# Patient Record
Sex: Female | Born: 1945 | Race: White | Hispanic: No | Smoking: Former smoker
Health system: Southern US, Community
[De-identification: ages and names within clinical notes are randomized; demographics above are authoritative.]

## PROBLEM LIST (undated history)

## (undated) DIAGNOSIS — I313 Pericardial effusion (noninflammatory): Secondary | ICD-10-CM

## (undated) DIAGNOSIS — E785 Hyperlipidemia, unspecified: Secondary | ICD-10-CM

## (undated) DIAGNOSIS — I3139 Other pericardial effusion (noninflammatory): Secondary | ICD-10-CM

## (undated) DIAGNOSIS — A419 Sepsis, unspecified organism: Secondary | ICD-10-CM

## (undated) DIAGNOSIS — I5032 Chronic diastolic (congestive) heart failure: Secondary | ICD-10-CM

## (undated) DIAGNOSIS — G894 Chronic pain syndrome: Secondary | ICD-10-CM

## (undated) DIAGNOSIS — G8250 Quadriplegia, unspecified: Secondary | ICD-10-CM

## (undated) DIAGNOSIS — R251 Tremor, unspecified: Secondary | ICD-10-CM

## (undated) DIAGNOSIS — E119 Type 2 diabetes mellitus without complications: Secondary | ICD-10-CM

## (undated) DIAGNOSIS — Z87440 Personal history of urinary (tract) infections: Secondary | ICD-10-CM

## (undated) DIAGNOSIS — I4891 Unspecified atrial fibrillation: Secondary | ICD-10-CM

## (undated) DIAGNOSIS — Z8619 Personal history of other infectious and parasitic diseases: Secondary | ICD-10-CM

## (undated) DIAGNOSIS — R918 Other nonspecific abnormal finding of lung field: Secondary | ICD-10-CM

## (undated) DIAGNOSIS — I10 Essential (primary) hypertension: Secondary | ICD-10-CM

## (undated) DIAGNOSIS — L89899 Pressure ulcer of other site, unspecified stage: Secondary | ICD-10-CM

## (undated) DIAGNOSIS — K219 Gastro-esophageal reflux disease without esophagitis: Secondary | ICD-10-CM

## (undated) HISTORY — PX: ABDOMINAL HYSTERECTOMY: SHX81

---

## 2003-11-15 ENCOUNTER — Encounter: Admission: RE | Admit: 2003-11-15 | Discharge: 2004-02-13 | Payer: Self-pay | Admitting: Emergency Medicine

## 2003-12-12 ENCOUNTER — Inpatient Hospital Stay (HOSPITAL_COMMUNITY): Admission: EM | Admit: 2003-12-12 | Discharge: 2003-12-17 | Payer: Self-pay | Admitting: *Deleted

## 2004-03-06 ENCOUNTER — Encounter: Admission: RE | Admit: 2004-03-06 | Discharge: 2004-06-04 | Payer: Self-pay | Admitting: Emergency Medicine

## 2004-06-25 ENCOUNTER — Encounter: Admission: RE | Admit: 2004-06-25 | Discharge: 2004-07-25 | Payer: Self-pay | Admitting: Emergency Medicine

## 2005-06-17 ENCOUNTER — Encounter: Admission: RE | Admit: 2005-06-17 | Discharge: 2005-09-15 | Payer: Self-pay | Admitting: Family Medicine

## 2007-03-13 ENCOUNTER — Inpatient Hospital Stay (HOSPITAL_COMMUNITY): Admission: EM | Admit: 2007-03-13 | Discharge: 2007-03-18 | Payer: Self-pay | Admitting: Emergency Medicine

## 2007-03-13 ENCOUNTER — Ambulatory Visit: Payer: Self-pay | Admitting: *Deleted

## 2007-08-11 ENCOUNTER — Encounter (HOSPITAL_BASED_OUTPATIENT_CLINIC_OR_DEPARTMENT_OTHER): Admission: RE | Admit: 2007-08-11 | Discharge: 2007-11-08 | Payer: Self-pay | Admitting: Internal Medicine

## 2010-01-08 ENCOUNTER — Encounter: Admission: RE | Admit: 2010-01-08 | Discharge: 2010-04-03 | Payer: Self-pay | Admitting: Family Medicine

## 2010-06-25 ENCOUNTER — Encounter: Admit: 2010-06-25 | Payer: Self-pay | Admitting: Family Medicine

## 2010-11-19 NOTE — Assessment & Plan Note (Signed)
[facility]   NAME:  [name] [name], [name] [name]                ACCOUNT NO.:  [id number]   MEDICAL RECORD NO.:  [id number]      DATE OF BIRTH:  [birth year]   PHYSICIAN:  Theresia Majors. Tanda Rockers, M.D.      VISIT DATE:                                   OFFICE VISIT   REASON FOR CONSULTATION:  [name] [name] is a 65 year old lady referred by  Dr. Wonda Horner from [facility] for evaluation of bilateral  fifth toe ulcerations.   IMPRESSION:  Bilateral neuropathic ulcers.   RECOMMENDATIONS:  Topical Cadexomer Iodine followed by fitting for  custom orthotics for inserts and shoes.   SUBJECTIVE:  [name] [name] is a 65 year old lady who reports that she has  had progressive ulcerations over the fifth digits of both feet for the  past 2 months.  She has treated them with local antibiotics and  antiseptic soap wash.  She has never worn custom orthotics.  She has  never worn healing sandals.  Two weeks ago she changed her wound  management to use an aloe vera juice and has noted some improvement.  There has been no interim fever.  There has been no malodorous drainage.  She continues to be ambulatory.   ALLERGIES:  No known drug allergies.   MEDICATIONS:  Her current medication list includes:  Neurontin 300 mg  twice daily, baclofen 20 mg t.i.d. Dantrium 25 mg,  Keflex XR 75 mg 3  tablets at night, lorazepam 1 mg q.i.d., Premarin, OxyContin 40 mg four  b.i.d. and OxyIR 5 mg two p.o. q.6 h p.r.n. for breakthrough pain,  spirolactone 25 mg two b.i.d., Centrum Silver daily torsemide 100 mg 1/2  daily, aspirin 81 mg daily, potassium chloride 20 mEq t.i.d., Zaroxolyn  5 mg in the morning,  Mag-Ox 40 mg b.i.d. and Zetia 10 mg h.s.   PAST SURGICAL HISTORY:  Her previous surgery has included cervical  fusion in 1993, hysterectomy 29 years ago and appendectomy in 1994.   FAMILY HISTORY:  Her family history is unknown. The patient was adopted.   SOCIAL HISTORY:  She is medically disabled.  She  used to be employed as  an Management consultant.   REVIEW OF SYSTEMS:  On review of systems she reports significant anxiety  and spasticity related to a previous spinal cord injury.  She has  chronic pain related to her previous trauma.  She specifically denies  transient visual loss or upper extremity paralysis or stigmata of TIAs.  She is a reformed smoker.  She denies a chronic cough or hemoptysis.  She  specifically denies angina pectoris. Her bowel and bladder function  are unremarkable.  Her weight has been stable.  Appetite is good.  There  is no polydipsia, polyuria or polyphagia. The remainder of the review of  systems is negative.   PHYSICAL EXAMINATION:  GENERAL:  On physical exam she is an alert,  oriented female verbally spontaneous in no acute distress.  VITAL SIGNS:  Blood pressure is 127/68, respirations 16, pulse rate 80,  temperature is 98.2  HEENT: Examination  is clear.  NECK:  The neck is supple.  Trachea is midline.  Thyroid is nonpalpable.  LUNGS:  Clear.  HEART:  Heart sounds were distant.  ABDOMEN:  Abdomen is soft without masses.  HEART:  The femoral pulses are 2+ the pedal pulses are 3+.  There is  trace edema. At the distal interphalangeal joint of the fifth digits  bilaterally are abrasive appearing ulcerations which extend down into  the subcutaneous tissue.  There is no joint exposed only the wound.  There is no drainage.  There is no evidence of a ascending infection,  cellulitis or abscess formation.  Both wounds were photographed,  measured, and cataloged.  Please refer to the data entries. The patient  is absolutely insensate to the wine stain filament.  She has decreased  motor ability in the lower extremities with weakness in the major  abductors and the adductors.  There is some weakness in extension of the  lower extremity.  There is weakness in the extension and flexion of the  ankle.   DISCUSSION:  [name] [name] physical exam is consistent  with significant  neurological deficits in the lower extremity.  She is particularly  insensate in the areas corresponding to the ulcerations.  We have  explained the etiology of her ulcers as definitely related to the  insensate nature of her lower extremities.  We have recommended custom  orthotics.  In the interim, we will treat her with Cadexomer iodine  compound to avoid secondary infection.  We have encouraged her to  continue her level of activity.  We have also asked her not to wear  deterrent shoes, but to wear the healing sandals which we have fitted  her for in the clinic.  We have given an opportunity to ask questions.  She seems to understand and indicates that she will be compliant.  We  will reevaluate her in 1 week      Harold A. Tanda Rockers, M.D.  Electronically Signed     HAN/MEDQ  D:  08/18/2007  T:  08/20/2007  Job:  [id number]   cc:   Volanda Napoleon, MD

## 2010-11-19 NOTE — Assessment & Plan Note (Signed)
[facility]   NAME:  KIMANI, BEDOYA                ACCOUNT NO.:  [id number]   MEDICAL RECORD NO.:  [id number]      DATE OF BIRTH:  29-Jun-1946   PHYSICIAN:  Theresia Majors. Tanda Rockers, M.D. VISIT DATE:  08/26/2007                                   OFFICE VISIT   SUBJECTIVE:  [name] [name] is a 65 year old lady who we have followed for  neuropathic ulcers.  In the interim we have treated her with offloading  healing sandals and topical our sore.  She reports that there is been no  excessive drainage or malodor.  She continues to go barefoot in her  home.  There has been no interim injury.   EXAM:  Blood pressure is 142/71, respirations 18, pulse rate 96,  temperature 98.  Inspection of the feet shows that the ulcers have contracted nicely.  There is no exudate.  There are areas of impacted Iodosorb the at the  erythema has decreased markedly.   ASSESSMENT:  Clinical improvement with offloading and the use of topical  our sore.   PLAN:  We will continue the our sort topical ointment and offloading.  We will reevaluate the patient in 3 weeks.  We have emphasized the need  to wear protective foot wear.  We have given the patient an opportunity  to ask questions.  She seems to understand indicates that she will be  compliant.      Harold A. Tanda Rockers, M.D.  Electronically Signed     HAN/MEDQ  D:  08/26/2007  T:  08/27/2007  Job:  [id number]

## 2010-11-19 NOTE — Assessment & Plan Note (Signed)
[facility]   NAME:  [name] [name], [name] [name]                ACCOUNT NO.:  [id number]   MEDICAL RECORD NO.:  [id number]      DATE OF BIRTH:  [birth year]   PHYSICIAN:  Theresia Majors. Tanda Rockers, M.D. VISIT DATE:  10/21/2007                                   OFFICE VISIT   SUBJECTIVE:  [name] is a 65 year old lady with bilateral neuropathic  ulcers involving the feet.  In the interim, she has procured custom  orthotics and shoes.  She has not worn the shoes in her followup  evaluation.  She continues to wear the healing sandal and apply Iodosorb  every 3 days to the residual wounds.  There has been no drainage.  There  has been no fever or pain.   OBJECTIVE:  Blood pressure is 131/75, respirations 16, pulse rate 85,  and temperature is 98.1.  Inspection of the lower extremities shows that  there is no edema.  The pedal pulse remains readily palpable.  On the  left foot at the fifth lateral toe is a minuscule punctate area with no  drainage.  There is no extension into the subcutaneous tissue.  There is  some moderate amount of desquamation, but no debridement is needed.  Over the right fifth lateral toe is a similar appearing wound with a  dry, well-adherent eschar.  There is no evidence of infection or  ischemia.   ASSESSMENT:  Improved neuropathic ulcers.   PLAN:  We have instructed the patient to begin using her custom  orthotics with the shoes.  She is to wear them for increasing periods  with interval inspection of the foot to rule out injury.  If she notices  any injury, she is to discontinue the shoe and return to the healing  sandals, call the clinic as well as the orthotists for an interim  appointment.  We have given the patient the opportunity to ask  questions.  She seems to understand these instructions and indicates  that she will be compliant.  We will re-evaluate her in 2 weeks.      Harold A. Tanda Rockers, M.D.  Electronically Signed     HAN/MEDQ  D:   10/21/2007  T:  10/22/2007  Job:  [id number]

## 2010-11-19 NOTE — Assessment & Plan Note (Signed)
[facility]   NAME:  [name] [name], [name] [name]                ACCOUNT NO.:  [id number]   MEDICAL RECORD NO.:  [id number]      DATE OF BIRTH:  1946/03/05   PHYSICIAN:  Jonelle Sports. Sevier, M.D.  VISIT DATE:  10/06/2007                                   OFFICE VISIT   HISTORY:  This 65 year old white female with a spinal cord injury and  neuropathic feet has been followed for footwear-induced ulcerations of  the dorsolateral fifth toes bilaterally.  These have shown progressive  improvement, and she has most recently been managed simply with daily  cleansing and with every 3-day application of Iodosorb ointment and has  remained in healing sandals bilaterally.   She reports that since the last visit she has had no significant pain,  no drainage, no odor, no swelling, no fever or systemic symptoms.   Blood pressure 118/67, pulse 74, respirations 16, temperature 97.7.  The  wound on the right fifth toe dorsolaterally measures 0.7 x 0.2 x 0.2 cm  in dimension and appears to be satisfactorily healing from its base with  no evidence of infection or other problems.   The wound on the dorsolateral aspect of the left fifth toe is scabbed  over and measures 0.3 x 0.3 x 0.1 cm.  There is no spreading  inflammation or any signs of problems beneath this eschar.   IMPRESSION:  Progressive improvement, bilateral neuropathic foot ulcers.   DISPOSITION:  The wounds require no debridement today.  The patient is  instructed to continue her current regimen of care, namely cleansing  daily and every 3 days to make an application of Iodosorb ointment.  She  is to remain in the healing sandals bilaterally at all times.   The patient has had custom shoes prepared by [employer], and we will make a  call to them today to see if they can possibly release those to her  prior to her next visit here so that we may assess them here at that  time.  That visit will be 2 weeks hence.     ______________________________  Jonelle Sports. Cheryll Cockayne, M.D.     RES/MEDQ  D:  10/06/2007  T:  10/06/2007  Job:  [id number]

## 2010-11-19 NOTE — Assessment & Plan Note (Signed)
[facility]   NAME:  [name] [name]                ACCOUNT NO.:  [id number]   MEDICAL RECORD NO.:  [id number]      DATE OF BIRTH:  06-12-1946   PHYSICIAN:  Theresia Majors. Tanda Rockers, M.D. VISIT DATE:  09/16/2007                                   OFFICE VISIT   SUBJECTIVE:  [name] [name] returns for followup of neuropathic ulcers  involving both lower extremities.  She has been applying topical  Iodosorb every 2-3 days up and wearing white socks with offloading  healing sandals.  There has been no excessive drainage, malodor, pain or  fever.   OBJECTIVE:  VITAL SIGNS:  Blood pressure is 119/74, respirations 16,  pulse rate 91, temperature is 98.4.  EXTREMITIES:  Inspection of the lower extremity shows that there has  been contraction of both wounds #1 and #2.  There is no drainage.  The  scabs are well adherent and there has been definite contraction of the  wounds.  The pedal pulses remain palpable.  There is no evidence of  ascending infection.   ASSESSMENT:  Clinical improvement.   PLAN:  We have given the patient a prescription for extra-depth shoes  and inserts.  She also has been referred to the orthotist for fitting of  the same.  She has been encouraged to continue to apply the Iodosorb  every 3-4 days and continue wearing the healing sandal.  We will re-  evaluate her in 2 weeks.  We anticipate that she will have procured her  custom orthotics at her next visit.  We have given the patient  opportunity to ask questions.  She seems to understand the instructions  and indicates that she will be compliant. /      Theresia Majors. Tanda Rockers, M.D.  Electronically Signed     HAN/MEDQ  D:  09/16/2007  T:  09/17/2007  Job:  [id number]

## 2010-11-19 NOTE — Assessment & Plan Note (Signed)
[facility]   NAME:  [name] [name], [name] [name]                ACCOUNT NO.:  [id number]   MEDICAL RECORD NO.:  [id number]      DATE OF BIRTH:  10/08/1945   PHYSICIAN:  Theresia Majors. Tanda Rockers, M.D. VISIT DATE:  11/04/2007                                   OFFICE VISIT   SUBJECTIVE:  [name] [name] is a 65 year old lady who we have seen for  several weeks for the management of neuropathic ulcers involving her  feet.  In the interim, she has been treated with compression topical  Iodosorb and most recently she has received custom inserts to shoes.  She has been wearing though shoes without incident.  There has been no  drainage or pain.  She returns for followup.   OBJECTIVE:  VITAL SIGNS:  Blood pressure is 134/65, respirations 16,  pulse rate 82, temperature 98.5.  She is accompanied by her sister.  Inspection of the lower extremity shows trace edema.  The areas of  previous ulceration have completely re-epithelialized and/or covered by  firm adherent eschars.  There is no evidence of undue pressure.  The  pedal pulses remain palpable.  The patient has lost her protective  sensation is judged by the Semmes-Weinstein monofilament.  There is no  evidence of acute infection.   ASSESSMENT:  Resolved neuropathic ulcers.   PLAN:  The patient should continue the wear of the custom orthotics in  shoes.  We have recommended strongly that she develop a relationship  with a local podiatrist for routine foot care.  We have given her an  opportunity to ask questions.  She seems to understand these  instructions and indicates that she will be compliant.  She is  discharged from active management via the wound center and encouraged to  make her routine appointments with her primary care physician Dr. Wonda Horner, of [facility].      Harold A. Tanda Rockers, M.D.  Electronically Signed     HAN/MEDQ  D:  11/04/2007  T:  11/05/2007  Job:  [id number]

## 2010-11-22 NOTE — Discharge Summary (Signed)
NAMEJERMIAH, [name] [name]                ACCOUNT NO.:  [id number]   MEDICAL RECORD NO.:  [id number]          PATIENT TYPE:  INP   LOCATION:  5025                         FACILITY:  [facility]   PHYSICIAN:  Valetta Close, M.D.   DATE OF BIRTH:  [birth year]   DATE OF ADMISSION:  03/13/2007  DATE OF DISCHARGE:  03/18/2007                               DISCHARGE SUMMARY   Date of Admission 03/13/07  Date of Discharge 03/18/07  Attending Physician Dr. Lyda Perone   DISCHARGE DIAGNOSIS:  Urinary tract infection that caused her  disorientation and change in mental status.  Extensive back surgeries up to 7,  hypertension,  anxiety,  depression,  COPD,  hyperlipidemia  chronic lower extremity edema.  Hypomagnesemia  Chronic Pain  Oxycontin Dependence with opioid withdrawal during hospitalization  Hypokalemia  Hypophophatemia   DISCHARGE MEDICATIONS:  1. Aspirin 81 mg po qd.  2. Magnesium oxide 400 mg p.o. b.i.d.  3. Effexor XR 75 mg once at bedtime.  4. Lorazepam 1 mg x4 daily.  5. Baclofen 20 mg t.i.d.  6. OxyContin IR 100 mg po b.i.d.  7. Oxycodone 10mg  po q 6 hours prn pain   Disposition at Discharge  She was instructed to followup with her PCP Dr. Remer Macho to work up her  LFTs since they were not terribly elevated and she was asymptomatic.  The patient was told to have her PCP check for autoimmune causes with an  antismooth muscle and also get a transferrin saturation level in an  effort to further work up these elevated LFTs.  Concerning her Oxycontin dependence, she is wanting to decrease her  physical and psychological dependence on oxycontin and she was provided  information on addiction programs.  She can also follow up on this with  Dr. Remer Macho.  [name] [name] has never abused her medicine, she is just got  to the point where she is on a very high dose and she would like to  break her reliance on oxycontin.  Her electrolyte deficiencies were a problem during her hospitalization.  Off of her  diuretics her blood pressure was okay, and her edema remained  at baseline.  She is discharged off of her diuretics and can follow up  on her electrolytes with Dr. Remer Macho.   HISTORY OF PRESENT ILLNESS:  Additional acute problems upon admission  were chronically low potassium, hypertension, elevated LFTs,  hyperlipidemia, anxiety, chronic back pain.  The patient was discharged  on aspirin 81 mg p.o. once daily, magnesium oxide 400 mg p.o. b.i.d.,  Lorazepam 1 mg once every 6 hours as needed for anxiety, Effexor 25 mg  p.o. once at night.  This is a new lower dose.  Baclofen 20 mg p.o. 3  times daily, OxyContin 100 mg p.o. twice daily, Oxycodone 10 mg by mouth  every 6 hours as needed for break through pain.  The patient was told to  make an appointment with her primary care physician, Dr. Benancio Deeds of  [place], [place] within 1 to 2 weeks.  Dr. Benancio Deeds was called by  internal medicine but was never successfully reached.  On  this  appointment the patient was instructed to followup on her fasting lipid  panel, on her elevated LFTs.  She was told she required a comprehensive  metabolic panel including liver functions, potassium, magnesium and  phosphorus.  The patient was instructed that she needed her  hypophosphatemia worked up by her PCP.  The patient was also told at  this appointment that she needed her transferrin and smooth muscle  antibodies checked to rule out hemachromatosis and any autoimmune reason  for her elevated LFTs.  Upon admission the patient had a head CT without  contrast that was negative.  A chest x-ray that was also negative.  During the course of her stay she had a subsequent chest x-ray to check  for any infiltrates or infection when she spiked a fever.  The chest x-  ray was negative.  The patient also received a CT with contrast of her  abdomen and pelvis to investigate her elevated LFTs.  The patient also  received an ultrasound of her abdomen.  Her ultrasound  was normal.  Her  CT with contrast showed small possible liver cysts, judged by radiology  to be noncontributory.  No pancreatic inflammation was seen on CT.  The  patient was admitted on March 13, 2007 per her son with altered  mental status.  The patient was last seen with normal mentation on the  day prior to admission.  She was noted to be very sleepy and slept most  of the day prior to admission.  When her son encountered her on the day  of admission around 11:00 a.m. she seemed very confused and became  increasingly altered over the next few hours.  Her son called EMS after  his suggestion of going to the hospital was met with the comment that  she feared she would die if she went to the hospital.  The patient's son  did recall that she tripped and fell on September 2 or 3, 2008 but  denies any blows to the head.   PHYSICAL EXAMINATION:  VITAL SIGNS:  Temperature of 98.4, blood pressure  of 108/91, pulse of 99, respiratory rate 22, saturation 98% on 2 liters  of oxygen.  GENERAL:  The patient was very disoriented with occasional full body  twitches and complaining of things in her legs on admission.  HEENT:  Eyes were PERRLA and nonicteric.  Head showed no evidence of  trauma.  NECK:  No masses are palpated in the neck.  LUNGS:  The patient was uncooperative for a respiratory exam, though her  breath sounds in her upper lobes bilaterally were clear.  HEART:  The patient had a regular rate and rhythm.  No murmurs, rubs or  gallops appreciated.  ABDOMEN:  She had positive bowel sounds.  Nontender and nondistended  with no CVA tenderness.  EXTREMITIES:  The patient had no peripheral edema.  SKIN:  No rashes.  NEURO:  The patient was alert and oriented to self only.  Uncooperative  and unable to test cranial nerves or strength.  PSYCH:  The patient was hallucinating and occasionally combative.   ADMISSION LABS:  Sodium 136, potassium of 2.7. chloride of 101, bicarb  of 26, BUN  of 16 and a glucose of 99.  The patient's hemoglobin on  admission was 15.3 and hematocrit was 48.  For a more detailed H&P please refer to the chart   HOSPITAL COURSE BY SYSTEM:  1. Altered mental status.  The patient's UA was positive for  nitrites      and many bacteria.  She was begun on Cipro 400 mg IV b.i.d. to      treat a UTI.  UDS was positive for benzos and opiates, likely      administered in the ED and also to be accounted for by her home      drug regimen.  CT without contrast of the head was negative.  Her      chest x-ray was unremarkable.  Her blood alcohol level was      negative.  Her TSH was within normal limits.  RPR was negative.      B12 was 1081, actually above normal limits.  Her folate was greater      than 20 which was within normal limits.  Her acetaminophen level      was 18.2 and her ESR was normal.  With treatment of her UTI her AMS      cleared dramatically and rapidly, and thus with her other work up      being negative, her AMS was attributed to her UTI  2. Her UTI was judged to be responsible for her altered mental status      based on her altered mental status resolving upon treatment of the      UTI.  It returned senstive to Cipro, and because of her complicated      course, she was maintained on Cipro during her hospitalization,      with a switch to PO when she her AMS resolved, and continued for a      total of 14 days on d/c.  3. Low potassium.  We were informed that this is a chronic problem for      the patient.  During our stay with [place] she was regularly repleted      with K-Dur as well as magnesium oxide.  She maintained normal      levels.  Further work up was suggested as an outpatient with her      PCP.  We did however decide to stop her diuretics as they were also      likely involved with her low electrolytes.  4. Hypertension.  We held all of the patient's hypertension      medications.  She maintained a stable BP, during her entire       hospital course.  We did not restart any of her diuretics and left      that up to her PCP.  I would favor not being quick to restart her      diuretic medications.  5. Hyperlipidemia.  The patient's fasting lipid panel showed a      triglyceride level of 152, HDL level of 32 and an LDL of 124.      These were forwarded on to her PCP and the patient was instructed      to followup with her primary care physician about possible start of      a statin or some other cholesterol regimen.  With her mild      elevation in LFTs, we did not rush to start her on statins while in      the hospital.  6. Pain control.  The patient was begun on admission on a very minimal      dose of OxyContin 20 mg twice a day, beginning on the evening of      March 15, 2007 and continuing to the day of March 16, 2007.  The patient began to suffer nausea, vomiting and spiked a fever.      We found out that her home dose of OxyContin was 160 mg b.i.d.  Her      OxyContin dose was increased to 100 mg b.i.d.  Nausea, vomiting and      fever all resolved.  Blood cultures in regard to this fever were      taken and all came back negative.  A urine culture for this fever      was taken and also came back negative.  After the change in her      OxyContin dose, the patient said that her pain control was      adequate.  She also requested information on rehab program feeling      that she was psychologically and physically dependent on her      OxyContin dose having been on such a high dose for so long.  Social      work did see the patient and gave her information on this.  7. Elevated LFTs.  The day after admission, which would be September      7, the patient's AFT was 72 and ALT was 48.  These continued to      escalate so that on the day of her discharge, September 11, her AFT      was 69 and her ALT was 86.  It working up these elevated liver      function tests the patient and her family were repeatedly  asked      about any possible alcohol use and in every incident alcohol use      was denied.  Her alcohol blood level on admission was negative.  On      September 10, the patient's belly seemed very swollen possibly with      fluid.  A hepatitis panel was run.  She was negative for hep A, B      and C.  An ultrasound of her belly was done on September 11, that      came back normal.  A CT scan with contrast was done to evaluate her      liver, pancreas and also any possible gynecological sources for      her.  Her CT with contrast was normal with small liver cysts judged      by radiology to be noncontributory.  No pancreatic inflammation      seen.  The patient had had a hysterectomy.  This was      noncontributory so pelvic was negative.  By September 11, the      patient's abdomen had returned to normal size per her.  This should      be followed up by her PCP for potential immunological causes of      liver disease.   LABORATORY DATA:  On the day of discharge the patient's labs were sodium  142, potassium 3.2, chloride of 106, bicarb of 25, BUN of 10, creatinine  of 0.92 and a glucose of 82.  White blood cell count was 11.5,  hemoglobin was 14, hematocrit was 41.8 with platelets of 236.  Total  bilirubin of 0.6, alk phos of 88, AST of 74 and ALT of 86.  Total  protein is 6.3, albumin is 3.4, calcium of 8.5 and a lipase of 127.   VITAL SIGNS:  The patient's vitals on the day of discharge were  temperature of 99, blood pressure was 129/68,  pulse of 58, respirations  of 20, saturation 98% on room air.      Valetta Close, M.D.  Electronically Signed     JC/MEDQ  D:  03/23/2007  T:  03/23/2007  Job:  [id number]

## 2010-11-22 NOTE — H&P (Signed)
[name] [name], [name] [name]                            ACCOUNT NO.:  [id number]   MEDICAL RECORD NO.:  [id number]                   PATIENT TYPE:  INP   LOCATION:  5714                                 FACILITY:  [facility]   PHYSICIAN:  Burnice Logan, M.D.               DATE OF BIRTH:  April 14, 1946   DATE OF ADMISSION:  12/11/2003  DATE OF DISCHARGE:                                HISTORY & PHYSICAL   CHIEF COMPLAINT:  Shortness of breath over a week.   HISTORY OF PRESENT ILLNESS:  The patient is a 65 year old Caucasian lady who  has had difficulty breathing for about a week. She was just treated with a Z-  Pack course by her primary physician. She completed her medications today.  She continues to have significant dyspnea with wheezing. She also has  productive cough. Her sputum was initially colored but is starting to clear  up. She denies any hemoptysis. She has had a fever with chills at home. She  was treated with albuterol inhalers and 60 mg of prednisone p.o. in the  emergency room but continued to wheeze. Her O2 saturations are in the 90s. I  was asked to evaluate her for admission because of her ongoing respiratory  complaints.   PAST MEDICAL HISTORY:  1. History of bronchitis and pneumonia. She has never been on inhalers and     has never been told of any history of emphysema.  2. Hypertension.  3. Hyperlipidemia.  4. History of hypokalemia.  5. History of fall with back injury eight years ago. She has had seven back     surgeries.  6. Status post appendectomy.  7. Status post hysterectomy and oophorectomy.   ALLERGIES:  DECADRON. She developed what appears to be urticaria.   MEDICATIONS:  Spironolactone, potassium, chloride, baclofen, diltiazem,  oxycodone, Dantrium, furosemide, and Effexor. Doses are not specified.   FAMILY HISTORY:  The patient was adopted as a child and does not know  details of her family history.   SOCIAL HISTORY:  She currently lives in [place] with  her son. She is a  one pack a day smoker and has been smoking for greater than 20 years. She  denies use of alcohol.   REVIEW OF SYSTEMS:  Subjective fever with chills. Coughing with occasional  vomiting. No hemoptysis or hematemesis. She had diarrhea after starting her  Z-Pack course. Denies abdominal pain. She has no urinary complaints. She has  chronic back pain. She ambulates with a cane. All other systems negative.   PHYSICAL EXAMINATION:  GENERAL:  This is a middle-aged Caucasian lady,  average size, in no acute distress.  VITAL SIGNS:  Temperature 97, blood pressure 157/59, heart rate 89,  respiratory rate 18 per minute.  HEENT:  Normocephalic. No facial asymmetry. Normal eyelids, conjunctivae,  and mucous membranes. Pupils are equal in size. Normal external eye  movements.  NECK:  Supple. No thyroid  masses felt. No JVD.  LUNGS:  Widespread bilateral coarse rhonchi with diminished air movement.  CARDIOVASCULAR:  Heart sounds 1 and 2 are heard, regular, no murmurs.  ABDOMEN:  Soft and nontender. Normal bowel sounds. No organomegaly.  CENTRAL NERVOUS SYSTEM:  Alert and oriented x3. Cranial nerves II-XII  grossly intact. No focal neurological deficits.  EXTREMITIES:  No pedal edema.   LABORATORY DATA:  Chest x-ray shows increased lung markings with no  infiltrates. White count is 17,000, hemoglobin 14, hematocrit 41, platelet  count 508. ABG:  PH 7.4, pCO2 35, pO2 60, bicarb 24, saturations 92%.   ASSESSMENT/PLAN:  1. Chronic obstructive pulmonary disease exacerbation. The patient has had     only modest response to albuterol treatments here in the emergency room.     She maintains good O2 saturations, but her CO2 remains low. She will be     admitted for continued oxygen support and frequent bronchodilator aerosol     treatments. I also put her on systemic steroids. Note is made of her     previous Decadron allergy, but she has taken oral prednisone with no     adverse  effects. Will give her a stat dose of Solu-Medrol and if she     tolerates continue it on a 12-hour schedule for her bronchospasm.  2. Leukocytosis, likely related to stress versus respiratory infection. The     patient has completed a course of Z-Pack. She does not have any definite     infiltrates on her x-ray but cannot exclude acute bronchitis contributing     to her symptoms. We will continue coverage with broad-spectrum quinolone.  3. Hypertension. This is stable. Will need to continue her usual medications     once we establish her doses.  4. Chronic back pain. Will use analgesics as needed.  5. DVT prophylaxis with low molecular weight heparin.  6. The patient was consulted on smoking cessation.                                                Burnice Logan, M.D.    ES/MEDQ  D:  12/11/2003  T:  12/12/2003  Job:  [id number]

## 2010-11-26 NOTE — Discharge Summary (Signed)
[name] [name], [name] [name]                            ACCOUNT NO.:  [id number]   MEDICAL RECORD NO.:  [id number]                   PATIENT TYPE:  INP   LOCATION:  5714                                 FACILITY:  [facility]   PHYSICIAN:  Elliot Cousin, M.D.                 DATE OF BIRTH:  10-17-1945   DATE OF ADMISSION:  12/11/2003  DATE OF DISCHARGE:  12/17/2003                                 DISCHARGE SUMMARY   DISCHARGE DIAGNOSES:  1. Acute bronchitis exacerbation (acute COPD exacerbation).  2. Leukocytosis, thought to be secondary to steroids.  3. Hyperglycemia, thought to be secondary to steroids.  4. Mild oral thrush.   SECONDARY DISCHARGE DIAGNOSES:  1. History of bronchitis and pneumonia in the past.  2. Hypertension.  3. Hyperlipidemia.  4. History of hypokalemia.  5. History of back injury eight years ago, status post seven back surgeries.     The patient has resultant mild generalized weakness in her legs and     severe spasms requiring multiple medications.  6. Chronic bilateral lower extremity edema.  7. Depression with anxiety.  8. Status post appendectomy in the past.  9. Status post hysterectomy and oophorectomy in the past.  10.      Allergy to Decadron.  11.      Tobacco abuse.   DISCHARGE MEDICATIONS:  1. Prednisone Dosepak 10 mg six-day course to take as directed.  2. Avelox 400 mg q.d. for three more days.  3. Combivent MDI two puffs q.6h. as needed for wheeze.  4. Advair discus 1 inhalation b.i.d. until complete.  5. OxyContin 160 mg q.12h.  6. Oxycodone 5 mg q.4h. as needed for pain.  7. Spirolactone 25 mg q.i.d.  8. Lorazepam 1 mg q.i.d. p.r.n.  9. Lasix 80 mg q.d.  10.      Baclofen 30 mg q.i.d.  11.      Diltiazem XR 240 mg q.d.  12.      Dantrium 25 mg q.i.d.  13.      Effexor XR 75 mg b.i.d.  14.      Premarin 1.25 mg q.d.  15.      Potassium chloride 20 mEq 8 tablets q.d., or as previously ordered     by Dr. Benancio Deeds.  16.      Centrum Silver 1 q.d.  17.      Neurontin 300 mg t.i.d.  18.      Crestor 10 mg q.h.s.  19.      Magnesium oxide 400 mg q.d.  20.      Percocet as needed.   DISCHARGE DISPOSITION:  The patient was discharged to home in improved and  stable condition on December 17, 2003.  She was advised to follow up with her  primary care physician, Dr. Benancio Deeds, in five to seven days.   HISTORY OF PRESENT ILLNESS:  The patient is a 65 year old lady with  a past  medical history significant for bronchitis and pneumonia, who presented to  the hospital on December 11, 2003, with difficulty breathing which started  approximately one week ago.  The patient was recently treated by her primary  care physician with Z-pac.  After completing the Z-pac she continued to have  significant shortness of breath and wheezing.  She also had a productive  cough.  She had subjective fever and chills at home.  The patient was,  therefore, admitted for further treatment.   HOSPITAL COURSE:  ACUTE CHRONIC OBSTRUCTIVE PULMONARY DISEASE EXACERBATION.  The initial treatment started in the emergency department when the patient  was treated with continuous Albuterol and Atrovent nebulizers and prednisone  60 mg times one.  She was also placed on oxygen titrated to keep her oxygen  saturation greater than 90%.  The patient's chest x-ray revealed bilateral  interstitial prominence, chronic versus acute.  The radiologist believes  that the chest x-ray findings may represent chronic bronchitic changes or  diffuse interstitial pneumonitis.  The patient's white blood cell count on  admission was 17.2 with an absolute neutrophil count of 15.4.  It is not  clear whether the leukocytosis was secondary to the prednisone received in  the emergency department or acute infectious bronchitis.  The patient was  afebrile on admission.   The secondary management included Solu-Medrol 125 mg q.12h. which was  tapered during the hospital course.  She was also started on Avelox  400 mg  IV daily empirically.  She was treated with Tessalon Perles 100 mg b.i.d.,  Guaifenesin 15 cc q.6h. as needed, Advair discus b.i.d. and Albuterol and  Atrovent nebulizers.  The patient responded well to all of the treatments  given.  The Solu-Medrol was eventually tapered to 60 mg q.12h. and then  tapered off.  The patient was started on prednisone 60 mg q.d.  The  Albuterol and Atrovent nebulizers were discontinued and the patient was  started on treatment with Combivair q.6h. as needed.  The patient's  capillary blood sugars became mildly elevated, which was secondary to the  steroids.  The hyperglycemia was covered with a sliding scale insulin  regimen.  The patient's follow up white blood cell count was 24.7.  It is  believed that the leukocytosis is secondary to the steroid treatment.  The  patient is totally afebrile now.  Her lungs are completely clear at the time  of this dictation and, therefore, she will be discharged to home.  The  patient will be discharged on an additional three days of Avelox at 400 mg  daily for a total antibiotic course of ten days.  She will be sent home on a  prednisone dose taper over the next six days.  The patient was admonished to  not smoke.  The patient did develop a small amount of white exudate in her  oropharynx.  She was treated with Nystatin swish and swallow and given  Diflucan 150 mg p.o. times one.  The patient was advised to follow up with  her primary care physician, Dr. Benancio Deeds, in [place], [place], in five to  seven days.                                                Elliot Cousin, M.D.    DF/MEDQ  D:  12/17/2003  T:  12/17/2003  Job:  [id number]   cc:   [name], M.D.  [facility]

## 2011-04-18 LAB — URINE MICROSCOPIC-ADD ON

## 2011-04-18 LAB — COMPREHENSIVE METABOLIC PANEL
ALT: 48 — ABNORMAL HIGH
ALT: 49 — ABNORMAL HIGH
ALT: 86 — ABNORMAL HIGH
AST: 50 — ABNORMAL HIGH
Albumin: 3.9
Albumin: 4.1
Albumin: 4.1
Alkaline Phosphatase: 78
Alkaline Phosphatase: 81
Alkaline Phosphatase: 84
Alkaline Phosphatase: 88
BUN: 12
CO2: 23
CO2: 25
CO2: 27
CO2: 28
Calcium: 8 — ABNORMAL LOW
Calcium: 9.4
Chloride: 105
Chloride: 106
Chloride: 99
Creatinine, Ser: 0.73
Creatinine, Ser: 0.74
GFR calc Af Amer: >60
GFR calc Af Amer: >60
GFR calc non Af Amer: >60
GFR calc non Af Amer: >60
Glucose, Bld: 111 — ABNORMAL HIGH
Glucose, Bld: 82
Glucose, Bld: 91
Potassium: 3.1 — ABNORMAL LOW
Potassium: 3.2 — ABNORMAL LOW
Potassium: 3.4 — ABNORMAL LOW
Sodium: 140
Sodium: 142
Sodium: 143
Total Bilirubin: 0.6
Total Bilirubin: 0.6
Total Protein: 5.5 — ABNORMAL LOW
Total Protein: 6.3
Total Protein: 7.1
Total Protein: 7.3

## 2011-04-18 LAB — URINALYSIS, ROUTINE W REFLEX MICROSCOPIC
Bilirubin Urine: NEGATIVE
Leukocytes, UA: NEGATIVE
Nitrite: POSITIVE — AB
Specific Gravity, Urine: 1.014
pH: 6.5

## 2011-04-18 LAB — BASIC METABOLIC PANEL
BUN: 10
Calcium: 9
Chloride: 99
GFR calc Af Amer: >60
GFR calc non Af Amer: >60
Potassium: 2.7 — CL
Potassium: 3.4 — ABNORMAL LOW
Sodium: 133 — ABNORMAL LOW
Sodium: 136

## 2011-04-18 LAB — I-STAT 8, (EC8 V) (CONVERTED LAB)
Acid-Base Excess: 2
Acid-Base Excess: 4 — ABNORMAL HIGH
BUN: 16
Bicarbonate: 24.2 — ABNORMAL HIGH
Bicarbonate: 25.1 — ABNORMAL HIGH
HCT: 48 — ABNORMAL HIGH
HCT: 50 — ABNORMAL HIGH
Operator id: 151321
Potassium: 2.7 — CL
Potassium: 3.3 — ABNORMAL LOW
TCO2: 25
TCO2: 26

## 2011-04-18 LAB — CBC
HCT: 41.8
HCT: 45.7
HCT: 45.8
HCT: 46.3 — ABNORMAL HIGH
Hemoglobin: 13.3
Hemoglobin: 14
Hemoglobin: 15.5 — ABNORMAL HIGH
Hemoglobin: 15.6 — ABNORMAL HIGH
Hemoglobin: 15.8 — ABNORMAL HIGH
MCHC: 33.7
MCHC: 34.1
MCV: 85.8
MCV: 87.1
MCV: 87.4
Platelets: 238
Platelets: 277
RBC: 4.47
RBC: 4.8
RBC: 5.24 — ABNORMAL HIGH
RDW: 13.4
RDW: 13.6
RDW: 13.7
RDW: 13.7
WBC: 11.5 — ABNORMAL HIGH
WBC: 6.4

## 2011-04-18 LAB — DIFFERENTIAL
Basophils Relative: 0
Basophils Relative: 0
Eosinophils Absolute: 0
Lymphocytes Relative: 14
Lymphocytes Relative: 18
Lymphocytes Relative: 40
Lymphs Abs: 1.1
Lymphs Abs: 3.8 — ABNORMAL HIGH
Monocytes Absolute: 0.5
Monocytes Relative: 9
Neutro Abs: 4.6
Neutro Abs: 4.8
Neutrophils Relative %: 50

## 2011-04-18 LAB — RAPID URINE DRUG SCREEN, HOSP PERFORMED
Barbiturates: NOT DETECTED
Opiates: POSITIVE — AB

## 2011-04-18 LAB — HEPATITIS A ANTIBODY, IGM: Hep A IgM: NEGATIVE

## 2011-04-18 LAB — HEPATITIS PANEL, ACUTE
HCV Ab: NEGATIVE
Hep A IgM: NEGATIVE

## 2011-04-18 LAB — URINE CULTURE

## 2011-04-18 LAB — CULTURE, BLOOD (ROUTINE X 2)
Culture: NO GROWTH
Culture: NO GROWTH

## 2011-04-18 LAB — FOLATE: Folate: >20

## 2011-04-18 LAB — ETHANOL: Alcohol, Ethyl (B): <5

## 2011-04-18 LAB — SEDIMENTATION RATE: Sed Rate: 1

## 2011-04-18 LAB — LIPID PANEL
Triglycerides: 152 — ABNORMAL HIGH
VLDL: 30

## 2011-04-18 LAB — TRICYCLICS SCREEN, URINE: TCA Scrn: NOT DETECTED

## 2011-04-18 LAB — RPR: RPR Ser Ql: NONREACTIVE

## 2011-04-18 LAB — VITAMIN B12: Vitamin B-12: 1081 — ABNORMAL HIGH (ref 211–911)

## 2011-04-18 LAB — ACETAMINOPHEN LEVEL: Acetaminophen (Tylenol), Serum: 18.2

## 2011-04-18 LAB — LIPASE, BLOOD: Lipase: 127 — ABNORMAL HIGH

## 2011-10-23 ENCOUNTER — Encounter (HOSPITAL_BASED_OUTPATIENT_CLINIC_OR_DEPARTMENT_OTHER): Payer: Worker's Compensation | Attending: Internal Medicine

## 2011-10-23 DIAGNOSIS — F411 Generalized anxiety disorder: Secondary | ICD-10-CM | POA: Insufficient documentation

## 2011-10-23 DIAGNOSIS — L97809 Non-pressure chronic ulcer of other part of unspecified lower leg with unspecified severity: Secondary | ICD-10-CM | POA: Insufficient documentation

## 2011-10-23 DIAGNOSIS — E669 Obesity, unspecified: Secondary | ICD-10-CM | POA: Insufficient documentation

## 2011-10-23 DIAGNOSIS — Z79899 Other long term (current) drug therapy: Secondary | ICD-10-CM | POA: Insufficient documentation

## 2011-10-23 DIAGNOSIS — L98499 Non-pressure chronic ulcer of skin of other sites with unspecified severity: Secondary | ICD-10-CM | POA: Insufficient documentation

## 2011-10-31 NOTE — Progress Notes (Signed)
[facility]  NAME:  [name] [name], [name] [name] NO.:  [id number]  MEDICAL RECORD NO.:  [id number]      DATE OF BIRTH:  1946/07/01  PHYSICIAN:  Ardath Sax, M.D.           VISIT DATE:                                  OFFICE VISIT   This is a 66 year old lady who was sent here by her doctors because of ulcers that she has developed mostly on her legs, but really on her arms or scalp and around her neck.  She got some in her ears.  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  It has been treated with azithromycin and clindamycin.  She has also been putting on some sort of cortisone cream on it.  She has had a fracture of her cervical spine and a workman's Comp injury and apparently, she has had a great deal of anxiety, and she has been on Ativan 1 mg 4 times a day, Neurontin 300 mg a day, Aldactone 25 mg b.i.d., Demadex 100 mg a day, and she is also on OxyContin 40 mg about 5 or 6 times a day.  She on exam is 30, is teary, very upset.  She is obese and has these ulcers that appeared to be irritated.  There is erythema around them, some of them on her legs appear to go into the subcutaneous tissue, and are about a centimeter in diameter.  She is afebrile.  Her vital signs are all normal.  She will be treated here in this clinic until we can find some solution here.  We put Silvadene ointment on these leg wounds and then wrapped her in Coban and told her to leave it on until she comes back.  I think a lot of this is from her picking and itching and spreading it around.  We did do a culture, as I suspect it may even be MRSA, so we will check a culture next week.  We will follow along and see if somehow we can get these stop spreading.  I think with her being told not to touch these and scratch at them that maybe will make some headway.     Ardath Sax, M.D.     PP/MEDQ  D:  10/31/2011  T:  10/31/2011  Job:  [id number]

## 2011-11-07 ENCOUNTER — Encounter (HOSPITAL_BASED_OUTPATIENT_CLINIC_OR_DEPARTMENT_OTHER): Payer: PRIVATE HEALTH INSURANCE | Attending: Internal Medicine

## 2011-11-07 DIAGNOSIS — L97809 Non-pressure chronic ulcer of other part of unspecified lower leg with unspecified severity: Secondary | ICD-10-CM | POA: Insufficient documentation

## 2011-11-07 DIAGNOSIS — L280 Lichen simplex chronicus: Secondary | ICD-10-CM | POA: Insufficient documentation

## 2011-11-07 DIAGNOSIS — Z79899 Other long term (current) drug therapy: Secondary | ICD-10-CM | POA: Insufficient documentation

## 2011-11-07 DIAGNOSIS — L98499 Non-pressure chronic ulcer of skin of other sites with unspecified severity: Secondary | ICD-10-CM | POA: Insufficient documentation

## 2011-11-20 ENCOUNTER — Encounter (HOSPITAL_BASED_OUTPATIENT_CLINIC_OR_DEPARTMENT_OTHER): Payer: Worker's Compensation

## 2011-12-31 ENCOUNTER — Encounter (HOSPITAL_BASED_OUTPATIENT_CLINIC_OR_DEPARTMENT_OTHER): Payer: Worker's Compensation | Attending: General Surgery

## 2011-12-31 DIAGNOSIS — L98499 Non-pressure chronic ulcer of skin of other sites with unspecified severity: Secondary | ICD-10-CM | POA: Insufficient documentation

## 2011-12-31 DIAGNOSIS — Z79899 Other long term (current) drug therapy: Secondary | ICD-10-CM | POA: Insufficient documentation

## 2011-12-31 DIAGNOSIS — L97809 Non-pressure chronic ulcer of other part of unspecified lower leg with unspecified severity: Secondary | ICD-10-CM | POA: Insufficient documentation

## 2011-12-31 DIAGNOSIS — L280 Lichen simplex chronicus: Secondary | ICD-10-CM | POA: Insufficient documentation

## 2012-01-23 ENCOUNTER — Encounter (HOSPITAL_BASED_OUTPATIENT_CLINIC_OR_DEPARTMENT_OTHER): Payer: Worker's Compensation | Attending: General Surgery

## 2012-01-23 DIAGNOSIS — L280 Lichen simplex chronicus: Secondary | ICD-10-CM | POA: Insufficient documentation

## 2012-01-28 ENCOUNTER — Encounter (HOSPITAL_BASED_OUTPATIENT_CLINIC_OR_DEPARTMENT_OTHER): Payer: PRIVATE HEALTH INSURANCE

## 2012-04-23 ENCOUNTER — Encounter (HOSPITAL_BASED_OUTPATIENT_CLINIC_OR_DEPARTMENT_OTHER): Payer: PRIVATE HEALTH INSURANCE | Attending: General Surgery

## 2012-05-21 ENCOUNTER — Encounter (HOSPITAL_BASED_OUTPATIENT_CLINIC_OR_DEPARTMENT_OTHER): Payer: PRIVATE HEALTH INSURANCE

## 2012-11-22 ENCOUNTER — Ambulatory Visit: Payer: Self-pay | Admitting: Podiatry

## 2012-11-24 ENCOUNTER — Ambulatory Visit: Payer: Self-pay | Admitting: Podiatry

## 2012-12-03 ENCOUNTER — Ambulatory Visit: Payer: Self-pay | Admitting: Podiatry

## 2012-12-20 ENCOUNTER — Ambulatory Visit: Payer: Self-pay | Admitting: Podiatry

## 2013-03-29 ENCOUNTER — Other Ambulatory Visit (HOSPITAL_COMMUNITY): Payer: Self-pay | Admitting: Family Medicine

## 2013-03-29 DIAGNOSIS — R109 Unspecified abdominal pain: Secondary | ICD-10-CM

## 2013-03-29 DIAGNOSIS — R112 Nausea with vomiting, unspecified: Secondary | ICD-10-CM

## 2013-03-30 ENCOUNTER — Ambulatory Visit (HOSPITAL_COMMUNITY)
Admission: RE | Admit: 2013-03-30 | Discharge: 2013-03-30 | Disposition: A | Discharge status: Home / Self Care | Payer: Medicare Other | Source: Ambulatory Visit | Attending: Family Medicine | Admitting: Family Medicine

## 2013-03-30 DIAGNOSIS — Q619 Cystic kidney disease, unspecified: Secondary | ICD-10-CM | POA: Insufficient documentation

## 2013-03-30 DIAGNOSIS — I10 Essential (primary) hypertension: Secondary | ICD-10-CM | POA: Insufficient documentation

## 2013-03-30 DIAGNOSIS — R109 Unspecified abdominal pain: Secondary | ICD-10-CM | POA: Insufficient documentation

## 2013-03-30 DIAGNOSIS — R112 Nausea with vomiting, unspecified: Secondary | ICD-10-CM | POA: Insufficient documentation

## 2013-06-10 ENCOUNTER — Ambulatory Visit (INDEPENDENT_AMBULATORY_CARE_PROVIDER_SITE_OTHER): Payer: Worker's Compensation | Admitting: Podiatry

## 2013-06-10 ENCOUNTER — Encounter: Payer: Self-pay | Admitting: Podiatry

## 2013-06-10 VITALS — BP 120/71 | HR 77 | Ht 61.0 in | Wt 173.0 lb

## 2013-06-10 DIAGNOSIS — G589 Mononeuropathy, unspecified: Secondary | ICD-10-CM

## 2013-06-10 DIAGNOSIS — B351 Tinea unguium: Secondary | ICD-10-CM

## 2013-06-10 DIAGNOSIS — M21619 Bunion of unspecified foot: Secondary | ICD-10-CM

## 2013-06-10 DIAGNOSIS — Q828 Other specified congenital malformations of skin: Secondary | ICD-10-CM | POA: Insufficient documentation

## 2013-06-10 DIAGNOSIS — G629 Polyneuropathy, unspecified: Secondary | ICD-10-CM | POA: Insufficient documentation

## 2013-06-10 DIAGNOSIS — M2010 Hallux valgus (acquired), unspecified foot: Secondary | ICD-10-CM

## 2013-06-10 NOTE — Progress Notes (Signed)
Subjective:  Came in with her case manager to have her feet checked and to have diabetic shoes. 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  Takes pain pills for lower back and legs pain.  Objective: Purple cold feet bilateral. Pedal pulses are palpable.  Hallux valgus with bunion bilateral. Hammered both great toe. No sharp or pricking sensory perception.   Assessment: Peripheral neuropathy. Deformed metatarsal.  Digital corn on 5th digit right foot. Deformed nails bilateral. R/O Reynaud's   Plan: Both feet are measured for diabetic shoes. Digital corn debrided.  All nails debrided. Advised to keep both feet warm with warm furry socks.

## 2013-06-10 NOTE — Patient Instructions (Signed)
Seen for routine foot care and for diabetic shoes. All nails and calluses trimmed. Measured for diabetic shoes.  No new problems noted. Return in 2 months.

## 2013-08-07 HISTORY — PX: BACK SURGERY: SHX140

## 2013-08-12 ENCOUNTER — Ambulatory Visit: Payer: Self-pay | Admitting: Podiatry

## 2013-10-25 ENCOUNTER — Encounter: Payer: Self-pay | Admitting: Podiatry

## 2013-10-25 ENCOUNTER — Ambulatory Visit (INDEPENDENT_AMBULATORY_CARE_PROVIDER_SITE_OTHER): Payer: Worker's Compensation | Admitting: Podiatry

## 2013-10-25 VITALS — BP 141/73 | HR 90

## 2013-10-25 DIAGNOSIS — L8990 Pressure ulcer of unspecified site, unspecified stage: Secondary | ICD-10-CM

## 2013-10-25 DIAGNOSIS — G589 Mononeuropathy, unspecified: Secondary | ICD-10-CM

## 2013-10-25 DIAGNOSIS — L89609 Pressure ulcer of unspecified heel, unspecified stage: Secondary | ICD-10-CM

## 2013-10-25 DIAGNOSIS — G629 Polyneuropathy, unspecified: Secondary | ICD-10-CM

## 2013-10-25 MED ORDER — CARRINGTON MOISTURE BARRIER EX CREA: TOPICAL_CREAM | CUTANEOUS | Status: DC | PRN

## 2013-10-25 NOTE — Progress Notes (Signed)
Subjective: 68 year old female presents accompanied by a care taker via wheel chair wearing neck brace.  Stated that she was paralyzed and was not able to walk. 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  Concerned about a black spot on both heels.  Examined while patient is sitting in wheel chair.  Objective: Dependency Rubor both feet. Firm and black colored necrotic skin left heel about 5-6 cm in diameter with more than 1/4" thickness.  Right heel lesion is more superficial about 2-3 cm in diameter.  Pedal pulses are palpable on both Dorsalis pedis. No abnormal edema noted.   Assessment: Dry Gangrenous dermal and subdermal layer posterior plantar lateral of heel  from extended pressure to the heel while laying in bed.  Necrotic lesion Right 2-3 cm,  Left 5-6 cm in diameter.  Plan: Reviewed the findings with patient, care taker, and her case worker. This may benefit from getting a consultation from wound care center.  Advised to keep the area moisturized and protect from any pressure. Patient will use wedged sponge while in bed. Will monitor again in one month.

## 2013-10-25 NOTE — Patient Instructions (Signed)
Seen for heel lesion. May benefit from a consultation by Wound Care Center.

## 2013-11-07 ENCOUNTER — Encounter (HOSPITAL_BASED_OUTPATIENT_CLINIC_OR_DEPARTMENT_OTHER): Payer: Worker's Compensation | Attending: Plastic Surgery

## 2013-11-07 DIAGNOSIS — L89609 Pressure ulcer of unspecified heel, unspecified stage: Secondary | ICD-10-CM | POA: Insufficient documentation

## 2013-11-07 DIAGNOSIS — L8992 Pressure ulcer of unspecified site, stage 2: Secondary | ICD-10-CM | POA: Insufficient documentation

## 2013-11-08 NOTE — Progress Notes (Signed)
[facility]  NAME:  Ninfa MeekerSTYERS, [name]                ACCOUNT NO.:  [id number]  MEDICAL RECORD NO.:  [id number]      DATE OF BIRTH:  10/01/1945  PHYSICIAN:  Wayland Denislaire Sanger, DO       VISIT DATE:  11/07/2013                                  OFFICE VISIT   HISTORY OF PRESENT ILLNESS:  The patient is a 68 year old female who is here for evaluation of bilateral heel ulcers.  She has paralysis secondary to a fall from work and has been essentially bed-bound.  The pressure from the bed has caused some ulcers on her heels.  PAST MEDICAL HISTORY:  Positive for hypertension, hyperlipidemia, and gastroesophageal reflux disease.  SURGERIES:  Hysterectomy, appendectomy, and cervical fusion.  ALLERGIES:  DEXAMETHASONE.  MEDICATIONS:  Aspirin, trazodone, baclofen, chlorine, fenofibrate, doxycycline, Zetia, Neurontin, Ativan, Mag Ox, Zaroxolyn, Prilosec, OxyContin, K-Dur, Phenergan, Senokot, Aldactone, Flomax, Ambien, Demadex, and Vortioxetine.  SOCIAL HISTORY:  The patient has help with the nurses aide.  Denies any tobacco.  REVIEW OF SYSTEMS:  Otherwise negative.  PHYSICAL EXAMINATION:  GENERAL:  On exam, she is alert.  She seems to be aware of her condition.  She is pleasant. HEENT:  Pupils are equal.  Extraocular muscles are intact. NECK:  She does not have any cervical lymphadenopathy. ABDOMEN:  Nontender. EXTREMITIES:  Her upper and lower extremity pulses are positive.  Her right heel appears that she had an ulcer, but has healed.  There is no break in the skin.  Her left heel has a black eschar that was debrided that I debrided today and those notes are noted in the chart. It does not appear to be infected.  There is also a little irritation with some superficial break down.  RECOMMENDATIONS:  Recommendation is for Silvadene.  We will also check a pre-albumin offload, multivitamin, vitamin C, zinc, and follow up in 1 week.     Wayland Denislaire Sanger,  DO     CS/MEDQ  D:  11/07/2013  T:  11/08/2013  Job:  [id number]

## 2013-11-25 ENCOUNTER — Ambulatory Visit: Payer: Self-pay | Admitting: Podiatry

## 2013-12-06 ENCOUNTER — Encounter (HOSPITAL_BASED_OUTPATIENT_CLINIC_OR_DEPARTMENT_OTHER): Payer: Medicare Other | Attending: General Surgery

## 2013-12-06 DIAGNOSIS — L8992 Pressure ulcer of unspecified site, stage 2: Secondary | ICD-10-CM | POA: Insufficient documentation

## 2013-12-06 DIAGNOSIS — L89609 Pressure ulcer of unspecified heel, unspecified stage: Secondary | ICD-10-CM | POA: Insufficient documentation

## 2013-12-09 ENCOUNTER — Encounter: Payer: Self-pay | Admitting: Podiatry

## 2013-12-09 ENCOUNTER — Ambulatory Visit (INDEPENDENT_AMBULATORY_CARE_PROVIDER_SITE_OTHER): Payer: Worker's Compensation | Admitting: Podiatry

## 2013-12-09 VITALS — BP 136/65 | HR 79

## 2013-12-09 DIAGNOSIS — L8990 Pressure ulcer of unspecified site, unspecified stage: Secondary | ICD-10-CM

## 2013-12-09 DIAGNOSIS — M79606 Pain in leg, unspecified: Secondary | ICD-10-CM

## 2013-12-09 DIAGNOSIS — L89609 Pressure ulcer of unspecified heel, unspecified stage: Secondary | ICD-10-CM

## 2013-12-09 DIAGNOSIS — G629 Polyneuropathy, unspecified: Secondary | ICD-10-CM

## 2013-12-09 DIAGNOSIS — B351 Tinea unguium: Secondary | ICD-10-CM

## 2013-12-09 DIAGNOSIS — G589 Mononeuropathy, unspecified: Secondary | ICD-10-CM

## 2013-12-09 DIAGNOSIS — M79609 Pain in unspecified limb: Secondary | ICD-10-CM

## 2013-12-09 NOTE — Patient Instructions (Signed)
Seen for follow up on left foot lesion and toe nails. Under Wound care center management for heel ulcer left foot. All nails debrided.  Return in 3 months or as needed.

## 2013-12-09 NOTE — Progress Notes (Signed)
Patient is in wheel chair accompanied by her care taker. Requests both feet checked and nails trimmed.  Patient is in wheel chair bound.  Objective: Left heel is bandaged up from Wound care center.  Pain on left heel with pressure. All nails are hypertrophic. 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 Pedal pulses are palpable and feet are cold and in purple color.  Assessment: Left heel ulcer under Wound care center management. Hypertrophic nails x 10.   Plan: All nails debrided.

## 2013-12-20 ENCOUNTER — Inpatient Hospital Stay (HOSPITAL_COMMUNITY)
Admission: EM | Admit: 2013-12-20 | Discharge: 2013-12-22 | DRG: 689 | Disposition: A | Discharge status: Home Health | Payer: Medicare Other | Attending: Internal Medicine | Admitting: Internal Medicine

## 2013-12-20 ENCOUNTER — Emergency Department (HOSPITAL_COMMUNITY): Payer: Medicare Other

## 2013-12-20 ENCOUNTER — Encounter (HOSPITAL_COMMUNITY): Payer: Self-pay | Admitting: Emergency Medicine

## 2013-12-20 DIAGNOSIS — Z7982 Long term (current) use of aspirin: Secondary | ICD-10-CM

## 2013-12-20 DIAGNOSIS — Z79899 Other long term (current) drug therapy: Secondary | ICD-10-CM

## 2013-12-20 DIAGNOSIS — E785 Hyperlipidemia, unspecified: Secondary | ICD-10-CM | POA: Diagnosis present

## 2013-12-20 DIAGNOSIS — E876 Hypokalemia: Secondary | ICD-10-CM | POA: Diagnosis present

## 2013-12-20 DIAGNOSIS — L97409 Non-pressure chronic ulcer of unspecified heel and midfoot with unspecified severity: Secondary | ICD-10-CM | POA: Diagnosis present

## 2013-12-20 DIAGNOSIS — K219 Gastro-esophageal reflux disease without esophagitis: Secondary | ICD-10-CM | POA: Diagnosis present

## 2013-12-20 DIAGNOSIS — Z87891 Personal history of nicotine dependence: Secondary | ICD-10-CM

## 2013-12-20 DIAGNOSIS — L89609 Pressure ulcer of unspecified heel, unspecified stage: Secondary | ICD-10-CM | POA: Diagnosis present

## 2013-12-20 DIAGNOSIS — I10 Essential (primary) hypertension: Secondary | ICD-10-CM | POA: Diagnosis present

## 2013-12-20 DIAGNOSIS — R4182 Altered mental status, unspecified: Secondary | ICD-10-CM

## 2013-12-20 DIAGNOSIS — G8250 Quadriplegia, unspecified: Secondary | ICD-10-CM | POA: Diagnosis present

## 2013-12-20 DIAGNOSIS — N390 Urinary tract infection, site not specified: Principal | ICD-10-CM | POA: Diagnosis present

## 2013-12-20 DIAGNOSIS — L8991 Pressure ulcer of unspecified site, stage 1: Secondary | ICD-10-CM | POA: Diagnosis present

## 2013-12-20 DIAGNOSIS — G9341 Metabolic encephalopathy: Secondary | ICD-10-CM | POA: Diagnosis present

## 2013-12-20 DIAGNOSIS — G8929 Other chronic pain: Secondary | ICD-10-CM | POA: Diagnosis present

## 2013-12-20 DIAGNOSIS — Z888 Allergy status to other drugs, medicaments and biological substances status: Secondary | ICD-10-CM

## 2013-12-20 HISTORY — DX: Essential (primary) hypertension: I10

## 2013-12-20 HISTORY — DX: Gastro-esophageal reflux disease without esophagitis: K21.9

## 2013-12-20 HISTORY — DX: Chronic pain syndrome: G89.4

## 2013-12-20 HISTORY — DX: Quadriplegia, unspecified: G82.50

## 2013-12-20 HISTORY — DX: Hyperlipidemia, unspecified: E78.5

## 2013-12-20 HISTORY — DX: Pressure ulcer of other site, unspecified stage: L89.899

## 2013-12-20 LAB — COMPREHENSIVE METABOLIC PANEL
ALT: 21 U/L (ref 0–35)
AST: 19 U/L (ref 0–37)
Albumin: 4.7 g/dL (ref 3.5–5.2)
Alkaline Phosphatase: 131 U/L — ABNORMAL HIGH (ref 39–117)
BUN: 32 mg/dL — AB (ref 6–23)
CALCIUM: 10.8 mg/dL — AB (ref 8.4–10.5)
CO2: 31 mEq/L (ref 19–32)
Chloride: 92 mEq/L — ABNORMAL LOW (ref 96–112)
Creatinine, Ser: 0.81 mg/dL (ref 0.50–1.10)
GFR calc non Af Amer: 72 mL/min — ABNORMAL LOW (ref >90)
GFR, EST AFRICAN AMERICAN: 84 mL/min — AB (ref >90)
GLUCOSE: 157 mg/dL — AB (ref 70–99)
Potassium: 3 mEq/L — ABNORMAL LOW (ref 3.7–5.3)
Sodium: 142 mEq/L (ref 137–147)
Total Bilirubin: 0.4 mg/dL (ref 0.3–1.2)
Total Protein: 8.5 g/dL — ABNORMAL HIGH (ref 6.0–8.3)

## 2013-12-20 LAB — CBC WITH DIFFERENTIAL/PLATELET
Basophils Absolute: 0.1 10*3/uL (ref 0.0–0.1)
Basophils Relative: 1 % (ref 0–1)
EOS ABS: 0.2 10*3/uL (ref 0.0–0.7)
EOS PCT: 1 % (ref 0–5)
HCT: 47.4 % — ABNORMAL HIGH (ref 36.0–46.0)
Hemoglobin: 16.3 g/dL — ABNORMAL HIGH (ref 12.0–15.0)
LYMPHS ABS: 1.8 10*3/uL (ref 0.7–4.0)
Lymphocytes Relative: 15 % (ref 12–46)
MCH: 28.2 pg (ref 26.0–34.0)
MCHC: 34.4 g/dL (ref 30.0–36.0)
MCV: 82.1 fL (ref 78.0–100.0)
Monocytes Absolute: 0.6 10*3/uL (ref 0.1–1.0)
Monocytes Relative: 5 % (ref 3–12)
Neutro Abs: 9.9 10*3/uL — ABNORMAL HIGH (ref 1.7–7.7)
Neutrophils Relative %: 78 % — ABNORMAL HIGH (ref 43–77)
PLATELETS: 314 10*3/uL (ref 150–400)
RBC: 5.77 MIL/uL — ABNORMAL HIGH (ref 3.87–5.11)
RDW: 12.7 % (ref 11.5–15.5)
WBC: 12.6 10*3/uL — ABNORMAL HIGH (ref 4.0–10.5)

## 2013-12-20 LAB — I-STAT CG4 LACTIC ACID, ED: Lactic Acid, Venous: 1.4 mmol/L (ref 0.5–2.2)

## 2013-12-20 LAB — URINALYSIS, ROUTINE W REFLEX MICROSCOPIC
Bilirubin Urine: NEGATIVE
Glucose, UA: NEGATIVE mg/dL
Ketones, ur: NEGATIVE mg/dL
Nitrite: POSITIVE — AB
PH: 7 (ref 5.0–8.0)
Protein, ur: NEGATIVE mg/dL
Specific Gravity, Urine: 1.012 (ref 1.005–1.030)
Urobilinogen, UA: 0.2 mg/dL (ref 0.0–1.0)

## 2013-12-20 LAB — URINE MICROSCOPIC-ADD ON

## 2013-12-20 LAB — PHOSPHORUS: PHOSPHORUS: 4.7 mg/dL — AB (ref 2.3–4.6)

## 2013-12-20 LAB — AMMONIA: Ammonia: 27 umol/L (ref 11–60)

## 2013-12-20 LAB — MAGNESIUM: Magnesium: 2.5 mg/dL (ref 1.5–2.5)

## 2013-12-20 MED ORDER — TORSEMIDE 20 MG PO TABS
20.0000 mg | ORAL_TABLET | Freq: Every day | ORAL | Status: DC
  Administered 2013-12-21 – 2013-12-22 (×2): 20 mg via ORAL
  Filled 2013-12-20 (×2): qty 1

## 2013-12-20 MED ORDER — KCL IN DEXTROSE-NACL 20-5-0.45 MEQ/L-%-% IV SOLN
INTRAVENOUS | Status: DC
  Administered 2013-12-20 – 2013-12-22 (×5): via INTRAVENOUS
  Filled 2013-12-20 (×5): qty 1000

## 2013-12-20 MED ORDER — DEXTROSE 5 % IV SOLN
1.0000 g | INTRAVENOUS | Status: DC
  Administered 2013-12-21 – 2013-12-22 (×2): 1 g via INTRAVENOUS
  Filled 2013-12-20 (×2): qty 10

## 2013-12-20 MED ORDER — ADULT MULTIVITAMIN W/MINERALS CH
1.0000 | ORAL_TABLET | Freq: Every day | ORAL | Status: DC
  Administered 2013-12-21 – 2013-12-22 (×2): 1 via ORAL
  Filled 2013-12-20 (×3): qty 1

## 2013-12-20 MED ORDER — POTASSIUM CHLORIDE CRYS ER 20 MEQ PO TBCR
20.0000 meq | EXTENDED_RELEASE_TABLET | Freq: Two times a day (BID) | ORAL | Status: DC
  Administered 2013-12-21 – 2013-12-22 (×3): 20 meq via ORAL
  Filled 2013-12-20 (×5): qty 1

## 2013-12-20 MED ORDER — EZETIMIBE 10 MG PO TABS
10.0000 mg | ORAL_TABLET | Freq: Every day | ORAL | Status: DC
  Administered 2013-12-21 – 2013-12-22 (×2): 10 mg via ORAL
  Filled 2013-12-20 (×3): qty 1

## 2013-12-20 MED ORDER — ACETAMINOPHEN 650 MG RE SUPP: 650.0000 mg | Freq: Four times a day (QID) | RECTAL | Status: DC | PRN

## 2013-12-20 MED ORDER — HEPARIN SODIUM (PORCINE) 5000 UNIT/ML IJ SOLN
5000.0000 [IU] | Freq: Three times a day (TID) | INTRAMUSCULAR | Status: DC
  Administered 2013-12-20 – 2013-12-22 (×6): 5000 [IU] via SUBCUTANEOUS
  Filled 2013-12-20 (×9): qty 1

## 2013-12-20 MED ORDER — MAGNESIUM OXIDE 400 (241.3 MG) MG PO TABS
400.0000 mg | ORAL_TABLET | Freq: Every day | ORAL | Status: DC
  Administered 2013-12-21 – 2013-12-22 (×2): 400 mg via ORAL
  Filled 2013-12-20 (×3): qty 1

## 2013-12-20 MED ORDER — SODIUM CHLORIDE 0.9 % IV BOLUS (SEPSIS)
1000.0000 mL | Freq: Once | INTRAVENOUS | Status: AC
  Administered 2013-12-20: 1000 mL via INTRAVENOUS

## 2013-12-20 MED ORDER — ONDANSETRON HCL 4 MG PO TABS: 4.0000 mg | ORAL_TABLET | Freq: Four times a day (QID) | ORAL | Status: DC | PRN

## 2013-12-20 MED ORDER — VORTIOXETINE HBR 10 MG PO TABS
10.0000 mg | ORAL_TABLET | Freq: Every day | ORAL | Status: DC
  Administered 2013-12-21 – 2013-12-22 (×2): 10 mg via ORAL
  Filled 2013-12-20 (×3): qty 1

## 2013-12-20 MED ORDER — TAMSULOSIN HCL 0.4 MG PO CAPS
0.4000 mg | ORAL_CAPSULE | Freq: Every day | ORAL | Status: DC
  Administered 2013-12-21 – 2013-12-22 (×2): 0.4 mg via ORAL
  Filled 2013-12-20 (×3): qty 1

## 2013-12-20 MED ORDER — TRIFLUOPERAZINE HCL 5 MG PO TABS
5.0000 mg | ORAL_TABLET | Freq: Every day | ORAL | Status: DC
  Administered 2013-12-21 – 2013-12-22 (×2): 5 mg via ORAL
  Filled 2013-12-20 (×3): qty 1

## 2013-12-20 MED ORDER — DEXTROSE 5 % IV SOLN
1.0000 g | Freq: Once | INTRAVENOUS | Status: AC
  Administered 2013-12-20: 1 g via INTRAVENOUS
  Filled 2013-12-20: qty 10

## 2013-12-20 MED ORDER — ASPIRIN EC 81 MG PO TBEC
81.0000 mg | DELAYED_RELEASE_TABLET | Freq: Every day | ORAL | Status: DC
  Administered 2013-12-21 – 2013-12-22 (×2): 81 mg via ORAL
  Filled 2013-12-20 (×3): qty 1

## 2013-12-20 MED ORDER — ACETAMINOPHEN 325 MG PO TABS
650.0000 mg | ORAL_TABLET | Freq: Four times a day (QID) | ORAL | Status: DC | PRN
  Administered 2013-12-21: 650 mg via ORAL
  Filled 2013-12-20: qty 2

## 2013-12-20 MED ORDER — PANTOPRAZOLE SODIUM 40 MG PO TBEC
40.0000 mg | DELAYED_RELEASE_TABLET | Freq: Every day | ORAL | Status: DC
  Administered 2013-12-21 – 2013-12-22 (×2): 40 mg via ORAL
  Filled 2013-12-20 (×2): qty 1

## 2013-12-20 MED ORDER — ENSURE PUDDING PO PUDG
1.0000 | Freq: Three times a day (TID) | ORAL | Status: DC
  Administered 2013-12-20 – 2013-12-21 (×2): 1 via ORAL
  Filled 2013-12-20 (×5): qty 1

## 2013-12-20 MED ORDER — ONDANSETRON HCL 4 MG/2ML IJ SOLN
4.0000 mg | Freq: Four times a day (QID) | INTRAMUSCULAR | Status: DC | PRN
  Administered 2013-12-21: 4 mg via INTRAVENOUS
  Filled 2013-12-20: qty 2

## 2013-12-20 NOTE — ED Notes (Signed)
AWARE OF NAME CHANGE. FAMILY PRESENT TO ASSIST WITH HISTORY. AWARE OF WOUNDS

## 2013-12-20 NOTE — ED Notes (Signed)
Bed: WA17 Expected date:  Expected time:  Means of arrival:  Comments: EMS fever/lethargy

## 2013-12-20 NOTE — ED Provider Notes (Addendum)
CSN: [id number]     Arrival date & time 12/20/13  [id number] History   First MD Initiated Contact with Patient 12/20/13 [phone]     No chief complaint on file.    (Consider location/radiation/quality/duration/timing/severity/associated sxs/prior Treatment) Patient is a 68 y.o. female presenting with altered mental status. The history is provided by the patient. The history is limited by the condition of the patient.  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  home health nurse told EMS fever and decreased responsiveness for the last 2 days Associated symptoms comment:  Pt unable to say much but did indicate she had abd pain   No past medical history on file. No past surgical history on file. No family history on file. History  Substance Use Topics  . Smoking status: Not on file  . Smokeless tobacco: Not on file  . Alcohol Use: Not on file   OB History   No data available     Review of Systems  Unable to perform ROS     Allergies  Review of patient's allergies indicates not on file.  Home Medications   Prior to Admission medications   Not on File   BP 158/109  Pulse 93  Temp(Src) 98.7 F (37.1 C) (Oral)  Resp 21  SpO2 97% Physical Exam  Nursing note and vitals reviewed. Constitutional: She appears well-developed and well-nourished. No distress.  Pt is awake and alert but cannot answer questions.    HENT:  Head: Normocephalic and atraumatic.  Mouth/Throat: Oropharynx is clear and moist. Mucous membranes are dry.  Eyes: Conjunctivae and EOM are normal. Pupils are equal, round, and reactive to light.  Neck: Normal range of motion. Neck supple.  Cardiovascular: Normal rate, regular rhythm and intact distal pulses.   No murmur heard. Pulmonary/Chest: Effort normal and breath  sounds normal. No respiratory distress. She has no wheezes. She has no rales.  Coarse breath sounds with poor effort  Abdominal: Soft. She exhibits no distension. There is no tenderness. There is no rebound and no guarding.  Musculoskeletal: Normal range of motion. She exhibits no edema.  Stage 1 pressure ulcer on the heal the size of a dime without surrounding edema or cellulitis.  Neurological: She is alert.  Tremors of the hands bilaterally and able to move the left arm spontaneously.  Contractures of the feet bilaterally  Skin: Skin is warm and dry. No rash noted. No erythema.  Warm to the touch  Psychiatric: She has a normal mood and affect. Her behavior is normal.    ED Course  Procedures (including critical care time) Labs Review Labs Reviewed  CBC WITH DIFFERENTIAL - Abnormal; Notable for the following:    WBC 12.6 (*)    RBC 5.77 (*)    Hemoglobin 16.3 (*)    HCT 47.4 (*)    Neutrophils Relative % 78 (*)    Neutro Abs 9.9 (*)    All other components within normal limits  COMPREHENSIVE METABOLIC PANEL - Abnormal; Notable for the following:    Potassium 3.0 (*)    Chloride 92 (*)    Glucose, Bld 157 (*)    BUN 32 (*)    Calcium 10.8 (*)    Total Protein 8.5 (*)    Alkaline Phosphatase 131 (*)    GFR calc non Af Amer 72 (*)  GFR calc Af Amer 84 (*)    All other components within normal limits  URINALYSIS, ROUTINE W REFLEX MICROSCOPIC - Abnormal; Notable for the following:    Hgb urine dipstick SMALL (*)    Nitrite POSITIVE (*)    Leukocytes, UA LARGE (*)    All other components within normal limits  URINE MICROSCOPIC-ADD ON - Abnormal; Notable for the following:    Bacteria, UA MANY (*)    Casts HYALINE CASTS (*)    All other components within normal limits  URINE CULTURE  AMMONIA  I-STAT CG4 LACTIC ACID, ED    Imaging Review Ct Head Wo Contrast  12/20/2013   CLINICAL DATA:  Altered mental status. Two days of fevers and lethargy.  EXAM: CT HEAD WITHOUT  CONTRAST  TECHNIQUE: Contiguous axial images were obtained from the base of the skull through the vertex without intravenous contrast.  COMPARISON:  None.  FINDINGS: There is mild generalized cerebral atrophy, likely within normal limits for age. There is no evidence of acute cortical infarct, intracranial hemorrhage, mass, midline shift, or extra-axial fluid collection. Orbits are unremarkable. Mastoid air cells are clear. Small amount of right sphenoid sinus fluid is noted.  IMPRESSION: 1. No evidence of acute intracranial abnormality. 2. Small amount of right sphenoid sinus fluid. Correlate clinically for signs of acute sinusitis.   Electronically Signed   By: Sebastian AcheAllen  Grady   On: 12/20/2013 10:22   Dg Chest Port 1 View  12/20/2013   CLINICAL DATA:  Altered mental status  EXAM: PORTABLE CHEST - 1 VIEW  COMPARISON:  None.  FINDINGS: There is no edema or consolidation. The heart size and pulmonary vascularity are normal. No adenopathy. No bone lesions. There is postoperative change in the cervical spine.  IMPRESSION: No edema or consolidation.   Electronically Signed   By: Bretta BangWilliam  Woodruff M.D.   On: 12/20/2013 09:56     EKG Interpretation   Date/Time:  Tuesday December 20 2013 09:53:14 EDT Ventricular Rate:  91 PR Interval:  169 QRS Duration: 71 QT Interval:  417 QTC Calculation: 513 R Axis:   72 Text Interpretation:  Sinus rhythm Biatrial enlargement Nonspecific repol  abnormality, inferior leads Prolonged QT interval No previous tracing  Confirmed by Anitra LauthPLUNKETT  MD, WHITNEY ([id number]) on 12/20/2013 10:29:41 AM      MDM   Final diagnoses:  UTI (lower urinary tract infection)  Altered mental status    Patient with a history of incomplete quadriplegia who lives at home with a home health aide presents today by ambulance with altered mental status. Per the health aide she has had fever for 2 days and not acting herself. Patient is unable to give any history is currently altered. No prior history of  a liver disease however concern for encephalopathy versus infection. Patient does take narcotic medications but her pupils are equal and reactive does not have signs of opiate overdose.  She has a small stage I pressure ulcer on her left heel without signs of surrounding cellulitis or deeper infection causing her symptoms today. She has coarse breath sounds.  CBC, CMP, UA, lactate, ammonia, rectal temperature chest x-ray, head CT pending  11:14 AM Labs consistent with a urinary tract infection which is most likely the cause of the patient's altered mental status and fever. She was covered with Rocephin  Gwyneth SproutWhitney Plunkett, MD 12/20/13 1115  Gwyneth SproutWhitney Plunkett, MD 12/20/13 1143

## 2013-12-20 NOTE — ED Notes (Signed)
This quadriplegic pt. Comes to us via EMS with c/o two day hx of fevers and lethargy according to her home health aide.  The home health aide mentioned pt. Has been being treated for a "wound on her foot; and it is getting better".  She is in no distress.

## 2013-12-20 NOTE — ED Notes (Signed)
Pt repeats name only, will not say anything else.

## 2013-12-20 NOTE — Progress Notes (Signed)
ANTIBIOTIC CONSULT NOTE - INITIAL  Pharmacy Consult for Ceftriaxone Indication: UTI  Allergies  Allergen Reactions  . Decadron [Dexamethasone]     unknown  . Sodium Phosphate     unknown    Patient Measurements: Height: 5\' 3"  (160 cm) IBW/kg (Calculated) : 52.4  Vital Signs: Temp: 99.3 F (37.4 C) (06/16 1302) Temp src: Oral (06/16 1302) BP: 144/75 mmHg (06/16 1302) Pulse Rate: 90 (06/16 1302) Intake/Output from previous day:   Intake/Output from this shift: Total I/O In: 600 [P.O.:500; I.V.:100] Out: -   Labs:  Recent Labs  12/20/13 1029  WBC 12.6*  HGB 16.3*  PLT 314  CREATININE 0.81   The CrCl is unknown because both a height and weight (above a minimum accepted value) are required for this calculation. 75 ml/min/1.1073m2 (normalized)  No results found for this basename: VANCOTROUGH, VANCOPEAK, VANCORANDOM, GENTTROUGH, GENTPEAK, GENTRANDOM, TOBRATROUGH, TOBRAPEAK, TOBRARND, AMIKACINPEAK, AMIKACINTROU, AMIKACIN,  in the last 72 hours   Microbiology: No results found for this or any previous visit (from the past 720 hour(s)).  Medical History: Past Medical History  Diagnosis Date  . GERD (gastroesophageal reflux disease)   . Quadriplegia   . Hypertension   . Pressure ulcer of foot   . Chronic pain disorder   . Hyperlipidemia     Medications:  Scheduled:  Infusions:  PRN:    Assessment: 68 yo quadriplegic female presents from home with altered mental status and fever per home health aide. Urinalysis is consistent with UTI and Pharmacy consulted to dose ceftriaxone.  Anti-infectives: 6/16 >> Ceftriaxone >>  Labs / Vitals: Tmax: 99.3 WBC: 12.6k Renal: SCr 0.81, CrCl 75 ml/min  Microbiology: 6/16 urine: pending  Goal of Therapy:  Eradication of infection  Plan:   Ceftriaxone 1g IV q24h  No renal or hepatic dosage adjustments necessary with ceftriaxone  Loralee PacasErin Williamson, PharmD, BCPS Pager: [phone] 12/20/2013,1:39 PM

## 2013-12-20 NOTE — Progress Notes (Addendum)
  CARE MANAGEMENT ED NOTE 12/20/2013  Patient:  [name] [name],[name] [name]   Account Number:  [id number]  Date Initiated:  12/20/2013  Documentation initiated by:  Edd ArbourGIBBS,KIMBERLY  Subjective/Objective Assessment:   [age] y r old patient listed as self pay     Subjective/Objective Assessment Detail:   pcp Coralie KeensSamuel Newsome [facility] [place] [address] [place] [phone] fax [phone] Requesting faxed clinicals on pt at d/c  [facility]  Pt's address is  [address] [address]  [place] [address]  [phone]  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 [employer] is     Action/Plan:   ED Cm noted no pcp, Cm reviewed chart on ED unit indicating pcp, EPIC updated,   Action/Plan Detail:   [id number] Spoke with Kristen at Dr Benancio DeedsNewsome office [phone] afterED CM noted no address listed for pt in EPIC along with no insurance coverage  She was able to assist Cm with pt's address & home number & listed work comp name  UR completed   Anticipated DC Date:  12/23/2013     Status Recommendation to Physician:   Result of Recommendation:    Other ED Services  Consult Working Plan    DC Planning Services  PCP issues  Outpatient Services - Pt will follow up  Other    Choice offered to / List presented to:            Status of service:  Completed, signed off  ED Comments:   ED Comments Detail:  12/20/13 1415 Cm spoke Riley LamDouglas W who with at [facility] [employer] who was informed of pt's hospitalization in the vacation absence (12/19/13 to 12/23/13) of pt's assigned agent Barkley BrunsVeronda Rubright [phone]  Coralie KeensSamuel Newsome, MD On 01/09/2014 Next follow up appointment with pcp Dr Benancio DeedsNewsome is on January 09 2014 at 2 pm  [address] [place] [address] [phone]

## 2013-12-20 NOTE — H&P (Signed)
[facility] History and Physical  [name] MeekerLinda Hizer ZOX:[id number] DOB: 07-Oct-1945 DOA: 12/20/2013  Referring physician: Dr. Deveron FurlongPlunkitt PCP: [name],SAMUEL, MD   Chief Complaint: AMS, fever and decreased responsiveness  HPI: [name] MeekerLinda [name] is a 68 y.o. 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 Reportedly EMS told patient had fever and decreased responsiveness With limited history given that patient moved here recently. Caregiver also is not present and patient currently has altered mental status is such is unable to provide history. Much of the history is obtained from EMR and discussion with the ED physician. As listed above patient was brought to the ED for further evaluation due to fever and decreased responsiveness. Urinalysis revealed positive nitrite light large leukocytes many bacteria and small hemoglobin on urine dipstick  Upon further evaluation patient was found to have elevated white blood cell count of 12.6 but currently not needing sutures criteria based on the most recent vitals. Hence we have been consulted for further admission evaluation and recommendations.   Review of Systems:  Unable to properly assess due to metabolic encephalopathy  Past Medical History  Diagnosis Date  . GERD (gastroesophageal reflux disease)   . Quadriplegia   . Hypertension   . Pressure ulcer of foot   . Chronic pain disorder   . Hyperlipidemia    Past Surgical History  Procedure Laterality Date  . Back surgery  08/2013  . Abdominal hysterectomy     Social History:  reports that she has quit smoking. Her smoking use included Cigarettes. She smoked 0.25 packs per day. She has never used smokeless tobacco. She reports that she does not drink alcohol or use illicit drugs.  Allergies  Allergen Reactions  . Decadron [Dexamethasone]     unknown  . Sodium Phosphate     unknown    Family History  Problem Relation Age of Onset  . Adopted: Yes     Prior to  Admission medications   Medication Sig Start Date End Date Taking? Authorizing Provider  aspirin EC 81 MG tablet Take 81 mg by mouth daily.   Yes Historical Provider, MD  bisacodyl (DULCOLAX) 10 MG suppository Place 10 mg rectally daily as needed for mild constipation or moderate constipation.   Yes Historical Provider, MD  ezetimibe (ZETIA) 10 MG tablet Take 10 mg by mouth daily.   Yes Historical Provider, MD  gabapentin (NEURONTIN) 300 MG capsule Take 300 mg by mouth 3 (three) times daily.   Yes Historical Provider, MD  LORazepam (ATIVAN) 1 MG tablet Take 1 mg by mouth 4 (four) times daily as needed for anxiety.   Yes Historical Provider, MD  magnesium oxide (MAG-OX) 400 MG tablet Take 400 mg by mouth daily.   Yes Historical Provider, MD  Multiple Vitamin (MULTIVITAMIN WITH MINERALS) TABS tablet Take 1 tablet by mouth daily.   Yes Historical Provider, MD  omeprazole (PRILOSEC) 20 MG capsule Take 20 mg by mouth daily.   Yes Historical Provider, MD  potassium chloride SA (K-DUR,KLOR-CON) 20 MEQ tablet Take 20 mEq by mouth 2 (two) times daily.   Yes Historical Provider, MD  promethazine (PHENERGAN) 25 MG tablet Take 25 mg by mouth every 6 (six) hours as needed for nausea or vomiting.   Yes Historical Provider, MD  senna (SENOKOT) 8.6 MG tablet Take 1 tablet by mouth daily.   Yes Historical Provider, MD  tamsulosin (FLOMAX) 0.4 MG CAPS capsule Take 0.4 mg by mouth daily.   Yes Historical Provider, MD  torsemide (DEMADEX) 20 MG  tablet Take 20 mg by mouth daily.   Yes Historical Provider, MD  traZODone (DESYREL) 50 MG tablet Take 50 mg by mouth at bedtime.   Yes Historical Provider, MD  trifluoperazine (STELAZINE) 5 MG tablet Take 5 mg by mouth daily.   Yes Historical Provider, MD  Vortioxetine HBr (BRINTELLIX) 10 MG TABS Take 10 mg by mouth daily.   Yes Historical Provider, MD  zolpidem (AMBIEN) 5 MG tablet Take 5 mg by mouth at bedtime as needed for sleep.   Yes Historical Provider, MD   Physical  Exam: Filed Vitals:   12/20/13 1302  BP: 144/75  Pulse: 90  Temp: 99.3 F (37.4 C)  Resp: 16    BP 144/75  Pulse 90  Temp(Src) 99.3 F (37.4 C) (Oral)  Resp 16  Ht 5\' 3"  (1.6 m)  SpO2 96%  General:  Appears calm, awake and alert but slow to respond to questions and answers questions with yes or no. Eyes: PERRL, non icteric ENT: No masses on visual examination, dry mucous membranes Neck: No thyromegaly Cardiovascular: RRR, no m/r/g. No LE edema. Telemetry: SR, no arrhythmias  Respiratory: CTA bilaterally, no w/r/r. Normal respiratory effort. Abdomen: soft, nt, nd Skin: no rash or induration seen on limited exam Musculoskeletal: No cyanosis Psychiatric: Limited evaluation secondary to metabolic encephalopathy Neurologic: Limited evaluation secondary to an metabolic encephalopathy. Answers questions with one-word yes or no           Labs on Admission:  Basic Metabolic Panel:  Recent Labs Lab 12/20/13 1029  NA 142  K 3.0*  CL 92*  CO2 31  GLUCOSE 157*  BUN 32*  CREATININE 0.81  CALCIUM 10.8*   Liver Function Tests:  Recent Labs Lab 12/20/13 1029  AST 19  ALT 21  ALKPHOS 131*  BILITOT 0.4  PROT 8.5*  ALBUMIN 4.7   No results found for this basename: LIPASE, AMYLASE,  in the last 168 hours  Recent Labs Lab 12/20/13 1029  AMMONIA 27   CBC:  Recent Labs Lab 12/20/13 1029  WBC 12.6*  NEUTROABS 9.9*  HGB 16.3*  HCT 47.4*  MCV 82.1  PLT 314   Cardiac Enzymes: No results found for this basename: CKTOTAL, CKMB, CKMBINDEX, TROPONINI,  in the last 168 hours  BNP (last 3 results) No results found for this basename: PROBNP,  in the last 8760 hours CBG: No results found for this basename: GLUCAP,  in the last 168 hours  Radiological Exams on Admission: Ct Head Wo Contrast  12/20/2013   CLINICAL DATA:  Altered mental status. Two days of fevers and lethargy.  EXAM: CT HEAD WITHOUT CONTRAST  TECHNIQUE: Contiguous axial images were obtained from the  base of the skull through the vertex without intravenous contrast.  COMPARISON:  None.  FINDINGS: There is mild generalized cerebral atrophy, likely within normal limits for age. There is no evidence of acute cortical infarct, intracranial hemorrhage, mass, midline shift, or extra-axial fluid collection. Orbits are unremarkable. Mastoid air cells are clear. Small amount of right sphenoid sinus fluid is noted.  IMPRESSION: 1. No evidence of acute intracranial abnormality. 2. Small amount of right sphenoid sinus fluid. Correlate clinically for signs of acute sinusitis.   Electronically Signed   By: Sebastian Ache   On: 12/20/2013 10:22   Dg Chest Port 1 View  12/20/2013   CLINICAL DATA:  Altered mental status  EXAM: PORTABLE CHEST - 1 VIEW  COMPARISON:  None.  FINDINGS: There is no edema or consolidation. The heart  size and pulmonary vascularity are normal. No adenopathy. No bone lesions. There is postoperative change in the cervical spine.  IMPRESSION: No edema or consolidation.   Electronically Signed   By: Bretta BangWilliam  Woodruff M.D.   On: 12/20/2013 09:56    EKG: Independently reviewed. *Sinus rhythm with nonspecific ST wave changes  Assessment/Plan  Principal problem: Metabolic encephalopathy - Most likely secondary to infectious etiology given UTI. - At this juncture we'll hold off on medications that could depressed mentation like gabapentin, Ambien, Ativan. Once mental status back to baseline may continue these medications. Watch for withdrawal from Ativan -  Please see plan listed below - ammonia levels within normal limits - CT of head reported no evidence of acute intracranial abnormality   Active Problems:   UTI (lower urinary tract infection) - Place order for urine culture - Cover with Rocephin - Tailor antibiotic regimen once sensitivities resulted  DVT prophylaxis - Heparin   Code Status: presumed full Family Communication: None at bedside Disposition Plan: med surg  Time  spent: > 55 minutes  Penny PiaVEGA, Darren Nodal [facility] Pager [phone]  **Disclaimer: This note may have been dictated with voice recognition software. Similar sounding words can inadvertently be transcribed and this note may contain transcription errors which may not have been corrected upon publication of note.**

## 2013-12-20 NOTE — Evaluation (Addendum)
Clinical/Bedside Swallow Evaluation Patient Details  Name: [name] [name] MRN: [id number] Date of Birth: April 11, 1946  Today's Date: 12/20/2013 Time: [id number] SLP Time Calculation (min): 55 min  Past Medical History:  Past Medical History  Diagnosis Date  . GERD (gastroesophageal reflux disease)   . Quadriplegia   . Hypertension   . Pressure ulcer of foot   . Chronic pain disorder   . Hyperlipidemia    Past Surgical History:  Past Surgical History  Procedure Laterality Date  . Back surgery  08/2013  . Abdominal hysterectomy     HPI:  68 year old [facility] with [facility], fever and lethargy x several days.  - ? UTI.  Md ordered swallow evaluation.  Pt PMH gleaned from chart and pt's son + for C5-C7 quad since 1995 s/p six cervical spine surgies per son, GERD.  Pt takes Phenergan at home for nausea- son reports she often requests her reflux and nausea medicine at night.   Per pt and son, dysphagia and taste changes has been an ongoing since her last cervical spine surgery in March 2015.  Pt denies requiring heimlich manuever and denies pill dysphagia.  CXR negative, CT head negative.     Assessment / Plan / Recommendation Clinical Impression  Pt currently presents with significantly delayed responses to SLP questions/requests and son admits this is not her baseline- which likely attributes to her dysphagia.   Negative CN exam except for generalized weakness.  Given pt is a C5-C7 spinal cord injury, she will require assistance to eat.    SLP observed pt with applesauce, icecream, graham cracker and grape juice via cup/straw.  Pt without indication of airway compromise/aspiration but swallow of solid/icecream was intermittently delayed likely due to mental status.  Multiple audible swallows intermittently across consistencies noted which pt states was from oral residuals.  Pt with poor mastication attempt with cracker, allowing it to remain in anterior oral cavity- requiring multiple icecream  boluses to clear.    Pt reported sensation of bolus "coming back up" at end of evaluation.  Delayed verbal responses noted to questions and pt stated it cleared independently.  Difficult to get adequate clinical picture of pt's symptoms due to her responses.  There were not indications of airway infiltration at that time and pt has h/o GERD.    Recommend start full liquid diet with strict asp/reflux precautions and SLP follow up for readiness for dietary advancement and/or indication for instrumental swallowing evaluation.  Anticipate pt's mild oral dysphagia symptoms to resolve quickly with improvement in mental status.  Educated son, pt to findings, recommendations thoroughly.      Aspiration Risk  Mild    Diet Recommendation Thin liquid (full liquids)   Liquid Administration via: Cup;Straw Medication Administration:  (crush with icecream) Supervision: Full supervision/cueing for compensatory strategies;Trained caregiver to feed patient Compensations: Slow rate;Small sips/bites Postural Changes and/or Swallow Maneuvers: Seated upright 90 degrees;Upright 30-60 min after meal    Other  Recommendations Oral Care Recommendations: Oral care BID   Follow Up Recommendations   (tbd)    Frequency and Duration min 2x/week  1 week   Pertinent Vitals/Pain Afebrile,decreased      Swallow Study Prior Functional Status   see HHX - pt has been eating plenty of hot dogs lately per son    General Date of Onset: 12/20/13 HPI: 68 [facility], fever and lethargy x several days.  - ? UTI.  Md ordered swallow evaluation.  Pt  PMH gleaned from chart and pt's son + for C5-C7 quad since 1995 s/p six cervical spine surgies per son, GERD.  Pt takes Phenergan at home for nausea- son reports she often requests her reflux and nausea medicine at night.   Per pt and son, dysphagia and taste changes has been an ongoing since her last cervical spine surgery in March 2015.  Pt denies requiring  heimlich manuever and denies pill dysphagia.  CXR negative, CT head negative.   Type of Study: Bedside swallow evaluation Diet Prior to this Study: NPO Temperature Spikes Noted: No Respiratory Status: Room air History of Recent Intubation: No Behavior/Cognition: Alert;Requires cueing;Other (comment);Confused;Distractible (very delayed responses, pt responds approx 75% of opportunities with frequent delays - son states this is not baseline) Oral Cavity - Dentition: Dentures, bottom;Dentures, top (dentures are mildly loose) Self-Feeding Abilities: Total assist Patient Positioning: Upright in bed Baseline Vocal Quality: Clear;Low vocal intensity Volitional Cough: Weak Volitional Swallow: Unable to elicit    Oral/Motor/Sensory Function Overall Oral Motor/Sensory Function: Appears within functional limits for tasks assessed (no focal CN deficits, generalized weakness)   Ice Chips Ice chips: Within functional limits Presentation: Spoon   Thin Liquid Thin Liquid: Impaired Presentation: Cup;Straw;Spoon Oral Phase Impairments: Impaired anterior to posterior transit;Reduced lingual movement/coordination Oral Phase Functional Implications: Prolonged oral transit Pharyngeal  Phase Impairments: Suspected delayed Swallow;Multiple swallows    Nectar Thick Nectar Thick Liquid: Not tested   Honey Thick Honey Thick Liquid: Not tested   Puree Puree: Impaired Oral Phase Impairments: Reduced lingual movement/coordination;Impaired anterior to posterior transit Oral Phase Functional Implications: Prolonged oral transit Pharyngeal Phase Impairments: Suspected delayed Swallow;Multiple swallows   Solid   GO    Solid: Impaired Oral Phase Impairments: Impaired mastication;Reduced lingual movement/coordination;Impaired anterior to posterior transit Other Comments: pt with very poor mastication abilities- allowed cracker to be retained in anterior oral cavity requiring verbal cues to masticate - required  multiple boluses of icecream to clear        [name] Burnetamara Jolayne Branson, MS [facility] SLP [phone]

## 2013-12-21 DIAGNOSIS — G9341 Metabolic encephalopathy: Secondary | ICD-10-CM

## 2013-12-21 DIAGNOSIS — N390 Urinary tract infection, site not specified: Principal | ICD-10-CM

## 2013-12-21 DIAGNOSIS — E876 Hypokalemia: Secondary | ICD-10-CM

## 2013-12-21 LAB — BASIC METABOLIC PANEL
BUN: 25 mg/dL — AB (ref 6–23)
CHLORIDE: 90 meq/L — AB (ref 96–112)
CO2: 28 mEq/L (ref 19–32)
Calcium: 10.2 mg/dL (ref 8.4–10.5)
Creatinine, Ser: 0.62 mg/dL (ref 0.50–1.10)
GFR calc non Af Amer: 90 mL/min — ABNORMAL LOW (ref >90)
GLUCOSE: 118 mg/dL — AB (ref 70–99)
POTASSIUM: 2.8 meq/L — AB (ref 3.7–5.3)
Sodium: 137 mEq/L (ref 137–147)

## 2013-12-21 LAB — CBC
HEMATOCRIT: 44 % (ref 36.0–46.0)
Hemoglobin: 15.1 g/dL — ABNORMAL HIGH (ref 12.0–15.0)
MCH: 28.3 pg (ref 26.0–34.0)
MCHC: 34.3 g/dL (ref 30.0–36.0)
MCV: 82.6 fL (ref 78.0–100.0)
Platelets: 308 10*3/uL (ref 150–400)
RBC: 5.33 MIL/uL — ABNORMAL HIGH (ref 3.87–5.11)
RDW: 12.7 % (ref 11.5–15.5)
WBC: 12.7 10*3/uL — ABNORMAL HIGH (ref 4.0–10.5)

## 2013-12-21 LAB — URINE CULTURE: Colony Count: >=100000

## 2013-12-21 MED ORDER — ENSURE PUDDING PO PUDG
1.0000 | Freq: Two times a day (BID) | ORAL | Status: DC | PRN
  Filled 2013-12-21: qty 1

## 2013-12-21 MED ORDER — POTASSIUM CHLORIDE CRYS ER 20 MEQ PO TBCR
40.0000 meq | EXTENDED_RELEASE_TABLET | ORAL | Status: AC
  Administered 2013-12-21 (×2): 40 meq via ORAL
  Filled 2013-12-21 (×2): qty 2

## 2013-12-21 MED ORDER — BOOST PLUS PO LIQD
237.0000 mL | Freq: Two times a day (BID) | ORAL | Status: DC
  Filled 2013-12-21 (×3): qty 237

## 2013-12-21 MED ORDER — LORAZEPAM 0.5 MG PO TABS
0.5000 mg | ORAL_TABLET | Freq: Once | ORAL | Status: AC
  Administered 2013-12-21: 0.5 mg via ORAL
  Filled 2013-12-21: qty 1

## 2013-12-21 NOTE — Progress Notes (Signed)
Speech Language Pathology Treatment: Dysphagia  Patient Details Name: [name] [name] MRN: [id number] DOB: 07/11/1945 Today's Date: 12/21/2013 Time: 1300-1340 SLP Time Calculation (min): 40 min  Assessment / Plan / Recommendation Clinical Impression  F/u after 6/16 swallow evaluation.  Pt with improved alertness and response time today, although case manager who has followed pt for years states that pt is is not quite at baseline with mental status.  Pt asking for a regular meal tray.  Provided with upgraded textures today - continues with prolonged mastication of solids, but it is sufficient and safe enough to allow a return to regular textures.  Swallow response appears to be swifter/stronger per palpation with no s/s of airway compromise, even when taxing swallow mechanism with large, successive boluses.  Recommend advancing diet to regular consistency, thin liquids; requires full assistance with feeding given quadriplegia.  Continue reflux precautions.  No further SLP f/u warranted.  Will sign off.  Pt in agreement with recs.   HPI HPI: 68 year old female adm to [facility] with AMS, fever and lethargy x several days.  - ? UTI.  Md ordered swallow evaluation.  Pt PMH gleaned from chart and pt's son + for C5-C7 quad since 1995 s/p six cervical spine surgies per son, GERD.  Pt takes Phenergan at home for nausea- son reports she often requests her reflux and nausea medicine at night.   Per pt and son, dysphagia and taste changes has been an ongoing since her last cervical spine surgery in March 2015.  Pt denies requiring heimlich manuever and denies pill dysphagia.  CXR negative, CT head negative.        SLP Plan  All goals met    Recommendations Diet recommendations: Regular;Thin liquid Liquids provided via: Straw Medication Administration: Whole meds with liquid Supervision: Full supervision/cueing for compensatory strategies;Trained caregiver to feed patient Postural Changes and/or Swallow Maneuvers:  Seated upright 90 degrees;Upright 30-60 min after meal              Oral Care Recommendations: Oral care BID Follow up Recommendations: None Plan: All goals met   [name] [name], [place] CCC/SLP Pager [phone]      Juan Quam Laurice 12/21/2013, 1:46 PM

## 2013-12-21 NOTE — Progress Notes (Signed)
INITIAL NUTRITION ASSESSMENT  DOCUMENTATION CODES Per approved criteria  -Not Applicable   INTERVENTION:  Continue Full Liquid diet per SLP  Continue MVI daily  Continue Ensure pudding bid  Boost bid  NUTRITION DIAGNOSIS: Inadequate oral intake related to nausea and AMS as evidenced by vomiting and inability to swallow solids..   Goal: Tolerate diet advancement with intake of meals and supplements to meet >90% estimated needs.  Monitor:  Intake, labs, weight  Reason for Assessment: Low Braden  68 y.o. female  Admitting Dx: <principal problem not specified>  ASSESSMENT: Patient with a history of C5-7 quadriplegia lives at home with caregiver.  Admitted with AMS, fever and decreased responsiveness related to UTI.    -Patient s/p swallow eval by SLP who recommended a full liquid diet.  Patient was unable to clear crackers without several tsp of ice cream.  -lying flat in bed and had just vomited when I saw patient.  Stated that she was nauseated and vomited meds. -Fed patient full liquid diet with intake of 100% pudding and 75% jello but did not want to eat more for fear of vomiting.  Denied nausea. -good intake prior to admit with UBW of 166 lbs.    Height: Ht Readings from Last 1 Encounters:  12/20/13 5\' 3"  (1.6 m)    Weight: Wt Readings from Last 1 Encounters:  No data found for Wt    Ideal Body Weight: 115 lbs  % Ideal Body Weight: 144  Wt Readings from Last 10 Encounters:  No data found for Wt    Usual Body Weight: 166 lbs  % Usual Body Weight: unknown  BMI:  There is no weight on file to calculate BMI. BMI of 29 based on patient's stated weight.  Estimated Nutritional Needs: Kcal: 1550-1650 Protein: 70-80 gm Fluid: >1.6L daily  Skin: intact  Diet Order: Full Liquid  EDUCATION NEEDS: -No education needs identified at this time   Intake/Output Summary (Last 24 hours) at 12/21/13 1318 Last data filed at 12/21/13 0700  Gross per 24 hour   Intake 1868.75 ml  Output      0 ml  Net 1868.75 ml    Labs:   Recent Labs Lab 12/20/13 1029 12/20/13 1442 12/21/13 0458  NA 142  --  137  K 3.0*  --  2.8*  CL 92*  --  90*  CO2 31  --  28  BUN 32*  --  25*  CREATININE 0.81  --  0.62  CALCIUM 10.8*  --  10.2  MG  --  2.5  --   PHOS  --  4.7*  --   GLUCOSE 157*  --  118*    CBG (last 3)  No results found for this basename: GLUCAP,  in the last 72 hours  Scheduled Meds: . aspirin EC  81 mg Oral Daily  . cefTRIAXone (ROCEPHIN)  IV  1 g Intravenous Q24H  . ezetimibe  10 mg Oral Daily  . feeding supplement (ENSURE)  1 Container Oral TID BM  . heparin  5,000 Units Subcutaneous 3 times per day  . magnesium oxide  400 mg Oral Daily  . multivitamin with minerals  1 tablet Oral Daily  . pantoprazole  40 mg Oral Daily  . potassium chloride SA  20 mEq Oral BID  . tamsulosin  0.4 mg Oral Daily  . torsemide  20 mg Oral Daily  . trifluoperazine  5 mg Oral Daily  . Vortioxetine HBr  10 mg Oral Daily  Continuous Infusions: . dextrose 5 % and 0.45 % NaCl with KCl 20 mEq/L 75 mL/hr at 12/21/13 0533    Past Medical History  Diagnosis Date  . GERD (gastroesophageal reflux disease)   . Quadriplegia   . Hypertension   . Pressure ulcer of foot   . Chronic pain disorder   . Hyperlipidemia     Past Surgical History  Procedure Laterality Date  . Back surgery  08/2013  . Abdominal hysterectomy      Oran ReinLaura Jobe, RD, LDN Clinical Inpatient Dietitian Pager:  [phone] Weekend and after hours pager:  [phone]

## 2013-12-21 NOTE — Progress Notes (Addendum)
[facility] PROGRESS NOTE  [name] MeekerLinda [name] ZOX:[id number] DOB: 01/17/1946 DOA: 12/20/2013 PCP: [name],SAMUEL, MD  Assessment/Plan: Acute Encephalopathy -Per son, about 85 % better. -Suspect related to her UTI.  UTI -Cx with micrococcus. -Will keep on rocephin today and transition to PO antibiotics in am for DC home.  Hypokalemia -Replete PO. -Mg levels ok. -Recheck BMET in am.  Code Status: Full Code Family Communication: Discussed with son Ruel FavorsJim Poe at bedside.  Disposition Plan: Home in am   Consultants:  None   Antibiotics:  Rocephin   Subjective: No complaints.  Objective: Filed Vitals:   12/20/13 1356 12/20/13 2142 12/21/13 0604 12/21/13 1300  BP: 150/70 153/74 140/68 140/66  Pulse: 88 84 70 68  Temp: 98.5 F (36.9 C) 98.1 F (36.7 C) 98.1 F (36.7 C) 97.6 F (36.4 C)  TempSrc: Oral Oral Oral Oral  Resp: 18 18 18 16   Height: 5\' 3"  (1.6 m)     SpO2: 98% 93% 95% 97%    Intake/Output Summary (Last 24 hours) at 12/21/13 1557 Last data filed at 12/21/13 0700  Gross per 24 hour  Intake 1368.75 ml  Output      0 ml  Net 1368.75 ml   There were no vitals filed for this visit.  Exam:   General:  AA Ox3  Cardiovascular: RRR  Respiratory: CTA B  Abdomen: S/NT/ND/+BS  Extremities: no C/C/E   Neurologic:  Non-focal  Data Reviewed: Basic Metabolic Panel:  Recent Labs Lab 12/20/13 1029 12/20/13 1442 12/21/13 0458  NA 142  --  137  K 3.0*  --  2.8*  CL 92*  --  90*  CO2 31  --  28  GLUCOSE 157*  --  118*  BUN 32*  --  25*  CREATININE 0.81  --  0.62  CALCIUM 10.8*  --  10.2  MG  --  2.5  --   PHOS  --  4.7*  --    Liver Function Tests:  Recent Labs Lab 12/20/13 1029  AST 19  ALT 21  ALKPHOS 131*  BILITOT 0.4  PROT 8.5*  ALBUMIN 4.7   No results found for this basename: LIPASE, AMYLASE,  in the last 168 hours  Recent Labs Lab 12/20/13 1029  AMMONIA 27   CBC:  Recent Labs Lab 12/20/13 1029 12/21/13 0458    WBC 12.6* 12.7*  NEUTROABS 9.9*  --   HGB 16.3* 15.1*  HCT 47.4* 44.0  MCV 82.1 82.6  PLT 314 308   Cardiac Enzymes: No results found for this basename: CKTOTAL, CKMB, CKMBINDEX, TROPONINI,  in the last 168 hours BNP (last 3 results) No results found for this basename: PROBNP,  in the last 8760 hours CBG: No results found for this basename: GLUCAP,  in the last 168 hours  Recent Results (from the past 240 hour(s))  URINE CULTURE     Status: None   Collection Time    12/20/13  9:57 AM      Result Value Ref Range Status   Specimen Description URINE, CLEAN CATCH   Final   Special Requests NONE   Final   Culture  Setup Time     Final   Value: 12/20/2013 13:09     Performed at [facility]   Colony Count     Final   Value: >=100,000 COLONIES/ML     Performed at [facility]   Culture     Final   Value: MICROCOCCUS SPECIES  Note: Standardized susceptibility testing for this organism is not available.     Performed at [facility]   Report Status 12/21/2013 FINAL   Final     Studies: Ct Head Wo Contrast  12/20/2013   CLINICAL DATA:  Altered mental status. Two days of fevers and lethargy.  EXAM: CT HEAD WITHOUT CONTRAST  TECHNIQUE: Contiguous axial images were obtained from the base of the skull through the vertex without intravenous contrast.  COMPARISON:  None.  FINDINGS: There is mild generalized cerebral atrophy, likely within normal limits for age. There is no evidence of acute cortical infarct, intracranial hemorrhage, mass, midline shift, or extra-axial fluid collection. Orbits are unremarkable. Mastoid air cells are clear. Small amount of right sphenoid sinus fluid is noted.  IMPRESSION: 1. No evidence of acute intracranial abnormality. 2. Small amount of right sphenoid sinus fluid. Correlate clinically for signs of acute sinusitis.   Electronically Signed   By: Sebastian AcheAllen  Grady   On: 12/20/2013 10:22   Dg Chest Port 1 View  12/20/2013   CLINICAL DATA:   Altered mental status  EXAM: PORTABLE CHEST - 1 VIEW  COMPARISON:  None.  FINDINGS: There is no edema or consolidation. The heart size and pulmonary vascularity are normal. No adenopathy. No bone lesions. There is postoperative change in the cervical spine.  IMPRESSION: No edema or consolidation.   Electronically Signed   By: Bretta BangWilliam  Woodruff M.D.   On: 12/20/2013 09:56    Scheduled Meds: . aspirin EC  81 mg Oral Daily  . cefTRIAXone (ROCEPHIN)  IV  1 g Intravenous Q24H  . ezetimibe  10 mg Oral Daily  . heparin  5,000 Units Subcutaneous 3 times per day  . lactose free nutrition  237 mL Oral BID BM  . magnesium oxide  400 mg Oral Daily  . multivitamin with minerals  1 tablet Oral Daily  . pantoprazole  40 mg Oral Daily  . potassium chloride SA  20 mEq Oral BID  . tamsulosin  0.4 mg Oral Daily  . torsemide  20 mg Oral Daily  . trifluoperazine  5 mg Oral Daily  . Vortioxetine HBr  10 mg Oral Daily   Continuous Infusions: . dextrose 5 % and 0.45 % NaCl with KCl 20 mEq/L 75 mL/hr at 12/21/13 0533    Active Problems:   UTI (lower urinary tract infection)   Metabolic encephalopathy    Time spent: 35 minutes. Greater than 50% of this time was spent in direct contact with the patient coordinating care.    [name],[name]  [facility] Pager [phone]  If 7PM-7AM, please contact night-coverage at [email or web address], password [email or web address] 12/21/2013, 3:57 PM  LOS: 1 day

## 2013-12-21 NOTE — Care Management Note (Unsigned)
    Page 1 of 1   12/22/2013     3:32:18 PM CARE MANAGEMENT NOTE 12/22/2013  Patient:  [name] [name], [name] [name]   Account Number:  [id number]  Date Initiated:  12/21/2013  Documentation initiated by:  [facility]  Subjective/Objective Assessment:   68 year old female admitted with Metabolic encephalopathy  - Most likely secondary to infectious etiology given UTI.     Action/Plan:   From home with 24/7 caregivers from [place] paid for by Workers Comp.   Anticipated DC Date:  12/22/2013   Anticipated DC Plan:  [place]  CM consult      Choice offered to / List presented to:             Status of service:  In process, will continue to follow Medicare Important Message given?   (If response is "NO", the following Medicare IM given date fields will be blank) Date Medicare IM given:   Date Additional Medicare IM given:    Discharge Disposition:    Per UR Regulation:  Reviewed for med. necessity/level of care/duration of stay  If discussed at [place] of Stay Meetings, dates discussed:    Comments:  12/22/13 Allene Dillon RN BSN [phone] H&P, d/c summary and [place] orders faxed to [name] [name] worker's comp case Freight forwarder. She will arrange transportation for 4pm pick up. Bedside RN is aware.  12/21/13 Allene Dillon RN BSN [phone] Met with pt at bedside together with her worker's comp RN Case-manager [name] [name]. Pt was alert and oriented. Stated she felt better. Per [name] [name], pt has 24/7 CNA caregiver's from [place] paid for through [employer]. She has all the needed DME at home. She also has a supportive son [name] [name]. They will need orders to resume the services at d/c. Pt gave permission for all her care to be discussed with [name] [name] her case-manager.

## 2013-12-22 LAB — CBC
HEMATOCRIT: 44.5 % (ref 36.0–46.0)
Hemoglobin: 14.9 g/dL (ref 12.0–15.0)
MCH: 27.6 pg (ref 26.0–34.0)
MCHC: 33.5 g/dL (ref 30.0–36.0)
MCV: 82.4 fL (ref 78.0–100.0)
Platelets: 294 10*3/uL (ref 150–400)
RBC: 5.4 MIL/uL — ABNORMAL HIGH (ref 3.87–5.11)
RDW: 12.7 % (ref 11.5–15.5)
WBC: 12.9 10*3/uL — AB (ref 4.0–10.5)

## 2013-12-22 LAB — BASIC METABOLIC PANEL
BUN: 16 mg/dL (ref 6–23)
CHLORIDE: 92 meq/L — AB (ref 96–112)
CO2: 27 meq/L (ref 19–32)
Calcium: 10.2 mg/dL (ref 8.4–10.5)
Creatinine, Ser: 0.6 mg/dL (ref 0.50–1.10)
GFR calc Af Amer: >90 mL/min (ref >90)
GFR calc non Af Amer: >90 mL/min (ref >90)
Glucose, Bld: 123 mg/dL — ABNORMAL HIGH (ref 70–99)
Potassium: 3.5 mEq/L — ABNORMAL LOW (ref 3.7–5.3)
Sodium: 138 mEq/L (ref 137–147)

## 2013-12-22 MED ORDER — CEPHALEXIN 500 MG PO CAPS: 500.0000 mg | ORAL_CAPSULE | Freq: Two times a day (BID) | ORAL | Status: DC

## 2013-12-22 NOTE — Progress Notes (Signed)
Patient given discharge instructions, and verbalized an understanding of all discharge instructions.  Patient agrees with discharge plan, and is being discharged in stable medical condition.  Patient's son transported patient via wheelchair.  Philomena Dohenyavid Block RN

## 2013-12-22 NOTE — Discharge Summary (Signed)
Physician Discharge Summary  [name] [name] BJY:[id number] DOB: 1945-08-08 DOA: 12/20/2013  PCP: [name], MD  Admit date: 12/20/2013 Discharge date: 12/22/2013  Time spent: 45 minutes  Recommendations for Outpatient Follow-up:  -Will be discharged home today. -7 more days of keflex for UTI.   Discharge Diagnoses:  Active Problems:   UTI (lower urinary tract infection)   Metabolic encephalopathy   Hypokalemia   Discharge Condition: Stable and improved  There were no vitals filed for this visit.  History of present illness:  [name] [name] is a 68 y.o. 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 Reportedly EMS told patient had fever and decreased responsiveness  With limited history given that patient moved here recently. Caregiver also is not present and patient currently has altered mental status is such is unable to provide history. Much of the history is obtained from EMR and discussion with the ED physician. As listed above patient was brought to the ED for further evaluation due to fever and decreased responsiveness. Urinalysis revealed positive nitrite light large leukocytes many bacteria and small hemoglobin on urine dipstick  Upon further evaluation patient was found to have elevated white blood cell count of 12.6 but currently not meeting sepsis criteria based on the most recent vitals. Hence we have been consulted for further admission evaluation and recommendations.   Hospital Course:   Acute Encephalopathy  -Per son, completely resolved and back to her baseline. -Suspect related to her UTI.   UTI  -Cx with micrococcus.  -Received 2 days of rocephin. Will transition to keflex for 7 more days on DC.  Hypokalemia  -Repleted.  -Mg levels ok.    Procedures:  None   Consultations:  None  Discharge Instructions      Discharge Instructions   Discontinue IV    Complete by:  As directed      Increase activity slowly     Complete by:  As directed             Medication List         aspirin EC 81 MG tablet  Take 81 mg by mouth every morning.     bisacodyl 10 MG suppository  Commonly known as:  DULCOLAX  Place 10 mg rectally daily as needed for mild constipation or moderate constipation.     BRINTELLIX 10 MG Tabs  Generic drug:  Vortioxetine HBr  Take 10 mg by mouth every morning.     cephALEXin 500 MG capsule  Commonly known as:  KEFLEX  Take 1 capsule (500 mg total) by mouth 2 (two) times daily.     ezetimibe 10 MG tablet  Commonly known as:  ZETIA  Take 10 mg by mouth daily.     gabapentin 300 MG capsule  Commonly known as:  NEURONTIN  Take 300 mg by mouth 3 (three) times daily.     LORazepam 1 MG tablet  Commonly known as:  ATIVAN  Take 1 mg by mouth 4 (four) times daily as needed for anxiety.     magnesium oxide 400 MG tablet  Commonly known as:  MAG-OX  Take 400 mg by mouth every morning.     multivitamin with minerals Tabs tablet  Take 1 tablet by mouth at bedtime.     omeprazole 20 MG capsule  Commonly known as:  PRILOSEC  Take 20 mg by mouth daily.     potassium chloride SA 20 MEQ tablet  Commonly known as:  K-DUR,KLOR-CON  Take 20  mEq by mouth 2 (two) times daily.     promethazine 25 MG tablet  Commonly known as:  PHENERGAN  Take 25 mg by mouth every 6 (six) hours as needed for nausea or vomiting.     senna 8.6 MG tablet  Commonly known as:  SENOKOT  Take 1 tablet by mouth daily as needed for constipation.     tamsulosin 0.4 MG Caps capsule  Commonly known as:  FLOMAX  Take 0.4 mg by mouth every morning.     torsemide 20 MG tablet  Commonly known as:  DEMADEX  Take 20 mg by mouth every morning.     traZODone 50 MG tablet  Commonly known as:  DESYREL  Take 50 mg by mouth at bedtime.     trifluoperazine 5 MG tablet  Commonly known as:  STELAZINE  Take 5 mg by mouth every morning.     zolpidem 5 MG tablet  Commonly known as:  AMBIEN  Take 5 mg by mouth  at bedtime as needed for sleep.       Allergies  Allergen Reactions  . Decadron [Dexamethasone]     unknown  . Sodium Phosphate     unknown   Follow-up Information   [facility]WS[facility]EL, MD On 01/09/2014. (Next follow up appointment with pcp Dr Benancio DeedsNewsome is on January 09 2014 at 2 pm )    Specialty:  Family Medicine   Contact information:   [address] [place] [address] [phone]        The results of significant diagnostics from this hospitalization (including imaging, microbiology, ancillary and laboratory) are listed below for reference.    Significant Diagnostic Studies: Ct Head Wo Contrast  12/20/2013   CLINICAL DATA:  Altered mental status. Two days of fevers and lethargy.  EXAM: CT HEAD WITHOUT CONTRAST  TECHNIQUE: Contiguous axial images were obtained from the base of the skull through the vertex without intravenous contrast.  COMPARISON:  None.  FINDINGS: There is mild generalized cerebral atrophy, likely within normal limits for age. There is no evidence of acute cortical infarct, intracranial hemorrhage, mass, midline shift, or extra-axial fluid collection. Orbits are unremarkable. Mastoid air cells are clear. Small amount of right sphenoid sinus fluid is noted.  IMPRESSION: 1. No evidence of acute intracranial abnormality. 2. Small amount of right sphenoid sinus fluid. Correlate clinically for signs of acute sinusitis.   Electronically Signed   By: Sebastian AcheAllen  Grady   On: 12/20/2013 10:22   Dg Chest Port 1 View  12/20/2013   CLINICAL DATA:  Altered mental status  EXAM: PORTABLE CHEST - 1 VIEW  COMPARISON:  None.  FINDINGS: There is no edema or consolidation. The heart size and pulmonary vascularity are normal. No adenopathy. No bone lesions. There is postoperative change in the cervical spine.  IMPRESSION: No edema or consolidation.   Electronically Signed   By: Bretta BangWilliam  Woodruff M.D.   On: 12/20/2013 09:56    Microbiology: Recent Results (from the past 240 hour(s))    URINE CULTURE     Status: None   Collection Time    12/20/13  9:57 AM      Result Value Ref Range Status   Specimen Description URINE, CLEAN CATCH   Final   Special Requests NONE   Final   Culture  Setup Time     Final   Value: 12/20/2013 13:09     Performed at [facility]   Colony Count     Final  Value: >=100,000 COLONIES/ML     Performed at [facility]   Culture     Final   Value: MICROCOCCUS SPECIES     Note: Standardized susceptibility testing for this organism is not available.     Performed at [facility]   Report Status 12/21/2013 FINAL   Final     Labs: Basic Metabolic Panel:  Recent Labs Lab 12/20/13 1029 12/20/13 1442 12/21/13 0458 12/22/13 0521  NA 142  --  137 138  K 3.0*  --  2.8* 3.5*  CL 92*  --  90* 92*  CO2 31  --  28 27  GLUCOSE 157*  --  118* 123*  BUN 32*  --  25* 16  CREATININE 0.81  --  0.62 0.60  CALCIUM 10.8*  --  10.2 10.2  MG  --  2.5  --   --   PHOS  --  4.7*  --   --    Liver Function Tests:  Recent Labs Lab 12/20/13 1029  AST 19  ALT 21  ALKPHOS 131*  BILITOT 0.4  PROT 8.5*  ALBUMIN 4.7   No results found for this basename: LIPASE, AMYLASE,  in the last 168 hours  Recent Labs Lab 12/20/13 1029  AMMONIA 27   CBC:  Recent Labs Lab 12/20/13 1029 12/21/13 0458 12/22/13 0521  WBC 12.6* 12.7* 12.9*  NEUTROABS 9.9*  --   --   HGB 16.3* 15.1* 14.9  HCT 47.4* 44.0 44.5  MCV 82.1 82.6 82.4  PLT 314 308 294   Cardiac Enzymes: No results found for this basename: CKTOTAL, CKMB, CKMBINDEX, TROPONINI,  in the last 168 hours BNP: BNP (last 3 results) No results found for this basename: PROBNP,  in the last 8760 hours CBG: No results found for this basename: GLUCAP,  in the last 168 hours     Signed:  [name],[name]  [facility] Pager: [phone] 12/22/2013, 2:51 PM

## 2013-12-23 ENCOUNTER — Encounter: Payer: Self-pay | Admitting: Podiatry

## 2014-01-10 ENCOUNTER — Encounter (HOSPITAL_BASED_OUTPATIENT_CLINIC_OR_DEPARTMENT_OTHER): Payer: Medicare Other | Attending: General Surgery

## 2014-01-10 DIAGNOSIS — L89609 Pressure ulcer of unspecified heel, unspecified stage: Secondary | ICD-10-CM | POA: Diagnosis not present

## 2014-01-10 DIAGNOSIS — L8992 Pressure ulcer of unspecified site, stage 2: Secondary | ICD-10-CM | POA: Insufficient documentation

## 2014-01-10 DIAGNOSIS — L89309 Pressure ulcer of unspecified buttock, unspecified stage: Secondary | ICD-10-CM | POA: Insufficient documentation

## 2014-01-16 ENCOUNTER — Emergency Department (HOSPITAL_COMMUNITY)
Admission: EM | Admit: 2014-01-16 | Discharge: 2014-01-16 | Disposition: A | Discharge status: Home / Self Care | Payer: Medicare Other | Attending: Emergency Medicine | Admitting: Emergency Medicine

## 2014-01-16 ENCOUNTER — Encounter (HOSPITAL_COMMUNITY): Payer: Self-pay | Admitting: Emergency Medicine

## 2014-01-16 DIAGNOSIS — E876 Hypokalemia: Secondary | ICD-10-CM

## 2014-01-16 DIAGNOSIS — Z8669 Personal history of other diseases of the nervous system and sense organs: Secondary | ICD-10-CM | POA: Insufficient documentation

## 2014-01-16 DIAGNOSIS — Z79899 Other long term (current) drug therapy: Secondary | ICD-10-CM | POA: Insufficient documentation

## 2014-01-16 DIAGNOSIS — Z872 Personal history of diseases of the skin and subcutaneous tissue: Secondary | ICD-10-CM | POA: Insufficient documentation

## 2014-01-16 DIAGNOSIS — Z7982 Long term (current) use of aspirin: Secondary | ICD-10-CM | POA: Insufficient documentation

## 2014-01-16 DIAGNOSIS — I10 Essential (primary) hypertension: Secondary | ICD-10-CM | POA: Insufficient documentation

## 2014-01-16 DIAGNOSIS — Z87891 Personal history of nicotine dependence: Secondary | ICD-10-CM | POA: Insufficient documentation

## 2014-01-16 DIAGNOSIS — G8929 Other chronic pain: Secondary | ICD-10-CM | POA: Insufficient documentation

## 2014-01-16 LAB — URINALYSIS, ROUTINE W REFLEX MICROSCOPIC
Bilirubin Urine: NEGATIVE
Glucose, UA: NEGATIVE mg/dL
Hgb urine dipstick: NEGATIVE
Ketones, ur: NEGATIVE mg/dL
Leukocytes, UA: NEGATIVE
Nitrite: NEGATIVE
Protein, ur: NEGATIVE mg/dL
Specific Gravity, Urine: 1.007 (ref 1.005–1.030)
Urobilinogen, UA: 0.2 mg/dL (ref 0.0–1.0)
pH: 7.5 (ref 5.0–8.0)

## 2014-01-16 LAB — CBC WITH DIFFERENTIAL/PLATELET
Basophils Absolute: 0.1 10*3/uL (ref 0.0–0.1)
Basophils Relative: 1 % (ref 0–1)
Eosinophils Absolute: 0.6 10*3/uL (ref 0.0–0.7)
Eosinophils Relative: 6 % — ABNORMAL HIGH (ref 0–5)
HCT: 39.4 % (ref 36.0–46.0)
Hemoglobin: 13.2 g/dL (ref 12.0–15.0)
Lymphocytes Relative: 25 % (ref 12–46)
Lymphs Abs: 2.5 10*3/uL (ref 0.7–4.0)
MCH: 27.1 pg (ref 26.0–34.0)
MCHC: 33.5 g/dL (ref 30.0–36.0)
MCV: 80.9 fL (ref 78.0–100.0)
Monocytes Absolute: 0.7 10*3/uL (ref 0.1–1.0)
Monocytes Relative: 7 % (ref 3–12)
Neutro Abs: 6.2 10*3/uL (ref 1.7–7.7)
Neutrophils Relative %: 61 % (ref 43–77)
Platelets: 254 10*3/uL (ref 150–400)
RBC: 4.87 MIL/uL (ref 3.87–5.11)
RDW: 12.9 % (ref 11.5–15.5)
WBC: 10.1 10*3/uL (ref 4.0–10.5)

## 2014-01-16 LAB — COMPREHENSIVE METABOLIC PANEL
ALT: 17 U/L (ref 0–35)
AST: 18 U/L (ref 0–37)
Albumin: 3.2 g/dL — ABNORMAL LOW (ref 3.5–5.2)
Alkaline Phosphatase: 113 U/L (ref 39–117)
Anion gap: 16 — ABNORMAL HIGH (ref 5–15)
BUN: 15 mg/dL (ref 6–23)
CO2: 31 mEq/L (ref 19–32)
Calcium: 9.4 mg/dL (ref 8.4–10.5)
Chloride: 90 mEq/L — ABNORMAL LOW (ref 96–112)
Creatinine, Ser: 0.51 mg/dL (ref 0.50–1.10)
GFR calc Af Amer: >90 mL/min (ref >90)
GFR calc non Af Amer: >90 mL/min (ref >90)
Glucose, Bld: 111 mg/dL — ABNORMAL HIGH (ref 70–99)
Potassium: 2.5 mEq/L — CL (ref 3.7–5.3)
Sodium: 137 mEq/L (ref 137–147)
Total Bilirubin: 0.4 mg/dL (ref 0.3–1.2)
Total Protein: 6.6 g/dL (ref 6.0–8.3)

## 2014-01-16 LAB — MAGNESIUM: Magnesium: 2.3 mg/dL (ref 1.5–2.5)

## 2014-01-16 MED ORDER — POTASSIUM CHLORIDE CRYS ER 20 MEQ PO TBCR
60.0000 meq | EXTENDED_RELEASE_TABLET | Freq: Once | ORAL | Status: AC
  Administered 2014-01-16: 60 meq via ORAL
  Filled 2014-01-16: qty 3

## 2014-01-16 MED ORDER — POTASSIUM CHLORIDE 10 MEQ/100ML IV SOLN
10.0000 meq | Freq: Once | INTRAVENOUS | Status: AC
  Administered 2014-01-16: 10 meq via INTRAVENOUS
  Filled 2014-01-16: qty 100

## 2014-01-16 NOTE — ED Notes (Signed)
Bed: WA03 Expected date:  Expected time:  Means of arrival:  Comments: EMS- abdominal distention, diarrhea

## 2014-01-16 NOTE — ED Notes (Signed)
Potassium infusion in progress; will discharge when complete

## 2014-01-16 NOTE — ED Notes (Signed)
Pt discharged home with caregiver and son via [facility]; states feels better; no complaints of distress

## 2014-01-16 NOTE — ED Notes (Signed)
[facility] notified of need for transport home 

## 2014-01-16 NOTE — ED Notes (Signed)
Per EMS pt from home with Hx of quadriplegia c/o abdominal distention and discomfort x 3 days, stool softeners administered, non-stop diarrhea since.  Pt takes pain medications long term and suffers chronic constipation.

## 2014-01-16 NOTE — Discharge Instructions (Signed)

## 2014-01-16 NOTE — ED Notes (Signed)
Pt resting; denies abd pain at present; no diarrhea at present; caregivers at bedside;

## 2014-01-16 NOTE — ED Notes (Signed)
Pt previous iv removed right hand due to increased pain; discoloration; restarted to right medial side of hand; potassium via pump infusing without difficulty; infusing without pain

## 2014-01-17 DIAGNOSIS — L89309 Pressure ulcer of unspecified buttock, unspecified stage: Secondary | ICD-10-CM | POA: Diagnosis not present

## 2014-01-17 DIAGNOSIS — L89609 Pressure ulcer of unspecified heel, unspecified stage: Secondary | ICD-10-CM | POA: Diagnosis not present

## 2014-01-17 DIAGNOSIS — L8992 Pressure ulcer of unspecified site, stage 2: Secondary | ICD-10-CM | POA: Diagnosis not present

## 2014-01-26 NOTE — ED Provider Notes (Signed)
CSN: [id number]     Arrival date & time 01/16/14  [id number] History   First MD Initiated Contact with Patient 01/16/14 [phone]     Chief Complaint  Patient presents with  . Diarrhea     (Consider location/radiation/quality/duration/timing/severity/associated sxs/prior Treatment) HPI  68 year old female with diarrhea. Patient is chronically on pain medications and had issues with constipation. Recently given stool softeners/laxatives and since then has been having multiple loose stools. No blood, melena and denies fever. Denies any significant abdominal pain. No fevers or chills. No nausea or vomiting. Abdomen does seem bloated. No sick contacts.  Past Medical History  Diagnosis Date  . GERD (gastroesophageal reflux disease)   . Quadriplegia   . Hypertension   . Pressure ulcer of foot   . Chronic pain disorder   . Hyperlipidemia    Past Surgical History  Procedure Laterality Date  . Back surgery  08/2013  . Abdominal hysterectomy     Family History  Problem Relation Age of Onset  . Adopted: Yes   History  Substance Use Topics  . Smoking status: Former Smoker -- 0.25 packs/day    Types: Cigarettes  . Smokeless tobacco: Never Used  . Alcohol Use: No   OB History   Grav Para Term Preterm Abortions TAB SAB Ect Mult Living                 Review of Systems  All systems reviewed and negative, other than as noted in HPI.   Allergies  Decadrol; Decadron; and Sodium phosphate  Home Medications   Prior to Admission medications   Medication Sig Start Date End Date Taking? Authorizing Provider  aspirin EC 81 MG tablet Take 81 mg by mouth every morning.    Yes Historical Provider, MD  baclofen (LIORESAL) 10 MG tablet Take 10 mg by mouth 3 (three) times daily.   Yes Historical Provider, MD  Choline Fenofibrate (TRILIPIX) 135 MG capsule Take 135 mg by mouth every morning.    Yes Historical Provider, MD  ezetimibe (ZETIA) 10 MG tablet Take 10 mg by mouth every morning.    Yes  Historical Provider, MD  gabapentin (NEURONTIN) 300 MG capsule Take 300 mg by mouth 3 (three) times daily.   Yes Historical Provider, MD  LORazepam (ATIVAN) 1 MG tablet Take 1 mg by mouth 4 (four) times daily.    Yes Historical Provider, MD  magnesium oxide (MAG-OX) 400 MG tablet Take 400 mg by mouth 2 (two) times daily.   Yes Historical Provider, MD  metolazone (ZAROXOLYN) 2.5 MG tablet Take 2.5 mg by mouth every morning.    Yes Historical Provider, MD  Multiple Vitamin (MULTIVITAMIN WITH MINERALS) TABS tablet Take 1 tablet by mouth every morning.    Yes Historical Provider, MD  omeprazole (PRILOSEC) 20 MG capsule Take 20 mg by mouth every morning.    Yes Historical Provider, MD  OxyCODONE (OXYCONTIN) 20 mg T12A 12 hr tablet Take 40 mg by mouth 2 (two) times daily.    Yes Historical Provider, MD  polyethylene glycol (MIRALAX / GLYCOLAX) packet Take 17 g by mouth daily as needed for mild constipation.   Yes Historical Provider, MD  potassium chloride SA (K-DUR,KLOR-CON) 20 MEQ tablet Take 20 mEq by mouth 3 (three) times daily.   Yes Historical Provider, MD  promethazine (PHENERGAN) 25 MG tablet Take 25 mg by mouth every 6 (six) hours as needed for nausea or vomiting.   Yes Historical Provider, MD  sodium phosphate (FLEET) enema Place 1 enema  rectally daily as needed (constipation). follow package directions   Yes Historical Provider, MD  spironolactone (ALDACTONE) 25 MG tablet Take 25 mg by mouth 2 (two) times daily.   Yes Historical Provider, MD  tamsulosin (FLOMAX) 0.4 MG CAPS capsule Take 0.4 mg by mouth every morning.    Yes Historical Provider, MD  torsemide (DEMADEX) 20 MG tablet Take 20 mg by mouth every morning.    Yes Historical Provider, MD  traZODone (DESYREL) 50 MG tablet Take 50 mg by mouth at bedtime.   Yes Historical Provider, MD  trifluoperazine (STELAZINE) 5 MG tablet Take 5 mg by mouth every morning.    Yes Historical Provider, MD  Vortioxetine HBr (BRINTELLIX) 10 MG TABS Take by  mouth 3 (three) times daily.   Yes Historical Provider, MD  zolpidem (AMBIEN) 5 MG tablet Take 5 mg by mouth at bedtime as needed for sleep.   Yes Historical Provider, MD   BP 110/47  Pulse 78  Temp(Src) 98.7 F (37.1 C) (Oral)  Resp 20  SpO2 96% Physical Exam  Nursing note and vitals reviewed. Constitutional: She appears well-developed. No distress.  HENT:  Head: Normocephalic and atraumatic.  Eyes: Conjunctivae are normal. Right eye exhibits no discharge. Left eye exhibits no discharge.  Neck: Neck supple.  Cardiovascular: Normal rate, regular rhythm and normal heart sounds.  Exam reveals no gallop and no friction rub.   No murmur heard. Pulmonary/Chest: Effort normal and breath sounds normal. No respiratory distress.  Abdominal: Soft. She exhibits distension. There is no tenderness.  Abdomen is perhaps mildly distended. Soft. No tenderness.  Musculoskeletal: She exhibits no edema and no tenderness.  Neurological: She is alert.  Skin: Skin is warm and dry.  Psychiatric: She has a normal mood and affect. Her behavior is normal. Thought content normal.    ED Course  Procedures (including critical care time) Labs Review Labs Reviewed  CBC WITH DIFFERENTIAL - Abnormal; Notable for the following:    Eosinophils Relative 6 (*)    All other components within normal limits  COMPREHENSIVE METABOLIC PANEL - Abnormal; Notable for the following:    Potassium 2.5 (*)    Chloride 90 (*)    Glucose, Bld 111 (*)    Albumin 3.2 (*)    Anion gap 16 (*)    All other components within normal limits  URINALYSIS, ROUTINE W REFLEX MICROSCOPIC  MAGNESIUM    Imaging Review No results found.   EKG Interpretation None      MDM   Final diagnoses:  Hypokalemia    68 year old female with diarrhea. Afebrile. Nonbloody stool. Abdominal exam is benign. Workup significant for hypokalemia. Magnesium is normal. Patient is on potassium supplementation. Is on demadex, but also Aldactone.  Additional K supplementation given in the emergency room. No diarrheal stool throughout her ED stay. . I feel safe for discharge at this time. Return precautions were discussed.    Raeford Razor, MD 01/26/14 1026

## 2014-02-28 ENCOUNTER — Ambulatory Visit: Payer: Worker's Compensation | Attending: Family Medicine | Admitting: Physical Therapy

## 2014-02-28 DIAGNOSIS — M629 Disorder of muscle, unspecified: Secondary | ICD-10-CM | POA: Diagnosis not present

## 2014-02-28 DIAGNOSIS — M2420 Disorder of ligament, unspecified site: Secondary | ICD-10-CM | POA: Insufficient documentation

## 2014-02-28 DIAGNOSIS — IMO0001 Reserved for inherently not codable concepts without codable children: Secondary | ICD-10-CM | POA: Insufficient documentation

## 2014-02-28 DIAGNOSIS — R262 Difficulty in walking, not elsewhere classified: Secondary | ICD-10-CM | POA: Diagnosis not present

## 2014-03-10 ENCOUNTER — Ambulatory Visit (INDEPENDENT_AMBULATORY_CARE_PROVIDER_SITE_OTHER): Payer: Worker's Compensation | Admitting: Podiatry

## 2014-03-10 ENCOUNTER — Encounter: Payer: Self-pay | Admitting: Podiatry

## 2014-03-10 DIAGNOSIS — B351 Tinea unguium: Secondary | ICD-10-CM

## 2014-03-10 DIAGNOSIS — M79606 Pain in leg, unspecified: Secondary | ICD-10-CM

## 2014-03-10 DIAGNOSIS — M79609 Pain in unspecified limb: Secondary | ICD-10-CM

## 2014-03-10 NOTE — Patient Instructions (Signed)
Seen for hypertrophic nails. All nails debrided. Return in 3 months or as needed.  

## 2014-03-10 NOTE — Progress Notes (Signed)
Subjective: 68 year old female presents in wheel chair accompanied by her care taker, requesting both feet checked and nails trimmed.   Objective: Left heel old wound has completely healed. No open lesions noted. All nails are hypertrophic.  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  Pedal pulses are palpable, and feet are cold and in purple color.   Assessment: Resolved old wound. Hypertrophic nails x 10.   Plan: All nails debrided.

## 2014-03-13 ENCOUNTER — Inpatient Hospital Stay (HOSPITAL_COMMUNITY)
Admission: EM | Admit: 2014-03-13 | Discharge: 2014-03-17 | DRG: 871 | Disposition: A | Discharge status: Home Health | Payer: Medicare Other | Attending: Internal Medicine | Admitting: Internal Medicine

## 2014-03-13 ENCOUNTER — Encounter (HOSPITAL_COMMUNITY): Payer: Self-pay | Admitting: Emergency Medicine

## 2014-03-13 ENCOUNTER — Emergency Department (HOSPITAL_COMMUNITY): Payer: Medicare Other

## 2014-03-13 DIAGNOSIS — L8993 Pressure ulcer of unspecified site, stage 3: Secondary | ICD-10-CM | POA: Diagnosis present

## 2014-03-13 DIAGNOSIS — K219 Gastro-esophageal reflux disease without esophagitis: Secondary | ICD-10-CM | POA: Diagnosis present

## 2014-03-13 DIAGNOSIS — G8220 Paraplegia, unspecified: Secondary | ICD-10-CM | POA: Diagnosis present

## 2014-03-13 DIAGNOSIS — A419 Sepsis, unspecified organism: Secondary | ICD-10-CM | POA: Diagnosis present

## 2014-03-13 DIAGNOSIS — R339 Retention of urine, unspecified: Secondary | ICD-10-CM | POA: Diagnosis present

## 2014-03-13 DIAGNOSIS — Z79899 Other long term (current) drug therapy: Secondary | ICD-10-CM

## 2014-03-13 DIAGNOSIS — R6521 Severe sepsis with septic shock: Secondary | ICD-10-CM

## 2014-03-13 DIAGNOSIS — R9431 Abnormal electrocardiogram [ECG] [EKG]: Secondary | ICD-10-CM

## 2014-03-13 DIAGNOSIS — L89602 Pressure ulcer of unspecified heel, stage 2: Secondary | ICD-10-CM

## 2014-03-13 DIAGNOSIS — I10 Essential (primary) hypertension: Secondary | ICD-10-CM | POA: Diagnosis present

## 2014-03-13 DIAGNOSIS — Z7401 Bed confinement status: Secondary | ICD-10-CM

## 2014-03-13 DIAGNOSIS — E785 Hyperlipidemia, unspecified: Secondary | ICD-10-CM | POA: Diagnosis present

## 2014-03-13 DIAGNOSIS — E876 Hypokalemia: Secondary | ICD-10-CM | POA: Diagnosis present

## 2014-03-13 DIAGNOSIS — N390 Urinary tract infection, site not specified: Secondary | ICD-10-CM | POA: Diagnosis present

## 2014-03-13 DIAGNOSIS — B961 Klebsiella pneumoniae [K. pneumoniae] as the cause of diseases classified elsewhere: Secondary | ICD-10-CM | POA: Diagnosis present

## 2014-03-13 DIAGNOSIS — Z6831 Body mass index (BMI) 31.0-31.9, adult: Secondary | ICD-10-CM

## 2014-03-13 DIAGNOSIS — E46 Unspecified protein-calorie malnutrition: Secondary | ICD-10-CM | POA: Diagnosis present

## 2014-03-13 DIAGNOSIS — R0902 Hypoxemia: Secondary | ICD-10-CM | POA: Diagnosis present

## 2014-03-13 DIAGNOSIS — G8929 Other chronic pain: Secondary | ICD-10-CM | POA: Diagnosis present

## 2014-03-13 DIAGNOSIS — L89609 Pressure ulcer of unspecified heel, unspecified stage: Secondary | ICD-10-CM | POA: Diagnosis present

## 2014-03-13 DIAGNOSIS — G9341 Metabolic encephalopathy: Secondary | ICD-10-CM | POA: Diagnosis present

## 2014-03-13 DIAGNOSIS — IMO0002 Reserved for concepts with insufficient information to code with codable children: Secondary | ICD-10-CM

## 2014-03-13 DIAGNOSIS — Z7982 Long term (current) use of aspirin: Secondary | ICD-10-CM | POA: Diagnosis not present

## 2014-03-13 DIAGNOSIS — A4159 Other Gram-negative sepsis: Secondary | ICD-10-CM | POA: Diagnosis present

## 2014-03-13 DIAGNOSIS — R6520 Severe sepsis without septic shock: Secondary | ICD-10-CM | POA: Diagnosis present

## 2014-03-13 DIAGNOSIS — Z87891 Personal history of nicotine dependence: Secondary | ICD-10-CM | POA: Diagnosis not present

## 2014-03-13 DIAGNOSIS — R7881 Bacteremia: Secondary | ICD-10-CM | POA: Diagnosis present

## 2014-03-13 DIAGNOSIS — A414 Sepsis due to anaerobes: Secondary | ICD-10-CM | POA: Diagnosis present

## 2014-03-13 DIAGNOSIS — R4182 Altered mental status, unspecified: Secondary | ICD-10-CM | POA: Diagnosis not present

## 2014-03-13 LAB — COMPREHENSIVE METABOLIC PANEL
ALBUMIN: 3.4 g/dL — AB (ref 3.5–5.2)
ALT: 44 U/L — ABNORMAL HIGH (ref 0–35)
ANION GAP: 21 — AB (ref 5–15)
AST: 45 U/L — ABNORMAL HIGH (ref 0–37)
Alkaline Phosphatase: 123 U/L — ABNORMAL HIGH (ref 39–117)
BILIRUBIN TOTAL: 0.5 mg/dL (ref 0.3–1.2)
BUN: 18 mg/dL (ref 6–23)
CHLORIDE: 94 meq/L — AB (ref 96–112)
CO2: 27 mEq/L (ref 19–32)
Calcium: 9.5 mg/dL (ref 8.4–10.5)
Creatinine, Ser: 0.84 mg/dL (ref 0.50–1.10)
GFR calc Af Amer: 80 mL/min — ABNORMAL LOW (ref >90)
GFR calc non Af Amer: 69 mL/min — ABNORMAL LOW (ref >90)
GLUCOSE: 163 mg/dL — AB (ref 70–99)
POTASSIUM: 3.5 meq/L — AB (ref 3.7–5.3)
Sodium: 142 mEq/L (ref 137–147)
Total Protein: 7.4 g/dL (ref 6.0–8.3)

## 2014-03-13 LAB — CBC
HEMATOCRIT: 43 % (ref 36.0–46.0)
HEMOGLOBIN: 14.3 g/dL (ref 12.0–15.0)
MCH: 27.6 pg (ref 26.0–34.0)
MCHC: 33.3 g/dL (ref 30.0–36.0)
MCV: 82.9 fL (ref 78.0–100.0)
Platelets: 214 10*3/uL (ref 150–400)
RBC: 5.19 MIL/uL — ABNORMAL HIGH (ref 3.87–5.11)
RDW: 13.2 % (ref 11.5–15.5)
WBC: 19.5 10*3/uL — AB (ref 4.0–10.5)

## 2014-03-13 LAB — URINE MICROSCOPIC-ADD ON

## 2014-03-13 LAB — I-STAT CHEM 8, ED
BUN: 18 mg/dL (ref 6–23)
CALCIUM ION: 0.94 mmol/L — AB (ref 1.13–1.30)
Chloride: 98 mEq/L (ref 96–112)
Creatinine, Ser: 0.9 mg/dL (ref 0.50–1.10)
GLUCOSE: 168 mg/dL — AB (ref 70–99)
HCT: 44 % (ref 36.0–46.0)
Hemoglobin: 15 g/dL (ref 12.0–15.0)
Potassium: 3.3 mEq/L — ABNORMAL LOW (ref 3.7–5.3)
Sodium: 138 mEq/L (ref 137–147)
TCO2: 29 mmol/L (ref 0–100)

## 2014-03-13 LAB — I-STAT CG4 LACTIC ACID, ED: Lactic Acid, Venous: 4.44 mmol/L — ABNORMAL HIGH (ref 0.5–2.2)

## 2014-03-13 LAB — URINALYSIS, ROUTINE W REFLEX MICROSCOPIC
Bilirubin Urine: NEGATIVE
GLUCOSE, UA: NEGATIVE mg/dL
Hgb urine dipstick: NEGATIVE
Ketones, ur: NEGATIVE mg/dL
Nitrite: NEGATIVE
Protein, ur: NEGATIVE mg/dL
Specific Gravity, Urine: 1.01 (ref 1.005–1.030)
Urobilinogen, UA: 0.2 mg/dL (ref 0.0–1.0)
pH: 6 (ref 5.0–8.0)

## 2014-03-13 LAB — CBG MONITORING, ED: Glucose-Capillary: 155 mg/dL — ABNORMAL HIGH (ref 70–99)

## 2014-03-13 MED ORDER — PIPERACILLIN-TAZOBACTAM 3.375 G IVPB 30 MIN
3.3750 g | Freq: Once | INTRAVENOUS | Status: AC
  Administered 2014-03-13: 3.375 g via INTRAVENOUS
  Filled 2014-03-13: qty 50

## 2014-03-13 MED ORDER — NALOXONE HCL 0.4 MG/ML IJ SOLN
0.4000 mg | Freq: Once | INTRAMUSCULAR | Status: AC
  Administered 2014-03-13: 0.4 mg via INTRAVENOUS
  Filled 2014-03-13: qty 1

## 2014-03-13 MED ORDER — ACETAMINOPHEN 325 MG PO TABS
650.0000 mg | ORAL_TABLET | Freq: Four times a day (QID) | ORAL | Status: DC | PRN
  Administered 2014-03-14 – 2014-03-16 (×6): 650 mg via ORAL
  Filled 2014-03-13 (×6): qty 2

## 2014-03-13 MED ORDER — ACETAMINOPHEN 650 MG RE SUPP
650.0000 mg | Freq: Once | RECTAL | Status: AC
  Administered 2014-03-13: 650 mg via RECTAL
  Filled 2014-03-13: qty 1

## 2014-03-13 MED ORDER — SODIUM CHLORIDE 0.9 % IV BOLUS (SEPSIS)
1000.0000 mL | Freq: Once | INTRAVENOUS | Status: AC
  Administered 2014-03-13: 1000 mL via INTRAVENOUS

## 2014-03-13 MED ORDER — VANCOMYCIN HCL IN DEXTROSE 1-5 GM/200ML-% IV SOLN
1000.0000 mg | Freq: Once | INTRAVENOUS | Status: AC
  Administered 2014-03-13: 1000 mg via INTRAVENOUS
  Filled 2014-03-13: qty 200

## 2014-03-13 NOTE — ED Provider Notes (Signed)
CSN: [id number]     Arrival date & time 03/13/14  2114 History   First MD Initiated Contact with Patient 03/13/14 2129     Chief Complaint  Patient presents with  . Altered Mental Status  . Fever   Level V caveat due to altered status.  (Consider location/radiation/quality/duration/timing/severity/associated sxs/prior Treatment) Patient is a 68 y.o. female presenting with altered mental status and fever. The history is provided by the patient.  Altered Mental Status Associated symptoms: fever   Fever  patient is in with altered mental status and decreased responsiveness. Has a fever. Have given her medications tonight by family members. Family member states she's been complaining of feeling bad. No headaches. She had been complaining of some pain in her legs. She has a history of urinary tract infections and has had episodes like this was infections in the past. 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  Past Medical History  Diagnosis Date  . GERD (gastroesophageal reflux disease)   . Quadriplegia   . Hypertension   . Pressure ulcer of foot   . Chronic pain disorder   . Hyperlipidemia    Past Surgical History  Procedure Laterality Date  . Back surgery  08/2013  . Abdominal hysterectomy     Family History  Problem Relation Age of Onset  . Adopted: Yes   History  Substance Use Topics  . Smoking status: Former Smoker -- 0.25 packs/day    Types: Cigarettes  . Smokeless tobacco: Never Used  . Alcohol Use: No   OB History   Grav Para Term Preterm Abortions TAB SAB Ect Mult Living                 Review of Systems  Unable to perform ROS Constitutional: Positive for fever.      Allergies  Decadrol; Decadron; and Sodium phosphate  Home Medications   Prior to Admission medications   Medication Sig Start Date End Date Taking? Authorizing Provider  aspirin EC 81 MG tablet Take 81 mg by mouth every morning.    Yes Historical Provider, MD  baclofen (LIORESAL) 10 MG  tablet Take 10 mg by mouth 3 (three) times daily.   Yes Historical Provider, MD  Choline Fenofibrate (TRILIPIX) 135 MG capsule Take 135 mg by mouth every morning.    Yes Historical Provider, MD  ezetimibe (ZETIA) 10 MG tablet Take 10 mg by mouth every morning.    Yes Historical Provider, MD  gabapentin (NEURONTIN) 300 MG capsule Take 300 mg by mouth 3 (three) times daily.   Yes Historical Provider, MD  LORazepam (ATIVAN) 1 MG tablet Take 1 mg by mouth 4 (four) times daily.    Yes Historical Provider, MD  magnesium oxide (MAG-OX) 400 MG tablet Take 400 mg by mouth 2 (two) times daily.   Yes Historical Provider, MD  metolazone (ZAROXOLYN) 2.5 MG tablet Take 2.5 mg by mouth every morning.    Yes Historical Provider, MD  Multiple Vitamin (MULTIVITAMIN WITH MINERALS) TABS tablet Take 1 tablet by mouth every morning.    Yes Historical Provider, MD  omeprazole (PRILOSEC) 20 MG capsule Take 20 mg by mouth every morning.    Yes Historical Provider, MD  OxyCODONE (OXYCONTIN) 20 mg T12A 12 hr tablet Take 40 mg by mouth 2 (two) times daily.    Yes Historical Provider, MD  polyethylene glycol (MIRALAX / GLYCOLAX) packet Take 17 g by mouth daily as needed for mild constipation.   Yes Historical Provider, MD  potassium chloride SA (K-DUR,KLOR-CON)  20 MEQ tablet Take 20 mEq by mouth 3 (three) times daily.   Yes Historical Provider, MD  promethazine (PHENERGAN) 25 MG tablet Take 25 mg by mouth every 6 (six) hours as needed for nausea or vomiting.   Yes Historical Provider, MD  sodium phosphate (FLEET) enema Place 1 enema rectally daily as needed (constipation). follow package directions   Yes Historical Provider, MD  spironolactone (ALDACTONE) 25 MG tablet Take 25 mg by mouth 2 (two) times daily.   Yes Historical Provider, MD  tamsulosin (FLOMAX) 0.4 MG CAPS capsule Take 0.4 mg by mouth every morning.    Yes Historical Provider, MD  torsemide (DEMADEX) 20 MG tablet Take 20 mg by mouth every morning.    Yes Historical  Provider, MD  traZODone (DESYREL) 50 MG tablet Take 50 mg by mouth at bedtime.   Yes Historical Provider, MD  trifluoperazine (STELAZINE) 5 MG tablet Take 5 mg by mouth every morning.    Yes Historical Provider, MD  Vortioxetine HBr (BRINTELLIX) 10 MG TABS Take 10 mg by mouth 3 (three) times daily.    Yes Historical Provider, MD  zolpidem (AMBIEN) 5 MG tablet Take 5 mg by mouth at bedtime as needed for sleep.   Yes Historical Provider, MD   BP 109/70  Pulse 73  Temp(Src) 100.9 F (38.3 C) (Oral)  Resp 16  Ht  (1.549 m)  Wt 165 lb 2 oz (74.9 kg)  BMI 31.22 kg/m2  SpO2 100% Physical Exam  Constitutional: She appears well-developed.  HENT:  Dry mucous membranes  Eyes:  Pupils are constricted but reactive.  Neck: Neck supple.  Cardiovascular: Normal rate and regular rhythm.   Pulmonary/Chest:  Transmitted upper air way sounds.  Abdominal: Soft. There is no tenderness.  Musculoskeletal: She exhibits no edema.  Neurological:  Decreased responsiveness. More arousable after Narcan. We'll ask for her son. Chronic quadriplegic.  Skin: Skin is warm.  Shallow decubitus ulcers in sacral area. No heel decubitus ulcers.     ED Course  Procedures (including critical care time) Labs Review Labs Reviewed  MRSA PCR SCREENING - Abnormal; Notable for the following:    MRSA by PCR POSITIVE (*)    All other components within normal limits  URINALYSIS, ROUTINE W REFLEX MICROSCOPIC - Abnormal; Notable for the following:    APPearance CLOUDY (*)    Leukocytes, UA LARGE (*)    All other components within normal limits  CBC - Abnormal; Notable for the following:    WBC 19.5 (*)    RBC 5.19 (*)    All other components within normal limits  COMPREHENSIVE METABOLIC PANEL - Abnormal; Notable for the following:    Potassium 3.5 (*)    Chloride 94 (*)    Glucose, Bld 163 (*)    Albumin 3.4 (*)    AST 45 (*)    ALT 44 (*)    Alkaline Phosphatase 123 (*)    GFR calc non Af Amer 69 (*)     GFR calc Af Amer 80 (*)    Anion gap 21 (*)    All other components within normal limits  URINE MICROSCOPIC-ADD ON - Abnormal; Notable for the following:    Bacteria, UA FEW (*)    All other components within normal limits  BLOOD GAS, ARTERIAL - Abnormal; Notable for the following:    pH, Arterial 7.489 (*)    Bicarbonate 29.4 (*)    Acid-Base Excess 6.1 (*)    All other components within normal limits  COMPREHENSIVE METABOLIC PANEL - Abnormal; Notable for the following:    Potassium 3.3 (*)    Glucose, Bld 185 (*)    Albumin 3.0 (*)    ALT 37 (*)    GFR calc non Af Amer 68 (*)    GFR calc Af Amer 79 (*)    All other components within normal limits  CBC - Abnormal; Notable for the following:    WBC 18.8 (*)    All other components within normal limits  I-STAT CG4 LACTIC ACID, ED - Abnormal; Notable for the following:    Lactic Acid, Venous 4.44 (*)    All other components within normal limits  CBG MONITORING, ED - Abnormal; Notable for the following:    Glucose-Capillary 155 (*)    All other components within normal limits  I-STAT CHEM 8, ED - Abnormal; Notable for the following:    Potassium 3.3 (*)    Glucose, Bld 168 (*)    Calcium, Ion 0.94 (*)    All other components within normal limits  CULTURE, BLOOD (ROUTINE X 2)  CULTURE, BLOOD (ROUTINE X 2)  TROPONIN I  MAGNESIUM  PHOSPHORUS  TSH  PROCALCITONIN  TROPONIN I  TROPONIN I  CBC  BASIC METABOLIC PANEL    Imaging Review Ct Head Wo Contrast  03/14/2014   CLINICAL DATA:  Altered mental status, fever  EXAM: CT HEAD WITHOUT CONTRAST  TECHNIQUE: Contiguous axial images were obtained from the base of the skull through the vertex without intravenous contrast.  COMPARISON:  03/13/2007  FINDINGS: No evidence of parenchymal hemorrhage or extra-axial fluid collection. No mass lesion, mass effect, or midline shift.  No CT evidence of acute infarction.  Mild subcortical white matter and periventricular small vessel ischemic  changes. Intracranial atherosclerosis.  Cerebral volume is within normal limits.  No ventriculomegaly.  The visualized paranasal sinuses are essentially clear. The mastoid air cells are unopacified.  No evidence of calvarial fracture.  IMPRESSION: No evidence of acute intracranial abnormality.  Mild small vessel ischemic changes.   Electronically Signed   By: Charline Bills M.D.   On: 03/14/2014 01:23   Dg Chest Port 1 View  (if Code Sepsis Called)  03/13/2014   CLINICAL DATA:  Fever, altered mental status  EXAM: PORTABLE CHEST - 1 VIEW  COMPARISON:  03/16/2007  FINDINGS: Lungs are clear.  No pleural effusion or pneumothorax.  Mild cardiomegaly.  IMPRESSION: No evidence of acute cardiopulmonary disease.   Electronically Signed   By: Charline Bills M.D.   On: 03/13/2014 21:44     EKG Interpretation   Date/Time:  Monday March 13 2014 21:20:23 EDT Ventricular Rate:  107 PR Interval:  163 QRS Duration: 71 QT Interval:  369 QTC Calculation: 492 R Axis:   22 Text Interpretation:  Sinus tachycardia Minimal ST depression, inferior  leads Borderline prolonged QT interval Confirmed by Rubin Payor  MD, Harrold Donath  [phone]) on 03/13/2014 11:42:34 PM      MDM   Final diagnoses:  Sepsis, due to unspecified organism  Urinary tract infection, acute    Patient with fever. Decreased mental status. She has had previous episodes with similar symptoms of urinary tract infection. Some improvement with Narcan. Urinalysis shows possible urinary tract infection. Vitals are reassuring. Will admit to internal medicine. Doubt severe intracranial infection.    Juliet Rude. Rubin Payor, MD 03/14/14 2325

## 2014-03-13 NOTE — ED Notes (Signed)
Brought in by EMS from home with c/o lethargy and fever.  Per EMS, family reported that pt was observed to be unresponsive lying in bed.  Family reported that pt has not been feeling well  All day; pt was given his usual medications at bedtime including her oxycodone.  Pt's pupils were constricted on EMS' arrival--- pt was given Narcan 1 mg intranasally; pt became responsive and verbally responsive but pt is still "weak and lethargic".  Pt's temp T103.9 rectally on arrival to ED.

## 2014-03-13 NOTE — ED Notes (Signed)
Bed: WA24 Expected date:  Expected time:  Means of arrival:  Comments: EMS 

## 2014-03-14 ENCOUNTER — Ambulatory Visit: Payer: Medicare Other | Admitting: Physical Therapy

## 2014-03-14 ENCOUNTER — Encounter (HOSPITAL_COMMUNITY): Payer: Self-pay | Admitting: Radiology

## 2014-03-14 DIAGNOSIS — A419 Sepsis, unspecified organism: Secondary | ICD-10-CM | POA: Diagnosis present

## 2014-03-14 DIAGNOSIS — IMO0002 Reserved for concepts with insufficient information to code with codable children: Secondary | ICD-10-CM

## 2014-03-14 DIAGNOSIS — L89609 Pressure ulcer of unspecified heel, unspecified stage: Secondary | ICD-10-CM

## 2014-03-14 DIAGNOSIS — G8220 Paraplegia, unspecified: Secondary | ICD-10-CM

## 2014-03-14 DIAGNOSIS — L8992 Pressure ulcer of unspecified site, stage 2: Secondary | ICD-10-CM

## 2014-03-14 DIAGNOSIS — N390 Urinary tract infection, site not specified: Secondary | ICD-10-CM

## 2014-03-14 DIAGNOSIS — G9341 Metabolic encephalopathy: Secondary | ICD-10-CM

## 2014-03-14 DIAGNOSIS — R9431 Abnormal electrocardiogram [ECG] [EKG]: Secondary | ICD-10-CM | POA: Insufficient documentation

## 2014-03-14 LAB — BLOOD GAS, ARTERIAL
Acid-Base Excess: 6.1 mmol/L — ABNORMAL HIGH (ref 0.0–2.0)
Bicarbonate: 29.4 mEq/L — ABNORMAL HIGH (ref 20.0–24.0)
Drawn by: 232811
O2 CONTENT: 2 L/min
O2 Saturation: 97.7 %
PATIENT TEMPERATURE: 99.6
PH ART: 7.489 — AB (ref 7.350–7.450)
TCO2: 25.8 mmol/L (ref 0–100)
pCO2 arterial: 39.3 mmHg (ref 35.0–45.0)
pO2, Arterial: 92.2 mmHg (ref 80.0–100.0)

## 2014-03-14 LAB — TROPONIN I
Troponin I: <0.3 ng/mL (ref <0.30)
Troponin I: <0.3 ng/mL (ref <0.30)

## 2014-03-14 LAB — COMPREHENSIVE METABOLIC PANEL
ALBUMIN: 3 g/dL — AB (ref 3.5–5.2)
ALT: 37 U/L — AB (ref 0–35)
AST: 36 U/L (ref 0–37)
Alkaline Phosphatase: 110 U/L (ref 39–117)
Anion gap: 15 (ref 5–15)
BUN: 20 mg/dL (ref 6–23)
CO2: 28 mEq/L (ref 19–32)
Calcium: 8.6 mg/dL (ref 8.4–10.5)
Chloride: 100 mEq/L (ref 96–112)
Creatinine, Ser: 0.85 mg/dL (ref 0.50–1.10)
GFR calc Af Amer: 79 mL/min — ABNORMAL LOW (ref >90)
GFR calc non Af Amer: 68 mL/min — ABNORMAL LOW (ref >90)
Glucose, Bld: 185 mg/dL — ABNORMAL HIGH (ref 70–99)
Potassium: 3.3 mEq/L — ABNORMAL LOW (ref 3.7–5.3)
SODIUM: 143 meq/L (ref 137–147)
TOTAL PROTEIN: 6.6 g/dL (ref 6.0–8.3)
Total Bilirubin: 0.6 mg/dL (ref 0.3–1.2)

## 2014-03-14 LAB — CBC
HCT: 38.4 % (ref 36.0–46.0)
HEMOGLOBIN: 12.6 g/dL (ref 12.0–15.0)
MCH: 27.5 pg (ref 26.0–34.0)
MCHC: 32.8 g/dL (ref 30.0–36.0)
MCV: 83.8 fL (ref 78.0–100.0)
Platelets: 197 10*3/uL (ref 150–400)
RBC: 4.58 MIL/uL (ref 3.87–5.11)
RDW: 13.4 % (ref 11.5–15.5)
WBC: 18.8 10*3/uL — ABNORMAL HIGH (ref 4.0–10.5)

## 2014-03-14 LAB — PHOSPHORUS: PHOSPHORUS: 3.4 mg/dL (ref 2.3–4.6)

## 2014-03-14 LAB — TSH: TSH: 3.45 u[IU]/mL (ref 0.350–4.500)

## 2014-03-14 LAB — MRSA PCR SCREENING: MRSA BY PCR: POSITIVE — AB

## 2014-03-14 LAB — MAGNESIUM: MAGNESIUM: 1.7 mg/dL (ref 1.5–2.5)

## 2014-03-14 LAB — PROCALCITONIN: Procalcitonin: 3.82 ng/mL

## 2014-03-14 MED ORDER — SODIUM CHLORIDE 0.9 % IV SOLN
INTRAVENOUS | Status: AC
  Administered 2014-03-14: 75 mL/h via INTRAVENOUS

## 2014-03-14 MED ORDER — PIPERACILLIN-TAZOBACTAM 3.375 G IVPB
3.3750 g | Freq: Three times a day (TID) | INTRAVENOUS | Status: DC
  Administered 2014-03-14 – 2014-03-16 (×7): 3.375 g via INTRAVENOUS
  Filled 2014-03-14 (×8): qty 50

## 2014-03-14 MED ORDER — SODIUM CHLORIDE 0.9 % IJ SOLN: 3.0000 mL | Freq: Two times a day (BID) | INTRAMUSCULAR | Status: DC

## 2014-03-14 MED ORDER — ENOXAPARIN SODIUM 40 MG/0.4ML ~~LOC~~ SOLN
40.0000 mg | SUBCUTANEOUS | Status: DC
  Administered 2014-03-14 – 2014-03-15 (×2): 40 mg via SUBCUTANEOUS
  Filled 2014-03-14 (×2): qty 0.4

## 2014-03-14 MED ORDER — GABAPENTIN 300 MG PO CAPS
300.0000 mg | ORAL_CAPSULE | Freq: Three times a day (TID) | ORAL | Status: DC
  Administered 2014-03-14 – 2014-03-17 (×9): 300 mg via ORAL
  Filled 2014-03-14 (×12): qty 1

## 2014-03-14 MED ORDER — LORAZEPAM 1 MG PO TABS
1.0000 mg | ORAL_TABLET | Freq: Four times a day (QID) | ORAL | Status: DC | PRN
  Administered 2014-03-14: 1 mg via ORAL
  Filled 2014-03-14: qty 1

## 2014-03-14 MED ORDER — SODIUM CHLORIDE 0.9 % IV BOLUS (SEPSIS): 1000.0000 mL | Freq: Once | INTRAVENOUS | Status: DC

## 2014-03-14 MED ORDER — EZETIMIBE 10 MG PO TABS
10.0000 mg | ORAL_TABLET | Freq: Every morning | ORAL | Status: DC
  Administered 2014-03-15 – 2014-03-17 (×3): 10 mg via ORAL
  Filled 2014-03-14 (×4): qty 1

## 2014-03-14 MED ORDER — VANCOMYCIN HCL IN DEXTROSE 750-5 MG/150ML-% IV SOLN
750.0000 mg | Freq: Two times a day (BID) | INTRAVENOUS | Status: DC
  Administered 2014-03-14 – 2014-03-15 (×3): 750 mg via INTRAVENOUS
  Filled 2014-03-14 (×4): qty 150

## 2014-03-14 MED ORDER — SODIUM CHLORIDE 0.9 % IV SOLN
Freq: Once | INTRAVENOUS | Status: AC
  Administered 2014-03-14: 999 mL/h via INTRAVENOUS

## 2014-03-14 MED ORDER — ONDANSETRON HCL 4 MG/2ML IJ SOLN
4.0000 mg | Freq: Four times a day (QID) | INTRAMUSCULAR | Status: DC | PRN
  Administered 2014-03-14 – 2014-03-17 (×2): 4 mg via INTRAVENOUS
  Filled 2014-03-14 (×2): qty 2

## 2014-03-14 MED ORDER — LORAZEPAM 1 MG PO TABS: 1.0000 mg | ORAL_TABLET | Freq: Two times a day (BID) | ORAL | Status: DC | PRN

## 2014-03-14 MED ORDER — PANTOPRAZOLE SODIUM 40 MG IV SOLR
40.0000 mg | Freq: Every day | INTRAVENOUS | Status: DC
  Administered 2014-03-14: 40 mg via INTRAVENOUS
  Filled 2014-03-14 (×2): qty 40

## 2014-03-14 MED ORDER — ONDANSETRON HCL 4 MG PO TABS: 4.0000 mg | ORAL_TABLET | Freq: Four times a day (QID) | ORAL | Status: DC | PRN

## 2014-03-14 MED ORDER — VORTIOXETINE HBR 10 MG PO TABS
10.0000 mg | ORAL_TABLET | Freq: Three times a day (TID) | ORAL | Status: DC
  Administered 2014-03-14 – 2014-03-17 (×9): 10 mg via ORAL
  Filled 2014-03-14 (×16): qty 1

## 2014-03-14 MED ORDER — SODIUM CHLORIDE 0.9 % IV SOLN
INTRAVENOUS | Status: DC
  Administered 2014-03-14 – 2014-03-17 (×4): via INTRAVENOUS

## 2014-03-14 MED ORDER — POTASSIUM CHLORIDE 10 MEQ/100ML IV SOLN
10.0000 meq | INTRAVENOUS | Status: AC
  Administered 2014-03-14 (×4): 10 meq via INTRAVENOUS
  Filled 2014-03-14: qty 100

## 2014-03-14 MED ORDER — ASPIRIN EC 81 MG PO TBEC
81.0000 mg | DELAYED_RELEASE_TABLET | Freq: Every morning | ORAL | Status: DC
Start: 2014-03-14 — End: 2014-03-17
  Administered 2014-03-15 – 2014-03-17 (×3): 81 mg via ORAL
  Filled 2014-03-14 (×3): qty 1

## 2014-03-14 MED ORDER — TRAZODONE HCL 50 MG PO TABS
50.0000 mg | ORAL_TABLET | Freq: Every day | ORAL | Status: DC
  Administered 2014-03-14 – 2014-03-16 (×3): 50 mg via ORAL
  Filled 2014-03-14 (×4): qty 1

## 2014-03-14 MED ORDER — NALOXONE HCL 0.4 MG/ML IJ SOLN
0.4000 mg | Freq: Once | INTRAMUSCULAR | Status: AC
  Administered 2014-03-14: 0.4 mg via INTRAVENOUS
  Filled 2014-03-14: qty 1

## 2014-03-14 NOTE — ED Notes (Signed)
Pt's contact--- [name] [name]:  Tel# [phone]

## 2014-03-14 NOTE — Progress Notes (Addendum)
CRITICAL VALUE ALERT  Critical value received: Anaerobic Gram negative rods   Date of notification:  03/14/2014  Time of notification:  1155  Critical value read back:Yes.    Nurse who received alert:  Nurse Derrick Tiegs  MD notified (1st page):  Elisabeth Pigeon, A  Time of first page:  1200  MD notified (2nd page):  Time of second page:  Responding MD:  Ashok Pall  Time MD responded:  1215

## 2014-03-14 NOTE — Progress Notes (Signed)
  Echocardiogram 2D Echocardiogram has been performed.  Leta Jungling M 03/14/2014, 9:38 AM

## 2014-03-14 NOTE — Progress Notes (Signed)
ANTIBIOTIC CONSULT NOTE - INITIAL  Pharmacy Consult for Vancomycin Indication: Sepsis  Allergies  Allergen Reactions  . Decadrol [Dexamethasone] Swelling  . Decadron [Dexamethasone] Swelling  . Sodium Phosphate Swelling    Patient Measurements: Height:  (154.9 cm) Weight: 162 lb 7.7 oz (73.7 kg) IBW/kg (Calculated) : 47.8   Vital Signs: Temp: 99.7 F (37.6 C) (09/08 0200) Temp src: Oral (09/08 0200) BP: 133/60 mmHg (09/08 0315) Pulse Rate: 72 (09/08 0315) Intake/Output from previous day: 09/07 0701 - 09/08 0700 In: 1000 [I.V.:1000] Out: -  Intake/Output from this shift: Total I/O In: 1000 [I.V.:1000] Out: -   Labs:  Recent Labs  03/13/14 2140 03/13/14 2149  WBC 19.5*  --   HGB 14.3 15.0  PLT 214  --   CREATININE 0.84 0.90   Estimated Creatinine Clearance: 54.2 ml/min (by C-G formula based on Cr of 0.9). No results found for this basename: VANCOTROUGH, VANCOPEAK, VANCORANDOM, GENTTROUGH, GENTPEAK, GENTRANDOM, TOBRATROUGH, TOBRAPEAK, TOBRARND, AMIKACINPEAK, AMIKACINTROU, AMIKACIN,  in the last 72 hours   Microbiology: No results found for this or any previous visit (from the past 720 hour(s)).  Medical History: Past Medical History  Diagnosis Date  . GERD (gastroesophageal reflux disease)   . Quadriplegia   . Hypertension   . Pressure ulcer of foot   . Chronic pain disorder   . Hyperlipidemia     Medications:  Scheduled:  . aspirin EC  81 mg Oral q morning - 10a  . enoxaparin (LOVENOX) injection  40 mg Subcutaneous Q24H  . ezetimibe  10 mg Oral q morning - 10a  . gabapentin  300 mg Oral TID  . pantoprazole (PROTONIX) IV  40 mg Intravenous QHS  . piperacillin-tazobactam (ZOSYN)  IV  3.375 g Intravenous 3 times per day  . potassium chloride  10 mEq Intravenous Q1 Hr x 4  . sodium chloride  1,000 mL Intravenous Once  . sodium chloride  3 mL Intravenous Q12H  . traZODone  50 mg Oral QHS  . vancomycin  750 mg Intravenous Q12H  . Vortioxetine HBr   10 mg Oral TID   Infusions:  . sodium chloride 75 mL/hr (03/14/14 0230)  . sodium chloride     Assessment: 50 yoF with hx of GERD, quadriplegia, HTN, pressure ulcer of foot, chronic pain disorder and hyperlipidemia admitted with altered mental status.  Vancomycin per Rx and Zosyn per MD for Sepsis probable source UTI.   Goal of Therapy:  Vancomycin trough level 15-20 mcg/ml  Plan:   Vancomycin 1GM x1 in ER then  IV q12h  F/U SCr/levels/cultures as needed  Lorenza Evangelist 03/14/2014,3:21 AM

## 2014-03-14 NOTE — Progress Notes (Signed)
Utilization review completed.  

## 2014-03-14 NOTE — Progress Notes (Signed)
Patient ID: [name] [name], female   DOB: 27-Jun-1946, 68 y.o.   MRN: [id number] [facility] PROGRESS NOTE  [name] [name] QBH:[id number] DOB: 03-Jul-1946 DOA: 03/13/2014 PCP: Paris Lore, MD  Brief narrative: Addendum to admission not done today 03/14/2014 68 y.o. female with past medical history of paraplegia, hypertension who presented to [facility] ED 03/13/2014 with worsening mental status changes over past few days prior to this admission. Pt is still somnolent and not able to provide details of medical history.  On admission pt was found to have BP of 85/32, HR 72-108, RR 11-25, T max 103.9 F. UA showed large leukocytes on admission. CXR showed no acute cardiopulmonary findings. She was started on broad spectrum abx for treatment of sepsis.      Assessment/Plan:    Principal Problem:   Severe sepsis secondary to UTI and gram negative bacteremia /Leukocytosis   Sepsis criteria met with initial vital signs: BP 85/32, HR 72-108, RR 11-25, T max 103.9 F. WBC count elevated at 19.5.There is an evidence of UTI based on urinalysis. Pt also has blood cultures which are growing gram negative rods. In addition, evidence of end organ failure includes elevated lactic acid, hypoxia of 88%.   Pt started on vanco and zosyn.  Follow up final blood culture results. She will need repeat blood culture on 03/15/2014 to ensure clearing of bacteremia.  Follow up urine culture results.  Pt is hemodynamically stable and stable at this time to be transferred out of SDU to telemetry unit.  Continue to monitor vitals per telemetry protocol.  Continue IV fluids. Active Problems:   Decubitus ulcer, heel  Wound care consulted; appreciate their input    Acute metabolic encephalopathy  Secondary to acute infection, UTI and bacteremia   No significant changes in mental status since admission    Hypokalemia  Being repleted    Paraplegia following spinal cord injury  Stable    DVT Prophylaxis   Lovenox subQ  while pt is in hospital   Code Status: Full.  Family Communication:  plan of care discussed with the patient Disposition Plan: Home when stable.    IV Access:   Peripheral IV Procedures and diagnostic studies:    Ct Head Wo Contrast 03/14/2014   No evidence of acute intracranial abnormality.  Mild small vessel ischemic changes.     Dg Chest Port 1 View  (if Code Sepsis Called) 03/13/2014   No evidence of acute cardiopulmonary disease.     Medical Consultants:   None  Other Consultants:   None  Anti-Infectives:   Zosyn 03/13/2014 --. Vanco 03/13/2014 -->   [name] Lenz, MD  [facility] Pager [phone]  If 7PM-7AM, please contact night-coverage [email or web address] Password [email or web address] 03/14/2014, 8:10 AM   LOS: 1 day    HPI/Subjective: No acute overnight events.  Objective: Filed Vitals:   03/14/14 0500 03/14/14 0530 03/14/14 0600 03/14/14 0630  BP: 86/27 163/63 125/44 141/61  Pulse: 76 83 76 77  Temp:      TempSrc:      Resp: _0 Height:      Weight:      SpO2: 100% 100% 100% 100%    Intake/Output Summary (Last 24 hours) at 03/14/14 0810 Last data filed at 03/14/14 0715  Gross per 24 hour  Intake   2950 ml  Output      0 ml  Net   2950 ml    Exam:   General:  Pt is sleeping, briefly wakes up with sternal  rub but not to verbal stimulation  Cardiovascular: Regular rate and rhythm, S1/S2 appreciated   Respiratory: diminished breath sounds bilaterally, poor inspiratory effort   Abdomen: Soft, non tender, non distended, bowel sounds present  Extremities: Pulses DP and PT palpable bilaterally; has heel protection on bilateral feet  Neuro: Grossly nonfocal  Data Reviewed: Basic Metabolic Panel:  Recent Labs Lab 03/13/14 2140 03/13/14 2149 03/14/14 0630  NA 142 138 143  K 3.5* 3.3* 3.3*  CL 94* 98 100  CO2 27  --  28  GLUCOSE 163* 168* 185*  BUN _0 CREATININE 0.84 0.90 0.85  CALCIUM 9.5  --  8.6  MG  --   --  1.7  PHOS  --   --  3.4    Liver Function Tests:  Recent Labs Lab 03/13/14 2140 03/14/14 0630  AST 45* 36  ALT 44* 37*  ALKPHOS 123* 110  BILITOT 0.5 0.6  PROT 7.4 6.6  ALBUMIN 3.4* 3.0*   No results found for this basename: LIPASE, AMYLASE,  in the last 168 hours No results found for this basename: AMMONIA,  in the last 168 hours CBC:  Recent Labs Lab 03/13/14 2140 03/13/14 2149 03/14/14 0630  WBC 19.5*  --  18.8*  HGB 14.3 15.0 12.6  HCT 43.0 44.0 38.4  MCV 82.9  --  83.8  PLT 214  --  197   Cardiac Enzymes:  Recent Labs Lab 03/14/14 0027 03/14/14 0630  TROPONINI <0.30 <0.30   BNP: No components found with this basename: POCBNP,  CBG:  Recent Labs Lab 03/13/14 2232  GLUCAP 155*    MRSA PCR SCREENING     Status: Abnormal   Collection Time    03/14/14  3:17 AM      Result Value Ref Range Status   MRSA by PCR POSITIVE (*) NEGATIVE Final     Scheduled Meds: . aspirin EC  81 mg Oral q morning - 10a  . enoxaparin (LOVENOX)   40 mg Subcutaneous Q24H  . ezetimibe  10 mg Oral q morning - 10a  . gabapentin  300 mg Oral TID  . pantoprazole   40 mg Intravenous QHS  . piperacillin-tazobactam  3.375 g Intravenous 3 times per day  . potassium chloride  10 mEq Intravenous Q1 Hr x 4  . traZODone  50 mg Oral QHS  . vancomycin  750 mg Intravenous Q12H  . Vortioxetine HBr  10 mg Oral TID   Continuous Infusions: . sodium chloride 75 mL/hr (03/14/14 0230)  . sodium chloride
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
## 2014-03-14 NOTE — Consult Note (Signed)
WOC wound consult note Reason for Consult:Left heel with Stage III healing pressure Ulcer Wound type:Pressure Pressure Ulcer POA: Yes Measurement:0.6cm x 2cm x 0.2cm Wound ZOX:WRUEAV, dry, pink Drainage (amount, consistency, odor) scant amount serous exudate Periwound:intact with evidence of previous wound healing Dressing procedure/placement/frequency:I will direct nursing to place bilateral heel silicone foam dressings and to place patient's feet in pressure redistribution boots per our house protocol. WOC nursing team will not follow, but will remain available to this patient, the nursing and medical team.  Please re-consult if needed. Thanks, Ladona Mow, MSN, RN, GNP, [place], CWON-AP [phone])

## 2014-03-15 DIAGNOSIS — R339 Retention of urine, unspecified: Secondary | ICD-10-CM | POA: Diagnosis not present

## 2014-03-15 DIAGNOSIS — E876 Hypokalemia: Secondary | ICD-10-CM

## 2014-03-15 DIAGNOSIS — A419 Sepsis, unspecified organism: Secondary | ICD-10-CM | POA: Diagnosis present

## 2014-03-15 DIAGNOSIS — R6521 Severe sepsis with septic shock: Secondary | ICD-10-CM

## 2014-03-15 LAB — BASIC METABOLIC PANEL
ANION GAP: 13 (ref 5–15)
BUN: 14 mg/dL (ref 6–23)
CO2: 27 meq/L (ref 19–32)
CREATININE: 0.7 mg/dL (ref 0.50–1.10)
Calcium: 8.5 mg/dL (ref 8.4–10.5)
Chloride: 98 mEq/L (ref 96–112)
GFR calc Af Amer: >90 mL/min (ref >90)
GFR calc non Af Amer: 86 mL/min — ABNORMAL LOW (ref >90)
Glucose, Bld: 111 mg/dL — ABNORMAL HIGH (ref 70–99)
POTASSIUM: 2.8 meq/L — AB (ref 3.7–5.3)
Sodium: 138 mEq/L (ref 137–147)

## 2014-03-15 LAB — CBC
HEMATOCRIT: 33.4 % — AB (ref 36.0–46.0)
Hemoglobin: 10.9 g/dL — ABNORMAL LOW (ref 12.0–15.0)
MCH: 27.3 pg (ref 26.0–34.0)
MCHC: 32.6 g/dL (ref 30.0–36.0)
MCV: 83.7 fL (ref 78.0–100.0)
Platelets: 173 10*3/uL (ref 150–400)
RBC: 3.99 MIL/uL (ref 3.87–5.11)
RDW: 13.5 % (ref 11.5–15.5)
WBC: 10.2 10*3/uL (ref 4.0–10.5)

## 2014-03-15 MED ORDER — PANTOPRAZOLE SODIUM 40 MG PO TBEC
40.0000 mg | DELAYED_RELEASE_TABLET | Freq: Every day | ORAL | Status: DC
  Administered 2014-03-15 – 2014-03-16 (×2): 40 mg via ORAL
  Filled 2014-03-15 (×3): qty 1

## 2014-03-15 MED ORDER — ENSURE PUDDING PO PUDG
1.0000 | Freq: Two times a day (BID) | ORAL | Status: DC
  Filled 2014-03-15 (×4): qty 1

## 2014-03-15 MED ORDER — POTASSIUM CHLORIDE CRYS ER 20 MEQ PO TBCR
40.0000 meq | EXTENDED_RELEASE_TABLET | ORAL | Status: AC
  Administered 2014-03-15 (×2): 40 meq via ORAL
  Filled 2014-03-15 (×2): qty 2

## 2014-03-15 MED ORDER — ADULT MULTIVITAMIN LIQUID CH
5.0000 mL | Freq: Every day | ORAL | Status: DC
  Administered 2014-03-16 – 2014-03-17 (×2): 5 mL via ORAL
  Filled 2014-03-15 (×3): qty 5

## 2014-03-15 MED ORDER — MAGNESIUM SULFATE 40 MG/ML IJ SOLN
2.0000 g | Freq: Once | INTRAMUSCULAR | Status: AC
  Administered 2014-03-15: 2 g via INTRAVENOUS
  Filled 2014-03-15: qty 50

## 2014-03-15 NOTE — Progress Notes (Signed)
CRITICAL VALUE ALERT  Critical value received:  Potassium 2.8  Date of notification:  03/15/2014  Time of notification:  0526  Critical value read back:Yes.    Nurse who received alert:  Ophelia Charter RN  MD notified (1st page):  Donnamarie Poag NP  Time of first page:  0528  MD notified (2nd page):  Time of second page:  Responding MD: Donnamarie Poag NP  Time MD responded:  [phone]

## 2014-03-15 NOTE — Consult Note (Signed)
WOC wound consult note Reason for Consult: Repeat consult for patient.  Patient seen yesterday. Please refer to note dated 03/14/14.   WOC nursing team will not follow, but will remain available to this patient, the nursing and medical team.  Please re-consult if needed. Thanks, Ladona Mow, MSN, RN, GNP, [place], CWON-AP [phone])

## 2014-03-15 NOTE — Progress Notes (Addendum)
[facility] PROGRESS NOTE  Adilen Pavelko ZOX:[id number] DOB: [birth year] DOA: 03/13/2014 PCP: [name], MD   Brief narrative 68 year old female with paraplegia following spinal cord injury, hypertension who presents from home with a long ED on 03/13/2014 with worsening mental status for past few days. Patient was somnolent on admission. In the ED patient was in septic shock with hypotension, tachycardia, tachypnea and fever of 103.25F. Besides patient was hypoxic to 88%, encephalopathic and had elevated lactic acid . Patient admitted to step down unit for septic shock secondary to UTI.   Assessment/plan Septic Shock secondary to UTI Patient has bacteremia with Blood culture growing gram-negative rods. Sensitivity pending. Urine culture not sent no admission. Continue empiric Zosyn. We'll discontinue vancomycin. Has low-grade temperature past 24 hrs. -Sepsis resolving with stable vitals and improved leukocytosis. Getting supportive care with gentle IV hydration and tylenol.  Severe hypokalemia Replenish with IV and oral KCl. Replenish magnesium. Monitor in am  Acute metabolic encephalopathy Secondary to underlying sepsis .  mental status has improved to baseline  Paraplegia following spinal cord injury Stable.  Decubitus ulcer of the heel Appreciate wound care eval. recommends bilateral heel silicone foam dressing and placing patient's feet in  pressure redistribution boots  Acute urinary retention on 9/9 At baseline patient is incontinent. Foley placed overnight. Check renal ultrasound  DVT prophylaxis: Subcutaneous Lovenox  Diet: Heart healthy  Code Status: Full code Family Communication: son at  bedside Disposition Plan: Home possibly in the next 24-48 hrs   Consultants:  none  Procedures:  none  Antibiotics:  IV vancomycin 9/8-9/9  IV Zosyn 9/8-  HPI/Subjective: Patient had urinary retention overnight and required foley. Has lowe grade temp. No there  overnight sisue  Objective: Filed Vitals:   03/15/14 1059  BP: 112/55  Pulse: 85  Temp: 99.4 F (37.4 C)  Resp: 17    Intake/Output Summary (Last 24 hours) at 03/15/14 1505 Last data filed at 03/15/14 1252  Gross per 24 hour  Intake 2119.58 ml  Output      0 ml  Net 2119.58 ml   Filed Weights   03/14/14 0140 03/14/14 0400  Weight: 73.7 kg (162 lb 7.7 oz) 74.9 kg (165 lb 2 oz)    Exam:   General:  Elderly female lying in bed in no acute distress  HEENT: No pallor, moist oral mucosa  Cardiovascular: Normal S1 and S2, no murmurs  Respiratory: Clear bilaterally  Abdomen: , Nondistended, nontender,BS+  foley in place  Musculoskeletal: warm,  trace edema, b/l heel ulcer   CNS: AAOX2, paraplegic  Data Reviewed: Basic Metabolic Panel:  Recent Labs Lab 03/13/14 2140 03/13/14 2149 03/14/14 0630 03/15/14 0417  NA 142 138 143 138  K 3.5* 3.3* 3.3* 2.8*  CL 94* 98 100 98  CO2 27  --  28 27  GLUCOSE 163* 168* 185* 111*  BUN CREATININE 0.84 0.90 0.85 0.70  CALCIUM 9.5  --  8.6 8.5  MG  --   --  1.7  --   PHOS  --   --  3.4  --    Liver Function Tests:  Recent Labs Lab 03/13/14 2140 03/14/14 0630  AST 45* 36  ALT 44* 37*  ALKPHOS 123* 110  BILITOT 0.5 0.6  PROT 7.4 6.6  ALBUMIN 3.4* 3.0*   No results found for this basename: LIPASE, AMYLASE,  in the last 168 hours No results found for this basename: AMMONIA,  in the last 168 hours CBC:  Recent Labs Lab 03/13/14 2140 03/13/14 2149 03/14/14 0630 03/15/14 0417  WBC 19.5*  --  18.8* 10.2  HGB 14.3 15.0 12.6 10.9*  HCT 43.0 44.0 38.4 33.4*  MCV 82.9  --  83.8 83.7  PLT 214  --  197 173   Cardiac Enzymes:  Recent Labs Lab 03/14/14 0027 03/14/14 0630 03/14/14 1230  TROPONINI <0.30 <0.30 <0.30   BNP (last 3 results) No results found for this basename: PROBNP,  in the last 8760 hours CBG:  Recent Labs Lab 03/13/14 2232  GLUCAP 155*    Recent Results (from the past 240  hour(s))  CULTURE, BLOOD (ROUTINE X 2)     Status: None   Collection Time    03/13/14  9:40 PM      Result Value Ref Range Status   Specimen Description BLOOD RIGHT HAND   Final   Special Requests BOTTLES DRAWN AEROBIC AND ANAEROBIC 4CC   Final   Culture  Setup Time     Final   Value: 03/14/2014 01:23     Performed at [facility]   Culture     Final   Value: GRAM NEGATIVE RODS     Note: Gram Stain Report Called to,Read Back By and Verified With: DENNIS LITTLE 03/14/14 1152 BY SMITHERSJ     Performed at [facility]   Report Status PENDING   Incomplete  CULTURE, BLOOD (ROUTINE X 2)     Status: None   Collection Time    03/13/14 10:10 PM      Result Value Ref Range Status   Specimen Description BLOOD RIGHT WRIST   Final   Special Requests BOTTLES DRAWN AEROBIC AND ANAEROBIC 1CC   Final   Culture  Setup Time     Final   Value: 03/14/2014 01:22     Performed at [facility]   Culture     Final   Value: GRAM NEGATIVE RODS     Note: Gram Stain Report Called to,Read Back By and Verified With: GRACE SISON 03/14/14 1320 BY SMITHERSJ     Performed at [facility]   Report Status PENDING   Incomplete  MRSA PCR SCREENING     Status: Abnormal   Collection Time    03/14/14  3:17 AM      Result Value Ref Range Status   MRSA by PCR POSITIVE (*) NEGATIVE Final   Comment:            The GeneXpert MRSA Assay (FDA     approved for NASAL specimens     only), is one component of a     comprehensive MRSA colonization     surveillance program. It is not     intended to diagnose MRSA     infection nor to guide or     monitor treatment for     MRSA infections.     RESULT CALLED TO, READ BACK BY AND VERIFIED WITH:     B. DAVIS RN AT 0500 ON 09.08.15 BY SHUEA     Studies: Ct Head Wo Contrast  03/14/2014   CLINICAL DATA:  Altered mental status, fever  EXAM: CT HEAD WITHOUT CONTRAST  TECHNIQUE: Contiguous axial images were obtained from the base of the skull through  the vertex without intravenous contrast.  COMPARISON:  03/13/2007  FINDINGS: No evidence of parenchymal hemorrhage or extra-axial fluid collection. No mass lesion, mass effect, or midline shift.  No CT evidence of acute infarction.  Mild subcortical white  matter and periventricular small vessel ischemic changes. Intracranial atherosclerosis.  Cerebral volume is within normal limits.  No ventriculomegaly.  The visualized paranasal sinuses are essentially clear. The mastoid air cells are unopacified.  No evidence of calvarial fracture.  IMPRESSION: No evidence of acute intracranial abnormality.  Mild small vessel ischemic changes.   Electronically Signed   By: Charline Bills M.D.   On: 03/14/2014 01:23   Dg Chest Port 1 View  (if Code Sepsis Called)  03/13/2014   CLINICAL DATA:  Fever, altered mental status  EXAM: PORTABLE CHEST - 1 VIEW  COMPARISON:  03/16/2007  FINDINGS: Lungs are clear.  No pleural effusion or pneumothorax.  Mild cardiomegaly.  IMPRESSION: No evidence of acute cardiopulmonary disease.   Electronically Signed   By: Charline Bills M.D.   On: 03/13/2014 21:44    Scheduled Meds: . aspirin EC  81 mg Oral q morning - 10a  . enoxaparin (LOVENOX) injection  40 mg Subcutaneous Q24H  . ezetimibe  10 mg Oral q morning - 10a  . gabapentin  300 mg Oral TID  . pantoprazole  40 mg Oral QHS  . piperacillin-tazobactam (ZOSYN)  IV  3.375 g Intravenous 3 times per day  . sodium chloride  1,000 mL Intravenous Once  . sodium chloride  3 mL Intravenous Q12H  . traZODone  50 mg Oral QHS  . vancomycin  750 mg Intravenous Q12H  . Vortioxetine HBr  10 mg Oral TID   Continuous Infusions: . sodium chloride 50 mL/hr at 03/15/14 1054      Time spent: 25 minutes    Damaria Stofko  [facility] Pager [phone] If 7PM-7AM, please contact night-coverage at [email or web address], password [facility] 03/15/2014, 3:05 PM  LOS: 2 days

## 2014-03-15 NOTE — Progress Notes (Signed)
INITIAL NUTRITION ASSESSMENT  DOCUMENTATION CODES Per approved criteria  -Not Applicable   INTERVENTION: -Recommend Ensure Pudding BID, each supplement provides 170 kcal and 4 grams of protein -Recommend MVI -Encouraged PO intake -Will continue to monitor   NUTRITION DIAGNOSIS: Increased nutrient needs (protein/kcal) related to wound healing as evidenced by heel and sacral ulcers.   Goal: Pt to meet >/= 90% of their estimated nutrition needs    Monitor:  Total protein/energy intake, labs, weights, skin integrity  Reason for Assessment: Low Braden  68 y.o. female  Admitting Dx: <principal problem not specified>  ASSESSMENT: 68 year old female with paraplegia following spinal cord injury, hypertension who presents from home with a long ED on 03/13/2014 with worsening mental status for past few days   -Pt confused during time of assessment, unable to provide food/nutrition hx. Current PO intake 50% per RN documentation. PO intake likely sub-optimal past few days pta d/t d/t worsening mental status -Per RD assessment in 12/2013, pt was consuming Ensure pudding BID. Usual body weight reported to be 166 lbs, had good appetite pta.  -WOC evaluated pt on 9/08; recommend Ensure Pudding for additional nutrient replenishment/wound healing -Pt continues with fever r/t septic shock 2/2 UTI -Severe hypokalemia - being replaced IV and oral   Height: Ht Readings from Last 1 Encounters:  03/14/14  (1.549 m)    Weight: Wt Readings from Last 1 Encounters:  03/14/14 165 lb 2 oz (74.9 kg)    Ideal Body Weight: 105 lbs  % Ideal Body Weight: 157%  Wt Readings from Last 10 Encounters:  03/14/14 165 lb 2 oz (74.9 kg)  06/10/13 173 lb (78.472 kg)    Usual Body Weight: 166  % Usual Body Weight: 100%  BMI:  Body mass index is 31.22 kg/(m^2).  Estimated Nutritional Needs: Kcal: 1500-1700 Protein: 75-90 gram Fluid: >/=1500 ml/daily  Skin: stg 3 heel ulcer, pressure ulcer on  sacrum  Diet Order: Cardiac  EDUCATION NEEDS: -No education needs identified at this time   Intake/Output Summary (Last 24 hours) at 03/15/14 1632 Last data filed at 03/15/14 1252  Gross per 24 hour  Intake 2119.58 ml  Output      0 ml  Net 2119.58 ml    Last BM: PTA   Labs:   Recent Labs Lab 03/13/14 2140 03/13/14 2149 03/14/14 0630 03/15/14 0417  NA 142 138 143 138  K 3.5* 3.3* 3.3* 2.8*  CL 94* 98 100 98  CO2 27  --  28 27  BUN CREATININE 0.84 0.90 0.85 0.70  CALCIUM 9.5  --  8.6 8.5  MG  --   --  1.7  --   PHOS  --   --  3.4  --   GLUCOSE 163* 168* 185* 111*    CBG (last 3)   Recent Labs  03/13/14 2232  GLUCAP 155*    Scheduled Meds: . aspirin EC  81 mg Oral q morning - 10a  . ezetimibe  10 mg Oral q morning - 10a  . gabapentin  300 mg Oral TID  . pantoprazole  40 mg Oral QHS  . piperacillin-tazobactam (ZOSYN)  IV  3.375 g Intravenous 3 times per day  . sodium chloride  1,000 mL Intravenous Once  . sodium chloride  3 mL Intravenous Q12H  . traZODone  50 mg Oral QHS  . Vortioxetine HBr  10 mg Oral TID    Continuous Infusions: . sodium chloride 50 mL/hr at 03/15/14 1054  Past Medical History  Diagnosis Date  . GERD (gastroesophageal reflux disease)   . Quadriplegia   . Hypertension   . Pressure ulcer of foot   . Chronic pain disorder   . Hyperlipidemia     Past Surgical History  Procedure Laterality Date  . Back surgery  08/2013  . Abdominal hysterectomy      Lloyd Huger MS RD LDN Clinical Dietitian [phone]

## 2014-03-15 NOTE — Progress Notes (Signed)
This patient is receiving IV Protonix. Based on criteria approved by the Pharmacy and Therapeutics Committee, this medication is being converted to the equivalent oral dose form. These criteria include: . The patient is eating (either orally or per tube) and/or has been taking other orally administered medications for at least 24 hours. . This patient has no evidence of active gastrointestinal bleeding or impaired GI absorption (gastrectomy, short bowel, patient on TNA or NPO).  If you have questions about this conversion, please contact the pharmacy department. Thank you.  Clance Boll, PharmD, BCPS Pager: [phone] 03/15/2014 7:57 AM

## 2014-03-16 ENCOUNTER — Ambulatory Visit: Payer: Self-pay | Admitting: Physical Therapy

## 2014-03-16 ENCOUNTER — Inpatient Hospital Stay (HOSPITAL_COMMUNITY): Payer: Medicare Other

## 2014-03-16 DIAGNOSIS — A4159 Other Gram-negative sepsis: Secondary | ICD-10-CM | POA: Diagnosis present

## 2014-03-16 DIAGNOSIS — R7881 Bacteremia: Secondary | ICD-10-CM

## 2014-03-16 DIAGNOSIS — B961 Klebsiella pneumoniae [K. pneumoniae] as the cause of diseases classified elsewhere: Secondary | ICD-10-CM

## 2014-03-16 DIAGNOSIS — A414 Sepsis due to anaerobes: Secondary | ICD-10-CM | POA: Diagnosis present

## 2014-03-16 LAB — CULTURE, BLOOD (ROUTINE X 2)

## 2014-03-16 LAB — BASIC METABOLIC PANEL
Anion gap: 10 (ref 5–15)
BUN: 7 mg/dL (ref 6–23)
CHLORIDE: 97 meq/L (ref 96–112)
CO2: 26 mEq/L (ref 19–32)
CREATININE: 0.53 mg/dL (ref 0.50–1.10)
Calcium: 9 mg/dL (ref 8.4–10.5)
GFR calc Af Amer: >90 mL/min (ref >90)
Glucose, Bld: 117 mg/dL — ABNORMAL HIGH (ref 70–99)
Potassium: 2.8 mEq/L — CL (ref 3.7–5.3)
Sodium: 133 mEq/L — ABNORMAL LOW (ref 137–147)

## 2014-03-16 LAB — MAGNESIUM: Magnesium: 1.8 mg/dL (ref 1.5–2.5)

## 2014-03-16 LAB — PROCALCITONIN: Procalcitonin: 1.58 ng/mL

## 2014-03-16 MED ORDER — MUPIROCIN 2 % EX OINT
1.0000 "application " | TOPICAL_OINTMENT | Freq: Two times a day (BID) | CUTANEOUS | Status: DC
  Administered 2014-03-16 – 2014-03-17 (×3): 1 via NASAL
  Filled 2014-03-16: qty 22

## 2014-03-16 MED ORDER — DEXTROSE 5 % IV SOLN
2.0000 g | INTRAVENOUS | Status: DC
  Administered 2014-03-16: 2 g via INTRAVENOUS
  Filled 2014-03-16 (×2): qty 2

## 2014-03-16 MED ORDER — POTASSIUM CHLORIDE CRYS ER 20 MEQ PO TBCR
40.0000 meq | EXTENDED_RELEASE_TABLET | ORAL | Status: AC
  Administered 2014-03-16: 40 meq via ORAL
  Filled 2014-03-16 (×2): qty 2

## 2014-03-16 MED ORDER — CHLORHEXIDINE GLUCONATE CLOTH 2 % EX PADS
6.0000 | MEDICATED_PAD | Freq: Every day | CUTANEOUS | Status: DC
  Administered 2014-03-17: 6 via TOPICAL

## 2014-03-16 MED ORDER — MAGNESIUM SULFATE 40 MG/ML IJ SOLN
2.0000 g | Freq: Once | INTRAMUSCULAR | Status: AC
  Administered 2014-03-16: 2 g via INTRAVENOUS
  Filled 2014-03-16: qty 50

## 2014-03-16 MED ORDER — POTASSIUM CHLORIDE 10 MEQ/100ML IV SOLN
10.0000 meq | INTRAVENOUS | Status: AC
  Administered 2014-03-16: 10 meq via INTRAVENOUS
  Filled 2014-03-16 (×4): qty 100

## 2014-03-16 NOTE — Progress Notes (Signed)
[facility] PROGRESS NOTE  Harleyquinn Gasser ZOX:[id number] DOB: 1945/10/17 DOA: 03/13/2014 PCP: [name], MD   Brief narrative 68 year old female with paraplegia following spinal cord injury, hypertension who presents from home with a long ED on 03/13/2014 with worsening mental status for past few days. Patient was somnolent on admission. In the ED patient was in septic shock with hypotension, tachycardia, tachypnea and fever of 103.342F. Besides patient was hypoxic to 88%, encephalopathic and had elevated lactic acid . Patient admitted to step down unit for septic shock secondary to UTI.   Assessment/plan Septic Shock secondary to UTI Patient has bacteremia with Blood culture growing see a lot which is pan sensitive (possibly urinary source). Switch to by take to IV Rocephin. Tmax of 101.42F -stable vitals and improved leukocytosis. supportive care with gentle IV hydration and tylenol.  Severe persistent hypokalemia Replenish with IV and oral KCl. Replenish magnesium. Monitor on telemetry  Acute metabolic encephalopathy Secondary to underlying sepsis .  mental status has improved to baseline  Paraplegia following spinal cord injury Stable.  Decubitus ulcer of the heel Appreciate wound care eval. recommends bilateral heel silicone foam dressing and placing patient's feet in  pressure redistribution boots  Acute urinary retention on 9/9 At baseline patient is incontinent. Foley placed overnight. renal ultrasound unremarkable. We'll attempt voiding trial in am  Protein calorie malnutrition Continue supplements  DVT prophylaxis: Subcutaneous Lovenox  Diet: Heart healthy  Code Status: Full code Family Communication: none at  bedside Disposition Plan: Home possibly on 9/11 if afebrile   Consultants:  none  Procedures:  none  Antibiotics:  IV vancomycin 9/8-9/9  IV Zosyn 9/8-9/10  IV rocephin 9/10--  HPI/Subjective: Febrile to 101.6 overnight. Denies any other  symptoms  Objective: Filed Vitals:   03/16/14 1449  BP: 152/69  Pulse: 75  Temp: 98.9 F (37.2 C)  Resp: 18    Intake/Output Summary (Last 24 hours) at 03/16/14 1649 Last data filed at 03/16/14 1450  Gross per 24 hour  Intake 3319.17 ml  Output   3750 ml  Net -430.83 ml   Filed Weights   03/14/14 0140 03/14/14 0400  Weight: 73.7 kg (162 lb 7.7 oz) 74.9 kg (165 lb 2 oz)    Exam:   General:   no acute distress  HEENT:  moist oral mucosa  Cardiovascular: Normal S1 and S2, no murmurs  Respiratory: Clear bilaterally  Abdomen: , Nondistended, nontender,BS+  foley in place  Musculoskeletal: warm,  trace edema, b/l heel ulcer   CNS: AAOX2, paraplegic  Data Reviewed: Basic Metabolic Panel:  Recent Labs Lab 03/13/14 2140 03/13/14 2149 03/14/14 0630 03/15/14 0417 03/16/14 0438  NA 142 138 143 138 133*  K 3.5* 3.3* 3.3* 2.8* 2.8*  CL 94* 98 100 98 97  CO2 27  --  GLUCOSE 163* 168* 185* 111* 117*  BUN CREATININE 0.84 0.90 0.85 0.70 0.53  CALCIUM 9.5  --  8.6 8.5 9.0  MG  --   --  1.7  --  1.8  PHOS  --   --  3.4  --   --    Liver Function Tests:  Recent Labs Lab 03/13/14 2140 03/14/14 0630  AST 45* 36  ALT 44* 37*  ALKPHOS 123* 110  BILITOT 0.5 0.6  PROT 7.4 6.6  ALBUMIN 3.4* 3.0*   No results found for this basename: LIPASE, AMYLASE,  in the last 168 hours No results found for this basename: AMMONIA,  in the last 168 hours CBC:  Recent Labs Lab 03/13/14 2140 03/13/14 2149 03/14/14 0630 03/15/14 0417  WBC 19.5*  --  18.8* 10.2  HGB 14.3 15.0 12.6 10.9*  HCT 43.0 44.0 38.4 33.4*  MCV 82.9  --  83.8 83.7  PLT 214  --  197 173   Cardiac Enzymes:  Recent Labs Lab 03/14/14 0027 03/14/14 0630 03/14/14 1230  TROPONINI <0.30 <0.30 <0.30   BNP (last 3 results) No results found for this basename: PROBNP,  in the last 8760 hours CBG:  Recent Labs Lab 03/13/14 2232  GLUCAP 155*    Recent Results (from the  past 240 hour(s))  CULTURE, BLOOD (ROUTINE X 2)     Status: None   Collection Time    03/13/14  9:40 PM      Result Value Ref Range Status   Specimen Description BLOOD RIGHT HAND   Final   Special Requests BOTTLES DRAWN AEROBIC AND ANAEROBIC 4CC   Final   Culture  Setup Time     Final   Value: 03/14/2014 01:23     Performed at [facility]   Culture     Final   Value: KLEBSIELLA PNEUMONIAE     Note: SUSCEPTIBILITIES PERFORMED ON PREVIOUS CULTURE WITHIN THE LAST 5 DAYS.     Note: Gram Stain Report Called to,Read Back By and Verified With: DENNIS LITTLE 03/14/14 1152 BY SMITHERSJ     Performed at [facility]   Report Status 03/16/2014 FINAL   Final  CULTURE, BLOOD (ROUTINE X 2)     Status: None   Collection Time    03/13/14 10:10 PM      Result Value Ref Range Status   Specimen Description BLOOD RIGHT WRIST   Final   Special Requests BOTTLES DRAWN AEROBIC AND ANAEROBIC 1CC   Final   Culture  Setup Time     Final   Value: 03/14/2014 01:22     Performed at [facility]   Culture     Final   Value: KLEBSIELLA PNEUMONIAE     Note: Gram Stain Report Called to,Read Back By and Verified With: GRACE SISON 03/14/14 1320 BY SMITHERSJ     Performed at [facility]   Report Status 03/16/2014 FINAL   Final   Organism ID, Bacteria KLEBSIELLA PNEUMONIAE   Final  MRSA PCR SCREENING     Status: Abnormal   Collection Time    03/14/14  3:17 AM      Result Value Ref Range Status   MRSA by PCR POSITIVE (*) NEGATIVE Final   Comment:            The GeneXpert MRSA Assay (FDA     approved for NASAL specimens     only), is one component of a     comprehensive MRSA colonization     surveillance program. It is not     intended to diagnose MRSA     infection nor to guide or     monitor treatment for     MRSA infections.     RESULT CALLED TO, READ BACK BY AND VERIFIED WITH:     B. DAVIS RN AT 0500 ON 09.08.15 BY SHUEA     Studies: US Abdomen Complete  03/16/2014    CLINICAL DATA:  Urinary retention.  EXAM: ULTRASOUND ABDOMEN COMPLETE  COMPARISON:  Ultrasound abdomen 03/30/2013  FINDINGS: Gallbladder:  Gallbladder wall thickening is mild measuring 4.3 mm. Negative sonographic Murphy sign. No gallstones.  Common bile duct:  Diameter: 4.3 mm  Liver:  Fatty infiltration of liver without focal mass lesion.  IVC:  No abnormality visualized.  Pancreas:  Visualized portion unremarkable.  Spleen:  Size and appearance within normal limits.  Right Kidney:  Length: 11.1 cm. Echogenicity within normal limits. No mass or hydronephrosis visualized.  Left Kidney:  Length: 10.6 cm. Echogenicity within normal limits. No mass or hydronephrosis visualized.  Abdominal aorta:  No aneurysm visualized.  Other findings:  None  IMPRESSION: Mild gallbladder wall thickening without gallstones or focal pain over the gallbladder.  Hepatic steatosis.   Electronically Signed   By: Marlan Palau M.D.   On: 03/16/2014 16:26    Scheduled Meds: . aspirin EC  81 mg Oral q morning - 10a  . cefTRIAXone (ROCEPHIN)  IV  2 g Intravenous Q24H  . [START ON 03/17/2014] Chlorhexidine Gluconate Cloth  6 each Topical Q0600  . ezetimibe  10 mg Oral q morning - 10a  . feeding supplement (ENSURE)  1 Container Oral BID BM  . gabapentin  300 mg Oral TID  . multivitamin  5 mL Oral Daily  . mupirocin ointment  1 application Nasal BID  . pantoprazole  40 mg Oral QHS  . sodium chloride  1,000 mL Intravenous Once  . sodium chloride  3 mL Intravenous Q12H  . traZODone  50 mg Oral QHS  . Vortioxetine HBr  10 mg Oral TID   Continuous Infusions: . sodium chloride 50 mL/hr at 03/16/14 1147      Time spent: 25 minutes    Tanika Bracco  [facility] Pager [phone] If 7PM-7AM, please contact night-coverage at [email or web address], password [facility] 03/16/2014, 4:49 PM  LOS: 3 days

## 2014-03-16 NOTE — Progress Notes (Signed)
CRITICAL VALUE ALERT  Critical value received:  Potassium 2.8  Date of notification:  9/10  Time of notification:  0525  Critical value read back:Yes.    Nurse who received alert:  J.Krishika Bugge  MD notified (1st page):  K.Kirby  Time of first page:  0526  MD notified (2nd page):  Time of second page:  Responding MD:  K.Kirby  Time MD responded:  [phone]

## 2014-03-17 DIAGNOSIS — A419 Sepsis, unspecified organism: Secondary | ICD-10-CM

## 2014-03-17 DIAGNOSIS — R6520 Severe sepsis without septic shock: Secondary | ICD-10-CM

## 2014-03-17 DIAGNOSIS — R6521 Severe sepsis with septic shock: Secondary | ICD-10-CM

## 2014-03-17 LAB — BASIC METABOLIC PANEL
Anion gap: 14 (ref 5–15)
BUN: 6 mg/dL (ref 6–23)
CO2: 23 mEq/L (ref 19–32)
Calcium: 8.9 mg/dL (ref 8.4–10.5)
Chloride: 99 mEq/L (ref 96–112)
Creatinine, Ser: 0.46 mg/dL — ABNORMAL LOW (ref 0.50–1.10)
GFR calc Af Amer: >90 mL/min (ref >90)
GFR calc non Af Amer: >90 mL/min (ref >90)
GLUCOSE: 120 mg/dL — AB (ref 70–99)
Potassium: 3.5 mEq/L — ABNORMAL LOW (ref 3.7–5.3)
SODIUM: 136 meq/L — AB (ref 137–147)

## 2014-03-17 MED ORDER — ENSURE PUDDING PO PUDG: 1.0000 | Freq: Two times a day (BID) | ORAL | Status: DC

## 2014-03-17 MED ORDER — POTASSIUM CHLORIDE CRYS ER 20 MEQ PO TBCR
40.0000 meq | EXTENDED_RELEASE_TABLET | Freq: Once | ORAL | Status: AC
  Administered 2014-03-17: 40 meq via ORAL
  Filled 2014-03-17: qty 2

## 2014-03-17 MED ORDER — LORAZEPAM 1 MG PO TABS: 1.0000 mg | ORAL_TABLET | Freq: Two times a day (BID) | ORAL | Status: DC | PRN

## 2014-03-17 MED ORDER — CIPROFLOXACIN HCL 500 MG PO TABS: 500.0000 mg | ORAL_TABLET | Freq: Two times a day (BID) | ORAL | Status: AC

## 2014-03-17 NOTE — Progress Notes (Signed)
CARE MANAGEMENT NOTE 03/17/2014  Patient:  [name] [name], [name] [name]   Account Number:  [id number]  Date Initiated:  03/17/2014  Documentation initiated by:  Ezekiel Ina  Subjective/Objective Assessment:   pt admitted with UTI     Action/Plan:   from home   Anticipated DC Date:  03/17/2014   Anticipated DC Plan:  HOME W HOME HEALTH SERVICES      DC Planning Services  CM consult      Choice offered to / List presented to:  C-1 Patient        HH arranged  HH-1 RN  HH-2 PT  HH-4 NURSE'S AIDE      HH agency  [employer]   Status of service:  Completed, signed off Medicare Important Message given?  YES (If response is "NO", the following Medicare IM given date fields will be blank) Date Medicare IM given:  03/17/2014 Medicare IM given by:  Ezekiel Ina Date Additional Medicare IM given:   Additional Medicare IM given by:    Discharge Disposition:  HOME W HOME HEALTH SERVICES  Per UR Regulation:  Reviewed for med. necessity/level of care/duration of stay  If discussed at Long Length of Stay Meetings, dates discussed:    Comments:  03/17/14 MMcGibboney, RN, BSN Spoke with Wayne Sever, RN, CCM with [employer] Worker [employer]  conerning pt's discharge and HH orders. Pt is active with [facility], and will continue with them. [phone]).   03/16/14 MMcGibboney, RN, BSN Spoke with pt's who then asked me to speak with her son Orson Gear [phone]),  in her room, concerning pt's discharge need. He request that I call pt's [employer] account planner [phone]). A call was made to [facility] with no answer and her voice mail was full. Therefore no VM was lefted. Will try again in am.

## 2014-03-17 NOTE — Discharge Summary (Addendum)
Physician Discharge Summary  [name] [name] WJX:[id number] DOB: 1946/04/01 DOA: 03/13/2014  PCP: [name], MD  Admit date: 03/13/2014 Discharge date: 03/17/2014  Time spent: 35 minutes  Recommendations for Outpatient Follow-up:  1. Discharge home the dose of home health RN, PT, OT and nursing aide 2. Followup with PCP in one week and check potassium level as outpatient  Discharge Diagnoses:  Principal Problem:   Septic shock   Active Problems:   Klebsiella sepsis   Bacteremia due to Klebsiella pneumoniae   UTI (lower urinary tract infection)   Metabolic encephalopathy   Decubitus ulcer, heel   Hypokalemia   Paraplegia following spinal cord injury   Urinary retention      Discharge Condition: Fair  Diet recommendation: Heart healthy  Filed Weights   03/14/14 0140 03/14/14 0400  Weight: 73.7 kg (162 lb 7.7 oz) 74.9 kg (165 lb 2 oz)    History of present illness:  Please refer to admission H&P for details, but in brief,68 year old female with paraplegia following spinal cord injury, hypertension who presents from home with a long ED on 03/13/2014 with worsening mental status for past few days. Patient was somnolent on admission. In the ED patient was in septic shock with hypotension, tachycardia, tachypnea and fever of 103.50F. Besides patient was hypoxic to 88%, encephalopathic and had elevated lactic acid . Patient admitted to step down unit for septic shock secondary to UTI.   Hospital Course:  Septic Shock secondary to UTI  Patient has bacteremia with Blood culture growing klebsiella  which is pan sensitive (possibly urinary source). -Patient was started on empiric Zosyn which was switched  to IV Rocephin. Has been afebrile for past 24 hours. -Vitals stable. Her encephalopathy has resolved. Patient will be discharged on oral ciprofloxacin 500 mg twice daily to complete a two-week course of antibiotic.  Severe persistent hypokalemia  Replenish with IV and oral KCl.  Replenish magnesium. Stable on telemetry. Resume home potassium and magnesium upon discharge. Check lab doing PCP followup.  Acute metabolic encephalopathy  Secondary to sepsis . Resolved  Paraplegia following spinal cord injury  Stable.    Decubitus ulcer of the heel  Appreciate wound care eval. recommends bilateral heel silicone foam dressing and placing patient's feet in pressure redistribution boots   Acute urinary retention on 9/9  At baseline patient is incontinent. She is on Flomax at home. Foley placed while in the hospital. renal ultrasound unremarkable. Catheter removed this am but patient has not voided yet and has 200 cc on bladder scan . discussed about discharging her on foley but both patient and her so refuse for it. Both were instructed that if she develops urinary retention she will need a foley placed in and they agree for it.   Protein calorie malnutrition  Added nutrition supplements   Hypertension Resume home medications  Patient is clinically stable to be discharged home with outpatient followup.    Diet: Heart healthy   Code Status: Full code   Family Communication: none at bedside   Disposition Plan: Home ( with home health resumed, has 24/7 home care)  Consultants:  None  Procedures:  None  Antibiotics:  IV vancomycin 9/8-9/9  IV Zosyn 9/8-9/10  IV rocephin 9/10--   Discharge Exam: Filed Vitals:   03/16/14 2351  BP: 145/70  Pulse: 80  Temp: 98.7 F (37.1 C)  Resp: 18   General: Elderly female in no acute distress  HEENT: moist oral mucosa  Cardiovascular: Normal S1 and S2, no murmurs  Respiratory:  Clear bilaterally  Abdomen: , Nondistended, nontender,BS+ foley in place  Musculoskeletal: warm, trace edema, b/l heel ulcer  CNS: AAOX2, paraplegic  Discharge Instructions You were cared for by a hospitalist during your hospital stay. If you have any questions about your discharge medications or the care you received while you were  in the hospital after you are discharged, you can call the unit and asked to speak with the hospitalist on call if the hospitalist that took care of you is not available. Once you are discharged, your primary care physician will handle any further medical issues. Please note that NO REFILLS for any discharge medications will be authorized once you are discharged, as it is imperative that you return to your primary care physician (or establish a relationship with a primary care physician if you do not have one) for your aftercare needs so that they can reassess your need for medications and monitor your lab values.   Current Discharge Medication List    START taking these medications   Details  ciprofloxacin (CIPRO) 500 MG tablet Take 1 tablet (500 mg total) by mouth 2 (two) times daily. Qty: 20 tablet, Refills: 0 until 03/26/2014     feeding supplement, ENSURE, (ENSURE) PUDG Take 1 Container by mouth 2 (two) times daily between meals. Qty: 60 Container, Refills: 0      CONTINUE these medications which have CHANGED   Details  LORazepam (ATIVAN) 1 MG tablet Take 1 tablet (1 mg total) by mouth every 12 (twelve) hours as needed for anxiety. Qty: 20 tablet, Refills: 0      CONTINUE these medications which have NOT CHANGED   Details  aspirin EC 81 MG tablet Take 81 mg by mouth every morning.     baclofen (LIORESAL) 10 MG tablet Take 10 mg by mouth 3 (three) times daily.    Choline Fenofibrate (TRILIPIX) 135 MG capsule Take 135 mg by mouth every morning.     ezetimibe (ZETIA) 10 MG tablet Take 10 mg by mouth every morning.     gabapentin (NEURONTIN) 300 MG capsule Take 300 mg by mouth 3 (three) times daily.    magnesium oxide (MAG-OX) 400 MG tablet Take 400 mg by mouth 2 (two) times daily.    metolazone (ZAROXOLYN) 2.5 MG tablet Take 2.5 mg by mouth every morning.     Multiple Vitamin (MULTIVITAMIN WITH MINERALS) TABS tablet Take 1 tablet by mouth every morning.     omeprazole  (PRILOSEC) 20 MG capsule Take 20 mg by mouth every morning.     OxyCODONE (OXYCONTIN) 20 mg T12A 12 hr tablet Take 40 mg by mouth 2 (two) times daily.     polyethylene glycol (MIRALAX / GLYCOLAX) packet Take 17 g by mouth daily as needed for mild constipation.    potassium chloride SA (K-DUR,KLOR-CON) 20 MEQ tablet Take 20 mEq by mouth 3 (three) times daily.    promethazine (PHENERGAN) 25 MG tablet Take 25 mg by mouth every 6 (six) hours as needed for nausea or vomiting.    sodium phosphate (FLEET) enema Place 1 enema rectally daily as needed (constipation). follow package directions    spironolactone (ALDACTONE) 25 MG tablet Take 25 mg by mouth 2 (two) times daily.    tamsulosin (FLOMAX) 0.4 MG CAPS capsule Take 0.4 mg by mouth every morning.     torsemide (DEMADEX) 20 MG tablet Take 20 mg by mouth every morning.     traZODone (DESYREL) 50 MG tablet Take 50 mg by mouth at bedtime.  trifluoperazine (STELAZINE) 5 MG tablet Take 5 mg by mouth every morning.     Vortioxetine HBr (BRINTELLIX) 10 MG TABS Take 10 mg by mouth 3 (three) times daily.     zolpidem (AMBIEN) 5 MG tablet Take 5 mg by mouth at bedtime as needed for sleep.       Allergies  Allergen Reactions  . Decadrol [Dexamethasone] Swelling  . Decadron [Dexamethasone] Swelling  . Sodium Phosphate Swelling   Follow-up Information   Follow up with [name], MD In 1 week. (needs potassium level checked)    Specialty:  Family Medicine   Contact information:   [address] [place] [address] [phone]        The results of significant diagnostics from this hospitalization (including imaging, microbiology, ancillary and laboratory) are listed below for reference.    Significant Diagnostic Studies: Ct Head Wo Contrast  03/14/2014   CLINICAL DATA:  Altered mental status, fever  EXAM: CT HEAD WITHOUT CONTRAST  TECHNIQUE: Contiguous axial images were obtained from the base of the skull through the vertex without  intravenous contrast.  COMPARISON:  03/13/2007  FINDINGS: No evidence of parenchymal hemorrhage or extra-axial fluid collection. No mass lesion, mass effect, or midline shift.  No CT evidence of acute infarction.  Mild subcortical white matter and periventricular small vessel ischemic changes. Intracranial atherosclerosis.  Cerebral volume is within normal limits.  No ventriculomegaly.  The visualized paranasal sinuses are essentially clear. The mastoid air cells are unopacified.  No evidence of calvarial fracture.  IMPRESSION: No evidence of acute intracranial abnormality.  Mild small vessel ischemic changes.   Electronically Signed   By: Charline Bills M.D.   On: 03/14/2014 01:23   US Abdomen Complete  03/16/2014   CLINICAL DATA:  Urinary retention.  EXAM: ULTRASOUND ABDOMEN COMPLETE  COMPARISON:  Ultrasound abdomen 03/30/2013  FINDINGS: Gallbladder:  Gallbladder wall thickening is mild measuring 4.3 mm. Negative sonographic Murphy sign. No gallstones.  Common bile duct:  Diameter: 4.3 mm  Liver:  Fatty infiltration of liver without focal mass lesion.  IVC:  No abnormality visualized.  Pancreas:  Visualized portion unremarkable.  Spleen:  Size and appearance within normal limits.  Right Kidney:  Length: 11.1 cm. Echogenicity within normal limits. No mass or hydronephrosis visualized.  Left Kidney:  Length: 10.6 cm. Echogenicity within normal limits. No mass or hydronephrosis visualized.  Abdominal aorta:  No aneurysm visualized.  Other findings:  None  IMPRESSION: Mild gallbladder wall thickening without gallstones or focal pain over the gallbladder.  Hepatic steatosis.   Electronically Signed   By: Marlan Palau M.D.   On: 03/16/2014 16:26   Dg Chest Port 1 View  (if Code Sepsis Called)  03/13/2014   CLINICAL DATA:  Fever, altered mental status  EXAM: PORTABLE CHEST - 1 VIEW  COMPARISON:  03/16/2007  FINDINGS: Lungs are clear.  No pleural effusion or pneumothorax.  Mild cardiomegaly.  IMPRESSION: No  evidence of acute cardiopulmonary disease.   Electronically Signed   By: Charline Bills M.D.   On: 03/13/2014 21:44    Microbiology: Recent Results (from the past 240 hour(s))  CULTURE, BLOOD (ROUTINE X 2)     Status: None   Collection Time    03/13/14  9:40 PM      Result Value Ref Range Status   Specimen Description BLOOD RIGHT HAND   Final   Special Requests BOTTLES DRAWN AEROBIC AND ANAEROBIC 4CC   Final   Culture  Setup Time  Final   Value: 03/14/2014 01:23     Performed at [facility]   Culture     Final   Value: KLEBSIELLA PNEUMONIAE     Note: SUSCEPTIBILITIES PERFORMED ON PREVIOUS CULTURE WITHIN THE LAST 5 DAYS.     Note: Gram Stain Report Called to,Read Back By and Verified With: DENNIS LITTLE 03/14/14 1152 BY SMITHERSJ     Performed at [facility]   Report Status 03/16/2014 FINAL   Final  CULTURE, BLOOD (ROUTINE X 2)     Status: None   Collection Time    03/13/14 10:10 PM      Result Value Ref Range Status   Specimen Description BLOOD RIGHT WRIST   Final   Special Requests BOTTLES DRAWN AEROBIC AND ANAEROBIC 1CC   Final   Culture  Setup Time     Final   Value: 03/14/2014 01:22     Performed at [facility]   Culture     Final   Value: KLEBSIELLA PNEUMONIAE     Note: Gram Stain Report Called to,Read Back By and Verified With: GRACE SISON 03/14/14 1320 BY SMITHERSJ     Performed at [facility]   Report Status 03/16/2014 FINAL   Final   Organism ID, Bacteria KLEBSIELLA PNEUMONIAE   Final  MRSA PCR SCREENING     Status: Abnormal   Collection Time    03/14/14  3:17 AM      Result Value Ref Range Status   MRSA by PCR POSITIVE (*) NEGATIVE Final   Comment:            The GeneXpert MRSA Assay (FDA     approved for NASAL specimens     only), is one component of a     comprehensive MRSA colonization     surveillance program. It is not     intended to diagnose MRSA     infection nor to guide or     monitor treatment for     MRSA  infections.     RESULT CALLED TO, READ BACK BY AND VERIFIED WITH:     B. DAVIS RN AT 0500 ON 09.08.15 BY SHUEA     Labs: Basic Metabolic Panel:  Recent Labs Lab 03/13/14 2140 03/13/14 2149 03/14/14 0630 03/15/14 0417 03/16/14 0438 03/17/14 0450  NA 142 138 143 138 133* 136*  K 3.5* 3.3* 3.3* 2.8* 2.8* 3.5*  CL 94* 98 100 98 97 99  CO2 27  --  GLUCOSE 163* 168* 185* 111* 117* 120*  BUN CREATININE 0.84 0.90 0.85 0.70 0.53 0.46*  CALCIUM 9.5  --  8.6 8.5 9.0 8.9  MG  --   --  1.7  --  1.8  --   PHOS  --   --  3.4  --   --   --    Liver Function Tests:  Recent Labs Lab 03/13/14 2140 03/14/14 0630  AST 45* 36  ALT 44* 37*  ALKPHOS 123* 110  BILITOT 0.5 0.6  PROT 7.4 6.6  ALBUMIN 3.4* 3.0*   No results found for this basename: LIPASE, AMYLASE,  in the last 168 hours No results found for this basename: AMMONIA,  in the last 168 hours CBC:  Recent Labs Lab 03/13/14 2140 03/13/14 2149 03/14/14 0630 03/15/14 0417  WBC 19.5*  --  18.8* 10.2  HGB 14.3 15.0 12.6 10.9*  HCT 43.0 44.0 38.4 33.4*  MCV 82.9  --  83.8 83.7  PLT 214  --  197 173   Cardiac Enzymes:  Recent Labs Lab 03/14/14 0027 03/14/14 0630 03/14/14 1230  TROPONINI <0.30 <0.30 <0.30   BNP: BNP (last 3 results) No results found for this basename: PROBNP,  in the last 8760 hours CBG:  Recent Labs Lab 03/13/14 2232  GLUCAP 155*       Signed:  Dionne Rossa  [facility] 03/17/2014, 10:28 AM

## 2014-03-20 ENCOUNTER — Ambulatory Visit: Payer: Self-pay | Admitting: Physical Therapy

## 2014-03-23 ENCOUNTER — Ambulatory Visit: Payer: Medicare Other | Attending: Family Medicine | Admitting: Physical Therapy

## 2014-03-23 DIAGNOSIS — Z981 Arthrodesis status: Secondary | ICD-10-CM | POA: Diagnosis not present

## 2014-03-23 DIAGNOSIS — R262 Difficulty in walking, not elsewhere classified: Secondary | ICD-10-CM | POA: Insufficient documentation

## 2014-03-23 DIAGNOSIS — IMO0001 Reserved for inherently not codable concepts without codable children: Secondary | ICD-10-CM | POA: Diagnosis not present

## 2014-03-23 DIAGNOSIS — M2420 Disorder of ligament, unspecified site: Secondary | ICD-10-CM | POA: Insufficient documentation

## 2014-03-23 DIAGNOSIS — M629 Disorder of muscle, unspecified: Secondary | ICD-10-CM | POA: Diagnosis not present

## 2014-03-26 ENCOUNTER — Encounter (HOSPITAL_COMMUNITY): Payer: Self-pay | Admitting: Emergency Medicine

## 2014-03-26 ENCOUNTER — Inpatient Hospital Stay (HOSPITAL_COMMUNITY)
Admission: EM | Admit: 2014-03-26 | Discharge: 2014-03-31 | DRG: 871 | Disposition: A | Discharge status: Home / Self Care | Payer: Medicare Other | Attending: Internal Medicine | Admitting: Internal Medicine

## 2014-03-26 ENCOUNTER — Emergency Department (HOSPITAL_COMMUNITY): Payer: Medicare Other

## 2014-03-26 DIAGNOSIS — L8993 Pressure ulcer of unspecified site, stage 3: Secondary | ICD-10-CM | POA: Diagnosis present

## 2014-03-26 DIAGNOSIS — N390 Urinary tract infection, site not specified: Secondary | ICD-10-CM | POA: Diagnosis present

## 2014-03-26 DIAGNOSIS — E785 Hyperlipidemia, unspecified: Secondary | ICD-10-CM | POA: Diagnosis present

## 2014-03-26 DIAGNOSIS — I4729 Other ventricular tachycardia: Secondary | ICD-10-CM

## 2014-03-26 DIAGNOSIS — D500 Iron deficiency anemia secondary to blood loss (chronic): Secondary | ICD-10-CM | POA: Diagnosis present

## 2014-03-26 DIAGNOSIS — I472 Ventricular tachycardia: Secondary | ICD-10-CM

## 2014-03-26 DIAGNOSIS — A419 Sepsis, unspecified organism: Principal | ICD-10-CM | POA: Diagnosis present

## 2014-03-26 DIAGNOSIS — G8250 Quadriplegia, unspecified: Secondary | ICD-10-CM | POA: Diagnosis present

## 2014-03-26 DIAGNOSIS — I959 Hypotension, unspecified: Secondary | ICD-10-CM | POA: Diagnosis present

## 2014-03-26 DIAGNOSIS — E876 Hypokalemia: Secondary | ICD-10-CM | POA: Diagnosis present

## 2014-03-26 DIAGNOSIS — I4892 Unspecified atrial flutter: Secondary | ICD-10-CM

## 2014-03-26 DIAGNOSIS — I10 Essential (primary) hypertension: Secondary | ICD-10-CM | POA: Diagnosis present

## 2014-03-26 DIAGNOSIS — E440 Moderate protein-calorie malnutrition: Secondary | ICD-10-CM | POA: Diagnosis present

## 2014-03-26 DIAGNOSIS — E860 Dehydration: Secondary | ICD-10-CM | POA: Diagnosis present

## 2014-03-26 DIAGNOSIS — R339 Retention of urine, unspecified: Secondary | ICD-10-CM

## 2014-03-26 DIAGNOSIS — D62 Acute posthemorrhagic anemia: Secondary | ICD-10-CM | POA: Diagnosis present

## 2014-03-26 DIAGNOSIS — K219 Gastro-esophageal reflux disease without esophagitis: Secondary | ICD-10-CM | POA: Diagnosis present

## 2014-03-26 DIAGNOSIS — G9340 Encephalopathy, unspecified: Secondary | ICD-10-CM | POA: Diagnosis present

## 2014-03-26 DIAGNOSIS — L8992 Pressure ulcer of unspecified site, stage 2: Secondary | ICD-10-CM | POA: Diagnosis present

## 2014-03-26 DIAGNOSIS — G9341 Metabolic encephalopathy: Secondary | ICD-10-CM

## 2014-03-26 DIAGNOSIS — L89309 Pressure ulcer of unspecified buttock, unspecified stage: Secondary | ICD-10-CM | POA: Diagnosis present

## 2014-03-26 DIAGNOSIS — N179 Acute kidney failure, unspecified: Secondary | ICD-10-CM | POA: Diagnosis present

## 2014-03-26 DIAGNOSIS — E119 Type 2 diabetes mellitus without complications: Secondary | ICD-10-CM | POA: Diagnosis present

## 2014-03-26 DIAGNOSIS — L89609 Pressure ulcer of unspecified heel, unspecified stage: Secondary | ICD-10-CM | POA: Diagnosis present

## 2014-03-26 DIAGNOSIS — G8220 Paraplegia, unspecified: Secondary | ICD-10-CM

## 2014-03-26 DIAGNOSIS — R9431 Abnormal electrocardiogram [ECG] [EKG]: Secondary | ICD-10-CM | POA: Diagnosis present

## 2014-03-26 LAB — CBC WITH DIFFERENTIAL/PLATELET
Basophils Absolute: 0.1 10*3/uL (ref 0.0–0.1)
Basophils Relative: 1 % (ref 0–1)
EOS ABS: 0.9 10*3/uL — AB (ref 0.0–0.7)
Eosinophils Relative: 7 % — ABNORMAL HIGH (ref 0–5)
HCT: 38.6 % (ref 36.0–46.0)
Hemoglobin: 12.9 g/dL (ref 12.0–15.0)
LYMPHS ABS: 2.9 10*3/uL (ref 0.7–4.0)
Lymphocytes Relative: 24 % (ref 12–46)
MCH: 27.7 pg (ref 26.0–34.0)
MCHC: 33.4 g/dL (ref 30.0–36.0)
MCV: 83 fL (ref 78.0–100.0)
MONO ABS: 1.1 10*3/uL — AB (ref 0.1–1.0)
Monocytes Relative: 9 % (ref 3–12)
NEUTROS PCT: 59 % (ref 43–77)
Neutro Abs: 7.2 10*3/uL (ref 1.7–7.7)
PLATELETS: 496 10*3/uL — AB (ref 150–400)
RBC: 4.65 MIL/uL (ref 3.87–5.11)
RDW: 13.2 % (ref 11.5–15.5)
WBC MORPHOLOGY: INCREASED
WBC: 12.2 10*3/uL — ABNORMAL HIGH (ref 4.0–10.5)

## 2014-03-26 LAB — I-STAT CHEM 8, ED
BUN: 33 mg/dL — ABNORMAL HIGH (ref 6–23)
Calcium, Ion: 1.05 mmol/L — ABNORMAL LOW (ref 1.13–1.30)
Chloride: 97 mEq/L (ref 96–112)
Creatinine, Ser: 1.2 mg/dL — ABNORMAL HIGH (ref 0.50–1.10)
Glucose, Bld: 153 mg/dL — ABNORMAL HIGH (ref 70–99)
HCT: 42 % (ref 36.0–46.0)
HEMOGLOBIN: 14.3 g/dL (ref 12.0–15.0)
POTASSIUM: 3.3 meq/L — AB (ref 3.7–5.3)
Sodium: 135 mEq/L — ABNORMAL LOW (ref 137–147)
TCO2: 28 mmol/L (ref 0–100)

## 2014-03-26 LAB — URINALYSIS, ROUTINE W REFLEX MICROSCOPIC
BILIRUBIN URINE: NEGATIVE
Glucose, UA: NEGATIVE mg/dL
Hgb urine dipstick: NEGATIVE
Ketones, ur: NEGATIVE mg/dL
Leukocytes, UA: NEGATIVE
NITRITE: NEGATIVE
PROTEIN: NEGATIVE mg/dL
Specific Gravity, Urine: 1.009 (ref 1.005–1.030)
UROBILINOGEN UA: 0.2 mg/dL (ref 0.0–1.0)
pH: 7 (ref 5.0–8.0)

## 2014-03-26 LAB — GLUCOSE, CAPILLARY: GLUCOSE-CAPILLARY: 243 mg/dL — AB (ref 70–99)

## 2014-03-26 LAB — MAGNESIUM: Magnesium: 1.9 mg/dL (ref 1.5–2.5)

## 2014-03-26 LAB — PHOSPHORUS: Phosphorus: 3.2 mg/dL (ref 2.3–4.6)

## 2014-03-26 LAB — I-STAT CG4 LACTIC ACID, ED: LACTIC ACID, VENOUS: 3.78 mmol/L — AB (ref 0.5–2.2)

## 2014-03-26 LAB — MRSA PCR SCREENING: MRSA BY PCR: INVALID — AB

## 2014-03-26 MED ORDER — TRAZODONE HCL 50 MG PO TABS
50.0000 mg | ORAL_TABLET | Freq: Every day | ORAL | Status: DC
  Administered 2014-03-26 – 2014-03-30 (×5): 50 mg via ORAL
  Filled 2014-03-26 (×6): qty 1

## 2014-03-26 MED ORDER — ZOLPIDEM TARTRATE 5 MG PO TABS: 5.0000 mg | ORAL_TABLET | Freq: Every evening | ORAL | Status: DC | PRN

## 2014-03-26 MED ORDER — PIPERACILLIN-TAZOBACTAM 3.375 G IVPB
3.3750 g | Freq: Three times a day (TID) | INTRAVENOUS | Status: DC
  Administered 2014-03-26 – 2014-03-29 (×9): 3.375 g via INTRAVENOUS
  Filled 2014-03-26 (×8): qty 50

## 2014-03-26 MED ORDER — LORAZEPAM 1 MG PO TABS: 1.0000 mg | ORAL_TABLET | Freq: Two times a day (BID) | ORAL | Status: DC | PRN

## 2014-03-26 MED ORDER — GABAPENTIN 300 MG PO CAPS
300.0000 mg | ORAL_CAPSULE | Freq: Three times a day (TID) | ORAL | Status: DC
  Administered 2014-03-26 – 2014-03-31 (×15): 300 mg via ORAL
  Filled 2014-03-26 (×17): qty 1

## 2014-03-26 MED ORDER — ENOXAPARIN SODIUM 40 MG/0.4ML ~~LOC~~ SOLN
40.0000 mg | SUBCUTANEOUS | Status: DC
  Administered 2014-03-26 – 2014-03-30 (×5): 40 mg via SUBCUTANEOUS
  Filled 2014-03-26 (×6): qty 0.4

## 2014-03-26 MED ORDER — MAGNESIUM OXIDE 400 (241.3 MG) MG PO TABS
400.0000 mg | ORAL_TABLET | Freq: Two times a day (BID) | ORAL | Status: DC
  Administered 2014-03-26 – 2014-03-29 (×7): 400 mg via ORAL
  Filled 2014-03-26 (×10): qty 1

## 2014-03-26 MED ORDER — EZETIMIBE 10 MG PO TABS
10.0000 mg | ORAL_TABLET | Freq: Every morning | ORAL | Status: DC
  Administered 2014-03-27 – 2014-03-31 (×5): 10 mg via ORAL
  Filled 2014-03-26 (×5): qty 1

## 2014-03-26 MED ORDER — SPIRONOLACTONE 25 MG PO TABS
25.0000 mg | ORAL_TABLET | Freq: Two times a day (BID) | ORAL | Status: DC
  Administered 2014-03-26: 25 mg via ORAL
  Filled 2014-03-26: qty 1

## 2014-03-26 MED ORDER — VANCOMYCIN HCL IN DEXTROSE 750-5 MG/150ML-% IV SOLN
750.0000 mg | Freq: Two times a day (BID) | INTRAVENOUS | Status: DC
  Filled 2014-03-26: qty 150

## 2014-03-26 MED ORDER — BACLOFEN 10 MG PO TABS
10.0000 mg | ORAL_TABLET | Freq: Three times a day (TID) | ORAL | Status: DC
  Administered 2014-03-26 – 2014-03-31 (×14): 10 mg via ORAL
  Filled 2014-03-26 (×17): qty 1

## 2014-03-26 MED ORDER — SODIUM CHLORIDE 0.9 % IV SOLN
INTRAVENOUS | Status: AC
  Administered 2014-03-26: 50 mL/h via INTRAVENOUS

## 2014-03-26 MED ORDER — OXYCODONE HCL ER 40 MG PO T12A
40.0000 mg | EXTENDED_RELEASE_TABLET | Freq: Two times a day (BID) | ORAL | Status: DC
  Administered 2014-03-26 – 2014-03-31 (×10): 40 mg via ORAL
  Filled 2014-03-26: qty 1
  Filled 2014-03-26 (×2): qty 2
  Filled 2014-03-26 (×2): qty 1
  Filled 2014-03-26 (×3): qty 2
  Filled 2014-03-26: qty 1
  Filled 2014-03-26: qty 2

## 2014-03-26 MED ORDER — SODIUM CHLORIDE 0.9 % IV BOLUS (SEPSIS)
1000.0000 mL | Freq: Once | INTRAVENOUS | Status: AC
  Administered 2014-03-26: 1000 mL via INTRAVENOUS

## 2014-03-26 MED ORDER — TAMSULOSIN HCL 0.4 MG PO CAPS
0.4000 mg | ORAL_CAPSULE | Freq: Every morning | ORAL | Status: DC
  Administered 2014-03-27 – 2014-03-31 (×5): 0.4 mg via ORAL
  Filled 2014-03-26 (×5): qty 1

## 2014-03-26 MED ORDER — PANTOPRAZOLE SODIUM 40 MG PO TBEC
40.0000 mg | DELAYED_RELEASE_TABLET | Freq: Every day | ORAL | Status: DC
  Administered 2014-03-27 – 2014-03-31 (×5): 40 mg via ORAL
  Filled 2014-03-26 (×5): qty 1

## 2014-03-26 MED ORDER — VANCOMYCIN HCL 1000 MG IV SOLR
750.0000 mg | Freq: Two times a day (BID) | INTRAVENOUS | Status: DC
  Administered 2014-03-27 – 2014-03-28 (×3): 750 mg via INTRAVENOUS
  Filled 2014-03-26 (×4): qty 750

## 2014-03-26 MED ORDER — VORTIOXETINE HBR 10 MG PO TABS
10.0000 mg | ORAL_TABLET | Freq: Every day | ORAL | Status: DC
  Administered 2014-03-27 – 2014-03-31 (×5): 10 mg via ORAL
  Filled 2014-03-26 (×5): qty 1

## 2014-03-26 MED ORDER — ENSURE PUDDING PO PUDG
1.0000 | Freq: Two times a day (BID) | ORAL | Status: DC
  Administered 2014-03-27 – 2014-03-31 (×7): 1 via ORAL
  Filled 2014-03-26 (×10): qty 1

## 2014-03-26 MED ORDER — VANCOMYCIN HCL IN DEXTROSE 1-5 GM/200ML-% IV SOLN
1000.0000 mg | Freq: Once | INTRAVENOUS | Status: AC
  Administered 2014-03-26: 1000 mg via INTRAVENOUS
  Filled 2014-03-26: qty 200

## 2014-03-26 MED ORDER — ASPIRIN EC 81 MG PO TBEC
81.0000 mg | DELAYED_RELEASE_TABLET | Freq: Every morning | ORAL | Status: DC
  Administered 2014-03-27 – 2014-03-31 (×5): 81 mg via ORAL
  Filled 2014-03-26 (×5): qty 1

## 2014-03-26 MED ORDER — TRIFLUOPERAZINE HCL 5 MG PO TABS
5.0000 mg | ORAL_TABLET | Freq: Every morning | ORAL | Status: DC
  Administered 2014-03-27 – 2014-03-31 (×5): 5 mg via ORAL
  Filled 2014-03-26 (×5): qty 1

## 2014-03-26 MED ORDER — PROMETHAZINE HCL 25 MG PO TABS: 25.0000 mg | ORAL_TABLET | Freq: Four times a day (QID) | ORAL | Status: DC | PRN

## 2014-03-26 MED ORDER — INSULIN ASPART 100 UNIT/ML ~~LOC~~ SOLN
0.0000 [IU] | Freq: Three times a day (TID) | SUBCUTANEOUS | Status: DC
  Administered 2014-03-27: 1 [IU] via SUBCUTANEOUS
  Administered 2014-03-27: 2 [IU] via SUBCUTANEOUS
  Administered 2014-03-28: 1 [IU] via SUBCUTANEOUS
  Administered 2014-03-28 – 2014-03-29 (×2): 2 [IU] via SUBCUTANEOUS
  Administered 2014-03-29 – 2014-03-30 (×2): 1 [IU] via SUBCUTANEOUS

## 2014-03-26 MED ORDER — POTASSIUM CHLORIDE CRYS ER 20 MEQ PO TBCR
20.0000 meq | EXTENDED_RELEASE_TABLET | Freq: Three times a day (TID) | ORAL | Status: DC
  Administered 2014-03-26 – 2014-03-28 (×8): 20 meq via ORAL
  Filled 2014-03-26 (×7): qty 1

## 2014-03-26 NOTE — Progress Notes (Addendum)
Pt had approx 2 minute run of Vtach on the monitor. Pt remained A/O and all other VSS. EKG performed. Paged Dr. Izola Price. No orders received. Will continue to monitor.

## 2014-03-26 NOTE — H&P (Signed)
[facility] History and Physical  [name] [name] ZOX:[id number] DOB: 1946/03/19 DOA: 03/26/2014  Referring physician: ED physician PCP: [name], MD   Chief Complaint: altered mental status   HPI:  68 year old female with paraplegia following spinal cord injury, hypertension, recently hospitalized from 9/7 - 9/11 for sepsis secondary to Klebsiella, no presented to [facility] ED with altered mental status and progressive failure to thrive. Pt is somewhat lethargic on exam and unable to provide much history but saying she wants to go home as she is not sure why is she in the hospital. She currently denies any specific symptoms such as shortness of breath or chest pain, no reported abd or urinary concerns. Please note that no family is available at bedside for fuller history and most of the details obtained from available records.   In ED, pt noted to be somewhat lethargic, BP soft 86/30, HR 50 - 60's. Blood work notable for WBC 12.2, K 3.3, Cr 1.2. No acute findings on abd/chest XRAY. TRH asked to admit for further evaluation.   Assessment and Plan: Active Problems: Sepsis - pt satisfies criteria for sepsis given hypotension, leukocytosis, elevate lactic acid - source is unclear, last admission it was determined that Klebsiella from urinary source caused bacteremia - will admit to SDU - place on Vanc and Zosyn, continue IVF - urine and blood culture requested  Acute encephalopathy - secondary to principal problem, dehydration, FTT - IVF and ABX as noted above - PT/OT evaluation once pt more medically stable  V-tac  - non sustained - pt asymptomatic - monitor on tele  Acute renal failure - provide IVF and repeat BMP in AM - hold Torsemide  Hypokalemia - mild, will supplement and repeat BMP in AM DM with complications of neuropathy - hold metformin and place on SSI for now Hypotension - hold torsemide, spironolactone, Metolazone  Decubitus ulcer heals - wound care  consult  Radiological Exams on Admission: Dg Abd Acute W/chest  03/26/2014  IMPRESSION: No acute abnormality.  Mildly prominent stool.     Code Status: Full Family Communication: Pt at bedside Disposition Plan: Admit for further evaluation    Review of Systems:  Unable to obtain due to lethargy     Past Medical History  Diagnosis Date  . GERD (gastroesophageal reflux disease)   . Quadriplegia   . Hypertension   . Pressure ulcer of foot   . Chronic pain disorder   . Hyperlipidemia     Past Surgical History  Procedure Laterality Date  . Back surgery  08/2013  . Abdominal hysterectomy      Social History:  reports that she has quit smoking. Her smoking use included Cigarettes. She smoked 0.25 packs per day. She has never used smokeless tobacco. She reports that she does not drink alcohol or use illicit drugs.  Allergies  Allergen Reactions  . Decadrol [Dexamethasone] Swelling  . Decadron [Dexamethasone] Swelling  . Sodium Phosphate Swelling    Family History  Problem Relation Age of Onset  . Adopted: Yes    Prior to Admission medications   Medication Sig Start Date End Date Taking? Authorizing Provider  aspirin EC 81 MG tablet Take 81 mg by mouth every morning.    Yes Historical Provider, MD  baclofen (LIORESAL) 10 MG tablet Take 10 mg by mouth 3 (three) times daily.   Yes Historical Provider, MD  Choline Fenofibrate (TRILIPIX) 135 MG capsule Take 135 mg by mouth every morning.    Yes Historical Provider, MD  ciprofloxacin (CIPRO)  500 MG tablet Take 1 tablet (500 mg total) by mouth 2 (two) times daily. 03/17/14 03/26/14 Yes Nishant Dhungel, MD  ezetimibe (ZETIA) 10 MG tablet Take 10 mg by mouth every morning.    Yes Historical Provider, MD  feeding supplement, ENSURE, (ENSURE) PUDG Take 1 Container by mouth 2 (two) times daily between meals. 03/17/14  Yes Nishant Dhungel, MD  gabapentin (NEURONTIN) 300 MG capsule Take 300 mg by mouth 3 (three) times daily.   Yes Historical  Provider, MD  LORazepam (ATIVAN) 1 MG tablet Take 1 tablet (1 mg total) by mouth every 12 (twelve) hours as needed for anxiety. 03/17/14  Yes Nishant Dhungel, MD  magnesium oxide (MAG-OX) 400 MG tablet Take 400 mg by mouth 2 (two) times daily.   Yes Historical Provider, MD  metolazone (ZAROXOLYN) 2.5 MG tablet Take 2.5 mg by mouth every morning.    Yes Historical Provider, MD  Multiple Vitamin (MULTIVITAMIN WITH MINERALS) TABS tablet Take 1 tablet by mouth every morning.    Yes Historical Provider, MD  omeprazole (PRILOSEC) 20 MG capsule Take 20 mg by mouth every morning.    Yes Historical Provider, MD  OxyCODONE (OXYCONTIN) 20 mg T12A 12 hr tablet Take 40 mg by mouth 2 (two) times daily.    Yes Historical Provider, MD  polyethylene glycol (MIRALAX / GLYCOLAX) packet Take 17 g by mouth daily as needed for mild constipation.   Yes Historical Provider, MD  potassium chloride SA (K-DUR,KLOR-CON) 20 MEQ tablet Take 20 mEq by mouth 3 (three) times daily.   Yes Historical Provider, MD  promethazine (PHENERGAN) 25 MG tablet Take 25 mg by mouth every 6 (six) hours as needed for nausea or vomiting.   Yes Historical Provider, MD  sodium phosphate (FLEET) enema Place 1 enema rectally daily as needed (constipation). follow package directions   Yes Historical Provider, MD  spironolactone (ALDACTONE) 25 MG tablet Take 25 mg by mouth 2 (two) times daily.   Yes Historical Provider, MD  tamsulosin (FLOMAX) 0.4 MG CAPS capsule Take 0.4 mg by mouth every morning.    Yes Historical Provider, MD  torsemide (DEMADEX) 20 MG tablet Take 20 mg by mouth every morning.    Yes Historical Provider, MD  traZODone (DESYREL) 50 MG tablet Take 50 mg by mouth at bedtime.   Yes Historical Provider, MD  trifluoperazine (STELAZINE) 5 MG tablet Take 5 mg by mouth every morning.    Yes Historical Provider, MD  Vortioxetine HBr (BRINTELLIX) 10 MG TABS Take 10 mg by mouth 3 (three) times daily.    Yes Historical Provider, MD  zolpidem  (AMBIEN) 5 MG tablet Take 5 mg by mouth at bedtime as needed for sleep.   Yes Historical Provider, MD  metFORMIN (GLUCOPHAGE) 500 MG tablet Take 500 mg by mouth daily with breakfast.     Historical Provider, MD    Physical Exam: Filed Vitals:   03/26/14 1345 03/26/14 1407 03/26/14 1415 03/26/14 1430  BP: 99/42 100/47 110/42 90/38  Pulse: 59 59 57 59  Temp:      TempSrc:      Resp:      SpO2: 96% 98% 98% 96%    Physical Exam  Constitutional: Appears somnolent but easy to arouse  HENT: Normocephalic. External right and left ear normal. Dry MM Eyes: Conjunctivae and EOM are normal. PERRLA, no scleral icterus.  Neck: Normal ROM. Neck supple. No JVD. No tracheal deviation. No thyromegaly.  CVS: RRR, no gallops, no carotid bruit.  Pulmonary: Effort and  breath sounds normal, no stridor, diminished breath sounds at bases  Abdominal: Soft. BS +,  no distension, tenderness, rebound or guarding.  Musculoskeletal: Normal range of motion. No edema and no tenderness.  Lymphadenopathy: No lymphadenopathy noted, cervical, inguinal. Neuro: quadriplegic  Skin: Decubitus ulcer of the heel  Psychiatric: unable to assess due to lethargy   Labs on Admission:  Basic Metabolic Panel:  Recent Labs Lab 03/26/14 1238  NA 135*  K 3.3*  CL 97  GLUCOSE 153*  BUN 33*  CREATININE 1.20*   Liver Function Tests: No results found for this basename: AST, ALT, ALKPHOS, BILITOT, PROT, ALBUMIN,  in the last 168 hours No results found for this basename: LIPASE, AMYLASE,  in the last 168 hours No results found for this basename: AMMONIA,  in the last 168 hours CBC:  Recent Labs Lab 03/26/14 1225 03/26/14 1238  WBC 12.2*  --   NEUTROABS 7.2  --   HGB 12.9 14.3  HCT 38.6 42.0  MCV 83.0  --   PLT 496*  --     EKG: Normal sinus rhythm, no ST/T wave changes  Debbora Presto, MD  [facility] Pager [phone]  If 7PM-7AM, please contact night-coverage [email or web address] Password  [email or web address] 03/26/2014, 2:50 PM

## 2014-03-26 NOTE — Progress Notes (Signed)
ANTIBIOTIC CONSULT NOTE - INITIAL  Pharmacy Consult for vancomycin/zosyn Indication: sepsis  Allergies  Allergen Reactions  . Decadrol [Dexamethasone] Swelling  . Decadron [Dexamethasone] Swelling  . Sodium Phosphate Swelling    Patient Measurements:   Adjusted Body Weight:   Vital Signs: Temp: 98.6 F (37 C) (09/20 1200) Temp src: Rectal (09/20 1200) BP: 87/38 mmHg (09/20 1506) Pulse Rate: 57 (09/20 1506) Intake/Output from previous day:   Intake/Output from this shift:    Labs:  Recent Labs  03/26/14 1225 03/26/14 1238  WBC 12.2*  --   HGB 12.9 14.3  PLT 496*  --   CREATININE  --  1.20*   The CrCl is unknown because both a height and weight (above a minimum accepted value) are required for this calculation. No results found for this basename: VANCOTROUGH, Leodis Binet, VANCORANDOM, GENTTROUGH, GENTPEAK, GENTRANDOM, TOBRATROUGH, TOBRAPEAK, TOBRARND, AMIKACINPEAK, AMIKACINTROU, AMIKACIN,  in the last 72 hours   Microbiology: Recent Results (from the past 720 hour(s))  CULTURE, BLOOD (ROUTINE X 2)     Status: None   Collection Time    03/13/14  9:40 PM      Result Value Ref Range Status   Specimen Description BLOOD RIGHT HAND   Final   Special Requests BOTTLES DRAWN AEROBIC AND ANAEROBIC 4CC   Final   Culture  Setup Time     Final   Value: 03/14/2014 01:23     Performed at [facility]   Culture     Final   Value: KLEBSIELLA PNEUMONIAE     Note: SUSCEPTIBILITIES PERFORMED ON PREVIOUS CULTURE WITHIN THE LAST 5 DAYS.     Note: Gram Stain Report Called to,Read Back By and Verified With: DENNIS LITTLE 03/14/14 1152 BY SMITHERSJ     Performed at [facility]   Report Status 03/16/2014 FINAL   Final  CULTURE, BLOOD (ROUTINE X 2)     Status: None   Collection Time    03/13/14 10:10 PM      Result Value Ref Range Status   Specimen Description BLOOD RIGHT WRIST   Final   Special Requests BOTTLES DRAWN AEROBIC AND ANAEROBIC 1CC   Final   Culture  Setup  Time     Final   Value: 03/14/2014 01:22     Performed at [facility]   Culture     Final   Value: KLEBSIELLA PNEUMONIAE     Note: Gram Stain Report Called to,Read Back By and Verified With: GRACE SISON 03/14/14 1320 BY SMITHERSJ     Performed at [facility]   Report Status 03/16/2014 FINAL   Final   Organism ID, Bacteria KLEBSIELLA PNEUMONIAE   Final  MRSA PCR SCREENING     Status: Abnormal   Collection Time    03/14/14  3:17 AM      Result Value Ref Range Status   MRSA by PCR POSITIVE (*) NEGATIVE Final   Comment:            The GeneXpert MRSA Assay (FDA     approved for NASAL specimens     only), is one component of a     comprehensive MRSA colonization     surveillance program. It is not     intended to diagnose MRSA     infection nor to guide or     monitor treatment for     MRSA infections.     RESULT CALLED TO, READ BACK BY AND VERIFIED WITH:     B.  DAVIS RN AT 0500 ON 09.08.15 BY SHUEA    Medical History: Past Medical History  Diagnosis Date  . GERD (gastroesophageal reflux disease)   . Quadriplegia   . Hypertension   . Pressure ulcer of foot   . Chronic pain disorder   . Hyperlipidemia    Assessment: 68 YOF sent to ED for possible UTI. Recently discharged 9/11 following klebsiella bacteremia (to complete 2 week course with cipro PO) orders to start vancomycin and zosyn for possible sepsis.   9/20 >>vancomycin  >> 9/20 >> zosyn >>    Tmax: WBCs: slightly elevated Renal: SCr = 1.2 for normalized CrCl = 14ml/min  9/7 blood: K. Pneumoniae (R only to amp) 9/20 blood: 9/20 urine:   Drug level / dose changes info:  9/20: D0 vanco 1gm x then  q12h/ZEI for sepsis, recent Gram neg bacteremia  Goal of Therapy:  Vancomycin trough level 15-20 mcg/ml  Plan:   Vancomycin  x 1 then  IV q12h  Follow-up possible source of infection and adjust goal trough as indicated  Patient is quadriplegia so SCr likely overestimating renal  function, low threshold to change frequency if Scr does not improve with IVF  Trough at steady state  Zosyn 3.375gm IV q8h over 4h infusion  Follow cultures and de-escalate as appropriate  Juliette Alcide, PharmD, BCPS.   Pager: [phone]  03/26/2014,3:37 PM

## 2014-03-26 NOTE — ED Notes (Signed)
Pt is from home and was sent to ED due to possible UTI. Family/caretaker reports fowl smelling urine. Pt is lethargic per EMS, however is A/O and at baseline. Pt is quadriplegic.

## 2014-03-26 NOTE — ED Notes (Signed)
Elevated lactic given to Dr Rubin Payor.klj

## 2014-03-27 ENCOUNTER — Ambulatory Visit: Payer: Self-pay | Admitting: Physical Therapy

## 2014-03-27 ENCOUNTER — Encounter (HOSPITAL_COMMUNITY): Payer: Self-pay

## 2014-03-27 LAB — GLUCOSE, CAPILLARY
GLUCOSE-CAPILLARY: 117 mg/dL — AB (ref 70–99)
GLUCOSE-CAPILLARY: 149 mg/dL — AB (ref 70–99)
Glucose-Capillary: 167 mg/dL — ABNORMAL HIGH (ref 70–99)
Glucose-Capillary: 208 mg/dL — ABNORMAL HIGH (ref 70–99)

## 2014-03-27 LAB — BASIC METABOLIC PANEL
Anion gap: 14 (ref 5–15)
BUN: 26 mg/dL — ABNORMAL HIGH (ref 6–23)
CALCIUM: 8.9 mg/dL (ref 8.4–10.5)
CHLORIDE: 94 meq/L — AB (ref 96–112)
CO2: 29 meq/L (ref 19–32)
CREATININE: 0.78 mg/dL (ref 0.50–1.10)
GFR calc Af Amer: >90 mL/min (ref >90)
GFR calc non Af Amer: 83 mL/min — ABNORMAL LOW (ref >90)
GLUCOSE: 105 mg/dL — AB (ref 70–99)
Potassium: 3.8 mEq/L (ref 3.7–5.3)
Sodium: 137 mEq/L (ref 137–147)

## 2014-03-27 LAB — URINE CULTURE
CULTURE: NO GROWTH
Colony Count: NO GROWTH

## 2014-03-27 LAB — CBC
HEMATOCRIT: 36.5 % (ref 36.0–46.0)
HEMOGLOBIN: 11.9 g/dL — AB (ref 12.0–15.0)
MCH: 27.5 pg (ref 26.0–34.0)
MCHC: 32.6 g/dL (ref 30.0–36.0)
MCV: 84.5 fL (ref 78.0–100.0)
Platelets: 453 10*3/uL — ABNORMAL HIGH (ref 150–400)
RBC: 4.32 MIL/uL (ref 3.87–5.11)
RDW: 13.1 % (ref 11.5–15.5)
WBC: 11.4 10*3/uL — ABNORMAL HIGH (ref 4.0–10.5)

## 2014-03-27 LAB — TSH: TSH: 2.97 u[IU]/mL (ref 0.350–4.500)

## 2014-03-27 MED ORDER — CETYLPYRIDINIUM CHLORIDE 0.05 % MT LIQD
7.0000 mL | Freq: Two times a day (BID) | OROMUCOSAL | Status: DC
  Administered 2014-03-27 – 2014-03-31 (×10): 7 mL via OROMUCOSAL

## 2014-03-27 MED ORDER — SODIUM CHLORIDE 0.9 % IV SOLN: INTRAVENOUS | Status: DC

## 2014-03-27 MED ORDER — BOOST PLUS PO LIQD
237.0000 mL | Freq: Two times a day (BID) | ORAL | Status: DC
  Administered 2014-03-27 – 2014-03-31 (×5): 237 mL via ORAL
  Filled 2014-03-27 (×9): qty 237

## 2014-03-27 MED ORDER — SODIUM CHLORIDE 0.9 % IV SOLN: INTRAVENOUS | Status: AC

## 2014-03-27 MED ORDER — ERYTHROMYCIN 5 MG/GM OP OINT
TOPICAL_OINTMENT | Freq: Three times a day (TID) | OPHTHALMIC | Status: DC
  Administered 2014-03-27 – 2014-03-28 (×3): 1 via OPHTHALMIC
  Administered 2014-03-28: 14:00:00 via OPHTHALMIC
  Administered 2014-03-28 – 2014-03-29 (×2): 1 via OPHTHALMIC
  Administered 2014-03-29 – 2014-03-31 (×6): via OPHTHALMIC
  Filled 2014-03-27: qty 3.5

## 2014-03-27 MED ORDER — SODIUM CHLORIDE 0.9 % IV BOLUS (SEPSIS)
500.0000 mL | Freq: Once | INTRAVENOUS | Status: AC
  Administered 2014-03-27: 500 mL via INTRAVENOUS

## 2014-03-27 NOTE — Progress Notes (Signed)
Patient ID: [name] [name], female   DOB: 1946-06-20, 68 y.o.   MRN: [id number]  [facility] PROGRESS NOTE  [name] [name] WUJ:[id number] DOB: [birth year] DOA: 03/26/2014 PCP: [name] Keens, MD  Brief narrative: 68 year old female with paraplegia following spinal cord injury, hypertension, recently hospitalized from 9/7 - 9/11 for sepsis secondary to Klebsiella, no presented to [facility] ED with altered mental status and progressive failure to thrive. Pt is somewhat lethargic on exam and unable to provide much history but saying she wants to go home as she is not sure why is she in the hospital. She currently denies any specific symptoms such as shortness of breath or chest pain, no reported abd or urinary concerns. Please note that no family is available at bedside for fuller history and most of the details obtained from available records.   In ED, pt noted to be somewhat lethargic, BP soft 86/30, HR 50 - 60's. Blood work notable for WBC 12.2, K 3.3, Cr 1.2. No acute findings on abd/chest XRAY. TRH asked to admit for further evaluation.   Assessment and Plan:    Assessment and Plan:  Active Problems:  Sepsis  - pt satisfies criteria for sepsis given hypotension, leukocytosis, elevate lactic acid  - source is unclear, last admission it was determined that Klebsiella from urinary source caused bacteremia  - pt is better this AM, more alert and follows commands appropriately  - continue Vancomycin and Zosyn day #2 - urine and blood culture requested, negative to date   - WBC trending down: 12.2 --> 11.4  Acute encephalopathy  - secondary to principal problem, dehydration, FTT - clinically stable this am and appears to be at baseline mental state of functioning  - IVF and ABX as noted above  - PT/OT evaluation once pt more medically stable  V-tac at the time of the admission 9/20 - non sustained  - pt asymptomatic  - continue to monitor on tele  Acute renal failure  - determined to be pre  renal from dehydration - continue to hold Torsemide - continue IVF as Cr is continuing to trend down - gentle hydration to avoid volume overload  Acute on chronic blood loss anemia - drop in Hg likely dilutional - no signs of active bleeding - repeat CBC in AM Paraplegia, from spinal cord injury  - bed bound Hypokalemia  - supplemented and WNL this AM DM with complications of neuropathy  - continue to hold Metformin  - Continue SSI Hypotension  - hold torsemide, spironolactone, Metolazone  - IVF as noted above and restart home meds as clinically indicated  Protein calorie malnutrition, moderate  - with chronic underlying medical conditions outlined above including paraplegia  - appreciate nutrition input  Decubitus ulcers, multiple sites - Healing Stage III left heel: 0.2cm x 0.3cm x 0 area with healing skin and small black scabbed area  - Stage II right buttock: 0.5cm x 0.5cm x 0.2cm  - Ulceration right inner thigh: 1.0cm x 5.0cm x 0.2cm  - Dressing rec's: Soft silicone foam dressings to protect and insulate the wounds, float heels at all times, air matress  - appreciate wound care team assistance   DVT prophylaxis  Lovenox SQ while pt is in hospital  Code Status: Full Family Communication: Pt at bedside Disposition Plan: Remains in SDU, possibly to tele bed in AM   IV Access:   Peripheral IV Procedures and diagnostic studies:   Dg Abd Acute W/chest 03/26/2014   No acute abnormality.  Mildly prominent stool.  Medical Consultants:   Wound care team  Other Consultants:   Physical therapy, OT Anti-Infectives:   Vancomycin 9/20 --> Zosyn 9/20 -->  Debbora Presto, MD  [facility] Pager [phone]  If 7PM-7AM, please contact night-coverage [email or web address] Password [facility] 03/27/2014, 4:32 PM   LOS: 1 day   HPI/Subjective: No events overnight.   Objective: Filed Vitals:   03/27/14 0900 03/27/14 1000 03/27/14 1200 03/27/14 1400  BP: 108/28 92/29 112/46 121/101  Pulse:  58 71 74 76  Temp: 98.4 F (36.9 C) 98.2 F (36.8 C) 98.8 F (37.1 C) 99 F (37.2 C)  TempSrc:      Resp: Height:      Weight:      SpO2: 99% 100% 99% 96%    Intake/Output Summary (Last 24 hours) at 03/27/14 1632 Last data filed at 03/27/14 1500  Gross per 24 hour  Intake 2529.17 ml  Output   2650 ml  Net -120.83 ml    Exam:   General:  Pt is alert, follows commands appropriately, not in acute distress  Cardiovascular: Regular rate and rhythm, S1/S2, no murmurs, no rubs, no gallops  Respiratory: Clear to auscultation bilaterally, no wheezing, no crackles, no rhonchi  Abdomen: Soft, non tender, non distended, bowel sounds present, no guarding  Extremities: No edema, pulses DP and PT palpable bilaterally  Skin: Stage III left heel: 0.2cm x 0.3cm x 0 area, stage II right buttock: 0.5cm x 0.5cm x 0.2cm, Ulceration right inner thigh: 1.0cm x 5.0cm x 0.2cm   Data Reviewed: Basic Metabolic Panel:  Recent Labs Lab 03/26/14 1225 03/26/14 1238 03/27/14 0340  NA  --  135* 137  K  --  3.3* 3.8  CL  --  97 94*  CO2  --   --  29  GLUCOSE  --  153* 105*  BUN  --  33* 26*  CREATININE  --  1.20* 0.78  CALCIUM  --   --  8.9  MG 1.9  --   --   PHOS 3.2  --   --    CBC:  Recent Labs Lab 03/26/14 1225 03/26/14 1238 03/27/14 0340  WBC 12.2*  --  11.4*  NEUTROABS 7.2  --   --   HGB 12.9 14.3 11.9*  HCT 38.6 42.0 36.5  MCV 83.0  --  84.5  PLT 496*  --  453*   CBG:  Recent Labs Lab 03/26/14 2123 03/27/14 0744 03/27/14 1214  GLUCAP 243* 117* 149*    Recent Results (from the past 240 hour(s))  URINE CULTURE     Status: None   Collection Time    03/26/14 12:12 PM      Result Value Ref Range Status   Specimen Description URINE, CATHETERIZED   Final   Special Requests NONE   Final   Culture  Setup Time     Final   Value: 03/26/2014 19:11     Performed at [facility] Count     Final   Value: NO GROWTH     Performed at [facility]   Culture     Final   Value: NO GROWTH     Performed at [facility]   Report Status 03/27/2014 FINAL   Final  CULTURE, BLOOD (ROUTINE X 2)     Status: None   Collection Time    03/26/14 12:25 PM      Result Value Ref Range Status   Specimen Description  BLOOD BLOOD LEFT FOREARM   Final   Special Requests BOTTLES DRAWN AEROBIC AND ANAEROBIC   Final   Culture  Setup Time     Final   Value: 03/26/2014 18:13     Performed at [facility]   Culture     Final   Value:        BLOOD CULTURE RECEIVED NO GROWTH TO DATE CULTURE WILL BE HELD FOR 5 DAYS BEFORE ISSUING A FINAL NEGATIVE REPORT     Performed at [facility]   Report Status PENDING   Incomplete  CULTURE, BLOOD (ROUTINE X 2)     Status: None   Collection Time    03/26/14 12:25 PM      Result Value Ref Range Status   Specimen Description BLOOD LEFT ANTECUBITAL   Final   Special Requests BOTTLES DRAWN AEROBIC AND ANAEROBIC   Final   Culture  Setup Time     Final   Value: 03/26/2014 18:13     Performed at [facility]   Culture     Final   Value:        BLOOD CULTURE RECEIVED NO GROWTH TO DATE CULTURE WILL BE HELD FOR 5 DAYS BEFORE ISSUING A FINAL NEGATIVE REPORT     Performed at [facility]   Report Status PENDING   Incomplete  MRSA PCR SCREENING     Status: Abnormal   Collection Time    03/26/14  3:56 PM      Result Value Ref Range Status   MRSA by PCR INVALID RESULTS, SPECIMEN SENT FOR CULTURE (*) NEGATIVE Final   Comment: SPOKE WITH DENNY,S RN 1940 [phone] COV[place],N                The GeneXpert MRSA Assay (FDA     approved for NASAL specimens     only), is one component of a     comprehensive MRSA colonization     surveillance program. It is not     intended to diagnose MRSA     infection nor to guide or     monitor treatment for     MRSA infections.     Scheduled Meds: . aspirin EC  81 mg Oral q morning - 10a  . baclofen  10 mg Oral TID  . enoxaparin   injection  40 mg Subcutaneous Q24H  . erythromycin   Both Eyes 3 times per day  . ezetimibe  10 mg Oral q morning - 10a  . gabapentin  300 mg Oral TID  . insulin aspart  0-9 Units Subcutaneous TID WC  . magnesium oxide  400 mg Oral BID  . OxyCODONE  40 mg Oral BID  . pantoprazole  40 mg Oral Daily  . ZOSYN  IV  3.375 g Intravenous 3 times per day  . potassium chloride SA  20 mEq Oral TID  . tamsulosin  0.4 mg Oral q morning - 10a  . traZODone  50 mg Oral QHS  . trifluoperazine  5 mg Oral q morning - 10a  . vancomycin 750 mg  750 mg Intravenous Q12H  . Vortioxetine HBr  10 mg Oral Daily   Continuous Infusions: . sodium chloride

## 2014-03-27 NOTE — Progress Notes (Signed)
INITIAL NUTRITION ASSESSMENT  DOCUMENTATION CODES Per approved criteria  -Not Applicable   INTERVENTION: - Boost Plus BID - RD to continue to monitor   NUTRITION DIAGNOSIS: Increased nutrient needs related to pressure ulcers as evidenced by WOC RN notes.   Goal: Pt to consume >90% of meals/supplements  Monitor:  Weights, labs, intake  Reason for Assessment: Low braden  68 y.o. female  Admitting Dx: Altered mental status   ASSESSMENT: Pt with paraplegia following spinal cord injury, hypertension, recently hospitalized from 9/7 - 9/11 for sepsis secondary to Klebsiella, presented to [facility] ED with altered mental status and progressive failure to thrive. Being followed by [facility] RN for stage III left heel and stage II right buttocks pressure ulcers.   - Met with pt who reports good appetite at home - Eating 3 meals/day of well balanced meals and drinks 2 Boost per day - Denies any changes in weight - Pt reports eating 50% of her breakfast this morning   Height: Ht Readings from Last 1 Encounters:  03/26/14 '5\' 2"'  (1.575 m)    Weight: Wt Readings from Last 1 Encounters:  03/27/14 162 lb 14.7 oz (73.9 kg)    Ideal Body Weight: 105 lbs (adjusted for paraplegia)  % Ideal Body Weight: 154%  Wt Readings from Last 10 Encounters:  03/27/14 162 lb 14.7 oz (73.9 kg)  03/14/14 165 lb 2 oz (74.9 kg)  06/10/13 173 lb (78.472 kg)    Usual Body Weight: 165 lbs earlier this month  % Usual Body Weight: 98%  BMI:  Body mass index is 29.79 kg/(m^2).  Estimated Nutritional Needs: Kcal: 1500-1700  Protein: 75-90 gram  Fluid: >/=1500 ml/daily  Skin: Generalized edema, +1 RLE, LLE edema, stage III left heel and stage II right buttock pressure ulcer  Diet Order: General  EDUCATION NEEDS: -No education needs identified at this time   Intake/Output Summary (Last 24 hours) at 03/27/14 1124 Last data filed at 03/27/14 1100  Gross per 24 hour  Intake 1306.67 ml  Output   2300 ml   Net -993.33 ml    Last BM: 9/18  Labs:   Recent Labs Lab 03/26/14 1225 03/26/14 1238 03/27/14 0340  NA  --  135* 137  K  --  3.3* 3.8  CL  --  97 94*  CO2  --   --  29  BUN  --  33* 26*  CREATININE  --  1.20* 0.78  CALCIUM  --   --  8.9  MG 1.9  --   --   PHOS 3.2  --   --   GLUCOSE  --  153* 105*    CBG (last 3)   Recent Labs  03/26/14 2123  GLUCAP 243*    Scheduled Meds: . antiseptic oral rinse  7 mL Mouth Rinse BID  . aspirin EC  81 mg Oral q morning - 10a  . baclofen  10 mg Oral TID  . enoxaparin (LOVENOX) injection  40 mg Subcutaneous Q24H  . ezetimibe  10 mg Oral q morning - 10a  . feeding supplement (ENSURE)  1 Container Oral BID BM  . gabapentin  300 mg Oral TID  . insulin aspart  0-9 Units Subcutaneous TID WC  . magnesium oxide  400 mg Oral BID  . OxyCODONE  40 mg Oral BID  . pantoprazole  40 mg Oral Daily  . piperacillin-tazobactam (ZOSYN)  IV  3.375 g Intravenous 3 times per day  . potassium chloride SA  20 mEq  Oral TID  . tamsulosin  0.4 mg Oral q morning - 10a  . traZODone  50 mg Oral QHS  . trifluoperazine  5 mg Oral q morning - 10a  . vancomycin (VANCOCIN) 750 mg IVPB  750 mg Intravenous Q12H  . Vortioxetine HBr  10 mg Oral Daily    Continuous Infusions:   Past Medical History  Diagnosis Date  . GERD (gastroesophageal reflux disease)   . Quadriplegia   . Hypertension   . Pressure ulcer of foot   . Chronic pain disorder   . Hyperlipidemia     Past Surgical History  Procedure Laterality Date  . Back surgery  08/2013  . Abdominal hysterectomy      Carlis Stable MS, RD, LDN [phone] Pager [phone] Weekend/After Hours Pager

## 2014-03-27 NOTE — Consult Note (Signed)
WOC wound consult note Reason for Consult: wound care?   Pt with multiple ulcerations to be evaluated for treatment.  Pt paraplegic from home. She reports she has air mattress and wheelchair cushion at home.   Wound type: Healing Stage III left heel Stage II right buttock Ulceration right inner thigh: unclear etiology Pressure Ulcer POA: Yes x 2 Measurement: Left heel: 0.2cm x 0.3cm x 0 area with healing skin and small black scabbed area Right buttock: 0.5cm x 0.5cm x 0.2cm  Right inner thigh: 1.0cm x 5.0cm x 0.2cm  Wound bed: Left heel: scarring with mostly intact skin, pink, one small scabbed area  Right buttock: pink, partial thickness, moist Right inner thigh: pink, moist, linear ulceration initially I thought this to be related to pressure from the [facility] line or tubing but this patient did not present to the hospital with FC.  This may be moisture related or related to wearing a incontinence brief at home.  I  Drainage (amount, consistency, odor) minimal, serous from all wounds. No odor Periwound:intact, see doc flow sheets reference on the sacrum  Dressing procedure/placement/frequency: Soft silicone foam dressings will work well to protect and insulate the wounds.  Pt with severe foot drop for this reason I will not order Prevalon boots. Float heels at all times. Sport mattress in place while in the ICU. Would recommend air mattress at the time of transfer.   Discussed POC with patient and bedside nurse.  Re consult if needed, will not follow at this time. Thanks  Taneil Lazarus [employer], CWOCN [phone])

## 2014-03-28 LAB — BASIC METABOLIC PANEL
ANION GAP: 13 (ref 5–15)
BUN: 14 mg/dL (ref 6–23)
CHLORIDE: 95 meq/L — AB (ref 96–112)
CO2: 28 mEq/L (ref 19–32)
Calcium: 8.8 mg/dL (ref 8.4–10.5)
Creatinine, Ser: 0.5 mg/dL (ref 0.50–1.10)
Glucose, Bld: 121 mg/dL — ABNORMAL HIGH (ref 70–99)
POTASSIUM: 3.2 meq/L — AB (ref 3.7–5.3)
Sodium: 136 mEq/L — ABNORMAL LOW (ref 137–147)

## 2014-03-28 LAB — CBC
HEMATOCRIT: 32.5 % — AB (ref 36.0–46.0)
Hemoglobin: 11.1 g/dL — ABNORMAL LOW (ref 12.0–15.0)
MCH: 28 pg (ref 26.0–34.0)
MCHC: 34.2 g/dL (ref 30.0–36.0)
MCV: 82.1 fL (ref 78.0–100.0)
Platelets: 377 10*3/uL (ref 150–400)
RBC: 3.96 MIL/uL (ref 3.87–5.11)
RDW: 12.7 % (ref 11.5–15.5)
WBC: 10.2 10*3/uL (ref 4.0–10.5)

## 2014-03-28 LAB — GLUCOSE, CAPILLARY
GLUCOSE-CAPILLARY: 152 mg/dL — AB (ref 70–99)
GLUCOSE-CAPILLARY: 131 mg/dL — AB (ref 70–99)
Glucose-Capillary: 105 mg/dL — ABNORMAL HIGH (ref 70–99)

## 2014-03-28 LAB — VANCOMYCIN, TROUGH: Vancomycin Tr: 13.6 ug/mL (ref 10.0–20.0)

## 2014-03-28 MED ORDER — VANCOMYCIN HCL IN DEXTROSE 1-5 GM/200ML-% IV SOLN
1000.0000 mg | Freq: Two times a day (BID) | INTRAVENOUS | Status: DC
  Administered 2014-03-28: 1000 mg via INTRAVENOUS
  Filled 2014-03-28: qty 200

## 2014-03-28 MED ORDER — NICOTINE 21 MG/24HR TD PT24
21.0000 mg | MEDICATED_PATCH | Freq: Every day | TRANSDERMAL | Status: DC
  Filled 2014-03-28 (×2): qty 1

## 2014-03-28 MED ORDER — SODIUM CHLORIDE 0.9 % IV SOLN: INTRAVENOUS | Status: AC

## 2014-03-28 NOTE — Progress Notes (Signed)
ANTIBIOTIC CONSULT NOTE - Follow Up  Pharmacy Consult for vancomycin/zosyn Indication: sepsis  Allergies  Allergen Reactions  . Decadrol [Dexamethasone] Swelling  . Decadron [Dexamethasone] Swelling  . Sodium Phosphate Swelling    Patient Measurements: Height:  (157.5 cm) Weight: 167 lb 12.3 oz (76.1 kg) IBW/kg (Calculated) : 50.1  Vital Signs: Temp: 98.8 F (37.1 C) (09/22 1000) Temp src: Core (Comment) (09/22 1000) BP: 102/42 mmHg (09/22 0823) Pulse Rate: 64 (09/22 1000) Intake/Output from previous day: 09/21 0701 - 09/22 0700 In: 2607.5 [P.O.:1445; I.V.:675; IV Piggyback:487.5] Out: 1450 [Urine:1450] Intake/Output from this shift: Total I/O In: 240 [P.O.:240] Out: 200 [Urine:200]  Labs:  Recent Labs  03/26/14 1225 03/26/14 1238 03/27/14 0340 03/28/14 0335  WBC 12.2*  --  11.4* 10.2  HGB 12.9 14.3 11.9* 11.1*  PLT 496*  --  453* 377  CREATININE  --  1.20* 0.78 0.50   Estimated Creatinine Clearance: 63.4 ml/min (by C-G formula based on Cr of 0.5). No results found for this basename: VANCOTROUGH, VANCOPEAK, VANCORANDOM, GENTTROUGH, GENTPEAK, GENTRANDOM, TOBRATROUGH, TOBRAPEAK, TOBRARND, AMIKACINPEAK, AMIKACINTROU, AMIKACIN,  in the last 72 hours   Assessment: 1 YOF sent to ED for possible UTI. Recently discharged 9/11 following klebsiella bacteremia (to complete 2 week course with cipro PO), started on vancomycin and zosyn for possible sepsis. CXR showed no acute abnormality.  9/20 >>vancomycin  >> 9/20 >> zosyn >>    Tmax: afeb WBCs: improved to WNL Renal: SCr improved, CrCl ~76 ml/in (CG), ~74 ml/min (normalized) (note quadriplegia)  9/7 blood: K. Pneumoniae (R only to amp) 9/20 blood x 2: NGTD 9/20 urine: NGF 9/20 MRSA screen: sent for cx  Today is day #3 Vancomycin 750 mg IV q12h and Zosyn 3.375g IV q8h (4 hour infusion time) for sepsis, recent gram negative bacteremia.  Repeat blood cultures no growth to date.  Goal of Therapy:  Vancomycin  trough level 15-20 mcg/ml  Plan:  1.  Check vancomycin trough tonight.  Note may be overestimating CrCl due to quadriplegia. 2.  Continue Zosyn 3.375g IV q8h (4 hour infusion time). 3.  F/u cultures results and narrowing of antibiotics.  Clance Boll, PharmD, BCPS Pager: [phone] 03/28/2014 10:05 AM

## 2014-03-28 NOTE — ED Provider Notes (Signed)
CSN: [id number]     Arrival date & time 03/26/14  1115 History   First MD Initiated Contact with Patient 03/26/14 1143     Chief Complaint  Patient presents with  . Fatigue  . Possible UTI      (Consider location/radiation/quality/duration/timing/severity/associated sxs/prior Treatment) The history is provided by the patient and the spouse.   patient presents with generalized weakness. Has had possible low grade fever at home. Recently admitted for sepsis. Has been on antibiotics up until yesterday. Family thinks he may have a UTI. She's been more fatigued. Urine has smelled different. No cough. No nausea vomiting. No chest pain.  Past Medical History  Diagnosis Date  . GERD (gastroesophageal reflux disease)   . Quadriplegia   . Hypertension   . Pressure ulcer of foot   . Chronic pain disorder   . Hyperlipidemia    Past Surgical History  Procedure Laterality Date  . Back surgery  08/2013  . Abdominal hysterectomy     Family History  Problem Relation Age of Onset  . Adopted: Yes   History  Substance Use Topics  . Smoking status: Former Smoker -- 0.25 packs/day    Types: Cigarettes  . Smokeless tobacco: Never Used  . Alcohol Use: No   OB History   Grav Para Term Preterm Abortions TAB SAB Ect Mult Living                 Review of Systems  Constitutional: Positive for fever and appetite change.  Respiratory: Negative for shortness of breath.   Cardiovascular: Negative for chest pain.  Gastrointestinal: Negative for abdominal pain.  Musculoskeletal: Negative for back pain.  Skin: Negative for wound.  Neurological: Negative for weakness.      Allergies  Decadrol; Decadron; and Sodium phosphate  Home Medications   Prior to Admission medications   Medication Sig Start Date End Date Taking? Authorizing Provider  aspirin EC 81 MG tablet Take 81 mg by mouth every morning.    Yes Historical Provider, MD  baclofen (LIORESAL) 10 MG tablet Take 10 mg by mouth 3 (three)  times daily.   Yes Historical Provider, MD  Choline Fenofibrate (TRILIPIX) 135 MG capsule Take 135 mg by mouth every morning.    Yes Historical Provider, MD  ezetimibe (ZETIA) 10 MG tablet Take 10 mg by mouth every morning.    Yes Historical Provider, MD  feeding supplement, ENSURE, (ENSURE) PUDG Take 1 Container by mouth 2 (two) times daily between meals. 03/17/14  Yes Nishant Dhungel, MD  gabapentin (NEURONTIN) 300 MG capsule Take 300 mg by mouth 3 (three) times daily.   Yes Historical Provider, MD  LORazepam (ATIVAN) 1 MG tablet Take 1 tablet (1 mg total) by mouth every 12 (twelve) hours as needed for anxiety. 03/17/14  Yes Nishant Dhungel, MD  magnesium oxide (MAG-OX) 400 MG tablet Take 400 mg by mouth 2 (two) times daily.   Yes Historical Provider, MD  metolazone (ZAROXOLYN) 2.5 MG tablet Take 2.5 mg by mouth every morning.    Yes Historical Provider, MD  Multiple Vitamin (MULTIVITAMIN WITH MINERALS) TABS tablet Take 1 tablet by mouth every morning.    Yes Historical Provider, MD  omeprazole (PRILOSEC) 20 MG capsule Take 20 mg by mouth every morning.    Yes Historical Provider, MD  OxyCODONE (OXYCONTIN) 20 mg T12A 12 hr tablet Take 40 mg by mouth 2 (two) times daily.    Yes Historical Provider, MD  polyethylene glycol (MIRALAX / GLYCOLAX) packet Take 17 g  by mouth daily as needed for mild constipation.   Yes Historical Provider, MD  potassium chloride SA (K-DUR,KLOR-CON) 20 MEQ tablet Take 20 mEq by mouth 3 (three) times daily.   Yes Historical Provider, MD  promethazine (PHENERGAN) 25 MG tablet Take 25 mg by mouth every 6 (six) hours as needed for nausea or vomiting.   Yes Historical Provider, MD  sodium phosphate (FLEET) enema Place 1 enema rectally daily as needed (constipation). follow package directions   Yes Historical Provider, MD  spironolactone (ALDACTONE) 25 MG tablet Take 25 mg by mouth 2 (two) times daily.   Yes Historical Provider, MD  tamsulosin (FLOMAX) 0.4 MG CAPS capsule Take 0.4  mg by mouth every morning.    Yes Historical Provider, MD  torsemide (DEMADEX) 20 MG tablet Take 20 mg by mouth every morning.    Yes Historical Provider, MD  traZODone (DESYREL) 50 MG tablet Take 50 mg by mouth at bedtime.   Yes Historical Provider, MD  trifluoperazine (STELAZINE) 5 MG tablet Take 5 mg by mouth every morning.    Yes Historical Provider, MD  Vortioxetine HBr (BRINTELLIX) 10 MG TABS Take 10 mg by mouth daily.    Yes Historical Provider, MD  zolpidem (AMBIEN) 5 MG tablet Take 5 mg by mouth at bedtime as needed for sleep.   Yes Historical Provider, MD  metFORMIN (GLUCOPHAGE) 500 MG tablet Take 500 mg by mouth daily with breakfast.     Historical Provider, MD   BP 123/55  Pulse 58  Temp(Src) 99 F (37.2 C) (Core (Comment))  Resp 8  Ht  (1.575 m)  Wt 167 lb 12.3 oz (76.1 kg)  BMI 30.68 kg/m2  SpO2 99% Physical Exam  Constitutional: She appears well-developed.  HENT:  Head: Normocephalic.  Eyes: Pupils are equal, round, and reactive to light.  Pulmonary/Chest: Effort normal.  Abdominal: Soft. There is no tenderness.  Musculoskeletal: She exhibits no tenderness.  Neurological: She is alert.  Patient is quadriplegic  Skin: Skin is warm.    ED Course  Procedures (including critical care time) Labs Review Labs Reviewed  MRSA PCR SCREENING - Abnormal; Notable for the following:    MRSA by PCR INVALID RESULTS, SPECIMEN SENT FOR CULTURE (*)    All other components within normal limits  CBC WITH DIFFERENTIAL - Abnormal; Notable for the following:    WBC 12.2 (*)    Platelets 496 (*)    Eosinophils Relative 7 (*)    Monocytes Absolute 1.1 (*)    Eosinophils Absolute 0.9 (*)    All other components within normal limits  BASIC METABOLIC PANEL - Abnormal; Notable for the following:    Chloride 94 (*)    Glucose, Bld 105 (*)    BUN 26 (*)    GFR calc non Af Amer 83 (*)    All other components within normal limits  CBC - Abnormal; Notable for the following:    WBC  11.4 (*)    Hemoglobin 11.9 (*)    Platelets 453 (*)    All other components within normal limits  GLUCOSE, CAPILLARY - Abnormal; Notable for the following:    Glucose-Capillary 243 (*)    All other components within normal limits  GLUCOSE, CAPILLARY - Abnormal; Notable for the following:    Glucose-Capillary 117 (*)    All other components within normal limits  GLUCOSE, CAPILLARY - Abnormal; Notable for the following:    Glucose-Capillary 149 (*)    All other components within normal limits  CBC - Abnormal; Notable for the following:    Hemoglobin 11.1 (*)    HCT 32.5 (*)    All other components within normal limits  BASIC METABOLIC PANEL - Abnormal; Notable for the following:    Sodium 136 (*)    Potassium 3.2 (*)    Chloride 95 (*)    Glucose, Bld 121 (*)    All other components within normal limits  GLUCOSE, CAPILLARY - Abnormal; Notable for the following:    Glucose-Capillary 167 (*)    All other components within normal limits  GLUCOSE, CAPILLARY - Abnormal; Notable for the following:    Glucose-Capillary 208 (*)    All other components within normal limits  GLUCOSE, CAPILLARY - Abnormal; Notable for the following:    Glucose-Capillary 105 (*)    All other components within normal limits  I-STAT CG4 LACTIC ACID, ED - Abnormal; Notable for the following:    Lactic Acid, Venous 3.78 (*)    All other components within normal limits  I-STAT CHEM 8, ED - Abnormal; Notable for the following:    Sodium 135 (*)    Potassium 3.3 (*)    BUN 33 (*)    Creatinine, Ser 1.20 (*)    Glucose, Bld 153 (*)    Calcium, Ion 1.05 (*)    All other components within normal limits  CULTURE, BLOOD (ROUTINE X 2)  CULTURE, BLOOD (ROUTINE X 2)  URINE CULTURE  MRSA CULTURE  URINALYSIS, ROUTINE W REFLEX MICROSCOPIC  MAGNESIUM  PHOSPHORUS  TSH  VANCOMYCIN, TROUGH    Imaging Review No results found.   EKG Interpretation None      MDM   Final diagnoses:  Hypotension, unspecified  hypotension type    Patient with hypotension. Improved IV fluids. Lactic acid elevated. No clear infection. Recent admission for sepsis with Klebsiella presumed from the urine  And bacteremia. Blood pressure has decreased after fluids. Will be admitted to internal medicine   Juliet Rude. Rubin Payor, MD 03/28/14 1459

## 2014-03-28 NOTE — Progress Notes (Signed)
PT Cancellation Note  Patient Details Name: [name] [name] MRN: [id number] DOB: 24-Jun-1946   Cancelled Treatment:    Reason Eval/Treat Not Completed: PT screened, no needs identified, will sign off. Spoke with pt who wishes to work on UE ROM/strength during hospital stay. Will defer to OT for this (OT already following). Pt is a lift for OOB/transfers. Recommend nursing use lift if/when pt requests OOB.    Rebeca Alert, MPT Pager: [phone]

## 2014-03-28 NOTE — Progress Notes (Signed)
PHARMACY - VANCOMYCIN (brief note)  Patient on Day #3 Vancomycin and Zosyn for Sepsis Urine culture - NG final  Blood culture - NG to date  Vancomycin trough = 13.6 mcg/ml (Goal 15-20 mcg/ml)  Assessment:  Vanc trough subtherapeutic  PLAN:  Increase Vancomycin to 1gm IV q12h             Follow up final culture results/sensitivities  Terrilee Files, PharmD 03/28/14 @ 17:54

## 2014-03-28 NOTE — Progress Notes (Signed)
Patient ID: [name] [name], female   DOB: 07-17-1945, 68 y.o.   MRN: [id number] [facility] PROGRESS NOTE  [name] [name] WUJ:[id number] DOB: April 18, 1946 DOA: 03/26/2014 PCP: [name], MD  Brief narrative:  68 year old female with paraplegia following spinal cord injury, hypertension, recently hospitalized from 9/7 - 9/11 for sepsis secondary to Klebsiella, no presented to [facility] ED with altered mental status and progressive failure to thrive. Pt is somewhat lethargic on exam and unable to provide much history but saying she wants to go home as she is not sure why is she in the hospital. She currently denies any specific symptoms such as shortness of breath or chest pain, no reported abd or urinary concerns. Please note that no family is available at bedside for fuller history and most of the details obtained from available records.   In ED, pt noted to be somewhat lethargic, BP soft 86/30, HR 50 - 60's. Blood work notable for WBC 12.2, K 3.3, Cr 1.2. No acute findings on abd/chest XRAY. [facility] asked to admit for further evaluation.   Assessment and Plan:   Assessment and Plan:  Active Problems:  Sepsis  - pt satisfies criteria for sepsis given hypotension, leukocytosis, elevated lactic acid  - source is unclear, last admission it was determined that Klebsiella from urinary source caused bacteremia  - pt is better this AM, more alert and follows commands appropriately but BP is still soft in 80-90's/50's - continue Vancomycin and Zosyn day #3 - urine and blood culture requested, negative to date  - WBC trending down: 12.2 --> 11.4 --> 10.2 Acute encephalopathy  - secondary to principal problem, dehydration, FTT  - clinically stable this am and appears to be at baseline mental state of functioning  - IVF and ABX as noted above  - PT/OT evaluation once pt more medically stable  V-tac at the time of the admission 9/20  - non sustained  - pt asymptomatic and denies chest pain and shortness of  breath  - continue to monitor on tele  Acute renal failure  - determined to be pre renal from dehydration  - continue to hold Torsemide  - continue IVF as Cr is continuing to trend down and is now WNL  - gentle hydration to avoid volume overload  Acute on chronic blood loss anemia  - drop in Hg likely dilutional: 14 --> 11.9 --> 11.1  - no signs of active bleeding  - repeat CBC in AM  Paraplegia, from spinal cord injury  - bed bound  - frequent turning, air matress Hypokalemia  - still slightly low - continue to supplement and repeat BMP in AM DM with complications of neuropathy  - continue to hold Metformin  - Continue SSI  Hypotension  - hold torsemide, spironolactone, Metolazone which pt takes at home  - IVF as noted above and restart home meds as clinically indicated  Protein calorie malnutrition, moderate  - with chronic underlying medical conditions outlined above including paraplegia  - appreciate nutrition input  Decubitus ulcers, multiple sites  - Healing Stage III left heel: 0.2cm x 0.3cm x 0 area with healing skin and small black scabbed area  - Stage II right buttock: 0.5cm x 0.5cm x 0.2cm  - Ulceration right inner thigh: 1.0cm x 5.0cm x 0.2cm  - Dressing rec's: Soft silicone foam dressings to protect and insulate the wounds, float heels at all times, air matress  - appreciate wound care team assistance   DVT prophylaxis  Lovenox SQ while pt is  in hospital  Code Status: Full  Family Communication: Pt at bedside  Disposition Plan: Remains in SDU, possibly to tele bed in AM   IV Access:   Peripheral IV Procedures and diagnostic studies:   Dg Abd Acute W/chest 03/26/2014 No acute abnormality. Mildly prominent stool.  Medical Consultants:   Wound care team  Other Consultants:   Physical therapy, OT Anti-Infectives:   Vancomycin 9/20 -->  Zosyn 9/20 -->  Debbora Presto, MD  [facility] Pager [phone]  If 7PM-7AM, please contact  night-coverage [email or web address] Password [facility] 03/28/2014, 2:53 PM   LOS: 2 days   HPI/Subjective: No events overnight.   Objective: Filed Vitals:   03/28/14 0800 03/28/14 0823 03/28/14 1000 03/28/14 1300  BP: 86/33 102/42 100/62 123/55  Pulse: 55 63 64 58  Temp: 98.2 F (36.8 C) 98.2 F (36.8 C) 98.8 F (37.1 C) 99 F (37.2 C)  TempSrc: Core (Comment) Core (Comment) Core (Comment)   Resp: Height:      Weight:      SpO2: 98% 99% 100% 99%    Intake/Output Summary (Last 24 hours) at 03/28/14 1453 Last data filed at 03/28/14 1351  Gross per 24 hour  Intake   1940 ml  Output   1150 ml  Net    790 ml    Exam:   General:  Pt is alert, follows commands appropriately, not in acute distress  Cardiovascular: Regular rate and rhythm, no rubs, no gallops  Respiratory: Poor inspiratory effort, diminished breath sounds at bases   Abdomen: Soft, non tender, non distended, bowel sounds present, no guarding  Extremities: pulses DP and PT palpable bilaterally  Skin: Stage III left heel: 0.2cm x 0.3cm x 0 area, stage II right buttock: 0.5cm x 0.5cm x 0.2cm, Ulceration right inner thigh: 1.0cm x 5.0cm x 0.2cm   Data Reviewed: Basic Metabolic Panel:  Recent Labs Lab 03/26/14 1225 03/26/14 1238 03/27/14 0340 03/28/14 0335  NA  --  135* 137 136*  K  --  3.3* 3.8 3.2*  CL  --  97 94* 95*  CO2  --   --  29 28  GLUCOSE  --  153* 105* 121*  BUN  --  33* 26* 14  CREATININE  --  1.20* 0.78 0.50  CALCIUM  --   --  8.9 8.8  MG 1.9  --   --   --   PHOS 3.2  --   --   --    CBC:  Recent Labs Lab 03/26/14 1225 03/26/14 1238 03/27/14 0340 03/28/14 0335  WBC 12.2*  --  11.4* 10.2  NEUTROABS 7.2  --   --   --   HGB 12.9 14.3 11.9* 11.1*  HCT 38.6 42.0 36.5 32.5*  MCV 83.0  --  84.5 82.1  PLT 496*  --  453* 377   CBG:  Recent Labs Lab 03/27/14 0744 03/27/14 1214 03/27/14 1718 03/27/14 2120 03/28/14 0741  GLUCAP 117* 149* 167* 208* 105*    Recent  Results (from the past 240 hour(s))  URINE CULTURE     Status: None   Collection Time    03/26/14 12:12 PM      Result Value Ref Range Status   Specimen Description URINE, CATHETERIZED   Final   Special Requests NONE   Final   Culture  Setup Time     Final   Value: 03/26/2014 19:11     Performed at [facility] Count  Final   Value: NO GROWTH     Performed at [facility]   Culture     Final   Value: NO GROWTH     Performed at [facility]   Report Status 03/27/2014 FINAL   Final  CULTURE, BLOOD (ROUTINE X 2)     Status: None   Collection Time    03/26/14 12:25 PM      Result Value Ref Range Status   Specimen Description BLOOD BLOOD LEFT FOREARM   Final   Special Requests BOTTLES DRAWN AEROBIC AND ANAEROBIC   Final   Culture  Setup Time     Final   Value: 03/26/2014 18:13     Performed at [facility]   Culture     Final   Value:        BLOOD CULTURE RECEIVED NO GROWTH TO DATE CULTURE WILL BE HELD FOR 5 DAYS BEFORE ISSUING A FINAL NEGATIVE REPORT     Performed at [facility]   Report Status PENDING   Incomplete  CULTURE, BLOOD (ROUTINE X 2)     Status: None   Collection Time    03/26/14 12:25 PM      Result Value Ref Range Status   Specimen Description BLOOD LEFT ANTECUBITAL   Final   Special Requests BOTTLES DRAWN AEROBIC AND ANAEROBIC   Final   Culture  Setup Time     Final   Value: 03/26/2014 18:13     Performed at [facility]   Culture     Final   Value:        BLOOD CULTURE RECEIVED NO GROWTH TO DATE CULTURE WILL BE HELD FOR 5 DAYS BEFORE ISSUING A FINAL NEGATIVE REPORT     Performed at [facility]   Report Status PENDING   Incomplete  MRSA PCR SCREENING     Status: Abnormal   Collection Time    03/26/14  3:56 PM      Result Value Ref Range Status   MRSA by PCR INVALID RESULTS, SPECIMEN SENT FOR CULTURE (*) NEGATIVE Final   Comment: SPOKE WITH DENNY,S RN 1940 [phone] [place],N                 The GeneXpert MRSA Assay (FDA     approved for NASAL specimens     only), is one component of a     comprehensive MRSA colonization     surveillance program. It is not     intended to diagnose MRSA     infection nor to guide or     monitor treatment for     MRSA infections.  MRSA CULTURE     Status: None   Collection Time    03/26/14  3:56 PM      Result Value Ref Range Status   Specimen Description NASOPHARYNGEAL   Final   Special Requests NONE   Final   Culture     Final   Value: NO SUSPICIOUS COLONIES, CONTINUING TO HOLD     Performed at [facility]   Report Status PENDING   Incomplete     Scheduled Meds: . aspirin EC  81 mg Oral q morning - 10a  . baclofen  10 mg Oral TID  . enoxaparin  injection  40 mg Subcutaneous Q24H  . erythromycin   Both Eyes 3 times per day  . ezetimibe  10 mg Oral q morning - 10a  . gabapentin  300 mg  Oral TID  . insulin aspart  0-9 Units Subcutaneous TID WC  . magnesium oxide  400 mg Oral BID  . OxyCODONE  40 mg Oral BID  . pantoprazole  40 mg Oral Daily  . ZOSYN  IV  3.375 g Intravenous 3 times per day  . potassium chloride SA  20 mEq Oral TID  . tamsulosin  0.4 mg Oral q morning - 10a  . traZODone  50 mg Oral QHS  . trifluoperazine  5 mg Oral q morning - 10a  . vancomycin 750 mg   750 mg Intravenous Q12H  . Vortioxetine HBr  10 mg Oral Daily   Continuous Infusions: . sodium chloride 500 mL (03/28/14 0100)

## 2014-03-28 NOTE — Evaluation (Signed)
Occupational Therapy Evaluation Patient Details Name: [name] [name] MRN: [id number] DOB: May 18, 1946 Today's Date: 03/28/2014    History of Present Illness Pt is a 68yo female admitted with ftt, mental status changes and UTI.  Pt we quadriplegia for long term and was recently in hospital for sepsis due to klebsiella.  Pt states she has been bed bound for over a year.    Clinical Impression   Pt admitted for the above diagnosis and has the deficits listed below. Pt would benefit from cont OT to attempt to educate and increase independence with some of her very basic adls such as grooming and feeding so pt can be more I with basic adls at home and decrease burden of care on caregiver.    Follow Up Recommendations  Outpatient OT;Supervision/Assistance - 24 hour    Equipment Recommendations  Other (comment) (will evaluate for feeding equipment)    Recommendations for Other Services       Precautions / Restrictions Precautions Precautions: Fall Precaution Comments: Pt is accustomed to getting OOB with a lift. Restrictions Weight Bearing Restrictions: No Other Position/Activity Restrictions: pt is quadriplegic.  Pt with new a flutter morning of evaluation.        Mobility Bed Mobility Overal bed mobility: +2 for physical assistance             General bed mobility comments: Pt able to initiate rolling but dependent otherwise.  Transfers Overall transfer level: Needs assistance Equipment used:  (pt states they use hoyer lift at [facility].)                  Balance Overall balance assessment: Needs assistance                                          ADL Overall ADL's : Needs assistance/impaired Eating/Feeding: Total assistance;Bed level Eating/Feeding Details (indicate cue type and reason): pt states she does not feed self due to tremors and it is faster and easier to have caregiver do it although she would like to be more I with this if  possible. Grooming: Therapist, nutritional;Moderate assistance;Bed level (otherwise dependent on caregiver.) Grooming Details (indicate cue type and reason): Pt states caregiver does most grooming outside of washing her face. Upper Body Bathing: Maximal assistance;Bed level   Lower Body Bathing: Total assistance;Bed level   Upper Body Dressing : Total assistance;Bed level   Lower Body Dressing: Total assistance;Bed level   Toilet Transfer: Total assistance (bowel program done at bed level.)   Toileting- Clothing Manipulation and Hygiene: Total assistance;Bed level   Tub/ Shower Transfer:  (pt bathes in bed.)   Functional mobility during ADLs: Maximal assistance General ADL Comments: Pt overall has been dependent with all mobilty and adls for some time now.  She states that caregivers do sit her occasionally on the EOB and do some ROM.  Pt does have desire to do more for herself.     Vision                     Perception Perception Perception Tested?: No   Praxis Praxis Praxis tested?: Not tested    Pertinent Vitals/Pain Pain Assessment: 0-10 Pain Score: 8  Pain Location: B shoulders with ROM Pain Descriptors / Indicators: Aching Pain Intervention(s): Limited activity within patient's tolerance;Repositioned     Hand Dominance Right   Extremity/Trunk Assessment Upper Extremity Assessment Upper Extremity  Assessment: RUE deficits/detail;LUE deficits/detail RUE Deficits / Details: Pt PROM limited in shoulder to 110 degrees.  Otherwise pt very tight in finger joints but WFL.  Strength :  shoulders 2/5, biceps 3/5, triceps 3/5, wrist 3/5, fingers 2/5. RUE Coordination: decreased fine motor;decreased gross motor LUE Deficits / Details: Pt with PROM WFL except shoulder limited to 110 due to pain.  Strength:  shoulders 2/5, biceps 3/5, triceps 3/5, wrist 3/5, hand 2/5. LUE Coordination: decreased fine motor;decreased gross motor   Lower Extremity Assessment Lower Extremity  Assessment: Defer to PT evaluation       Communication Communication Communication: No difficulties   Cognition Arousal/Alertness: Awake/alert Behavior During Therapy: Flat affect Overall Cognitive Status: No family/caregiver present to determine baseline cognitive functioning       Memory: Decreased short-term memory             General Comments       Exercises Exercises: General Upper Extremity     Shoulder Instructions      Home Living Family/patient expects to be discharged to:: Private residence Living Arrangements: Children;Other (Comment) (pt lives with son and has round clock nursing care.) Available Help at Discharge: Available 24 hours/day;Personal care attendant;Family Type of Home: House Home Access: Stairs to enter [employer] of Steps: 2 (aids bump her up the 2 small steps while in w/c)   Home Layout: One level     Bathroom Shower/Tub: Other (comment) (pt bathes in bed)         Home Equipment: Bedside commode;Wheelchair - manual;Hospital bed   Additional Comments: Pt has nurses that do 90% of her self care for her but feel this pt could definitely do more for herself.  Pt states she had just started OP therapy just before admission.      Prior Functioning/Environment Level of Independence: Needs assistance  Gait / Transfers Assistance Needed: total assist with w/c mobility and hoyer used for all tranfers. ADL's / Homemaking Assistance Needed: Pt states she assists wtih UE bathing only. Otherwise she is dependent for caregiver on all mobility and adls.        OT Diagnosis: Generalized weakness;Cognitive deficits;Acute pain;Paresis   OT Problem List: Decreased strength;Decreased range of motion;Decreased activity tolerance;Impaired balance (sitting and/or standing);Decreased coordination;Decreased cognition;Decreased knowledge of use of DME or AE;Impaired sensation;Impaired UE functional use;Pain;Increased edema   OT  Treatment/Interventions: Self-care/ADL training    OT Goals(Current goals can be found in the care plan section) Acute Rehab OT Goals Patient Stated Goal: to go back home and start therapy again. OT Goal Formulation: With patient Time For Goal Achievement: 04/11/14 Potential to Achieve Goals: Good ADL Goals Pt Will Perform Eating: with min assist;sitting;with adaptive utensils;with assist to don/doff brace/orthosis Pt Will Perform Grooming: with min assist;with adaptive equipment;sitting Pt/caregiver will Perform Home Exercise Program: Increased ROM;Both right and left upper extremity;With minimal assist;With written HEP provided  OT Frequency: Min 2X/week   Barriers to D/C:    pt states she has round the clock care.       Co-evaluation              End of Session Nurse Communication: Mobility status  Activity Tolerance: Patient limited by pain;Other (comment) (nursing asked to limit physical activity due to aflutter) Patient left: in bed;with call bell/phone within reach   Time: 0981-1914 OT Time Calculation (min): 29 min Charges:  OT General Charges $OT Visit: 1 Procedure OT Evaluation $Initial OT Evaluation Tier I: 1 Procedure OT Treatments $Self Care/Home Management : 8-22  mins $Therapeutic Exercise: 8-22 mins G-Codes:    Hope Budds, OTR/L 03/28/2014, 9:28 AM [phone]

## 2014-03-28 NOTE — Plan of Care (Signed)
Problem: Phase I Progression Outcomes Goal: Hemodynamically stable Outcome: Progressing Pt has bursts of atrial flutter. Dr Elisabeth Pigeon has been notifed.

## 2014-03-29 DIAGNOSIS — R9431 Abnormal electrocardiogram [ECG] [EKG]: Secondary | ICD-10-CM

## 2014-03-29 DIAGNOSIS — R339 Retention of urine, unspecified: Secondary | ICD-10-CM

## 2014-03-29 LAB — GLUCOSE, CAPILLARY
GLUCOSE-CAPILLARY: 114 mg/dL — AB (ref 70–99)
GLUCOSE-CAPILLARY: 160 mg/dL — AB (ref 70–99)
Glucose-Capillary: 125 mg/dL — ABNORMAL HIGH (ref 70–99)
Glucose-Capillary: 152 mg/dL — ABNORMAL HIGH (ref 70–99)

## 2014-03-29 LAB — CBC
HCT: 34 % — ABNORMAL LOW (ref 36.0–46.0)
Hemoglobin: 11.4 g/dL — ABNORMAL LOW (ref 12.0–15.0)
MCH: 27.7 pg (ref 26.0–34.0)
MCHC: 33.5 g/dL (ref 30.0–36.0)
MCV: 82.7 fL (ref 78.0–100.0)
Platelets: 354 10*3/uL (ref 150–400)
RBC: 4.11 MIL/uL (ref 3.87–5.11)
RDW: 12.9 % (ref 11.5–15.5)
WBC: 10.4 10*3/uL (ref 4.0–10.5)

## 2014-03-29 LAB — BASIC METABOLIC PANEL
ANION GAP: 11 (ref 5–15)
BUN: 8 mg/dL (ref 6–23)
CHLORIDE: 96 meq/L (ref 96–112)
CO2: 30 mEq/L (ref 19–32)
Calcium: 9.2 mg/dL (ref 8.4–10.5)
Creatinine, Ser: 0.48 mg/dL — ABNORMAL LOW (ref 0.50–1.10)
Glucose, Bld: 155 mg/dL — ABNORMAL HIGH (ref 70–99)
POTASSIUM: 3.5 meq/L — AB (ref 3.7–5.3)
SODIUM: 137 meq/L (ref 137–147)

## 2014-03-29 LAB — MRSA CULTURE

## 2014-03-29 LAB — MAGNESIUM: MAGNESIUM: 1.6 mg/dL (ref 1.5–2.5)

## 2014-03-29 MED ORDER — DEXTROSE 5 % IV SOLN
1.0000 g | INTRAVENOUS | Status: DC
  Administered 2014-03-29 – 2014-03-30 (×2): 1 g via INTRAVENOUS
  Filled 2014-03-29 (×3): qty 10

## 2014-03-29 MED ORDER — METOPROLOL TARTRATE 1 MG/ML IV SOLN
5.0000 mg | Freq: Once | INTRAVENOUS | Status: AC
  Administered 2014-03-29: 5 mg via INTRAVENOUS
  Filled 2014-03-29: qty 5

## 2014-03-29 MED ORDER — POTASSIUM CHLORIDE CRYS ER 20 MEQ PO TBCR
30.0000 meq | EXTENDED_RELEASE_TABLET | Freq: Three times a day (TID) | ORAL | Status: DC
  Administered 2014-03-29 – 2014-03-31 (×7): 30 meq via ORAL
  Filled 2014-03-29 (×10): qty 1

## 2014-03-29 NOTE — Progress Notes (Signed)
Patient noted to have what looks to be atrial flutter on monitor, with heart rate registering up to 200.  Looks like monitor is registering p waves, as beats.  Patient is asymptomatic, Dr. Gonzella Lex notified, with orders received for Lopressor.  Philomena Doheny RN

## 2014-03-29 NOTE — Progress Notes (Signed)
CARE MANAGEMENT NOTE 03/29/2014  Patient:  [name] [name], [name] [name]   Account Number:  [id number]  Date Initiated:  03/27/2014  Documentation initiated by:  Alixandra Alfieri  Subjective/Objective Assessment:   pt from snf recently dcd after uti and sepsis/readmitted with elevated temp and lactic acid/ua clean this admission/bld cultures pending     Action/Plan:   snf when ready   Anticipated DC Date:  03/30/2014   Anticipated DC Plan:  HOME W HOME HEALTH SERVICES  In-house referral  Clinical Social Worker      DC Planning Services  CM consult      [facility] Choice  Resumption Of Svcs/PTA Provider   Choice offered to / List presented to:  NA   DME arranged  NA      DME agency  NA     HH arranged  NA      HH agency  [employer]   Status of service:  In process, will continue to follow Medicare Important Message given?   (If response is "NO", the following Medicare IM given date fields will be blank) Date Medicare IM given:   Medicare IM given by:   Date Additional Medicare IM given:   Additional Medicare IM given by:    Discharge Disposition:    Per UR Regulation:  Reviewed for med. necessity/level of care/duration of stay  If discussed at Long Length of Stay Meetings, dates discussed:    Comments:  [id number]/Onalee Steinbach,RN,BSN,CCM: [id number] Jimma Ortman,Rn,BSN,CCM: Patient is workers comp Investment banker, corporate is [name] at [phone]/[id number] is a home bound patient and has 24 /7 care through [facility]. hx of fall at work-2004/has required multiple back surgries and is a parapelgic from spinal cord compression/

## 2014-03-29 NOTE — Progress Notes (Signed)
[facility] PROGRESS NOTE  [name] [name] WGN:[id number] DOB: June 08, 1946 DOA: 03/26/2014 PCP: [name], MD   Brief narrative 68 year old female with paraplegia following spinal cord injury, hypertension, recently hospitalized from 9/7 - 9/11 for sepsis secondary to Klebsiella, presented to [facility] ED with altered mental status and progressive failure to thrive. In ED, pt noted to be somewhat lethargic, BP soft 86/30, HR 50 - 60's. Blood work notable for WBC 12.2, K 3.3, Cr 1.2. No acute findings on abd/chest xray . Pt admitted to step down for sepsis.  Assessment/Plan: Sepsis  - patient presented with hypotension, leukocytosis, elevated lactic acid . no clear source of infection. -Recently had sepsis with klebsiella UTI and was discharge home on 2 weeks course fo abx. UTI may still be the source fo sepsis, although UA has been negative. -sepsis now resolved. - empiric Vancomycin and Zosyn day #4, blood and urien cx negative. Will switch to IV rocephin - WBC trending down  Acute encephalopathy  - secondary to sepsis and dehydration -resolved. PT eval  NSVT on admission  has prolonged Qtc in 500s. Monitor on tele. Replete k and mmg - pt asymptomatic   Acute renal failure  -  Mild and pre renal due to  dehydration . Now resolved. - continue to hold Torsemide    anemia  Mild and stable   Chronic hypokalemia  replenish   Paraplegia, from spinal cord injury  - bed bound  - frequent turning, air matress   DM with neuropathy  - continue to hold Metformin  - Continue SSI    Protein calorie malnutrition, moderate  - appreciate nutrition input   Decubitus ulcers, multiple sites  - Healing Stage III left heel: 0.2cm x 0.3cm x 0 area with healing skin and small black scabbed area  - Stage II right buttock: 0.5cm x 0.5cm x 0.2cm  - Ulceration right inner thigh: 1.0cm x 5.0cm x 0.2cm  - Dressing rec's: Soft silicone foam dressings to protect and insulate the wounds, float  heels at all times, air matress  - appreciate wound care team assistance   DVT prophylaxis  Lovenox SQ   Code Status: Full  Family Communication: none at bedside  Disposition Plan: transfer to tele. Home possibly  In 1-2 days upon improvment   HPI/Subjective: Denies any symptoms. Asking when can she go home  Objective: Filed Vitals:   03/29/14 0800  BP: 117/45  Pulse: 54  Temp: 98.6 F (37 C)  Resp: 8    Intake/Output Summary (Last 24 hours) at 03/29/14 1443 Last data filed at 03/29/14 1420  Gross per 24 hour  Intake  571.5 ml  Output   1600 ml  Net -1028.5 ml   Filed Weights   03/27/14 0400 03/28/14 0400 03/29/14 0333  Weight: 73.9 kg (162 lb 14.7 oz) 76.1 kg (167 lb 12.3 oz) 77.7 kg (171 lb 4.8 oz)    Exam:   General:  NAD  HEENT no pallor, puffy face  Cardiovascular: NS1&S2, no murmurs  Respiratory: clear b/l  Abdomen: soft, NT, ND, BS+  Musculoskeletal: warm, no edema  CNS: alert and oriented, quadrplegic   Data Reviewed: Basic Metabolic Panel:  Recent Labs Lab 03/26/14 1225 03/26/14 1238 03/27/14 0340 03/28/14 0335 03/29/14 0300  NA  --  135* 137 136* 137  K  --  3.3* 3.8 3.2* 3.5*  CL  --  97 94* 95* 96  CO2  --   --  GLUCOSE  --  153* 105*  121* 155*  BUN  --  33* 26* 14 8  CREATININE  --  1.20* 0.78 0.50 0.48*  CALCIUM  --   --  8.9 8.8 9.2  MG 1.9  --   --   --  1.6  PHOS 3.2  --   --   --   --    Liver Function Tests: No results found for this basename: AST, ALT, ALKPHOS, BILITOT, PROT, ALBUMIN,  in the last 168 hours No results found for this basename: LIPASE, AMYLASE,  in the last 168 hours No results found for this basename: AMMONIA,  in the last 168 hours CBC:  Recent Labs Lab 03/26/14 1225 03/26/14 1238 03/27/14 0340 03/28/14 0335 03/29/14 0300  WBC 12.2*  --  11.4* 10.2 10.4  NEUTROABS 7.2  --   --   --   --   HGB 12.9 14.3 11.9* 11.1* 11.4*  HCT 38.6 42.0 36.5 32.5* 34.0*  MCV 83.0  --  84.5 82.1 82.7   PLT 496*  --  453* 377 354   Cardiac Enzymes: No results found for this basename: CKTOTAL, CKMB, CKMBINDEX, TROPONINI,  in the last 168 hours BNP (last 3 results) No results found for this basename: PROBNP,  in the last 8760 hours CBG:  Recent Labs Lab 03/27/14 2120 03/28/14 0741 03/28/14 1211 03/28/14 1650 03/29/14 0745  GLUCAP 208* 105* 152* 131* 114*    Recent Results (from the past 240 hour(s))  URINE CULTURE     Status: None   Collection Time    03/26/14 12:12 PM      Result Value Ref Range Status   Specimen Description URINE, CATHETERIZED   Final   Special Requests NONE   Final   Culture  Setup Time     Final   Value: 03/26/2014 19:11     Performed at [facility] Count     Final   Value: NO GROWTH     Performed at [facility]   Culture     Final   Value: NO GROWTH     Performed at [facility]   Report Status 03/27/2014 FINAL   Final  CULTURE, BLOOD (ROUTINE X 2)     Status: None   Collection Time    03/26/14 12:25 PM      Result Value Ref Range Status   Specimen Description BLOOD BLOOD LEFT FOREARM   Final   Special Requests BOTTLES DRAWN AEROBIC AND ANAEROBIC   Final   Culture  Setup Time     Final   Value: 03/26/2014 18:13     Performed at [facility]   Culture     Final   Value:        BLOOD CULTURE RECEIVED NO GROWTH TO DATE CULTURE WILL BE HELD FOR 5 DAYS BEFORE ISSUING A FINAL NEGATIVE REPORT     Performed at [facility]   Report Status PENDING   Incomplete  CULTURE, BLOOD (ROUTINE X 2)     Status: None   Collection Time    03/26/14 12:25 PM      Result Value Ref Range Status   Specimen Description BLOOD LEFT ANTECUBITAL   Final   Special Requests BOTTLES DRAWN AEROBIC AND ANAEROBIC   Final   Culture  Setup Time     Final   Value: 03/26/2014 18:13     Performed at [facility]   Culture     Final  Value:        BLOOD CULTURE RECEIVED NO GROWTH TO DATE CULTURE WILL BE HELD FOR  5 DAYS BEFORE ISSUING A FINAL NEGATIVE REPORT     Performed at [facility]   Report Status PENDING   Incomplete  MRSA PCR SCREENING     Status: Abnormal   Collection Time    03/26/14  3:56 PM      Result Value Ref Range Status   MRSA by PCR INVALID RESULTS, SPECIMEN SENT FOR CULTURE (*) NEGATIVE Final   Comment: SPOKE WITH DENNY,S RN 1940 [phone] [place],N                The GeneXpert MRSA Assay (FDA     approved for NASAL specimens     only), is one component of a     comprehensive MRSA colonization     surveillance program. It is not     intended to diagnose MRSA     infection nor to guide or     monitor treatment for     MRSA infections.  MRSA CULTURE     Status: None   Collection Time    03/26/14  3:56 PM      Result Value Ref Range Status   Specimen Description NASOPHARYNGEAL   Final   Special Requests NONE   Final   Culture     Final   Value: NO STAPHYLOCOCCUS AUREUS ISOLATED     Note: NOMRSA     Performed at [facility]   Report Status 03/29/2014 FINAL   Final     Studies: No results found.  Scheduled Meds: . antiseptic oral rinse  7 mL Mouth Rinse BID  . aspirin EC  81 mg Oral q morning - 10a  . baclofen  10 mg Oral TID  . enoxaparin (LOVENOX) injection  40 mg Subcutaneous Q24H  . erythromycin   Both Eyes 3 times per day  . ezetimibe  10 mg Oral q morning - 10a  . feeding supplement (ENSURE)  1 Container Oral BID BM  . gabapentin  300 mg Oral TID  . insulin aspart  0-9 Units Subcutaneous TID WC  . lactose free nutrition  237 mL Oral BID BM  . magnesium oxide  400 mg Oral BID  . OxyCODONE  40 mg Oral BID  . pantoprazole  40 mg Oral Daily  . piperacillin-tazobactam (ZOSYN)  IV  3.375 g Intravenous 3 times per day  . potassium chloride SA  30 mEq Oral TID  . tamsulosin  0.4 mg Oral q morning - 10a  . traZODone  50 mg Oral QHS  . trifluoperazine  5 mg Oral q morning - 10a  . vancomycin  1,000 mg Intravenous Q12H  . Vortioxetine HBr  10 mg  Oral Daily   Continuous Infusions: . sodium chloride 500 mL (03/28/14 0100)       Time spent: 25 minutes    [name] [name]  [facility] Pager [phone] If 7PM-7AM, please contact night-coverage at [email or web address], password [facility] 03/29/2014, 2:43 PM  LOS: 3 days

## 2014-03-29 NOTE — Plan of Care (Signed)
Problem: Phase I Progression Outcomes Goal: Pain controlled with appropriate interventions Outcome: Progressing Pt is being covered by scheduled medication and has not requested additional medication.

## 2014-03-30 ENCOUNTER — Ambulatory Visit: Payer: Self-pay | Admitting: Physical Therapy

## 2014-03-30 DIAGNOSIS — I4892 Unspecified atrial flutter: Secondary | ICD-10-CM

## 2014-03-30 DIAGNOSIS — IMO0002 Reserved for concepts with insufficient information to code with codable children: Secondary | ICD-10-CM

## 2014-03-30 DIAGNOSIS — I472 Ventricular tachycardia, unspecified: Secondary | ICD-10-CM

## 2014-03-30 DIAGNOSIS — G8220 Paraplegia, unspecified: Secondary | ICD-10-CM

## 2014-03-30 DIAGNOSIS — I4729 Other ventricular tachycardia: Secondary | ICD-10-CM

## 2014-03-30 DIAGNOSIS — N390 Urinary tract infection, site not specified: Secondary | ICD-10-CM

## 2014-03-30 LAB — GLUCOSE, CAPILLARY
GLUCOSE-CAPILLARY: 107 mg/dL — AB (ref 70–99)
GLUCOSE-CAPILLARY: 145 mg/dL — AB (ref 70–99)
Glucose-Capillary: 112 mg/dL — ABNORMAL HIGH (ref 70–99)

## 2014-03-30 LAB — BASIC METABOLIC PANEL
Anion gap: 11 (ref 5–15)
BUN: 7 mg/dL (ref 6–23)
CALCIUM: 9.3 mg/dL (ref 8.4–10.5)
CO2: 28 mEq/L (ref 19–32)
Chloride: 99 mEq/L (ref 96–112)
Creatinine, Ser: 0.46 mg/dL — ABNORMAL LOW (ref 0.50–1.10)
GFR calc Af Amer: >90 mL/min (ref >90)
GFR calc non Af Amer: >90 mL/min (ref >90)
GLUCOSE: 102 mg/dL — AB (ref 70–99)
POTASSIUM: 4.3 meq/L (ref 3.7–5.3)
Sodium: 138 mEq/L (ref 137–147)

## 2014-03-30 LAB — MAGNESIUM: Magnesium: 1.7 mg/dL (ref 1.5–2.5)

## 2014-03-30 MED ORDER — MAGNESIUM OXIDE 400 (241.3 MG) MG PO TABS
600.0000 mg | ORAL_TABLET | Freq: Two times a day (BID) | ORAL | Status: DC
  Administered 2014-03-30 – 2014-03-31 (×3): 600 mg via ORAL
  Filled 2014-03-30 (×4): qty 1.5

## 2014-03-30 MED ORDER — MAGNESIUM SULFATE 40 MG/ML IJ SOLN
2.0000 g | Freq: Once | INTRAMUSCULAR | Status: AC
  Administered 2014-03-30: 2 g via INTRAVENOUS
  Filled 2014-03-30: qty 50

## 2014-03-30 NOTE — Progress Notes (Signed)
[facility] PROGRESS NOTE  [name] [name] ZOX:[id number] DOB: [birth year] DOA: 03/26/2014 PCP: [name], MD   Brief narrative 68 year old female with paraplegia following spinal cord injury, hypertension, recently hospitalized from 9/7 - 9/11 for sepsis secondary to Klebsiella, presented to [facility] ED with altered mental status and progressive failure to thrive. In ED, pt noted to be somewhat lethargic, BP soft 86/30, HR 50 - 60's. Blood work notable for WBC 12.2, K 3.3, Cr 1.2. No acute findings on abd/chest xray . Pt admitted to step down for sepsis.  Assessment/Plan: Sepsis  - patient presented with hypotension, leukocytosis, elevated lactic acid . no clear source of infection. -Recently had sepsis with klebsiella UTI and was discharge home on 2 weeks course fo abx. UTI may still be the source of sepsis, although UA has been negative. ( she was admitted in June 2015 for micrococcus UTI as well) -sepsis now resolved. - empiric Vancomycin and Zosyn ( for 4 days) , blood and urine cx negative. Will switched to IV rocephin on 9/23 ( day 6 of abx) - WBC  Normal   Acute encephalopathy  - secondary to sepsis and dehydration -resolved. PT eval  ?NSVT on admission Possibly artifact due to tremors. Tele monitor recorded on 9/23 as possible aflutter. Cardiology was consulted who reviewed 12 lead and suggested this was from tremors.  has prolonged Qtc in 500s with PVCs on monitor.  Replete k and mg - pt asymptomatic   Acute renal failure  -  Mild and pre renal due to  dehydration . Now resolved. - continue to hold Torsemide    anemia  Mild and stable   Chronic hypokalemia  replenish   Paraplegia, from spinal cord injury  - bed bound  - frequent turning, air matress   DM with neuropathy  - continue to hold Metformin  - Continue SSI    Protein calorie malnutrition, moderate  - appreciate nutrition input   Decubitus ulcers, multiple sites  - Healing Stage III left heel: 0.2cm  x 0.3cm x 0 area with healing skin and small black scabbed area  - Stage II right buttock: 0.5cm x 0.5cm x 0.2cm  - Ulceration right inner thigh: 1.0cm x 5.0cm x 0.2cm  - Dressing rec's: Soft silicone foam dressings to protect and insulate the wounds, float heels at all times, air matress  - appreciate wound care team assistance   DVT prophylaxis  Lovenox SQ   Code Status: Full  Family Communication: none at bedside  Disposition Plan: transfer to tele. Home tomorrow if stable and completion of  7 day IV abx   HPI/Subjective: Denies any symptoms. Telemetry showed sings of Afib which was mainly artifact from tremors  Objective: Filed Vitals:   03/30/14 0606  BP: 101/54  Pulse: 59  Temp: 98.2 F (36.8 C)  Resp:     Intake/Output Summary (Last 24 hours) at 03/30/14 1221 Last data filed at 03/30/14 0934  Gross per 24 hour  Intake 468.83 ml  Output    551 ml  Net -82.17 ml   Filed Weights   03/27/14 0400 03/28/14 0400 03/29/14 0333  Weight: 73.9 kg (162 lb 14.7 oz) 76.1 kg (167 lb 12.3 oz) 77.7 kg (171 lb 4.8 oz)    Exam:   General:  NAD  HEENT no pallor, moist mucosa  Cardiovascular: NS1&S2, no murmurs  Respiratory: clear b/l  Abdomen: soft, NT, ND, BS+  Musculoskeletal: warm, no edema  CNS: alert and oriented, quadrplegic   Data Reviewed: Basic Metabolic  Panel:  Recent Labs Lab 03/26/14 1225 03/26/14 1238 03/27/14 0340 03/28/14 0335 03/29/14 0300 03/30/14 0450  NA  --  135* 137 136* 137 138  K  --  3.3* 3.8 3.2* 3.5* 4.3  CL  --  97 94* 95* 96 99  CO2  --   --  GLUCOSE  --  153* 105* 121* 155* 102*  BUN  --  33* 26* CREATININE  --  1.20* 0.78 0.50 0.48* 0.46*  CALCIUM  --   --  8.9 8.8 9.2 9.3  MG 1.9  --   --   --  1.6 1.7  PHOS 3.2  --   --   --   --   --    Liver Function Tests: No results found for this basename: AST, ALT, ALKPHOS, BILITOT, PROT, ALBUMIN,  in the last 168 hours No results found for this basename:  LIPASE, AMYLASE,  in the last 168 hours No results found for this basename: AMMONIA,  in the last 168 hours CBC:  Recent Labs Lab 03/26/14 1225 03/26/14 1238 03/27/14 0340 03/28/14 0335 03/29/14 0300  WBC 12.2*  --  11.4* 10.2 10.4  NEUTROABS 7.2  --   --   --   --   HGB 12.9 14.3 11.9* 11.1* 11.4*  HCT 38.6 42.0 36.5 32.5* 34.0*  MCV 83.0  --  84.5 82.1 82.7  PLT 496*  --  453* 377 354   Cardiac Enzymes: No results found for this basename: CKTOTAL, CKMB, CKMBINDEX, TROPONINI,  in the last 168 hours BNP (last 3 results) No results found for this basename: PROBNP,  in the last 8760 hours CBG:  Recent Labs Lab 03/29/14 1159 03/29/14 1618 03/29/14 2114 03/30/14 0743 03/30/14 1138  GLUCAP 125* 152* 160* 107* 145*    Recent Results (from the past 240 hour(s))  URINE CULTURE     Status: None   Collection Time    03/26/14 12:12 PM      Result Value Ref Range Status   Specimen Description URINE, CATHETERIZED   Final   Special Requests NONE   Final   Culture  Setup Time     Final   Value: 03/26/2014 19:11     Performed at [facility] Count     Final   Value: NO GROWTH     Performed at [facility]   Culture     Final   Value: NO GROWTH     Performed at [facility]   Report Status 03/27/2014 FINAL   Final  CULTURE, BLOOD (ROUTINE X 2)     Status: None   Collection Time    03/26/14 12:25 PM      Result Value Ref Range Status   Specimen Description BLOOD BLOOD LEFT FOREARM   Final   Special Requests BOTTLES DRAWN AEROBIC AND ANAEROBIC   Final   Culture  Setup Time     Final   Value: 03/26/2014 18:13     Performed at [facility]   Culture     Final   Value:        BLOOD CULTURE RECEIVED NO GROWTH TO DATE CULTURE WILL BE HELD FOR 5 DAYS BEFORE ISSUING A FINAL NEGATIVE REPORT     Performed at [facility]   Report Status PENDING   Incomplete  CULTURE, BLOOD (ROUTINE X 2)     Status: None   Collection  Time     03/26/14 12:25 PM      Result Value Ref Range Status   Specimen Description BLOOD LEFT ANTECUBITAL   Final   Special Requests BOTTLES DRAWN AEROBIC AND ANAEROBIC   Final   Culture  Setup Time     Final   Value: 03/26/2014 18:13     Performed at [facility]   Culture     Final   Value:        BLOOD CULTURE RECEIVED NO GROWTH TO DATE CULTURE WILL BE HELD FOR 5 DAYS BEFORE ISSUING A FINAL NEGATIVE REPORT     Performed at [facility]   Report Status PENDING   Incomplete  MRSA PCR SCREENING     Status: Abnormal   Collection Time    03/26/14  3:56 PM      Result Value Ref Range Status   MRSA by PCR INVALID RESULTS, SPECIMEN SENT FOR CULTURE (*) NEGATIVE Final   Comment: SPOKE WITH DENNY,S RN 1940 [phone] COVINGTON,N                The GeneXpert MRSA Assay (FDA     approved for NASAL specimens     only), is one component of a     comprehensive MRSA colonization     surveillance program. It is not     intended to diagnose MRSA     infection nor to guide or     monitor treatment for     MRSA infections.  MRSA CULTURE     Status: None   Collection Time    03/26/14  3:56 PM      Result Value Ref Range Status   Specimen Description NASOPHARYNGEAL   Final   Special Requests NONE   Final   Culture     Final   Value: NO STAPHYLOCOCCUS AUREUS ISOLATED     Note: NOMRSA     Performed at [facility]   Report Status 03/29/2014 FINAL   Final     Studies: No results found.  Scheduled Meds: . antiseptic oral rinse  7 mL Mouth Rinse BID  . aspirin EC  81 mg Oral q morning - 10a  . baclofen  10 mg Oral TID  . cefTRIAXone (ROCEPHIN)  IV  1 g Intravenous Q24H  . enoxaparin (LOVENOX) injection  40 mg Subcutaneous Q24H  . erythromycin   Both Eyes 3 times per day  . ezetimibe  10 mg Oral q morning - 10a  . feeding supplement (ENSURE)  1 Container Oral BID BM  . gabapentin  300 mg Oral TID  . insulin aspart  0-9 Units Subcutaneous TID WC  . lactose free nutrition   237 mL Oral BID BM  . magnesium oxide  600 mg Oral BID  . OxyCODONE  40 mg Oral BID  . pantoprazole  40 mg Oral Daily  . potassium chloride SA  30 mEq Oral TID  . tamsulosin  0.4 mg Oral q morning - 10a  . traZODone  50 mg Oral QHS  . trifluoperazine  5 mg Oral q morning - 10a  . Vortioxetine HBr  10 mg Oral Daily   Continuous Infusions: . sodium chloride 500 mL (03/28/14 0100)       Time spent: 25 minutes    Buckley Bradly  [facility] Pager [phone] If 7PM-7AM, please contact night-coverage at [email or web address], password [facility] 03/30/2014, 12:21 PM  LOS: 4 days

## 2014-03-30 NOTE — Progress Notes (Signed)
PT Cancellation Note  Patient Details Name: [name] [name] MRN: [id number] DOB: 30-Jun-1946   Cancelled Treatment:    Reason Eval/Treat Not Completed: PT screened, no needs identified, will sign off  See prior PT note--pt has been bed bound for over a year, she has 24/7 care and uses a lift for transfers; she may benefit from HHPT for UE strengthening and ?sitting balance/core work to [employer] burden of care, OT following for UB strengthening and ADLS this venue;    [facility] 03/30/2014, 11:05 AM

## 2014-03-30 NOTE — Consult Note (Signed)
/      Reason for Consult: Atrial flutter, nonsustained VT, long QT interval  Requesting Physician: Dhungel  Cardiologist: None  HPI: This is a 68 y.o. female with a past medical history significant for paraplegia following spinal cord injury, hypertension, recently hospitalized from 9/7 - 9/11 for sepsis secondary to Klebsiella, presented to [facility] ED 03/26/14 with altered mental status and progressive failure to thrive and hypotension and biochemical changes consistent with sepsis.  On arrival she had a prolonged QT interval (520 ms) and reportedly had nonsustained VT. No monitor strip/ECG documentation of this is available. The prolonged QT has improved and was probably related to metabolic abnormalities, chronic neuroleptic therapy and possibly Cipro.  She reportedly has had atrial flutter - seen on monitor and not confirmed on the 12 lead ECG. At time the monitor shows very rapid ventricular rates, over 200 bpm.  Careful review of the monitor strips shows a high frequency artifact, 200 bpm, likely due to skeletal muscle tremor. This causes an atrial flutter like appearance. At times, the amplitude of the artifact is higher and is interpreted by the monitor as representing a high ventricular rate as well.  No true arrhythmia is seen. The true rhythm is mild sinus bradycardia in the 50s, as documented by her vital sign collection.  The patient does not have known structural heart disease and had a normal echo earlier this month. She seems to be recovering well from her second episode of sepsis.  PMHx:  Past Medical History  Diagnosis Date  . GERD (gastroesophageal reflux disease)   . Quadriplegia   . Hypertension   . Pressure ulcer of foot   . Chronic pain disorder   . Hyperlipidemia    Past Surgical History  Procedure Laterality Date  . Back surgery  08/2013  . Abdominal hysterectomy      FAMHx: Family History  Problem Relation Age of Onset  . Adopted: Yes    SOCHx:  reports that she has quit smoking. Her smoking use included Cigarettes. She smoked 0.25 packs per day. She has never used smokeless tobacco. She reports that she does not drink alcohol or use illicit drugs.  ALLERGIES: Allergies  Allergen Reactions  . Decadrol [Dexamethasone] Swelling  . Decadron [Dexamethasone] Swelling  . Sodium Phosphate Swelling    ROS: no cardiac complaints  HOME MEDICATIONS: Prescriptions prior to admission  Medication Sig Dispense Refill  . aspirin EC 81 MG tablet Take 81 mg by mouth every morning.       . baclofen (LIORESAL) 10 MG tablet Take 10 mg by mouth 3 (three) times daily.      . Choline Fenofibrate (TRILIPIX) 135 MG capsule Take 135 mg by mouth every morning.       . [EXPIRED] ciprofloxacin (CIPRO) 500 MG tablet Take 1 tablet (500 mg total) by mouth 2 (two) times daily.  20 tablet  0  . ezetimibe (ZETIA) 10 MG tablet Take 10 mg by mouth every morning.       . feeding supplement, ENSURE, (ENSURE) PUDG Take 1 Container by mouth 2 (two) times daily between meals.  60 Container  0  . gabapentin (NEURONTIN) 300 MG capsule Take 300 mg by mouth 3 (three) times daily.      Marland Kitchen LORazepam (ATIVAN) 1 MG tablet Take 1 tablet (1 mg total) by mouth every 12 (twelve) hours as needed for anxiety.  20 tablet  0  . magnesium oxide (MAG-OX) 400 MG tablet Take 400 mg by mouth 2 (two)  times daily.      . metolazone (ZAROXOLYN) 2.5 MG tablet Take 2.5 mg by mouth every morning.       . Multiple Vitamin (MULTIVITAMIN WITH MINERALS) TABS tablet Take 1 tablet by mouth every morning.       Marland Kitchen omeprazole (PRILOSEC) 20 MG capsule Take 20 mg by mouth every morning.       . OxyCODONE (OXYCONTIN) 20 mg T12A 12 hr tablet Take 40 mg by mouth 2 (two) times daily.       . polyethylene glycol (MIRALAX / GLYCOLAX) packet Take 17 g by mouth daily as needed for mild constipation.      . potassium chloride SA (K-DUR,KLOR-CON) 20 MEQ tablet Take 20 mEq by mouth 3 (three) times daily.      .  promethazine (PHENERGAN) 25 MG tablet Take 25 mg by mouth every 6 (six) hours as needed for nausea or vomiting.      . sodium phosphate (FLEET) enema Place 1 enema rectally daily as needed (constipation). follow package directions      . spironolactone (ALDACTONE) 25 MG tablet Take 25 mg by mouth 2 (two) times daily.      . tamsulosin (FLOMAX) 0.4 MG CAPS capsule Take 0.4 mg by mouth every morning.       . torsemide (DEMADEX) 20 MG tablet Take 20 mg by mouth every morning.       . traZODone (DESYREL) 50 MG tablet Take 50 mg by mouth at bedtime.      Marland Kitchen trifluoperazine (STELAZINE) 5 MG tablet Take 5 mg by mouth every morning.       . Vortioxetine HBr (BRINTELLIX) 10 MG TABS Take 10 mg by mouth daily.       Marland Kitchen zolpidem (AMBIEN) 5 MG tablet Take 5 mg by mouth at bedtime as needed for sleep.      . metFORMIN (GLUCOPHAGE) 500 MG tablet Take 500 mg by mouth daily with breakfast.         HOSPITAL MEDICATIONS: Prior to Admission:  Prescriptions prior to admission  Medication Sig Dispense Refill  . aspirin EC 81 MG tablet Take 81 mg by mouth every morning.       . baclofen (LIORESAL) 10 MG tablet Take 10 mg by mouth 3 (three) times daily.      . Choline Fenofibrate (TRILIPIX) 135 MG capsule Take 135 mg by mouth every morning.       . [EXPIRED] ciprofloxacin (CIPRO) 500 MG tablet Take 1 tablet (500 mg total) by mouth 2 (two) times daily.  20 tablet  0  . ezetimibe (ZETIA) 10 MG tablet Take 10 mg by mouth every morning.       . feeding supplement, ENSURE, (ENSURE) PUDG Take 1 Container by mouth 2 (two) times daily between meals.  60 Container  0  . gabapentin (NEURONTIN) 300 MG capsule Take 300 mg by mouth 3 (three) times daily.      Marland Kitchen LORazepam (ATIVAN) 1 MG tablet Take 1 tablet (1 mg total) by mouth every 12 (twelve) hours as needed for anxiety.  20 tablet  0  . magnesium oxide (MAG-OX) 400 MG tablet Take 400 mg by mouth 2 (two) times daily.      . metolazone (ZAROXOLYN) 2.5 MG tablet Take 2.5 mg by  mouth every morning.       . Multiple Vitamin (MULTIVITAMIN WITH MINERALS) TABS tablet Take 1 tablet by mouth every morning.       Marland Kitchen omeprazole (PRILOSEC) 20 MG capsule  Take 20 mg by mouth every morning.       . OxyCODONE (OXYCONTIN) 20 mg T12A 12 hr tablet Take 40 mg by mouth 2 (two) times daily.       . polyethylene glycol (MIRALAX / GLYCOLAX) packet Take 17 g by mouth daily as needed for mild constipation.      . potassium chloride SA (K-DUR,KLOR-CON) 20 MEQ tablet Take 20 mEq by mouth 3 (three) times daily.      . promethazine (PHENERGAN) 25 MG tablet Take 25 mg by mouth every 6 (six) hours as needed for nausea or vomiting.      . sodium phosphate (FLEET) enema Place 1 enema rectally daily as needed (constipation). follow package directions      . spironolactone (ALDACTONE) 25 MG tablet Take 25 mg by mouth 2 (two) times daily.      . tamsulosin (FLOMAX) 0.4 MG CAPS capsule Take 0.4 mg by mouth every morning.       . torsemide (DEMADEX) 20 MG tablet Take 20 mg by mouth every morning.       . traZODone (DESYREL) 50 MG tablet Take 50 mg by mouth at bedtime.      Marland Kitchen trifluoperazine (STELAZINE) 5 MG tablet Take 5 mg by mouth every morning.       . Vortioxetine HBr (BRINTELLIX) 10 MG TABS Take 10 mg by mouth daily.       Marland Kitchen zolpidem (AMBIEN) 5 MG tablet Take 5 mg by mouth at bedtime as needed for sleep.      . metFORMIN (GLUCOPHAGE) 500 MG tablet Take 500 mg by mouth daily with breakfast.        Scheduled: . antiseptic oral rinse  7 mL Mouth Rinse BID  . aspirin EC  81 mg Oral q morning - 10a  . baclofen  10 mg Oral TID  . cefTRIAXone (ROCEPHIN)  IV  1 g Intravenous Q24H  . enoxaparin (LOVENOX) injection  40 mg Subcutaneous Q24H  . erythromycin   Both Eyes 3 times per day  . ezetimibe  10 mg Oral q morning - 10a  . feeding supplement (ENSURE)  1 Container Oral BID BM  . gabapentin  300 mg Oral TID  . insulin aspart  0-9 Units Subcutaneous TID WC  . lactose free nutrition  237 mL Oral BID BM    . magnesium oxide  600 mg Oral BID  . OxyCODONE  40 mg Oral BID  . pantoprazole  40 mg Oral Daily  . potassium chloride SA  30 mEq Oral TID  . tamsulosin  0.4 mg Oral q morning - 10a  . traZODone  50 mg Oral QHS  . trifluoperazine  5 mg Oral q morning - 10a  . Vortioxetine HBr  10 mg Oral Daily    VITALS: Blood pressure 101/54, pulse 59, temperature 98.2 F (36.8 C), temperature source Oral, resp. rate 14, height  (1.575 m), weight 77.7 kg (171 lb 4.8 oz), SpO2 98.00%.  PHYSICAL EXAM:  General: Alert, oriented x3, no distress Head: no evidence of trauma, PERRL, EOMI, no exophtalmos or lid lag, no myxedema, no xanthelasma; normal ears, nose and oropharynx Neck: normal jugular venous pulsations and no hepatojugular reflux; brisk carotid pulses without delay and no carotid bruits Chest: clear to auscultation, no signs of consolidation by percussion or palpation, normal fremitus, symmetrical and full respiratory excursions Cardiovascular: normal position and quality of the apical impulse, regular rhythm, normal first heart sound and normal second heart sound, no  rubs or gallops, no murmur Abdomen: no tenderness or distention, no masses by palpation, no abnormal pulsatility or arterial bruits, normal bowel sounds, no hepatosplenomegaly Extremities: no clubbing, cyanosis;  no edema; 2+ radial, ulnar and brachial pulses bilaterally; 2+ right femoral, posterior tibial and dorsalis pedis pulses; 2+ left femoral, posterior tibial and dorsalis pedis pulses; no subclavian or femoral bruits Neurological: paraplegia, tremor   LABS  CBC  Recent Labs  03/28/14 0335 03/29/14 0300  WBC 10.2 10.4  HGB 11.1* 11.4*  HCT 32.5* 34.0*  MCV 82.1 82.7  PLT 377 354   Basic Metabolic Panel  Recent Labs  03/29/14 0300 03/30/14 0450  NA 137 138  K 3.5* 4.3  CL 96 99  CO2 30 28  GLUCOSE 155* 102*  BUN 8 7  CREATININE 0.48* 0.46*  CALCIUM 9.2 9.3  MG 1.6 1.7    ECG: NSR, long  QT  TELEMETRY: Artifact described above  IMPRESSION: 1. No evidence of atrial flutter 2. Suspect NSVT was also an artifact, but hard to say without the documentation 3. Long QT has improved  RECOMMENDATION: 1. Careful review of all 12 leads of the telemetry before making an arrhythmia diagnosis. The inferior leads (II, III, F) appear most severely affected by the tremor and should not be used for rhythm analysis. If rapid ventricular rates are reported by the monitor, confirm this by physical exam before providing any treatment 2. She is on neuroleptic agents that prolong the QT interval. Avoid other QT prolonging agents (e.g. Macrolides, quinolones, etc) and keep electolytes in normal range.    Thurmon Fair, MD, [facility] [phone] office [phone] pager   03/30/2014, 10:50 AM

## 2014-03-30 NOTE — Progress Notes (Signed)
Occupational Therapy Treatment Patient Details Name: [name] [name] MRN: [id number] DOB: 12-19-1945 Today's Date: 03/30/2014    History of present illness Pt is a 68yo female admitted with ftt, mental status changes and UTI.  Pt we quadriplegia for long term and was recently in hospital for sepsis due to klebsiella.  Pt states she has been bed bound for over a year.    OT comments  Pt tolerated bil. UE AAROM.  Feel she will benefit from continued OPOT at discharge.   Follow Up Recommendations  Outpatient OT;Supervision/Assistance - 24 hour    Equipment Recommendations  None recommended by OT    Recommendations for Other Services      Precautions / Restrictions Precautions Precautions: Fall Precaution Comments: Pt is accustomed to getting OOB with a lift.       Mobility Bed Mobility                  Transfers                      Balance                                   ADL                                         General ADL Comments: Pt instructed in AROM bil. UEs.  Strength bil. UEs grossly 3-/5      Vision                     Perception     Praxis      Cognition   Behavior During Therapy: Flat affect Overall Cognitive Status: No family/caregiver present to determine baseline cognitive functioning                       Extremity/Trunk Assessment               Exercises General Exercises - Upper Extremity Shoulder Flexion: AAROM;10 reps;Both;Supine Shoulder Extension: AAROM;Both;10 reps;Supine Shoulder ABduction: AAROM;Both;10 reps;Supine Shoulder ADduction: AAROM;Both;10 reps;Supine Shoulder Horizontal ABduction: AAROM;Both;10 reps;Supine Shoulder Horizontal ADduction: AAROM;Both;10 reps;Supine Elbow Flexion: AAROM;Both;10 reps;Supine Elbow Extension: AAROM;Both;10 reps;Supine Wrist Flexion: AAROM;Both;10 reps;Supine Wrist Extension: AAROM;Both;10 reps;Supine Digit Composite  Flexion: AAROM;Both;10 reps;Supine Composite Extension: AAROM;Both;10 reps   Shoulder Instructions       General Comments      Pertinent Vitals/ Pain       Pain Assessment: No/denies pain  Home Living                                          Prior Functioning/Environment              Frequency Min 2X/week     Progress Toward Goals  OT Goals(current goals can now be found in the care plan section)     ADL Goals Pt Will Perform Eating: with min assist;sitting;with adaptive utensils;with assist to don/doff brace/orthosis Pt Will Perform Grooming: with min assist;with adaptive equipment;sitting Pt/caregiver will Perform Home Exercise Program: Increased ROM;Both right and left upper extremity;With minimal assist;With written HEP provided  Plan Discharge plan remains appropriate    Co-evaluation  End of Session     Activity Tolerance Patient tolerated treatment well   Patient Left in bed;with call bell/phone within reach;with nursing/sitter in room   Nurse Communication          Time: 1308-6578 OT Time Calculation (min): 25 min  Charges: OT General Charges $OT Visit: 1 Procedure OT Treatments $Therapeutic Exercise: 23-37 mins  Adi Seales M 03/30/2014, 3:15 PM

## 2014-03-30 NOTE — Care Management Note (Addendum)
    Page 1 of 2   03/31/2014     3:38:11 PM CARE MANAGEMENT NOTE 03/31/2014  Patient:  [name] [name], [name] [name]   Account Number:  [id number]  Date Initiated:  03/27/2014  Documentation initiated by:  [name],[name]  Subjective/Objective Assessment:   pt from snf recently dcd after uti and sepsis/readmitted with elevated temp and lactic acid/ua clean this admission/bld cultures pending     Action/Plan:   snf when ready   Anticipated DC Date:  03/31/2014   Anticipated DC Plan:  HOME W HOME HEALTH SERVICES  In-house referral  Clinical Social Worker      DC Planning Services  CM consult      [facility] Choice  Resumption Of Svcs/PTA Provider   Choice offered to / List presented to:  NA   DME arranged  NA      DME agency  NA     HH arranged  NA      HH agency  [employer]   Status of service:  Completed, signed off Medicare Important Message given?  YES (If response is "NO", the following Medicare IM given date fields will be blank) Date Medicare IM given:  03/31/2014 Medicare IM given by:  [facility] Date Additional Medicare IM given:   Additional Medicare IM given by:    Discharge Disposition:  HOME W HOME HEALTH SERVICES  Per UR Regulation:  Reviewed for med. necessity/level of care/duration of stay  If discussed at Long Length of Stay Meetings, dates discussed:   03/30/2014    Comments:  03/31/14 [name] Deans RN,BSN NCM [phone] TC [name] WORKERS COMP CM-FAXED W/CONFIRMATION PERSONAL CARE SERVICES FOR RESUMPTION,H&P,D/C SUMMARY,PROGRESS NOTES.SHE WILL CALL FOR AMBULANCE TRANSP HOME.NO FURTHER D/C NEEDS.  03/30/14 [name] Bickle RN,BSN NCM [phone] TRANSFER FROM SDU.SEPSIS-CARDIAC ISSUES.QUAD.D/C PLAN RETURN HOME W/RESUMPTION OF 24/7 CARE FROM [facility].CONTACT [name](WORKERS COMP CM) [phone],WILL NEED TO FAX D/C SUMMARY, & RESUMPTION OF PERSONAL CARE SERVICES TO [name] FAX# [phone].  [id number]/[name] Davis,RN,BSN,CCM: [id number]  Davis,Rn,BSN,CCM: Patient is workers comp Investment banker, corporate is [name] at [phone]/ONGEXBM is a home bound patient and has 24 /7 care through [facility]. hx of fall at work-2004/has required multiple back surgries and is a parapelgic from spinal cord compression/

## 2014-03-31 DIAGNOSIS — E876 Hypokalemia: Secondary | ICD-10-CM

## 2014-03-31 DIAGNOSIS — G9341 Metabolic encephalopathy: Secondary | ICD-10-CM

## 2014-03-31 DIAGNOSIS — A419 Sepsis, unspecified organism: Principal | ICD-10-CM

## 2014-03-31 LAB — GLUCOSE, CAPILLARY
GLUCOSE-CAPILLARY: 115 mg/dL — AB (ref 70–99)
Glucose-Capillary: 100 mg/dL — ABNORMAL HIGH (ref 70–99)

## 2014-03-31 MED ORDER — CEPHALEXIN 250 MG PO CAPS: 250.0000 mg | ORAL_CAPSULE | Freq: Four times a day (QID) | ORAL | Status: AC

## 2014-03-31 MED ORDER — BOOST PLUS PO LIQD: 237.0000 mL | Freq: Two times a day (BID) | ORAL | Status: DC

## 2014-03-31 NOTE — Discharge Summary (Signed)
Physician Discharge Summary  [name] [name] KGM:[id number] DOB: 12-17-1945 DOA: 03/26/2014  PCP: [name], MD  Admit date: 03/26/2014 Discharge date: 03/31/2014  Time spent: 35 minutes  Recommendations for Outpatient Follow-up:  1. Discharged home with resumption of home health 2. Followup with PCP in one week. If patient has recurrent UTI symptoms may need chronic suppressive antibiotic therapy. 3. Patient is on multiple medications including antidepressants, oxycodone, lorazepam and Neurontin which would contribute and/or worsen her encephalopathy. Recommend outpatient titration of the medications.  Discharge Diagnoses:   Principal problem   Sepsis  Active Problems:   UTI (lower urinary tract infection)   Metabolic encephalopathy   Decubitus ulcer, heel   Hypokalemia   Paraplegia following spinal cord injury   Abnormal ECG   Discharge Condition: Fair  Diet recommendation: Regular  Filed Weights   03/27/14 0400 03/28/14 0400 03/29/14 0333  Weight: 73.9 kg (162 lb 14.7 oz) 76.1 kg (167 lb 12.3 oz) 77.7 kg (171 lb 4.8 oz)    History of present illness:  Please refer to admission H&P for details, but in brief,68 year old female with paraplegia following spinal cord injury, hypertension, recently hospitalized from 9/7 - 9/11 for sepsis secondary to Klebsiella, presented to [facility] ED with altered mental status and progressive failure to thrive. In ED, pt noted to be somewhat lethargic, BP soft 86/30, HR 50 - 60's. Blood work notable for WBC 12.2, K 3.3, Cr 1.2. No acute findings on abd/chest xray . Pt admitted to step down for sepsis.   Hospital Course:  Sepsis  - patient presented with hypotension, leukocytosis, encephalopathy and elevated lactic acid . no clear source of infection. -Recently had sepsis with klebsiella UTI and was discharge home on 2 weeks course fo abx. UTI may still be the source of sepsis, although UA has been negative. ( she was admitted in June 2015 for  micrococcus UTI as well)  -sepsis now resolved.  -Received empiric Vancomycin and Zosyn ( for 4 days) , blood and urine cx negative. switched to IV rocephin on 9/23 . Recent Klebsiella UTI was pansensitive and I will discharge her on oral Keflex for 5 more days to complete a ten-day course. -Patient clinically stable to be discharged home. If patient has recurrent UTI symptoms she may need to be placed on chronic suppressive antibiotic therapy or referred to 2 outpatient infectious disease.  Acute encephalopathy  - secondary to sepsis and dehydration . Patient is also on multiple medications including oxycodone, benzodiazepine, antidepressants and Neurontin which would all contribute/ exaggerate encephalopathy symptoms and medications need to be titrated as outpatient. -resolved.   ?NSVT on admission  Possibly artifact due to tremors. Tele monitor recorded on 9/23 as possible aflutter. Cardiology was consulted who reviewed 12 lead and suggested this was from tremors. has prolonged Qtc in 500s with PVCs on monitor. Repleted k and mg  - pt asymptomatic   Acute renal failure  - Mild and pre renal due to dehydration . Now resolved.  - Resume torsemide  anemia  Mild and stable   Chronic hypokalemia  Replenished.   Paraplegia, from spinal cord injury  - bed bound  - frequent turning, air matress   DM with neuropathy  - Resume metformin. Continue Neurontin.   Protein calorie malnutrition, moderate  - appreciate nutrition input . Patient on supplements at home  Decubitus ulcers, multiple sites  - Healing Stage III left heel: 0.2cm x 0.3cm x 0 area with healing skin and small black scabbed area  - Stage II  right buttock: 0.5cm x 0.5cm x 0.2cm  - Ulceration right inner thigh: 1.0cm x 5.0cm x 0.2cm  - Dressing rec's: Soft silicone foam dressings to protect and insulate the wounds, float heels at all times, air matress  - appreciate wound care team assistance    Code Status: Full   Family Communication: none at bedside  Disposition Plan: home with home health     Discharge Exam: Filed Vitals:   03/31/14 0846  BP: 130/63  Pulse: 81  Temp: 98.2 F (36.8 C)  Resp: 15    General: NAD  HEENT no pallor, moist mucosa  Cardiovascular: NS1&S2, no murmurs  Respiratory: clear b/l  Abdomen: soft, NT, ND, BS+  Musculoskeletal: warm, no edema  CNS: alert and oriented, quadrplegic    Discharge Instructions You were cared for by a hospitalist during your hospital stay. If you have any questions about your discharge medications or the care you received while you were in the hospital after you are discharged, you can call the unit and asked to speak with the hospitalist on call if the hospitalist that took care of you is not available. Once you are discharged, your primary care physician will handle any further medical issues. Please note that NO REFILLS for any discharge medications will be authorized once you are discharged, as it is imperative that you return to your primary care physician (or establish a relationship with a primary care physician if you do not have one) for your aftercare needs so that they can reassess your need for medications and monitor your lab values.   Current Discharge Medication List    START taking these medications   Details  cephALEXin (KEFLEX) 250 MG capsule Take 1 capsule (250 mg total) by mouth 4 (four) times daily. Qty: 20 capsule, Refills: 0 until 04/04/2014      CONTINUE these medications which have NOT CHANGED   Details  aspirin EC 81 MG tablet Take 81 mg by mouth every morning.     baclofen (LIORESAL) 10 MG tablet Take 10 mg by mouth 3 (three) times daily.    Choline Fenofibrate (TRILIPIX) 135 MG capsule Take 135 mg by mouth every morning.     ezetimibe (ZETIA) 10 MG tablet Take 10 mg by mouth every morning.     feeding supplement, ENSURE, (ENSURE) PUDG Take 1 Container by mouth 2 (two) times daily between meals. Qty: 60  Container, Refills: 0    gabapentin (NEURONTIN) 300 MG capsule Take 300 mg by mouth 3 (three) times daily.    LORazepam (ATIVAN) 1 MG tablet Take 1 tablet (1 mg total) by mouth every 12 (twelve) hours as needed for anxiety. Qty: 20 tablet, Refills: 0    magnesium oxide (MAG-OX) 400 MG tablet Take 400 mg by mouth 2 (two) times daily.    metolazone (ZAROXOLYN) 2.5 MG tablet Take 2.5 mg by mouth every morning.     Multiple Vitamin (MULTIVITAMIN WITH MINERALS) TABS tablet Take 1 tablet by mouth every morning.     omeprazole (PRILOSEC) 20 MG capsule Take 20 mg by mouth every morning.     OxyCODONE (OXYCONTIN) 20 mg T12A 12 hr tablet Take 40 mg by mouth 2 (two) times daily.     polyethylene glycol (MIRALAX / GLYCOLAX) packet Take 17 g by mouth daily as needed for mild constipation.    potassium chloride SA (K-DUR,KLOR-CON) 20 MEQ tablet Take 20 mEq by mouth 3 (three) times daily.    promethazine (PHENERGAN) 25 MG tablet Take 25 mg  by mouth every 6 (six) hours as needed for nausea or vomiting.    sodium phosphate (FLEET) enema Place 1 enema rectally daily as needed (constipation). follow package directions    spironolactone (ALDACTONE) 25 MG tablet Take 25 mg by mouth 2 (two) times daily.    tamsulosin (FLOMAX) 0.4 MG CAPS capsule Take 0.4 mg by mouth every morning.     torsemide (DEMADEX) 20 MG tablet Take 20 mg by mouth every morning.     traZODone (DESYREL) 50 MG tablet Take 50 mg by mouth at bedtime.    trifluoperazine (STELAZINE) 5 MG tablet Take 5 mg by mouth every morning.     Vortioxetine HBr (BRINTELLIX) 10 MG TABS Take 10 mg by mouth daily.     zolpidem (AMBIEN) 5 MG tablet Take 5 mg by mouth at bedtime as needed for sleep.    metFORMIN (GLUCOPHAGE) 500 MG tablet Take 500 mg by mouth daily with breakfast.       STOP taking these medications     ciprofloxacin (CIPRO) 500 MG tablet        Allergies  Allergen Reactions  . Decadrol [Dexamethasone] Swelling  .  Decadron [Dexamethasone] Swelling  . Sodium Phosphate Swelling   Follow-up Information   Follow up with [name], MD. Schedule an appointment as soon as possible for a visit in 1 week.   Specialty:  Family Medicine   Contact information:   [address] [place] [address] [phone]        The results of significant diagnostics from this hospitalization (including imaging, microbiology, ancillary and laboratory) are listed below for reference.    Significant Diagnostic Studies: Ct Head Wo Contrast  03/14/2014   CLINICAL DATA:  Altered mental status, fever  EXAM: CT HEAD WITHOUT CONTRAST  TECHNIQUE: Contiguous axial images were obtained from the base of the skull through the vertex without intravenous contrast.  COMPARISON:  03/13/2007  FINDINGS: No evidence of parenchymal hemorrhage or extra-axial fluid collection. No mass lesion, mass effect, or midline shift.  No CT evidence of acute infarction.  Mild subcortical white matter and periventricular small vessel ischemic changes. Intracranial atherosclerosis.  Cerebral volume is within normal limits.  No ventriculomegaly.  The visualized paranasal sinuses are essentially clear. The mastoid air cells are unopacified.  No evidence of calvarial fracture.  IMPRESSION: No evidence of acute intracranial abnormality.  Mild small vessel ischemic changes.   Electronically Signed   By: Charline Bills M.D.   On: 03/14/2014 01:23   US Abdomen Complete  03/16/2014   CLINICAL DATA:  Urinary retention.  EXAM: ULTRASOUND ABDOMEN COMPLETE  COMPARISON:  Ultrasound abdomen 03/30/2013  FINDINGS: Gallbladder:  Gallbladder wall thickening is mild measuring 4.3 mm. Negative sonographic Murphy sign. No gallstones.  Common bile duct:  Diameter: 4.3 mm  Liver:  Fatty infiltration of liver without focal mass lesion.  IVC:  No abnormality visualized.  Pancreas:  Visualized portion unremarkable.  Spleen:  Size and appearance within normal limits.  Right Kidney:   Length: 11.1 cm. Echogenicity within normal limits. No mass or hydronephrosis visualized.  Left Kidney:  Length: 10.6 cm. Echogenicity within normal limits. No mass or hydronephrosis visualized.  Abdominal aorta:  No aneurysm visualized.  Other findings:  None  IMPRESSION: Mild gallbladder wall thickening without gallstones or focal pain over the gallbladder.  Hepatic steatosis.   Electronically Signed   By: Marlan Palau M.D.   On: 03/16/2014 16:26   Dg Chest Port 1 View  (if Code Sepsis Called)  03/13/2014   CLINICAL DATA:  Fever, altered mental status  EXAM: PORTABLE CHEST - 1 VIEW  COMPARISON:  03/16/2007  FINDINGS: Lungs are clear.  No pleural effusion or pneumothorax.  Mild cardiomegaly.  IMPRESSION: No evidence of acute cardiopulmonary disease.   Electronically Signed   By: Charline Bills M.D.   On: 03/13/2014 21:44   Dg Abd Acute W/chest  03/26/2014   CLINICAL DATA:  Abdominal swelling, lethargy and fatigue.  EXAM: ACUTE ABDOMEN SERIES (ABDOMEN 2 VIEW & CHEST 1 VIEW)  COMPARISON:  Portable chest dated 03/13/2014 and previous abdomen ultrasound and CT examinations.  FINDINGS: The cardiac silhouette remains borderline enlarged. Stable linear scarring at the medial left lung base. Otherwise, clear lungs. Normal bowel gas pattern without free peritoneal air. Mildly prominent stool. Right lower quadrant abdominal surgical clips. Lumbar spine degenerative changes and mild scoliosis.  IMPRESSION: No acute abnormality.  Mildly prominent stool.   Electronically Signed   By: Gordan Payment M.D.   On: 03/26/2014 13:02    Microbiology: Recent Results (from the past 240 hour(s))  URINE CULTURE     Status: None   Collection Time    03/26/14 12:12 PM      Result Value Ref Range Status   Specimen Description URINE, CATHETERIZED   Final   Special Requests NONE   Final   Culture  Setup Time     Final   Value: 03/26/2014 19:11     Performed at [facility] Count     Final   Value: NO  GROWTH     Performed at [facility]   Culture     Final   Value: NO GROWTH     Performed at [facility]   Report Status 03/27/2014 FINAL   Final  CULTURE, BLOOD (ROUTINE X 2)     Status: None   Collection Time    03/26/14 12:25 PM      Result Value Ref Range Status   Specimen Description BLOOD BLOOD LEFT FOREARM   Final   Special Requests BOTTLES DRAWN AEROBIC AND ANAEROBIC   Final   Culture  Setup Time     Final   Value: 03/26/2014 18:13     Performed at [facility]   Culture     Final   Value:        BLOOD CULTURE RECEIVED NO GROWTH TO DATE CULTURE WILL BE HELD FOR 5 DAYS BEFORE ISSUING A FINAL NEGATIVE REPORT     Performed at [facility]   Report Status PENDING   Incomplete  CULTURE, BLOOD (ROUTINE X 2)     Status: None   Collection Time    03/26/14 12:25 PM      Result Value Ref Range Status   Specimen Description BLOOD LEFT ANTECUBITAL   Final   Special Requests BOTTLES DRAWN AEROBIC AND ANAEROBIC   Final   Culture  Setup Time     Final   Value: 03/26/2014 18:13     Performed at [facility]   Culture     Final   Value:        BLOOD CULTURE RECEIVED NO GROWTH TO DATE CULTURE WILL BE HELD FOR 5 DAYS BEFORE ISSUING A FINAL NEGATIVE REPORT     Performed at [facility]   Report Status PENDING   Incomplete  MRSA PCR SCREENING     Status: Abnormal   Collection Time    03/26/14  3:56  PM      Result Value Ref Range Status   MRSA by PCR INVALID RESULTS, SPECIMEN SENT FOR CULTURE (*) NEGATIVE Final   Comment: SPOKE WITH DENNY,S RN [phone] COVINGTON,N                The GeneXpert MRSA Assay (FDA     approved for NASAL specimens     only), is one component of a     comprehensive MRSA colonization     surveillance program. It is not     intended to diagnose MRSA     infection nor to guide or     monitor treatment for     MRSA infections.  MRSA CULTURE     Status: None   Collection Time    03/26/14  3:56 PM       Result Value Ref Range Status   Specimen Description NASOPHARYNGEAL   Final   Special Requests NONE   Final   Culture     Final   Value: NO STAPHYLOCOCCUS AUREUS ISOLATED     Note: NOMRSA     Performed at [facility]   Report Status 03/29/2014 FINAL   Final     Labs: Basic Metabolic Panel:  Recent Labs Lab 03/26/14 1225 03/26/14 1238 03/27/14 0340 03/28/14 0335 03/29/14 0300 03/30/14 0450  NA  --  135* 137 136* 137 138  K  --  3.3* 3.8 3.2* 3.5* 4.3  CL  --  97 94* 95* 96 99  CO2  --   --  29 28 30 28   GLUCOSE  --  153* 105* 121* 155* 102*  BUN  --  33* 26* 14 8 7   CREATININE  --  1.20* 0.78 0.50 0.48* 0.46*  CALCIUM  --   --  8.9 8.8 9.2 9.3  MG 1.9  --   --   --  1.6 1.7  PHOS 3.2  --   --   --   --   --    Liver Function Tests: No results found for this basename: AST, ALT, ALKPHOS, BILITOT, PROT, ALBUMIN,  in the last 168 hours No results found for this basename: LIPASE, AMYLASE,  in the last 168 hours No results found for this basename: AMMONIA,  in the last 168 hours CBC:  Recent Labs Lab 03/26/14 1225 03/26/14 1238 03/27/14 0340 03/28/14 0335 03/29/14 0300  WBC 12.2*  --  11.4* 10.2 10.4  NEUTROABS 7.2  --   --   --   --   HGB 12.9 14.3 11.9* 11.1* 11.4*  HCT 38.6 42.0 36.5 32.5* 34.0*  MCV 83.0  --  84.5 82.1 82.7  PLT 496*  --  453* 377 354   Cardiac Enzymes: No results found for this basename: CKTOTAL, CKMB, CKMBINDEX, TROPONINI,  in the last 168 hours BNP: BNP (last 3 results) No results found for this basename: PROBNP,  in the last 8760 hours CBG:  Recent Labs Lab 03/29/14 2114 03/30/14 0743 03/30/14 1138 03/30/14 1646 03/31/14 0725  GLUCAP 160* 107* 145* 112* 100*       Signed:  Camry Robello  [facility] 03/31/2014, 9:58 AM

## 2014-03-31 NOTE — Discharge Instructions (Signed)

## 2014-04-01 LAB — CULTURE, BLOOD (ROUTINE X 2)
CULTURE: NO GROWTH
Culture: NO GROWTH

## 2014-04-03 ENCOUNTER — Ambulatory Visit: Payer: Self-pay | Admitting: Physical Therapy

## 2014-04-03 LAB — GLUCOSE, CAPILLARY: Glucose-Capillary: 109 mg/dL — ABNORMAL HIGH (ref 70–99)

## 2014-04-06 ENCOUNTER — Ambulatory Visit: Payer: Self-pay | Admitting: Physical Therapy

## 2014-04-15 ENCOUNTER — Encounter (HOSPITAL_COMMUNITY): Payer: Self-pay | Admitting: Emergency Medicine

## 2014-04-15 ENCOUNTER — Inpatient Hospital Stay (HOSPITAL_COMMUNITY)
Admission: EM | Admit: 2014-04-15 | Discharge: 2014-04-18 | DRG: 871 | Disposition: A | Discharge status: Home / Self Care | Payer: Medicare Other | Attending: Family Medicine | Admitting: Family Medicine

## 2014-04-15 DIAGNOSIS — A419 Sepsis, unspecified organism: Secondary | ICD-10-CM

## 2014-04-15 DIAGNOSIS — Z87891 Personal history of nicotine dependence: Secondary | ICD-10-CM

## 2014-04-15 DIAGNOSIS — E785 Hyperlipidemia, unspecified: Secondary | ICD-10-CM | POA: Diagnosis present

## 2014-04-15 DIAGNOSIS — I10 Essential (primary) hypertension: Secondary | ICD-10-CM | POA: Diagnosis present

## 2014-04-15 DIAGNOSIS — E860 Dehydration: Secondary | ICD-10-CM | POA: Diagnosis present

## 2014-04-15 DIAGNOSIS — R4182 Altered mental status, unspecified: Secondary | ICD-10-CM | POA: Diagnosis present

## 2014-04-15 DIAGNOSIS — G8220 Paraplegia, unspecified: Secondary | ICD-10-CM

## 2014-04-15 DIAGNOSIS — E119 Type 2 diabetes mellitus without complications: Secondary | ICD-10-CM

## 2014-04-15 DIAGNOSIS — N390 Urinary tract infection, site not specified: Secondary | ICD-10-CM | POA: Diagnosis present

## 2014-04-15 DIAGNOSIS — Z7982 Long term (current) use of aspirin: Secondary | ICD-10-CM

## 2014-04-15 DIAGNOSIS — E876 Hypokalemia: Secondary | ICD-10-CM | POA: Diagnosis present

## 2014-04-15 DIAGNOSIS — I739 Peripheral vascular disease, unspecified: Secondary | ICD-10-CM | POA: Diagnosis present

## 2014-04-15 DIAGNOSIS — R404 Transient alteration of awareness: Secondary | ICD-10-CM

## 2014-04-15 DIAGNOSIS — Z6828 Body mass index (BMI) 28.0-28.9, adult: Secondary | ICD-10-CM

## 2014-04-15 DIAGNOSIS — K219 Gastro-esophageal reflux disease without esophagitis: Secondary | ICD-10-CM | POA: Diagnosis present

## 2014-04-15 DIAGNOSIS — G9340 Encephalopathy, unspecified: Secondary | ICD-10-CM | POA: Diagnosis present

## 2014-04-15 DIAGNOSIS — E43 Unspecified severe protein-calorie malnutrition: Secondary | ICD-10-CM | POA: Diagnosis present

## 2014-04-15 DIAGNOSIS — L89623 Pressure ulcer of left heel, stage 3: Secondary | ICD-10-CM | POA: Diagnosis present

## 2014-04-15 DIAGNOSIS — E118 Type 2 diabetes mellitus with unspecified complications: Secondary | ICD-10-CM

## 2014-04-15 DIAGNOSIS — G629 Polyneuropathy, unspecified: Secondary | ICD-10-CM

## 2014-04-15 DIAGNOSIS — I959 Hypotension, unspecified: Secondary | ICD-10-CM

## 2014-04-15 NOTE — ED Notes (Signed)
Bed: [id number] Expected date:  Expected time:  Means of arrival:  Comments: EMS altered Mental status - UTI

## 2014-04-15 NOTE — ED Notes (Signed)
Per EMS, pt. From home who as reported of altered mental status  By husband which was noted at around 0900pm this evening, pt. Is diagnosed with UTI by her PCP and started on antibiotic. Pt. Is paraplegic and nonverbal per EMS. Husband reported to EMS that every time pt. Has UTI , pt. Has altered mentation. Temp. 99.'F  per EMS.

## 2014-04-16 ENCOUNTER — Emergency Department (HOSPITAL_COMMUNITY): Payer: Medicare Other

## 2014-04-16 DIAGNOSIS — E119 Type 2 diabetes mellitus without complications: Secondary | ICD-10-CM

## 2014-04-16 DIAGNOSIS — E43 Unspecified severe protein-calorie malnutrition: Secondary | ICD-10-CM | POA: Diagnosis present

## 2014-04-16 DIAGNOSIS — I739 Peripheral vascular disease, unspecified: Secondary | ICD-10-CM | POA: Diagnosis present

## 2014-04-16 DIAGNOSIS — E860 Dehydration: Secondary | ICD-10-CM | POA: Diagnosis present

## 2014-04-16 DIAGNOSIS — A419 Sepsis, unspecified organism: Secondary | ICD-10-CM | POA: Diagnosis present

## 2014-04-16 DIAGNOSIS — G8220 Paraplegia, unspecified: Secondary | ICD-10-CM | POA: Diagnosis present

## 2014-04-16 DIAGNOSIS — G629 Polyneuropathy, unspecified: Secondary | ICD-10-CM | POA: Diagnosis present

## 2014-04-16 DIAGNOSIS — Z87891 Personal history of nicotine dependence: Secondary | ICD-10-CM | POA: Diagnosis not present

## 2014-04-16 DIAGNOSIS — N390 Urinary tract infection, site not specified: Secondary | ICD-10-CM | POA: Diagnosis present

## 2014-04-16 DIAGNOSIS — E876 Hypokalemia: Secondary | ICD-10-CM | POA: Diagnosis present

## 2014-04-16 DIAGNOSIS — Z6828 Body mass index (BMI) 28.0-28.9, adult: Secondary | ICD-10-CM | POA: Diagnosis not present

## 2014-04-16 DIAGNOSIS — Z7982 Long term (current) use of aspirin: Secondary | ICD-10-CM | POA: Diagnosis not present

## 2014-04-16 DIAGNOSIS — I10 Essential (primary) hypertension: Secondary | ICD-10-CM | POA: Diagnosis present

## 2014-04-16 DIAGNOSIS — R4182 Altered mental status, unspecified: Secondary | ICD-10-CM | POA: Diagnosis present

## 2014-04-16 DIAGNOSIS — L89623 Pressure ulcer of left heel, stage 3: Secondary | ICD-10-CM | POA: Diagnosis present

## 2014-04-16 DIAGNOSIS — E785 Hyperlipidemia, unspecified: Secondary | ICD-10-CM | POA: Diagnosis present

## 2014-04-16 DIAGNOSIS — E118 Type 2 diabetes mellitus with unspecified complications: Secondary | ICD-10-CM

## 2014-04-16 DIAGNOSIS — G9340 Encephalopathy, unspecified: Secondary | ICD-10-CM | POA: Diagnosis present

## 2014-04-16 DIAGNOSIS — K219 Gastro-esophageal reflux disease without esophagitis: Secondary | ICD-10-CM | POA: Diagnosis present

## 2014-04-16 LAB — URINALYSIS, ROUTINE W REFLEX MICROSCOPIC
BILIRUBIN URINE: NEGATIVE
Bilirubin Urine: NEGATIVE
GLUCOSE, UA: NEGATIVE mg/dL
Glucose, UA: NEGATIVE mg/dL
Hgb urine dipstick: NEGATIVE
Hgb urine dipstick: NEGATIVE
KETONES UR: NEGATIVE mg/dL
Ketones, ur: NEGATIVE mg/dL
LEUKOCYTES UA: NEGATIVE
LEUKOCYTES UA: NEGATIVE
Nitrite: NEGATIVE
Nitrite: NEGATIVE
PH: 6.5 (ref 5.0–8.0)
PROTEIN: NEGATIVE mg/dL
Protein, ur: NEGATIVE mg/dL
SPECIFIC GRAVITY, URINE: 1.016 (ref 1.005–1.030)
Specific Gravity, Urine: 1.012 (ref 1.005–1.030)
UROBILINOGEN UA: 0.2 mg/dL (ref 0.0–1.0)
Urobilinogen, UA: 0.2 mg/dL (ref 0.0–1.0)
pH: 7 (ref 5.0–8.0)

## 2014-04-16 LAB — COMPREHENSIVE METABOLIC PANEL
ALT: 18 U/L (ref 0–35)
ANION GAP: 16 — AB (ref 5–15)
AST: 22 U/L (ref 0–37)
Albumin: 3.4 g/dL — ABNORMAL LOW (ref 3.5–5.2)
Alkaline Phosphatase: 81 U/L (ref 39–117)
BILIRUBIN TOTAL: 0.2 mg/dL — AB (ref 0.3–1.2)
BUN: 16 mg/dL (ref 6–23)
CALCIUM: 9 mg/dL (ref 8.4–10.5)
CO2: 24 mEq/L (ref 19–32)
CREATININE: 0.65 mg/dL (ref 0.50–1.10)
Chloride: 95 mEq/L — ABNORMAL LOW (ref 96–112)
GFR calc non Af Amer: 89 mL/min — ABNORMAL LOW (ref >90)
Glucose, Bld: 113 mg/dL — ABNORMAL HIGH (ref 70–99)
Potassium: 3 mEq/L — ABNORMAL LOW (ref 3.7–5.3)
Sodium: 135 mEq/L — ABNORMAL LOW (ref 137–147)
Total Protein: 6.7 g/dL (ref 6.0–8.3)

## 2014-04-16 LAB — GLUCOSE, CAPILLARY
GLUCOSE-CAPILLARY: 101 mg/dL — AB (ref 70–99)
Glucose-Capillary: 112 mg/dL — ABNORMAL HIGH (ref 70–99)
Glucose-Capillary: 118 mg/dL — ABNORMAL HIGH (ref 70–99)
Glucose-Capillary: 112 mg/dL — ABNORMAL HIGH (ref 70–99)

## 2014-04-16 LAB — MRSA PCR SCREENING: MRSA by PCR: NEGATIVE

## 2014-04-16 LAB — CBC
HEMATOCRIT: 39 % (ref 36.0–46.0)
Hemoglobin: 13.2 g/dL (ref 12.0–15.0)
MCH: 27.8 pg (ref 26.0–34.0)
MCHC: 33.8 g/dL (ref 30.0–36.0)
MCV: 82.1 fL (ref 78.0–100.0)
PLATELETS: 248 10*3/uL (ref 150–400)
RBC: 4.75 MIL/uL (ref 3.87–5.11)
RDW: 12.9 % (ref 11.5–15.5)
WBC: 11.4 10*3/uL — ABNORMAL HIGH (ref 4.0–10.5)

## 2014-04-16 LAB — PROCALCITONIN: Procalcitonin: <0.1 ng/mL

## 2014-04-16 LAB — LACTIC ACID, PLASMA
LACTIC ACID, VENOUS: 3.9 mmol/L — AB (ref 0.5–2.2)
LACTIC ACID, VENOUS: 5.1 mmol/L — AB (ref 0.5–2.2)

## 2014-04-16 LAB — MAGNESIUM: Magnesium: 1.6 mg/dL (ref 1.5–2.5)

## 2014-04-16 MED ORDER — GABAPENTIN 300 MG PO CAPS
300.0000 mg | ORAL_CAPSULE | Freq: Three times a day (TID) | ORAL | Status: DC
Start: 2014-04-16 — End: 2014-04-18
  Administered 2014-04-16 – 2014-04-18 (×7): 300 mg via ORAL
  Filled 2014-04-16 (×7): qty 1

## 2014-04-16 MED ORDER — POLYETHYLENE GLYCOL 3350 17 G PO PACK
17.0000 g | PACK | Freq: Every day | ORAL | Status: DC | PRN
Start: 2014-04-16 — End: 2014-04-18

## 2014-04-16 MED ORDER — OXYCODONE HCL ER 20 MG PO T12A
40.0000 mg | EXTENDED_RELEASE_TABLET | Freq: Two times a day (BID) | ORAL | Status: DC
  Administered 2014-04-16: 40 mg via ORAL
  Filled 2014-04-16 (×2): qty 2

## 2014-04-16 MED ORDER — ENOXAPARIN SODIUM 40 MG/0.4ML ~~LOC~~ SOLN
40.0000 mg | SUBCUTANEOUS | Status: DC
  Administered 2014-04-16 – 2014-04-18 (×3): 40 mg via SUBCUTANEOUS
  Filled 2014-04-16 (×3): qty 0.4

## 2014-04-16 MED ORDER — ASPIRIN EC 81 MG PO TBEC
81.0000 mg | DELAYED_RELEASE_TABLET | Freq: Every morning | ORAL | Status: DC
  Administered 2014-04-16 – 2014-04-18 (×3): 81 mg via ORAL
  Filled 2014-04-16 (×3): qty 1

## 2014-04-16 MED ORDER — POTASSIUM CHLORIDE 10 MEQ/100ML IV SOLN
10.0000 meq | INTRAVENOUS | Status: AC
  Administered 2014-04-16 (×4): 10 meq via INTRAVENOUS
  Filled 2014-04-16 (×4): qty 100

## 2014-04-16 MED ORDER — SODIUM CHLORIDE 0.9 % IV BOLUS (SEPSIS)
1000.0000 mL | Freq: Once | INTRAVENOUS | Status: AC
  Administered 2014-04-16: 1000 mL via INTRAVENOUS

## 2014-04-16 MED ORDER — VANCOMYCIN HCL IN DEXTROSE 1-5 GM/200ML-% IV SOLN
1000.0000 mg | Freq: Once | INTRAVENOUS | Status: AC
  Administered 2014-04-16: 1000 mg via INTRAVENOUS
  Filled 2014-04-16: qty 200

## 2014-04-16 MED ORDER — ENSURE PUDDING PO PUDG
1.0000 | Freq: Two times a day (BID) | ORAL | Status: DC
Start: 2014-04-16 — End: 2014-04-18
  Administered 2014-04-16 – 2014-04-18 (×4): 1 via ORAL
  Filled 2014-04-16 (×6): qty 1

## 2014-04-16 MED ORDER — SODIUM CHLORIDE 0.9 % IV SOLN
INTRAVENOUS | Status: AC
  Administered 2014-04-16: 50 mL/h via INTRAVENOUS

## 2014-04-16 MED ORDER — TRAZODONE HCL 50 MG PO TABS
50.0000 mg | ORAL_TABLET | Freq: Every day | ORAL | Status: DC
  Administered 2014-04-16: 50 mg via ORAL
  Filled 2014-04-16: qty 1

## 2014-04-16 MED ORDER — EZETIMIBE 10 MG PO TABS
10.0000 mg | ORAL_TABLET | Freq: Every morning | ORAL | Status: DC
  Administered 2014-04-16 – 2014-04-18 (×3): 10 mg via ORAL
  Filled 2014-04-16 (×3): qty 1

## 2014-04-16 MED ORDER — BACLOFEN 10 MG PO TABS
10.0000 mg | ORAL_TABLET | Freq: Three times a day (TID) | ORAL | Status: DC
Start: 2014-04-16 — End: 2014-04-18
  Administered 2014-04-16 – 2014-04-18 (×7): 10 mg via ORAL
  Filled 2014-04-16 (×7): qty 1

## 2014-04-16 MED ORDER — TAMSULOSIN HCL 0.4 MG PO CAPS
0.4000 mg | ORAL_CAPSULE | Freq: Every morning | ORAL | Status: DC
  Administered 2014-04-16 – 2014-04-18 (×3): 0.4 mg via ORAL
  Filled 2014-04-16 (×3): qty 1

## 2014-04-16 MED ORDER — LORAZEPAM 1 MG PO TABS: 1.0000 mg | ORAL_TABLET | Freq: Two times a day (BID) | ORAL | Status: DC | PRN

## 2014-04-16 MED ORDER — PROMETHAZINE HCL 25 MG PO TABS: 25.0000 mg | ORAL_TABLET | Freq: Four times a day (QID) | ORAL | Status: DC | PRN

## 2014-04-16 MED ORDER — ZOLPIDEM TARTRATE 5 MG PO TABS: 5.0000 mg | ORAL_TABLET | Freq: Every evening | ORAL | Status: DC | PRN

## 2014-04-16 MED ORDER — METFORMIN HCL 500 MG PO TABS
500.0000 mg | ORAL_TABLET | Freq: Every day | ORAL | Status: DC
  Administered 2014-04-16: 500 mg via ORAL
  Filled 2014-04-16 (×2): qty 1

## 2014-04-16 MED ORDER — VORTIOXETINE HBR 10 MG PO TABS
10.0000 mg | ORAL_TABLET | Freq: Every day | ORAL | Status: DC
  Administered 2014-04-16 – 2014-04-18 (×3): 10 mg via ORAL
  Filled 2014-04-16 (×3): qty 1

## 2014-04-16 MED ORDER — MAGNESIUM OXIDE 400 (241.3 MG) MG PO TABS
400.0000 mg | ORAL_TABLET | Freq: Two times a day (BID) | ORAL | Status: DC
  Administered 2014-04-16 – 2014-04-18 (×5): 400 mg via ORAL
  Filled 2014-04-16 (×5): qty 1

## 2014-04-16 MED ORDER — VANCOMYCIN HCL IN DEXTROSE 1-5 GM/200ML-% IV SOLN
1000.0000 mg | Freq: Two times a day (BID) | INTRAVENOUS | Status: DC
  Administered 2014-04-16 – 2014-04-18 (×4): 1000 mg via INTRAVENOUS
  Filled 2014-04-16 (×4): qty 200

## 2014-04-16 MED ORDER — TRIFLUOPERAZINE HCL 5 MG PO TABS
5.0000 mg | ORAL_TABLET | Freq: Every morning | ORAL | Status: DC
  Administered 2014-04-16: 5 mg via ORAL
  Filled 2014-04-16 (×2): qty 1

## 2014-04-16 MED ORDER — INSULIN ASPART 100 UNIT/ML ~~LOC~~ SOLN
0.0000 [IU] | Freq: Three times a day (TID) | SUBCUTANEOUS | Status: DC
  Administered 2014-04-17 (×2): 1 [IU] via SUBCUTANEOUS
  Administered 2014-04-18: 2 [IU] via SUBCUTANEOUS

## 2014-04-16 MED ORDER — ADULT MULTIVITAMIN W/MINERALS CH
1.0000 | ORAL_TABLET | Freq: Every morning | ORAL | Status: DC
  Administered 2014-04-16 – 2014-04-18 (×3): 1 via ORAL
  Filled 2014-04-16 (×3): qty 1

## 2014-04-16 MED ORDER — PIPERACILLIN-TAZOBACTAM 3.375 G IVPB 30 MIN
3.3750 g | Freq: Once | INTRAVENOUS | Status: AC
  Administered 2014-04-16: 3.375 g via INTRAVENOUS
  Filled 2014-04-16: qty 50

## 2014-04-16 MED ORDER — PANTOPRAZOLE SODIUM 40 MG PO TBEC
40.0000 mg | DELAYED_RELEASE_TABLET | Freq: Every day | ORAL | Status: DC
  Administered 2014-04-16 – 2014-04-18 (×3): 40 mg via ORAL
  Filled 2014-04-16 (×3): qty 1

## 2014-04-16 MED ORDER — PIPERACILLIN-TAZOBACTAM 3.375 G IVPB
3.3750 g | Freq: Three times a day (TID) | INTRAVENOUS | Status: DC
  Administered 2014-04-16 – 2014-04-18 (×7): 3.375 g via INTRAVENOUS
  Filled 2014-04-16 (×7): qty 50

## 2014-04-16 MED ORDER — POTASSIUM CHLORIDE CRYS ER 20 MEQ PO TBCR
20.0000 meq | EXTENDED_RELEASE_TABLET | Freq: Three times a day (TID) | ORAL | Status: DC
  Administered 2014-04-16 – 2014-04-18 (×7): 20 meq via ORAL
  Filled 2014-04-16 (×7): qty 1

## 2014-04-16 MED ORDER — FENOFIBRATE 54 MG PO TABS
54.0000 mg | ORAL_TABLET | Freq: Every day | ORAL | Status: DC
  Administered 2014-04-16 – 2014-04-18 (×3): 54 mg via ORAL
  Filled 2014-04-16 (×3): qty 1

## 2014-04-16 NOTE — Progress Notes (Signed)
Heart monitor alarm ring out  SVT HR 160's -200's upon assessment patient denies chest discomfort or SOB.  Applied EKG to patient to capture irregular rhythm. EKG reads muscle tremor HR 88. Will continue to monitor patient.

## 2014-04-16 NOTE — H&P (Signed)
[facility] History and Physical  [name] MeekerLinda [name] ZOX:[id number] DOB: [birth year] DOA: 04/15/2014  Referring physician: ED physician PCP: [name], Samuel, MD   Chief Complaint: Altered mental status  HPI:  68 year old female with paraplegia following spinal cord injury, hypertension, recently hospitalized from 9/7 - 9/11 and again 9/20 - 9/25 for sepsis secondary to Klebsiella UTI, now [facility] with altered mental status and progressive failure to thrive, lethargy. Please note that patient is unable to provide any history at, the admission do to altered mental state, no family at bedside available at the time of admission. Most of the information obtained from available records and nurse practitioner who saw patient while son was in the room. Son reported fevers as high as 101 Fahrenheit, poor oral intake, minimal responsiveness for the past 24-48 hours. No reported abdominal concerns, no reported chest pain or shortness of breath.  In ED, pt noted to be somnolent but easy to arouse, opens eyes with verbal stimuli. Vital signs: Temperature 100.3 F, BP 96/43, pulse 58. Blood work notable for WBC 11.4, potassium 3.0, lactic acid greater than 3.5, otherwise unremarkable. Patient given one dose of vancomycin and Zosyn and started on IV fluids for treatment of presumptive sepsis. [facility] asked to admit for further evaluation .  Assessment and Plan:  Active Problems:  Sepsis  - pt satisfies criteria for sepsis given hypotension, leukocytosis, elevated lactic acid  - source is unclear, last admission it was determined that Klebsiella from urinary source caused bacteremia  - will admit to SDU  - place on Vanc and Zosyn, continue IVF  - urine and blood culture requested  - due to hypotension, will hold Torsemide, Spironolactone, Metolazone  Acute encephalopathy  - secondary to principal problem, dehydration, FTT - IVF and ABX as noted above  - PT/OT evaluation once pt  more medically stable  Hypokalemia  - mild, supplemented, repeat BMP in AM DM with complications of neuropathy  - placed on SSI for now  - continue Metformin per home medical regimen  Hypotension  - hold torsemide, spironolactone, Metolazone  - gentle hydration for now to avoid volume overload  Severe PCM - secondary to acute on chronic illnesses outlined above - advance diet as pt able to tolerate  Paraplegia, from spinal cord injury  - bed bound  - frequent turning, air matress  Decubitus ulcers, multiple sites  - Healing Stage III left heel: 0.2cm x 0.3cm x 0 area with healing skin and small black scabbed area  - Stage II right buttock: 0.5cm x 0.5cm x 0.2cm  - Ulceration right inner thigh: 1.0cm x 5.0cm x 0.2cm    Radiological Exams on Admission: Ct Head Wo Contrast  04/16/2014   CLINICAL DATA:  Acute onset of altered mental status. Initial encounter.  EXAM: CT HEAD WITHOUT CONTRAST  TECHNIQUE: Contiguous axial images were obtained from the base of the skull through the vertex without intravenous contrast.  COMPARISON:  CT of the head performed 03/14/2014  FINDINGS: There is no evidence of acute infarction, mass lesion, or intra- or extra-axial hemorrhage on CT.  Prominence of the ventricles and sulci reflects mild cortical volume loss. Mild cerebellar atrophy is noted. Mild periventricular white matter change likely reflects small vessel ischemic microangiopathy.  The brainstem and fourth ventricle are within normal limits. The basal ganglia are unremarkable in appearance. The cerebral hemispheres demonstrate grossly normal gray-white differentiation. No mass effect or midline shift is seen.  There is no evidence of fracture; visualized osseous structures  are unremarkable in appearance. The visualized portions of the orbits are within normal limits. The paranasal sinuses and mastoid air cells are well-aerated. No significant soft tissue abnormalities are seen.  IMPRESSION: 1. No acute  intracranial pathology seen on CT. 2. Mild cortical volume loss and scattered small vessel ischemic microangiopathy.   Electronically Signed   By: [name]  Chang M.D.   On: 04/16/2014 04:07   Dg Chest Portable 1 View  04/16/2014   CLINICAL DATA:  Acute onset altered mental status. Initial encounter.  EXAM: PORTABLE CHEST - 1 VIEW  COMPARISON:  Chest radiograph from 03/26/2014  FINDINGS: The lungs are hypoexpanded. Vascular crowding is noted. Minimal bilateral atelectasis is seen. There is no evidence of pleural effusion or pneumothorax.  The cardiomediastinal silhouette is borderline enlarged. No acute osseous abnormalities are seen.  IMPRESSION: Lungs hypoexpanded, with minimal bilateral atelectasis. Borderline cardiomegaly noted.   Electronically Signed   By: [name]  Chang M.D.   On: 04/16/2014 01:48     Code Status: Full Family Communication: No family at bedside  Disposition Plan: Admit for further evaluation     Review of Systems:  Unable to obtain due to AMS    Past Medical History  Diagnosis Date  . GERD (gastroesophageal reflux disease)   . Quadriplegia   . Hypertension   . Pressure ulcer of foot   . Chronic pain disorder   . Hyperlipidemia     Past Surgical History  Procedure Laterality Date  . Back surgery  08/2013  . Abdominal hysterectomy      Social History:  reports that she has quit smoking. Her smoking use included Cigarettes. She smoked 0.25 packs per day. She has never used smokeless tobacco. She reports that she does not drink alcohol or use illicit drugs.  Allergies  Allergen Reactions  . Decadrol [Dexamethasone] Swelling  . Decadron [Dexamethasone] Swelling  . Sodium Phosphate Swelling    Family History  Problem Relation Age of Onset  . Adopted: Yes    Prior to Admission medications   Medication Sig Start Date End Date Taking? Authorizing Provider  aspirin EC 81 MG tablet Take 81 mg by mouth every morning.    Yes Historical Provider, MD  baclofen  (LIORESAL) 10 MG tablet Take 10 mg by mouth 3 (three) times daily.   Yes Historical Provider, MD  Choline Fenofibrate (TRILIPIX) 135 MG capsule Take 135 mg by mouth every morning.    Yes Historical Provider, MD  ezetimibe (ZETIA) 10 MG tablet Take 10 mg by mouth every morning.    Yes Historical Provider, MD  feeding supplement, ENSURE, (ENSURE) PUDG Take 1 Container by mouth 2 (two) times daily between meals. 03/17/14  Yes Nishant Dhungel, MD  gabapentin (NEURONTIN) 300 MG capsule Take 300 mg by mouth 3 (three) times daily.   Yes Historical Provider, MD  LORazepam (ATIVAN) 1 MG tablet Take 1 tablet (1 mg total) by mouth every 12 (twelve) hours as needed for anxiety. 03/17/14  Yes Nishant Dhungel, MD  magnesium oxide (MAG-OX) 400 MG tablet Take 400 mg by mouth 2 (two) times daily.   Yes Historical Provider, MD  metFORMIN (GLUCOPHAGE) 500 MG tablet Take 500 mg by mouth daily with breakfast.    Yes Historical Provider, MD  Multiple Vitamin (MULTIVITAMIN WITH MINERALS) TABS tablet Take 1 tablet by mouth every morning.    Yes Historical Provider, MD  omeprazole (PRILOSEC) 20 MG capsule Take 20 mg by mouth every morning.    Yes Historical Provider, MD  OxyCODONE (  OXYCONTIN) 20 mg T12A 12 hr tablet Take 40 mg by mouth 2 (two) times daily.    Yes Historical Provider, MD  polyethylene glycol (MIRALAX / GLYCOLAX) packet Take 17 g by mouth daily as needed for mild constipation.   Yes Historical Provider, MD  potassium chloride SA (K-DUR,KLOR-CON) 20 MEQ tablet Take 20 mEq by mouth 3 (three) times daily.   Yes Historical Provider, MD  spironolactone (ALDACTONE) 25 MG tablet Take 25 mg by mouth 2 (two) times daily.   Yes Historical Provider, MD  tamsulosin (FLOMAX) 0.4 MG CAPS capsule Take 0.4 mg by mouth every morning.    Yes Historical Provider, MD  torsemide (DEMADEX) 20 MG tablet Take 20 mg by mouth every morning.    Yes Historical Provider, MD  traZODone (DESYREL) 50 MG tablet Take 50 mg by mouth at bedtime.    Yes Historical Provider, MD  trifluoperazine (STELAZINE) 5 MG tablet Take 5 mg by mouth every morning.    Yes Historical Provider, MD  Vortioxetine HBr (BRINTELLIX) 10 MG TABS Take 10 mg by mouth daily.    Yes Historical Provider, MD  zolpidem (AMBIEN) 5 MG tablet Take 5 mg by mouth at bedtime as needed for sleep.   Yes Historical Provider, MD  metolazone (ZAROXOLYN) 2.5 MG tablet Take 2.5 mg by mouth every morning.     Historical Provider, MD  promethazine (PHENERGAN) 25 MG tablet Take 25 mg by mouth every 6 (six) hours as needed for nausea or vomiting.    Historical Provider, MD    Physical Exam: Filed Vitals:   04/16/14 0230 04/16/14 0300 04/16/14 0330 04/16/14 0400  BP: 101/43 99/49 105/45 96/43  Pulse: 65 64 63 61  Temp:      TempSrc:      Resp: 10 12 11 11   SpO2: 96% 97% 100% 99%    Physical Exam  Constitutional: Appears somnolent but easy to arouse with verbal stimuli  HENT: Normocephalic. External right and left ear normal. Dry MM Eyes: Conjunctivae and EOM are normal. PERRLA, no scleral icterus.  Neck: Normal ROM. Neck supple. No JVD. No tracheal deviation. No thyromegaly.  CVS: RRR, S1/S2 + no gallops, no carotid bruit.  Pulmonary: Effort and breath sounds normal, no stridor, diminished breath sounds at bases Abdominal: Soft. BS +,  no distension, tenderness, rebound or guarding.  Musculoskeletal: No edema and no tenderness.  Lymphadenopathy: No lymphadenopathy noted, cervical, inguinal. Neuro: Paraplegic, somnolent as noted above  Skin: Skin is warm and dry. No rash noted. Not diaphoretic. Psychiatric: Difficult to assess due to AMS  Labs on Admission:  Basic Metabolic Panel:  Recent Labs Lab 04/16/14 0046  NA 135*  K 3.0*  CL 95*  CO2 24  GLUCOSE 113*  BUN 16  CREATININE 0.65  CALCIUM 9.0   Liver Function Tests:  Recent Labs Lab 04/16/14 0046  AST 22  ALT 18  ALKPHOS 81  BILITOT 0.2*  PROT 6.7  ALBUMIN 3.4*   CBC:  Recent Labs Lab  04/16/14 0046  WBC 11.4*  HGB 13.2  HCT 39.0  MCV 82.1  PLT 248   EKG: Normal sinus rhythm, no ST/T wave changes  Debbora Presto, MD  [facility] Pager [phone]  If 7PM-7AM, please contact night-coverage [email or web address] Password [email or web address] 04/16/2014, 5:04 AM

## 2014-04-16 NOTE — ED Provider Notes (Signed)
CSN: [id number]     Arrival date & time 04/15/14  2339 History   First MD Initiated Contact with Patient 04/15/14 2347     Chief Complaint  Patient presents with  . Altered Mental Status  . Urinary Tract Infection     (Consider location/radiation/quality/duration/timing/severity/associated sxs/prior Treatment) HPI Comments: Patient Dx with UTI yesterday started on antibiotics tonight unresponsive, diaphoretic, febrile Son called EMS for transport  Patient lives at home with 24 hour CNA care.  Tonight was found foaming at the mouth.   Patient is a 68 y.o. female presenting with altered mental status and urinary tract infection. The history is provided by the spouse and the EMS personnel. The history is limited by the condition of the patient.  Altered Mental Status Presenting symptoms: lethargy   Severity:  Severe Most recent episode:  Today Timing:  Constant Progression:  Worsening Chronicity:  Recurrent Context: recent illness and recent infection   Associated symptoms: fever   Urinary Tract Infection This is a recurrent problem. The current episode started yesterday. The problem has been unchanged. Associated symptoms include diaphoresis and a fever. The treatment provided no relief.    Past Medical History  Diagnosis Date  . GERD (gastroesophageal reflux disease)   . Quadriplegia   . Hypertension   . Pressure ulcer of foot   . Chronic pain disorder   . Hyperlipidemia    Past Surgical History  Procedure Laterality Date  . Back surgery  08/2013  . Abdominal hysterectomy     Family History  Problem Relation Age of Onset  . Adopted: Yes   History  Substance Use Topics  . Smoking status: Former Smoker -- 0.25 packs/day    Types: Cigarettes  . Smokeless tobacco: Never Used  . Alcohol Use: No   OB History   Grav Para Term Preterm Abortions TAB SAB Ect Mult Living                 Review of Systems  Unable to perform ROS: Acuity of condition  Constitutional:  Positive for fever and diaphoresis.  All other systems reviewed and are negative.     Allergies  Decadrol; Decadron; and Sodium phosphate  Home Medications   Prior to Admission medications   Medication Sig Start Date End Date Taking? Authorizing Provider  aspirin EC 81 MG tablet Take 81 mg by mouth every morning.    Yes Historical Provider, MD  baclofen (LIORESAL) 10 MG tablet Take 10 mg by mouth 3 (three) times daily.   Yes Historical Provider, MD  Choline Fenofibrate (TRILIPIX) 135 MG capsule Take 135 mg by mouth every morning.    Yes Historical Provider, MD  ezetimibe (ZETIA) 10 MG tablet Take 10 mg by mouth every morning.    Yes Historical Provider, MD  feeding supplement, ENSURE, (ENSURE) PUDG Take 1 Container by mouth 2 (two) times daily between meals. 03/17/14  Yes Nishant Dhungel, MD  gabapentin (NEURONTIN) 300 MG capsule Take 300 mg by mouth 3 (three) times daily.   Yes Historical Provider, MD  LORazepam (ATIVAN) 1 MG tablet Take 1 tablet (1 mg total) by mouth every 12 (twelve) hours as needed for anxiety. 03/17/14  Yes Nishant Dhungel, MD  magnesium oxide (MAG-OX) 400 MG tablet Take 400 mg by mouth 2 (two) times daily.   Yes Historical Provider, MD  metFORMIN (GLUCOPHAGE) 500 MG tablet Take 500 mg by mouth daily with breakfast.    Yes Historical Provider, MD  Multiple Vitamin (MULTIVITAMIN WITH MINERALS) TABS tablet Take  1 tablet by mouth every morning.    Yes Historical Provider, MD  omeprazole (PRILOSEC) 20 MG capsule Take 20 mg by mouth every morning.    Yes Historical Provider, MD  OxyCODONE (OXYCONTIN) 20 mg T12A 12 hr tablet Take 40 mg by mouth 2 (two) times daily.    Yes Historical Provider, MD  polyethylene glycol (MIRALAX / GLYCOLAX) packet Take 17 g by mouth daily as needed for mild constipation.   Yes Historical Provider, MD  potassium chloride SA (K-DUR,KLOR-CON) 20 MEQ tablet Take 20 mEq by mouth 3 (three) times daily.   Yes Historical Provider, MD  spironolactone  (ALDACTONE) 25 MG tablet Take 25 mg by mouth 2 (two) times daily.   Yes Historical Provider, MD  tamsulosin (FLOMAX) 0.4 MG CAPS capsule Take 0.4 mg by mouth every morning.    Yes Historical Provider, MD  torsemide (DEMADEX) 20 MG tablet Take 20 mg by mouth every morning.    Yes Historical Provider, MD  traZODone (DESYREL) 50 MG tablet Take 50 mg by mouth at bedtime.   Yes Historical Provider, MD  trifluoperazine (STELAZINE) 5 MG tablet Take 5 mg by mouth every morning.    Yes Historical Provider, MD  Vortioxetine HBr (BRINTELLIX) 10 MG TABS Take 10 mg by mouth daily.    Yes Historical Provider, MD  zolpidem (AMBIEN) 5 MG tablet Take 5 mg by mouth at bedtime as needed for sleep.   Yes Historical Provider, MD  metolazone (ZAROXOLYN) 2.5 MG tablet Take 2.5 mg by mouth every morning.     Historical Provider, MD  promethazine (PHENERGAN) 25 MG tablet Take 25 mg by mouth every 6 (six) hours as needed for nausea or vomiting.    Historical Provider, MD   BP 96/43  Pulse 61  Temp(Src) 100.3 F (37.9 C) (Rectal)  Resp 11  SpO2 99% Physical Exam  Vitals reviewed. Constitutional: She appears well-developed and well-nourished.  HENT:  Head: Normocephalic.  Eyes: Pupils are equal, round, and reactive to light.  Cardiovascular: Normal rate and regular rhythm.   Pulmonary/Chest: Effort normal and breath sounds normal. No respiratory distress.  Abdominal: Soft. She exhibits no distension. There is no tenderness.  Musculoskeletal:  quadroplegia  Skin: Skin is warm. No rash noted. She is diaphoretic. There is pallor.    ED Course  Procedures (including critical care time) Labs Review Labs Reviewed  CBC - Abnormal; Notable for the following:    WBC 11.4 (*)    All other components within normal limits  COMPREHENSIVE METABOLIC PANEL - Abnormal; Notable for the following:    Sodium 135 (*)    Potassium 3.0 (*)    Chloride 95 (*)    Glucose, Bld 113 (*)    Albumin 3.4 (*)    Total Bilirubin 0.2  (*)    GFR calc non Af Amer 89 (*)    Anion gap 16 (*)    All other components within normal limits  LACTIC ACID, PLASMA - Abnormal; Notable for the following:    Lactic Acid, Venous 3.9 (*)    All other components within normal limits  CULTURE, BLOOD (ROUTINE X 2)  CULTURE, BLOOD (ROUTINE X 2)  URINE CULTURE  URINALYSIS, ROUTINE W REFLEX MICROSCOPIC    Imaging Review Ct Head Wo Contrast  04/16/2014   CLINICAL DATA:  Acute onset of altered mental status. Initial encounter.  EXAM: CT HEAD WITHOUT CONTRAST  TECHNIQUE: Contiguous axial images were obtained from the base of the skull through the vertex without intravenous  contrast.  COMPARISON:  CT of the head performed 03/14/2014  FINDINGS: There is no evidence of acute infarction, mass lesion, or intra- or extra-axial hemorrhage on CT.  Prominence of the ventricles and sulci reflects mild cortical volume loss. Mild cerebellar atrophy is noted. Mild periventricular white matter change likely reflects small vessel ischemic microangiopathy.  The brainstem and fourth ventricle are within normal limits. The basal ganglia are unremarkable in appearance. The cerebral hemispheres demonstrate grossly normal gray-white differentiation. No mass effect or midline shift is seen.  There is no evidence of fracture; visualized osseous structures are unremarkable in appearance. The visualized portions of the orbits are within normal limits. The paranasal sinuses and mastoid air cells are well-aerated. No significant soft tissue abnormalities are seen.  IMPRESSION: 1. No acute intracranial pathology seen on CT. 2. Mild cortical volume loss and scattered small vessel ischemic microangiopathy.   Electronically Signed   By: [name]  Chang M.D.   On: 04/16/2014 04:07   Dg Chest Portable 1 View  04/16/2014   CLINICAL DATA:  Acute onset altered mental status. Initial encounter.  EXAM: PORTABLE CHEST - 1 VIEW  COMPARISON:  Chest radiograph from 03/26/2014  FINDINGS: The  lungs are hypoexpanded. Vascular crowding is noted. Minimal bilateral atelectasis is seen. There is no evidence of pleural effusion or pneumothorax.  The cardiomediastinal silhouette is borderline enlarged. No acute osseous abnormalities are seen.  IMPRESSION: Lungs hypoexpanded, with minimal bilateral atelectasis. Borderline cardiomegaly noted.   Electronically Signed   By: [name]  Chang M.D.   On: 04/16/2014 01:48     EKG Interpretation   Date/Time:  Sunday April 16 2014 00:11:01 EDT Ventricular Rate:  67 PR Interval:  176 QRS Duration: 79 QT Interval:  462 QTC Calculation: 488 R Axis:   18 Text Interpretation:  Sinus rhythm Probable left atrial enlargement  Abnormal R-wave progression, early transition Confirmed by HORTON  MD,  Toni AmendOURTNEY ([id number]) on 04/16/2014 1:42:56 AM     Discusses code status with Son-- full code  MDM   Final diagnoses:  Sepsis, due to unspecified organism  Hypotensive episode  Hypokalemia         Arman FilterGail K Vernella Niznik, NP 04/16/14 0430

## 2014-04-16 NOTE — Progress Notes (Signed)
[facility] PROGRESS NOTE  Ninfa MeekerLinda Daquila ZOX:[id number] DOB: [birth year] DOA: 04/15/2014 PCP: [name], Samuel, MD  Assessment/Plan:  Sepsis  Improving, patient was admitted with hypotension, leukocytosis and with elevated lactic acid. Started on vancomycin and Zosyn, urine culture and blood cultures obtained. At this time patient's blood pressure has improved she is more alert oriented x3. Will continue with the antibiotics and follow the culture results.  Acute encephalopathy  Resolved Likely due to the sepsis, continue the antibiotics  DM with complications of neuropathy  Started on sliding scale insulin with NovoLog. I will hold the metformin at this time.  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   Paraplegia, from spinal cord injury  Patient is bedbound due to paraplegia, will get a mattress,     Decubitus ulcers, multiple sites  - Healing Stage III left heel: 0.2cm x 0.3cm x 0 area with healing skin and small black scabbed area  - Stage II right buttock: 0.5cm x 0.5cm x 0.2cm  - Ulceration right inner thigh: 1.0cm x 5.0cm x 0.2cm    DVT prophylaxis Lovenox   Code Status: *Full code Family Communication: *Unable to contact the son on phone Disposition Plan: *TBD   Consultants:  None  Procedures:  None  Antibiotics:  Zosyn 10/11  Vancomycin 10/11   HPI/Subjective: 68 year old female with paraplegia following spinal cord injury, hypertension, recently hospitalized from 9/7 - 9/11 and again 9/20 - 9/25 for sepsis secondary to Klebsiella UTI, now [facility] with altered mental status and progressive failure to thrive, lethargy. Please note that patient is unable to provide any history at, the admission do to altered mental state, no  family at bedside available at the time of admission. Most of the information obtained from available records and nurse practitioner who saw patient while son was in the room. Son reported fevers as high as 101 Fahrenheit, poor oral intake, minimal responsiveness for the past 24-48 hours. No reported abdominal concerns, no reported chest pain or shortness of breath  Patient seen and examined this morning,  Alert, answering all questions appropriately.  Objective: Filed Vitals:   04/16/14 0900  BP: 116/43  Pulse: 67  Temp:   Resp: 14    Intake/Output Summary (Last 24 hours) at 04/16/14 1205 Last data filed at 04/16/14 0852  Gross per 24 hour  Intake   1750 ml  Output      0 ml  Net   1750 ml   Filed Weights   04/16/14 0510 04/16/14 0645  Weight: 77.7 kg (171 lb 4.8 oz) 69.6 kg (153 lb 7 oz)    Exam:  Physical Exam: Head: Normocephalic, atraumatic.  Eyes: No signs of jaundice, EOMI Lungs: Normal respiratory effort. B/L Clear to auscultation, no crackles or wheezes.  Heart: Regular RR. S1 and S2 normal  Abdomen: BS normoactive. Soft, Nondistended, non-tender.  Extremities: No pretibial edema, no erythema  Data Reviewed: Basic Metabolic Panel:  Recent Labs Lab 04/16/14 0046 04/16/14 0540  NA 135*  --   K 3.0*  --   CL 95*  --   CO2 24  --   GLUCOSE 113*  --   BUN 16  --   CREATININE 0.65  --   CALCIUM 9.0  --   MG  --  1.6   Liver Function Tests:  Recent Labs Lab 04/16/14 0046  AST 22  ALT 18  ALKPHOS 81  BILITOT 0.2*  PROT 6.7  ALBUMIN 3.4*   No results found for this basename: LIPASE, AMYLASE,  in the last 168 hours No results found for this basename: AMMONIA,  in the last 168 hours CBC:  Recent Labs Lab 04/16/14 0046  WBC 11.4*  HGB 13.2  HCT 39.0  MCV 82.1  PLT 248   Cardiac Enzymes: No results found for this basename: CKTOTAL, CKMB, CKMBINDEX, TROPONINI,  in the last 168 hours BNP (last 3 results) No results found for this basename:  PROBNP,  in the last 8760 hours CBG:  Recent Labs Lab 04/16/14 0818  GLUCAP 112*    Recent Results (from the past 240 hour(s))  MRSA PCR SCREENING     Status: None   Collection Time    04/16/14  6:47 AM      Result Value Ref Range Status   MRSA by PCR NEGATIVE  NEGATIVE Final   Comment:            The GeneXpert MRSA Assay (FDA     approved for NASAL specimens     only), is one component of a     comprehensive MRSA colonization     surveillance program. It is not     intended to diagnose MRSA     infection nor to guide or     monitor treatment for     MRSA infections.     Studies: Ct Head Wo Contrast  04/16/2014   CLINICAL DATA:  Acute onset of altered mental status. Initial encounter.  EXAM: CT HEAD WITHOUT CONTRAST  TECHNIQUE: Contiguous axial images were obtained from the base of the skull through the vertex without intravenous contrast.  COMPARISON:  CT of the head performed 03/14/2014  FINDINGS: There is no evidence of acute infarction, mass lesion, or intra- or extra-axial hemorrhage on CT.  Prominence of the ventricles and sulci reflects mild cortical volume loss. Mild cerebellar atrophy is noted. Mild periventricular white matter change likely reflects small vessel ischemic microangiopathy.  The brainstem and fourth ventricle are within normal limits. The basal ganglia are unremarkable in appearance. The cerebral hemispheres demonstrate grossly normal gray-white differentiation. No mass effect or midline shift is seen.  There is no evidence of fracture; visualized osseous structures are unremarkable in appearance. The visualized portions of the orbits are within normal limits. The paranasal sinuses and mastoid air cells are well-aerated. No significant soft tissue abnormalities are seen.  IMPRESSION: 1. No acute intracranial pathology seen on CT. 2. Mild cortical volume loss and scattered small vessel ischemic microangiopathy.   Electronically Signed   By: [name]  Chang M.D.    On: 04/16/2014 04:07   Dg Chest Portable 1 View  04/16/2014   CLINICAL DATA:  Acute onset altered mental status. Initial encounter.  EXAM: PORTABLE CHEST - 1 VIEW  COMPARISON:  Chest radiograph from 03/26/2014  FINDINGS: The lungs are hypoexpanded. Vascular crowding is noted. Minimal bilateral atelectasis is seen. There is no evidence of pleural effusion or pneumothorax.  The cardiomediastinal silhouette is borderline enlarged. No acute osseous abnormalities are seen.  IMPRESSION: Lungs hypoexpanded, with minimal bilateral atelectasis. Borderline cardiomegaly noted.   Electronically Signed   By: [name]  Chang M.D.   On: 04/16/2014 01:48    Scheduled Meds: . aspirin EC  81 mg Oral q morning - 10a  . baclofen  10 mg Oral TID  . enoxaparin (LOVENOX) injection  40  mg Subcutaneous Q24H  . ezetimibe  10 mg Oral q morning - 10a  . feeding supplement (ENSURE)  1 Container Oral BID BM  . fenofibrate  54 mg Oral Daily  . gabapentin  300 mg Oral TID  . insulin aspart  0-9 Units Subcutaneous TID WC  . magnesium oxide  400 mg Oral BID  . metFORMIN  500 mg Oral Q breakfast  . multivitamin with minerals  1 tablet Oral q morning - 10a  . OxyCODONE  40 mg Oral BID  . pantoprazole  40 mg Oral Daily  . piperacillin-tazobactam (ZOSYN)  IV  3.375 g Intravenous Q8H  . potassium chloride SA  20 mEq Oral TID  . tamsulosin  0.4 mg Oral q morning - 10a  . traZODone  50 mg Oral QHS  . trifluoperazine  5 mg Oral q morning - 10a  . vancomycin  1,000 mg Intravenous Q12H  . Vortioxetine HBr  10 mg Oral Daily   Continuous Infusions: . sodium chloride 50 mL/hr (04/16/14 0615)    Active Problems:   Altered mental state    Time spent: 25 min    [facility]  [facility] Pager [phone]*. If 7PM-7AM, please contact night-coverage at [email or web address], password [facility] 04/16/2014, 12:05 PM  LOS: 1 day

## 2014-04-16 NOTE — ED Notes (Signed)
NP at bedside.

## 2014-04-16 NOTE — Progress Notes (Signed)
ANTIBIOTIC CONSULT NOTE - INITIAL  Pharmacy Consult for Vancomycin & Zosyn Indication: Sepsis  Allergies  Allergen Reactions  . Decadrol [Dexamethasone] Swelling  . Decadron [Dexamethasone] Swelling  . Sodium Phosphate Swelling    Patient Measurements: Height: 5\' 2"  (157.5 cm) Weight: 171 lb 4.8 oz (77.7 kg) IBW/kg (Calculated) : 50.1  Vital Signs: Temp: 100.3 F (37.9 C) (10/11 0003) Temp Source: Rectal (10/11 0003) BP: 100/44 mmHg (10/11 0500) Pulse Rate: 57 (10/11 0500) Intake/Output from previous day: 10/10 0701 - 10/11 0700 In: 1100 [I.V.:1100] Out: -  Intake/Output from this shift: Total I/O In: 1100 [I.V.:1100] Out: -   Labs:  Recent Labs  04/16/14 0046  WBC 11.4*  HGB 13.2  PLT 248  CREATININE 0.65   Estimated Creatinine Clearance: 64 ml/min (by C-G formula based on Cr of 0.65). No results found for this basename: VANCOTROUGH, VANCOPEAK, VANCORANDOM, GENTTROUGH, GENTPEAK, GENTRANDOM, TOBRATROUGH, TOBRAPEAK, TOBRARND, AMIKACINPEAK, AMIKACINTROU, AMIKACIN,  in the last 72 hours   Microbiology: Recent Results (from the past 720 hour(s))  URINE CULTURE     Status: None   Collection Time    03/26/14 12:12 PM      Result Value Ref Range Status   Specimen Description URINE, CATHETERIZED   Final   Special Requests NONE   Final   Culture  Setup Time     Final   Value: 03/26/2014 19:11     Performed at [facility]   Colony Count     Final   Value: NO GROWTH     [facility]formed at [facility]   Culture     Final   Value: NO GROWTH     Performed at [facility]   Report Status 03/27/2014 FINAL   Final  CULTURE, BLOOD (ROUTINE X 2)     Status: None   Collection Time    03/26/14 12:25 PM      Result Value Ref Range Status   Specimen Description BLOOD BLOOD LEFT FOREARM   Final   Special Requests BOTTLES DRAWN AEROBIC AND ANAEROBIC 5ML   Final   Culture  Setup Time     Final   Value: 03/26/2014 18:13     [facility]formed at [facility]   Culture     Final   Value: NO GROWTH 5 DAYS     Performed at [facility]   Report Status 04/01/2014 FINAL   Final  CULTURE, BLOOD (ROUTINE X 2)     Status: None   Collection Time    03/26/14 12:25 PM      Result Value Ref Range Status   Specimen Description BLOOD LEFT ANTECUBITAL   Final   Special Requests BOTTLES DRAWN AEROBIC AND ANAEROBIC 5ML   Final   Culture  Setup Time     Final   Value: 03/26/2014 18:13     Performed at [facility]   Culture     Final   Value: NO GROWTH 5 DAYS     Performed at [facility]   Report Status 04/01/2014 FINAL   Final  MRSA PCR SCREENING     Status: Abnormal   Collection Time    03/26/14  3:56 PM      Result Value Ref Range Status   MRSA by PCR INVALID RESULTS, SPECIMEN SENT FOR CULTURE (*) NEGATIVE Final   Comment: SPOKE WITH DENNY,S RN 1940 [id number] COVINGTON,N                The  GeneXpert MRSA Assay (FDA     approved for NASAL specimens     only), is one component of a     comprehensive MRSA colonization     surveillance program. It is not     intended to diagnose MRSA     infection nor to guide or     monitor treatment for     MRSA infections.  MRSA CULTURE     Status: None   Collection Time    03/26/14  3:56 PM      Result Value Ref Range Status   Specimen Description NASOPHARYNGEAL   Final   Special Requests NONE   Final   Culture     Final   Value: NO STAPHYLOCOCCUS AUREUS ISOLATED     Note: NOMRSA     Performed at [facility]   Report Status 03/29/2014 FINAL   Final    Medical History: Past Medical History  Diagnosis Date  . GERD (gastroesophageal reflux disease)   . Quadriplegia   . Hypertension   . Pressure ulcer of foot   . Chronic pain disorder   . Hyperlipidemia     Medications:  Scheduled:  . insulin aspart  0-9 Units Subcutaneous TID WC   Infusions:  . potassium chloride 10 mEq (04/16/14 0426)   Assessment:  [age] yr female with UTI presents with altered  mental status  Blood and urine cultures ordered  CrCl ~ 64 ml/min  IV Vancomycin & Zosyn per pharmacy dosing for sepsis.  Goal of Therapy:  Vancomycin trough level 15-20 mcg/ml  Plan:  Measure antibiotic drug levels at steady state Follow up culture results Zosyn 3.375gm IV q8h (each dose infused over 4 hrs) Vancomycin 1000mg  IV q12h  [facility], Joselyn GlassmanLeann Trefz, PharmD 04/16/2014,5:17 AM

## 2014-04-16 NOTE — ED Notes (Addendum)
Attempted to call pt.'s son to inform of pt's status and room# on the floor, unavailable. Tel # [phone].

## 2014-04-17 LAB — URINE CULTURE
Colony Count: NO GROWTH
Culture: NO GROWTH

## 2014-04-17 LAB — BASIC METABOLIC PANEL
Anion gap: 16 — ABNORMAL HIGH (ref 5–15)
BUN: 6 mg/dL (ref 6–23)
CALCIUM: 9 mg/dL (ref 8.4–10.5)
CO2: 23 meq/L (ref 19–32)
Chloride: 99 mEq/L (ref 96–112)
Creatinine, Ser: 0.42 mg/dL — ABNORMAL LOW (ref 0.50–1.10)
GFR calc Af Amer: >90 mL/min (ref >90)
GFR calc non Af Amer: >90 mL/min (ref >90)
Glucose, Bld: 117 mg/dL — ABNORMAL HIGH (ref 70–99)
Potassium: 3.2 mEq/L — ABNORMAL LOW (ref 3.7–5.3)
Sodium: 138 mEq/L (ref 137–147)

## 2014-04-17 LAB — GLUCOSE, CAPILLARY
GLUCOSE-CAPILLARY: 128 mg/dL — AB (ref 70–99)
GLUCOSE-CAPILLARY: 147 mg/dL — AB (ref 70–99)
Glucose-Capillary: 104 mg/dL — ABNORMAL HIGH (ref 70–99)

## 2014-04-17 LAB — PROCALCITONIN: Procalcitonin: <0.1 ng/mL

## 2014-04-17 LAB — CBC
HEMATOCRIT: 36.5 % (ref 36.0–46.0)
HEMOGLOBIN: 12.4 g/dL (ref 12.0–15.0)
MCH: 27.8 pg (ref 26.0–34.0)
MCHC: 34 g/dL (ref 30.0–36.0)
MCV: 81.8 fL (ref 78.0–100.0)
Platelets: 262 10*3/uL (ref 150–400)
RBC: 4.46 MIL/uL (ref 3.87–5.11)
RDW: 13.1 % (ref 11.5–15.5)
WBC: 9 10*3/uL (ref 4.0–10.5)

## 2014-04-17 LAB — LACTIC ACID, PLASMA: Lactic Acid, Venous: 2.7 mmol/L — ABNORMAL HIGH (ref 0.5–2.2)

## 2014-04-17 MED ORDER — CHLORHEXIDINE GLUCONATE 0.12 % MT SOLN
15.0000 mL | Freq: Two times a day (BID) | OROMUCOSAL | Status: DC
  Administered 2014-04-17: 15 mL via OROMUCOSAL
  Filled 2014-04-17: qty 15

## 2014-04-17 MED ORDER — SODIUM CHLORIDE 0.9 % IV SOLN: INTRAVENOUS | Status: DC

## 2014-04-17 MED ORDER — LORAZEPAM 0.5 MG PO TABS: 0.5000 mg | ORAL_TABLET | Freq: Two times a day (BID) | ORAL | Status: DC | PRN

## 2014-04-17 MED ORDER — CETYLPYRIDINIUM CHLORIDE 0.05 % MT LIQD
7.0000 mL | Freq: Two times a day (BID) | OROMUCOSAL | Status: DC
  Administered 2014-04-17 – 2014-04-18 (×2): 7 mL via OROMUCOSAL

## 2014-04-17 MED ORDER — CETYLPYRIDINIUM CHLORIDE 0.05 % MT LIQD
7.0000 mL | Freq: Two times a day (BID) | OROMUCOSAL | Status: DC
Start: 2014-04-17 — End: 2014-04-17
  Administered 2014-04-17: 7 mL via OROMUCOSAL

## 2014-04-17 MED ORDER — OXYCODONE HCL ER 20 MG PO T12A
20.0000 mg | EXTENDED_RELEASE_TABLET | Freq: Two times a day (BID) | ORAL | Status: DC
  Administered 2014-04-17 – 2014-04-18 (×3): 20 mg via ORAL
  Filled 2014-04-17 (×3): qty 1

## 2014-04-17 MED ORDER — BOOST PLUS PO LIQD
237.0000 mL | Freq: Three times a day (TID) | ORAL | Status: DC
  Administered 2014-04-18: 237 mL via ORAL
  Filled 2014-04-17 (×3): qty 237

## 2014-04-17 NOTE — Progress Notes (Signed)
Pt extremely difficult to wake up this morning upon initial assessment.  It took 15 minutes to wake the patient, MD notified.  After reviewing home meds, adjustments made per MD to decrease sedatives.  Son updated about new med regimen by Dr. Sharl MaLama.  This RN was present during conversation.

## 2014-04-17 NOTE — Consult Note (Signed)
WOC wound consult note Reason for Consult:Healing Stage III PrU to left heel. MASD to bilateral inner thighs and sacrum. Patient known to our department from previous admissions and I have seen her within the last month. Wound type:Pressure and Moisture Associated Skin Damage. Pressure Ulcer POA: Yes: left heel Measurement:Sacral area of involvement measures 8cm x 10cm and presents with <5 pinpoint areas of tissue loss secondary to moisture.  The left heel Stage III Pru measures 2.5cm x 1cm x 0.2cm and is contracting and resurfacing. The inner thighs are with erythema and scattered open areas secondary to moisture.  No warmth, no induration. Wound bed: scattered areas of partial thickness tissue loss at sacrum-MASD. Resurfacing tissue (reepithelialization) at left heel, albeit tissue is friable.  Inner thighs with erythema and scattered areas of denudement. Drainage (amount, consistency, odor) none Periwound:intact, with mild maceration at sacrum Dressing procedure/placement/frequency:Prevalon boots are provided as she did not arrive with them on this admission.  Guidelines for positioning are provided for nursing staff. WOC nursing team will not follow, but will remain available to this patient, the nursing and medical team.  Please re-consult if needed. Thanks, Ladona MowLaurie Reisha Wos, MSN, RN, GNP, [place], CWON-AP [phone])

## 2014-04-17 NOTE — Progress Notes (Addendum)
INITIAL NUTRITION ASSESSMENT  Pt meets criteria for severe MALNUTRITION in the context of chronic illness as evidenced by weight loss of 8% in the past 2 1/2 weeks and intake <75% for > 1 month.  DOCUMENTATION CODES Per approved criteria  -Severe malnutrition in the context of chronic illness   INTERVENTION:  Corrected diet to Dysphagia 3 thin (texture soft)  Ensure pudding tid as needed  Boost tid (patient preference)  RD to follow  NUTRITION DIAGNOSIS: Inadequate oral intake related to difficulty chewing, decreased alertness, decreased appetite as evidenced by observation and patient report.   Goal: Intake of meals and supplements to meet >90% estimated needs.  Monitor:  Intake, labs, diet tolerance, weight trend  Reason for Assessment: low braden  68 y.o. female  Admitting Dx: <principal problem not specified>  ASSESSMENT: Patient admitted with hypotension, leukocytosis with elevated lactic acid and started on antibiotics.  Blood and urine culture is negative so far.  Hx includes paraplegia from spinal cord injury and several decubitus ulcers (healing stage III left heal, stage II right buttock, ulceration right inner thigh.  10/12: Saw at breakfast.  Sleeping and no or minimal oral intake secondary to decreased level of alertness.  Discussed with tech.  Patient only able to be woken by tech for bath.  It took about 20 minutes to waken her. Saw at lunch.  Son was feeding her.   Unable to chew well secondary to dentures do not fit well from "jaw bone deterioration" per son. Renal/soft tray.- no indication for a renal diet or order noted. Decreased appetite and intake at home.  Likes Boost. Improving intake currently. Weight loss of 24 lbs in the past 2 1/2 weeks (8%).    Height: Ht Readings from Last 1 Encounters:  04/16/14 5\' 2"  (1.575 m)    Weight: Wt Readings from Last 1 Encounters:  04/17/14 157 lb 6.5 oz (71.4 kg)    Ideal Body Weight: 110 lbs  %  Ideal Body Weight: 143  Wt Readings from Last 10 Encounters:  04/17/14 157 lb 6.5 oz (71.4 kg)  03/29/14 171 lb 4.8 oz (77.7 kg)  03/14/14 165 lb 2 oz (74.9 kg)  06/10/13 173 lb (78.472 kg)    Usual Body Weight: 171 lbs 2.5 weeks ago  % Usual Body Weight: 92  BMI:  Body mass index is 28.78 kg/(m^2).  Estimated Nutritional Needs: Kcal: 1550-1650 Protein: 80-90 gm Fluid: 1.7L  Skin: decubitus, multiple sites (see assessment)  Diet Order: soft  EDUCATION NEEDS: -No education needs identified at this time   Intake/Output Summary (Last 24 hours) at 04/17/14 1421 Last data filed at 04/17/14 1305  Gross per 24 hour  Intake    800 ml  Output   1975 ml  Net  -1175 ml    Labs:   Recent Labs Lab 04/16/14 0046 04/16/14 0540 04/17/14 0345  NA 135*  --  138  K 3.0*  --  3.2*  CL 95*  --  99  CO2 24  --  23  BUN 16  --  6  CREATININE 0.65  --  0.42*  CALCIUM 9.0  --  9.0  MG  --  1.6  --   GLUCOSE 113*  --  117*    CBG (last 3)   Recent Labs  04/16/14 2151 04/17/14 0744 04/17/14 1159  GLUCAP 101* 104* 128*    Scheduled Meds: . antiseptic oral rinse  7 mL Mouth Rinse BID  . aspirin EC  81 mg Oral  q morning - 10a  . baclofen  10 mg Oral TID  . enoxaparin (LOVENOX) injection  40 mg Subcutaneous Q24H  . ezetimibe  10 mg Oral q morning - 10a  . feeding supplement (ENSURE)  1 Container Oral BID BM  . fenofibrate  54 mg Oral Daily  . gabapentin  300 mg Oral TID  . insulin aspart  0-9 Units Subcutaneous TID WC  . magnesium oxide  400 mg Oral BID  . multivitamin with minerals  1 tablet Oral q morning - 10a  . OxyCODONE  20 mg Oral BID  . pantoprazole  40 mg Oral Daily  . piperacillin-tazobactam (ZOSYN)  IV  3.375 g Intravenous Q8H  . potassium chloride SA  20 mEq Oral TID  . tamsulosin  0.4 mg Oral q morning - 10a  . vancomycin  1,000 mg Intravenous Q12H  . Vortioxetine HBr  10 mg Oral Daily    Continuous Infusions: . sodium chloride 50 mL/hr at 04/17/14  0845    Past Medical History  Diagnosis Date  . GERD (gastroesophageal reflux disease)   . Quadriplegia   . Hypertension   . Pressure ulcer of foot   . Chronic pain disorder   . Hyperlipidemia     Past Surgical History  Procedure Laterality Date  . Back surgery  08/2013  . Abdominal hysterectomy      Oran ReinLaura Kenshin Splawn, RD, LDN Clinical Inpatient Dietitian Pager:  [phone] Weekend and after hours pager:  [phone]

## 2014-04-17 NOTE — Progress Notes (Addendum)
[facility] PROGRESS NOTE  [name] [name] ZOX:[id number] DOB: 30-Dec-1945 DOA: 04/15/2014 PCP: [name], MD  Assessment/Plan:  Sepsis  Improving, patient was admitted with hypotension, leukocytosis and with elevated lactic acid. Started on vancomycin and Zosyn, urine culture and blood cultures obtained. At this time patient's blood pressure has improved she is more alert oriented x3. Pro calcitonin level is < 0.10 Blood cultures are negative so far, urine culture is negative. Lactic acid was elevated at December admission, will repeat lactic acid level.  Acute encephalopathy  Resolved Patient is on multiple psychotropic/ sleep aid medications which have been contributed to patient's encephalopathy, patient's son was giving her lorazepam 3 times a day, along with trazodone, trifluoperazine, gabapentin, OxyContin 40 twice a day, Will change OxyContin to 20 mg every 12 hours, discontinue trifluoperazine,  trazodone, ambien. Change Ativan to 0.5 mg by mouth every 12 hours when necessary.  DM with complications of neuropathy  Started on sliding scale insulin with NovoLog. I will hold the metformin at this time.  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  Hypokalemia Replace potassium check BMP in a.m.   Paraplegia, from spinal cord injury  Patient is bedbound due to paraplegia, will get a mattress.    Decubitus ulcers, multiple sites  - Healing Stage III left heel: 0.2cm x 0.3cm x 0 area with healing skin and small black scabbed area  - Stage II right buttock: 0.5cm x 0.5cm x 0.2cm  - Ulceration right inner thigh: 1.0cm x 5.0cm x 0.2cm    DVT prophylaxis Lovenox   Code Status: *Full code Family Communication: *Spoke to  patient's son at bedside Disposition Plan: *TBD   Consultants:  None  Procedures:  None  Antibiotics:  Zosyn 10/11  Vancomycin 10/11   HPI/Subjective: 68 year old female with paraplegia following spinal cord injury, hypertension, recently hospitalized from 9/7 - 9/11 and again 9/20 - 9/25 for sepsis secondary to Klebsiella UTI, now presented again to [facility] ED with altered mental status and progressive failure to thrive, lethargy. Please note that patient is unable to provide any history at, the admission do to altered mental state, no family at bedside available at the time of admission. Most of the information obtained from available records and nurse practitioner who saw patient while son was in the room. Son reported fevers as high as 101 Fahrenheit, poor oral intake, minimal responsiveness for the past 24-48 hours. No reported abdominal concerns, no reported chest pain or shortness of breath  Patient seen and examined this morning,  Alert, answering all questions appropriately.  Objective: Filed Vitals:   04/17/14 1300  BP: 122/61  Pulse: 65  Temp:   Resp: 15    Intake/Output Summary (Last 24 hours) at 04/17/14 1344 Last data filed at 04/17/14 1305  Gross per 24 hour  Intake    800 ml  Output   1975 ml  Net  -1175 ml   Filed Weights   04/16/14 0510 04/16/14 0645 04/17/14 0239  Weight: 77.7 kg (171 lb 4.8 oz) 69.6 kg (153 lb 7 oz) 71.4 kg (157 lb 6.5 oz)    Exam:  Physical Exam: Head: Normocephalic, atraumatic.  Eyes: No signs of jaundice, EOMI Lungs: Normal respiratory effort. B/L Clear to auscultation, no crackles or wheezes.  Heart: Regular RR. S1 and S2 normal  Abdomen: BS normoactive. Soft, Nondistended, non-tender.  Extremities: No pretibial edema, no erythema  Data Reviewed: Basic Metabolic Panel:  Recent Labs Lab 04/16/14 0046 04/16/14 0540 04/17/14 0345  NA 135*  --  138  K 3.0*  --  3.2*  CL 95*  --  99  CO2 24  --  23  GLUCOSE 113*  --   117*  BUN 16  --  6  CREATININE 0.65  --  0.42*  CALCIUM 9.0  --  9.0  MG  --  1.6  --    Liver Function Tests:  Recent Labs Lab 04/16/14 0046  AST 22  ALT 18  ALKPHOS 81  BILITOT 0.2*  PROT 6.7  ALBUMIN 3.4*   No results found for this basename: LIPASE, AMYLASE,  in the last 168 hours No results found for this basename: AMMONIA,  in the last 168 hours CBC:  Recent Labs Lab 04/16/14 0046 04/17/14 0345  WBC 11.4* 9.0  HGB 13.2 12.4  HCT 39.0 36.5  MCV 82.1 81.8  PLT 248 262   Cardiac Enzymes: No results found for this basename: CKTOTAL, CKMB, CKMBINDEX, TROPONINI,  in the last 168 hours BNP (last 3 results) No results found for this basename: PROBNP,  in the last 8760 hours CBG:  Recent Labs Lab 04/16/14 1226 04/16/14 1718 04/16/14 2151 04/17/14 0744 04/17/14 1159  GLUCAP 118* 112* 101* 104* 128*    Recent Results (from the past 240 hour(s))  URINE CULTURE     Status: None   Collection Time    04/16/14 12:10 AM      Result Value Ref Range Status   Specimen Description URINE, CATHETERIZED   Final   Special Requests NONE   Final   Culture  Setup Time     Final   Value: 04/16/2014 14:05     Performed at [facility] Count     Final   Value: NO GROWTH     Performed at [facility]   Culture     Final   Value: NO GROWTH     Performed at [facility]   Report Status 04/17/2014 FINAL   Final  CULTURE, BLOOD (ROUTINE X 2)     Status: None   Collection Time    04/16/14 12:50 AM      Result Value Ref Range Status   Specimen Description BLOOD RIGHT HAND   Final   Special Requests BOTTLES DRAWN AEROBIC AND ANAEROBIC 3CC   Final   Culture  Setup Time     Final   Value: 04/16/2014 13:31     Performed at [facility]   Culture     Final   Value:        BLOOD CULTURE RECEIVED NO GROWTH TO DATE CULTURE WILL BE HELD FOR 5 DAYS BEFORE ISSUING A FINAL NEGATIVE REPORT     Performed at [facility]   Report Status  PENDING   Incomplete  CULTURE, BLOOD (ROUTINE X 2)     Status: None   Collection Time    04/16/14 12:55 AM      Result Value Ref Range Status   Specimen Description BLOOD LEFT FOREARM   Final   Special Requests BOTTLES DRAWN AEROBIC AND ANAEROBIC 3CC   Final   Culture  Setup Time     Final   Value: 04/16/2014 13:30     Performed at [facility]   Culture     Final  Value:        BLOOD CULTURE RECEIVED NO GROWTH TO DATE CULTURE WILL BE HELD FOR 5 DAYS BEFORE ISSUING A FINAL NEGATIVE REPORT     [facility]   Report Status PENDING   Incomplete  MRSA PCR SCREENING     Status: None   Collection Time    04/16/14  6:47 AM      Result Value Ref Range Status   MRSA by PCR NEGATIVE  NEGATIVE Final   Comment:            The GeneXpert MRSA Assay (FDA     approved for NASAL specimens     only), is one component of a     comprehensive MRSA colonization     surveillance program. It is not     intended to diagnose MRSA     infection nor to guide or     monitor treatment for     MRSA infections.     Studies: Ct Head Wo Contrast  04/16/2014   CLINICAL DATA:  Acute onset of altered mental status. Initial encounter.  EXAM: CT HEAD WITHOUT CONTRAST  TECHNIQUE: Contiguous axial images were obtained from the base of the skull through the vertex without intravenous contrast.  COMPARISON:  CT of the head performed 03/14/2014  FINDINGS: There is no evidence of acute infarction, mass lesion, or intra- or extra-axial hemorrhage on CT.  Prominence of the ventricles and sulci reflects mild cortical volume loss. Mild cerebellar atrophy is noted. Mild periventricular white matter change likely reflects small vessel ischemic microangiopathy.  The brainstem and fourth ventricle are within normal limits. The basal ganglia are unremarkable in appearance. The cerebral hemispheres demonstrate grossly normal gray-white differentiation. No mass effect or midline shift is seen.  There is no  evidence of fracture; visualized osseous structures are unremarkable in appearance. The visualized portions of the orbits are within normal limits. The paranasal sinuses and mastoid air cells are well-aerated. No significant soft tissue abnormalities are seen.  IMPRESSION: 1. No acute intracranial pathology seen on CT. 2. Mild cortical volume loss and scattered small vessel ischemic microangiopathy.   Electronically Signed   By: [name]  Chang M.D.   On: 04/16/2014 04:07   Dg Chest Portable 1 View  04/16/2014   CLINICAL DATA:  Acute onset altered mental status. Initial encounter.  EXAM: PORTABLE CHEST - 1 VIEW  COMPARISON:  Chest radiograph from 03/26/2014  FINDINGS: The lungs are hypoexpanded. Vascular crowding is noted. Minimal bilateral atelectasis is seen. There is no evidence of pleural effusion or pneumothorax.  The cardiomediastinal silhouette is borderline enlarged. No acute osseous abnormalities are seen.  IMPRESSION: Lungs hypoexpanded, with minimal bilateral atelectasis. Borderline cardiomegaly noted.   Electronically Signed   By: [name]  Chang M.D.   On: 04/16/2014 01:48    Scheduled Meds: . antiseptic oral rinse  7 mL Mouth Rinse BID  . aspirin EC  81 mg Oral q morning - 10a  . baclofen  10 mg Oral TID  . enoxaparin (LOVENOX) injection  40 mg Subcutaneous Q24H  . ezetimibe  10 mg Oral q morning - 10a  . feeding supplement (ENSURE)  1 Container Oral BID BM  . fenofibrate  54 mg Oral Daily  . gabapentin  300 mg Oral TID  . insulin aspart  0-9 Units Subcutaneous TID WC  . magnesium oxide  400 mg Oral BID  . multivitamin with minerals  1 tablet Oral q morning - 10a  . OxyCODONE  20 mg Oral BID  . pantoprazole  40 mg Oral Daily  . piperacillin-tazobactam (ZOSYN)  IV  3.375 g Intravenous Q8H  . potassium chloride SA  20 mEq Oral TID  . tamsulosin  0.4 mg Oral q morning - 10a  . vancomycin  1,000 mg Intravenous Q12H  . Vortioxetine HBr  10 mg Oral Daily   Continuous Infusions: .  sodium chloride 50 mL/hr at 04/17/14 0845    Active Problems:   Altered mental state   Diabetes    Time spent: 25 min    [facility],Stockton Nunley S  [facility] Pager [phone]*. If 7PM-7AM, please contact night-coverage at [email or web address], password [email or web address] 04/17/2014, 1:44 PM  LOS: 2 days

## 2014-04-18 LAB — CBC
HCT: 33.6 % — ABNORMAL LOW (ref 36.0–46.0)
Hemoglobin: 11.3 g/dL — ABNORMAL LOW (ref 12.0–15.0)
MCH: 27.8 pg (ref 26.0–34.0)
MCHC: 33.6 g/dL (ref 30.0–36.0)
MCV: 82.8 fL (ref 78.0–100.0)
Platelets: 253 10*3/uL (ref 150–400)
RBC: 4.06 MIL/uL (ref 3.87–5.11)
RDW: 13.3 % (ref 11.5–15.5)
WBC: 9.8 10*3/uL (ref 4.0–10.5)

## 2014-04-18 LAB — URINE CULTURE
COLONY COUNT: NO GROWTH
Culture: NO GROWTH

## 2014-04-18 LAB — BASIC METABOLIC PANEL
ANION GAP: 13 (ref 5–15)
BUN: 6 mg/dL (ref 6–23)
CO2: 24 mEq/L (ref 19–32)
Calcium: 8.8 mg/dL (ref 8.4–10.5)
Chloride: 103 mEq/L (ref 96–112)
Creatinine, Ser: 0.59 mg/dL (ref 0.50–1.10)
Glucose, Bld: 158 mg/dL — ABNORMAL HIGH (ref 70–99)
POTASSIUM: 3.4 meq/L — AB (ref 3.7–5.3)
SODIUM: 140 meq/L (ref 137–147)

## 2014-04-18 LAB — PROCALCITONIN

## 2014-04-18 MED ORDER — POTASSIUM CHLORIDE CRYS ER 20 MEQ PO TBCR: 20.0000 meq | EXTENDED_RELEASE_TABLET | Freq: Two times a day (BID) | ORAL | Status: DC

## 2014-04-18 MED ORDER — OXYCODONE HCL ER 20 MG PO T12A: 20.0000 mg | EXTENDED_RELEASE_TABLET | Freq: Two times a day (BID) | ORAL | Status: DC

## 2014-04-18 MED ORDER — LORAZEPAM 1 MG PO TABS: 0.5000 mg | ORAL_TABLET | Freq: Two times a day (BID) | ORAL | Status: DC | PRN

## 2014-04-18 MED ORDER — SPIRONOLACTONE 25 MG PO TABS: 25.0000 mg | ORAL_TABLET | Freq: Every day | ORAL | Status: DC

## 2014-04-18 NOTE — Discharge Summary (Signed)
Physician Discharge Summary  [name] MeekerLinda Buhrman WUJ:[id number] DOB: 01-15-1946 DOA: 04/15/2014  PCP: [name], Samuel, MD  Admit date: 04/15/2014 Discharge date: 04/18/2014  Time spent: 50* minutes  Recommendations for Outpatient Follow-up:  1. *Follow up PCP in 2 weeks  Discharge Diagnoses:  Active Problems:   Altered mental state   Diabetes   Discharge Condition: Stable  Diet recommendation: Low salt diet  Filed Weights   04/16/14 0645 04/17/14 0239 04/18/14 0400  Weight: 69.6 kg (153 lb 7 oz) 71.4 kg (157 lb 6.5 oz) 72.576 kg (160 lb)    History of present illness:  68 year old female with paraplegia following spinal cord injury, hypertension, recently hospitalized from 9/7 - 9/11 and again 9/20 - 9/25 for sepsis secondary to Klebsiella UTI, [facility] with altered mental status and progressive failure to thrive, lethargy. Please note that patient is unable to provide any history at, the admission do to altered mental state, no family at bedside available at the time of admission. Most of the information obtained from available records and nurse practitioner who saw patient while son was in the room. Son reported fevers as high as 101 Fahrenheit, poor oral intake, minimal responsiveness for the past 24-48 hours. No reported abdominal concerns, no reported chest pain or shortness of breath.  In ED, pt noted to be somnolent but easy to arouse, opens eyes with verbal stimuli. Vital signs: Temperature 100.3 F, BP 96/43, pulse 58. Blood work notable for WBC 11.4, potassium 3.0, lactic acid greater than 3.5, otherwise unremarkable. Patient given one dose of vancomycin and Zosyn and started on IV fluids for treatment of presumptive sepsis. [facility] asked to admit for further evaluation     Hospital Course:   ? Sepsis  Doubt that she was septic as the mental status improved after stooping the hypnotics and sedatives.And Blood pressure improved after stopping  antihypertensives.  . Started on vancomycin and Zosyn, urine culture and blood cultures obtained. At this time patient's blood pressure has improved she is more alert oriented x3. Pro calcitonin level is < 0.10 , she is afebrile, normal wbc, vitals have been stable. Blood cultures are negative so far, urine culture is negative. Lactic acid was elevated at  admission, will repeat lactic acid level was 2.7 Will discontinue the antibiotics.   Acute encephalopathy  Resolved Patient is on multiple psychotropic/ sleep aid medications which have been contributed to patient's encephalopathy, patient's son was giving her lorazepam 3 times a day, along with trazodone, trifluoperazine, gabapentin, OxyContin 40 twice a day,  Will change OxyContin to 20 mg every 12 hours, discontinue trifluoperazine, trazodone, ambien. Change Ativan to 0.5 mg by mouth every 12 hours when necessary.  Mental status has much improved after stopping the hypnotics   DM with complications of neuropathy  Continue with metformin at home.  Hypotension  Resolved Patient's home regimen including torsemide, spironolactone, Metolazone were  held  Will discontinue the metolazone, cut down the dose of spironolactone to once daily.   Hypokalemia  Continue the home dose of Kdur  Paraplegia, from spinal cord injury  Stable  Decubitus ulcers, multiple sites  - Healing Stage III left heel: 0.2cm x 0.3cm x 0 area with healing skin and small black scabbed area  - Stage II right buttock: 0.5cm x 0.5cm x 0.2cm  - Ulceration right inner thigh: 1.0cm x 5.0cm x 0.2cm    Procedures:  None  Consultations: None  Discharge Exam: Filed Vitals:   04/18/14 0800  BP:  Pulse:   Temp: 98.3 F (36.8 C)  Resp:     General: Appear in no acute distress Cardiovascular: S1s2 RRR Respiratory: Clear bilaterally  Discharge Instructions You were cared for by a hospitalist during your hospital stay. If you have any questions about your  discharge medications or the care you received while you were in the hospital after you are discharged, you can call the unit and asked to speak with the hospitalist on call if the hospitalist that took care of you is not available. Once you are discharged, your primary care physician will handle any further medical issues. Please note that NO REFILLS for any discharge medications will be authorized once you are discharged, as it is imperative that you return to your primary care physician (or establish a relationship with a primary care physician if you do not have one) for your aftercare needs so that they can reassess your need for medications and monitor your lab values.  Discharge Instructions   Diet - low sodium heart healthy    Complete by:  As directed      Increase activity slowly    Complete by:  As directed           Current Discharge Medication List    CONTINUE these medications which have CHANGED   Details  LORazepam (ATIVAN) 1 MG tablet Take 0.5 tablets (0.5 mg total) by mouth every 12 (twelve) hours as needed for anxiety. Qty: 20 tablet, Refills: 0    OxyCODONE (OXYCONTIN) 20 mg T12A 12 hr tablet Take 1 tablet (20 mg total) by mouth 2 (two) times daily. Qty: 20 tablet, Refills: 0    potassium chloride SA (K-DUR,KLOR-CON) 20 MEQ tablet Take 1 tablet (20 mEq total) by mouth 2 (two) times daily.    spironolactone (ALDACTONE) 25 MG tablet Take 1 tablet (25 mg total) by mouth daily. Qty: 30 tablet, Refills: 0      CONTINUE these medications which have NOT CHANGED   Details  aspirin EC 81 MG tablet Take 81 mg by mouth every morning.     baclofen (LIORESAL) 10 MG tablet Take 10 mg by mouth 3 (three) times daily.    Choline Fenofibrate (TRILIPIX) 135 MG capsule Take 135 mg by mouth every morning.     ezetimibe (ZETIA) 10 MG tablet Take 10 mg by mouth every morning.     feeding supplement, ENSURE, (ENSURE) PUDG Take 1 Container by mouth 2 (two) times daily between  meals. Qty: 60 Container, Refills: 0    gabapentin (NEURONTIN) 300 MG capsule Take 300 mg by mouth 3 (three) times daily.    magnesium oxide (MAG-OX) 400 MG tablet Take 400 mg by mouth 2 (two) times daily.    metFORMIN (GLUCOPHAGE) 500 MG tablet Take 500 mg by mouth daily with breakfast.     Multiple Vitamin (MULTIVITAMIN WITH MINERALS) TABS tablet Take 1 tablet by mouth every morning.     omeprazole (PRILOSEC) 20 MG capsule Take 20 mg by mouth every morning.     polyethylene glycol (MIRALAX / GLYCOLAX) packet Take 17 g by mouth daily as needed for mild constipation.    tamsulosin (FLOMAX) 0.4 MG CAPS capsule Take 0.4 mg by mouth every morning.     torsemide (DEMADEX) 20 MG tablet Take 20 mg by mouth every morning.     Vortioxetine HBr (BRINTELLIX) 10 MG TABS Take 10 mg by mouth daily.     zolpidem (AMBIEN) 5 MG tablet Take 5 mg by mouth at bedtime as  needed for sleep.    promethazine (PHENERGAN) 25 MG tablet Take 25 mg by mouth every 6 (six) hours as needed for nausea or vomiting.      STOP taking these medications     traZODone (DESYREL) 50 MG tablet      trifluoperazine (STELAZINE) 5 MG tablet      metolazone (ZAROXOLYN) 2.5 MG tablet        Allergies  Allergen Reactions  . Decadrol [Dexamethasone] Swelling  . Decadron [Dexamethasone] Swelling  . Sodium Phosphate Swelling      The results of significant diagnostics from this hospitalization (including imaging, microbiology, ancillary and laboratory) are listed below for reference.    Significant Diagnostic Studies: Ct Head Wo Contrast  04/16/2014   CLINICAL DATA:  Acute onset of altered mental status. Initial encounter.  EXAM: CT HEAD WITHOUT CONTRAST  TECHNIQUE: Contiguous axial images were obtained from the base of the skull through the vertex without intravenous contrast.  COMPARISON:  CT of the head performed 03/14/2014  FINDINGS: There is no evidence of acute infarction, mass lesion, or intra- or extra-axial  hemorrhage on CT.  Prominence of the ventricles and sulci reflects mild cortical volume loss. Mild cerebellar atrophy is noted. Mild periventricular white matter change likely reflects small vessel ischemic microangiopathy.  The brainstem and fourth ventricle are within normal limits. The basal ganglia are unremarkable in appearance. The cerebral hemispheres demonstrate grossly normal gray-white differentiation. No mass effect or midline shift is seen.  There is no evidence of fracture; visualized osseous structures are unremarkable in appearance. The visualized portions of the orbits are within normal limits. The paranasal sinuses and mastoid air cells are well-aerated. No significant soft tissue abnormalities are seen.  IMPRESSION: 1. No acute intracranial pathology seen on CT. 2. Mild cortical volume loss and scattered small vessel ischemic microangiopathy.   Electronically Signed   By: [name]  Chang M.D.   On: 04/16/2014 04:07   Dg Chest Portable 1 View  04/16/2014   CLINICAL DATA:  Acute onset altered mental status. Initial encounter.  EXAM: PORTABLE CHEST - 1 VIEW  COMPARISON:  Chest radiograph from 03/26/2014  FINDINGS: The lungs are hypoexpanded. Vascular crowding is noted. Minimal bilateral atelectasis is seen. There is no evidence of pleural effusion or pneumothorax.  The cardiomediastinal silhouette is borderline enlarged. No acute osseous abnormalities are seen.  IMPRESSION: Lungs hypoexpanded, with minimal bilateral atelectasis. Borderline cardiomegaly noted.   Electronically Signed   By: [name]  Chang M.D.   On: 04/16/2014 01:48   Dg Abd Acute W/chest  03/26/2014   CLINICAL DATA:  Abdominal swelling, lethargy and fatigue.  EXAM: ACUTE ABDOMEN SERIES (ABDOMEN 2 VIEW & CHEST 1 VIEW)  COMPARISON:  Portable chest dated 03/13/2014 and previous abdomen ultrasound and CT examinations.  FINDINGS: The cardiac silhouette remains borderline enlarged. Stable linear scarring at the medial left lung base.  Otherwise, clear lungs. Normal bowel gas pattern without free peritoneal air. Mildly prominent stool. Right lower quadrant abdominal surgical clips. Lumbar spine degenerative changes and mild scoliosis.  IMPRESSION: No acute abnormality.  Mildly prominent stool.   Electronically Signed   By: Gordan PaymentSteve  Reid M.D.   On: 03/26/2014 13:02    Microbiology: Recent Results (from the past 240 hour(s))  URINE CULTURE     Status: None   Collection Time    04/16/14 12:10 AM      Result Value Ref Range Status   Specimen Description URINE, CATHETERIZED   Final   Special Requests NONE   Final  Culture  Setup Time     Final   Value: 04/16/2014 14:05     Performed at [facility] Count     Final   Value: NO GROWTH     Performed at [facility]   Culture     Final   Value: NO GROWTH     Performed at [facility]   Report Status 04/17/2014 FINAL   Final  CULTURE, BLOOD (ROUTINE X 2)     Status: None   Collection Time    04/16/14 12:50 AM      Result Value Ref Range Status   Specimen Description BLOOD RIGHT HAND   Final   Special Requests BOTTLES DRAWN AEROBIC AND ANAEROBIC 3CC   Final   Culture  Setup Time     Final   Value: 04/16/2014 13:31     Performed at [facility]   Culture     Final   Value:        BLOOD CULTURE RECEIVED NO GROWTH TO DATE CULTURE WILL BE HELD FOR 5 DAYS BEFORE ISSUING A FINAL NEGATIVE REPORT     Performed at [facility]   Report Status PENDING   Incomplete  CULTURE, BLOOD (ROUTINE X 2)     Status: None   Collection Time    04/16/14 12:55 AM      Result Value Ref Range Status   Specimen Description BLOOD LEFT FOREARM   Final   Special Requests BOTTLES DRAWN AEROBIC AND ANAEROBIC 3CC   Final   Culture  Setup Time     Final   Value: 04/16/2014 13:30     Performed at [facility]   Culture     Final   Value:        BLOOD CULTURE RECEIVED NO GROWTH TO DATE CULTURE WILL BE HELD FOR 5 DAYS BEFORE ISSUING A FINAL  NEGATIVE REPORT     Performed at [facility]   Report Status PENDING   Incomplete  MRSA PCR SCREENING     Status: None   Collection Time    04/16/14  6:47 AM      Result Value Ref Range Status   MRSA by PCR NEGATIVE  NEGATIVE Final   Comment:            The GeneXpert MRSA Assay (FDA     approved for NASAL specimens     only), is one component of a     comprehensive MRSA colonization     surveillance program. It is not     intended to diagnose MRSA     infection nor to guide or     monitor treatment for     MRSA infections.     Labs: Basic Metabolic Panel:  Recent Labs Lab 04/16/14 0046 04/16/14 0540 04/17/14 0345 04/18/14 0330  NA 135*  --  138 140  K 3.0*  --  3.2* 3.4*  CL 95*  --  99 103  CO2 24  --  23 24  GLUCOSE 113*  --  117* 158*  BUN 16  --  6 6  CREATININE 0.65  --  0.42* 0.59  CALCIUM 9.0  --  9.0 8.8  MG  --  1.6  --   --    Liver Function Tests:  Recent Labs Lab 04/16/14 0046  AST 22  ALT 18  ALKPHOS 81  BILITOT 0.2*  PROT 6.7  ALBUMIN 3.4*   No results  found for this basename: LIPASE, AMYLASE,  in the last 168 hours No results found for this basename: AMMONIA,  in the last 168 hours CBC:  Recent Labs Lab 04/16/14 0046 04/17/14 0345 04/18/14 0330  WBC 11.4* 9.0 9.8  HGB 13.2 12.4 11.3*  HCT 39.0 36.5 33.6*  MCV 82.1 81.8 82.8  PLT 248 262 253   Cardiac Enzymes: No results found for this basename: CKTOTAL, CKMB, CKMBINDEX, TROPONINI,  in the last 168 hours BNP: BNP (last 3 results) No results found for this basename: PROBNP,  in the last 8760 hours CBG:  Recent Labs Lab 04/16/14 1718 04/16/14 2151 04/17/14 0744 04/17/14 1159 04/17/14 1603  GLUCAP 112* 101* 104* 128* 147*       Signed:  Mccade Sullenberger S  [facility] 04/18/2014, 10:46 AM

## 2014-04-19 LAB — GLUCOSE, CAPILLARY: GLUCOSE-CAPILLARY: 109 mg/dL — AB (ref 70–99)

## 2014-04-19 NOTE — ED Provider Notes (Signed)
Medical screening examination/treatment/procedure(s) were conducted as a shared visit with non-physician practitioner(s) and myself.  I personally evaluated the patient during the encounter.   EKG Interpretation   Date/Time:  Sunday April 16 2014 00:11:01 EDT Ventricular Rate:  67 PR Interval:  176 QRS Duration: 79 QT Interval:  462 QTC Calculation: 488 R Axis:   18 Text Interpretation:  Sinus rhythm Probable left atrial enlargement  Abnormal R-wave progression, early transition Confirmed by HORTON  MD,  COURTNEY ([id number]) on 04/16/2014 1:42:56 AM      Patient presents with altered mental status. Currently being treated for UTI. She is chronically ill appearing. Very somnolent on exam. She is noncontributory to history taking. Noted to be febrile on arrival. Sepsis workup initiated.  Exam is most notable for multiple bulla over the right with associated erythema. Patient is otherwise in no acute distress. Patient given empirical antibiotics and fluids.  Lactate of 3.9. Chest x-ray and urine are largely reassuring. At this time source is unknown but could be secondary to skin infection of the right upper extremity. Will admit for further evaluation.    Shon Batonourtney F Horton, MD 04/19/14 [phone]

## 2014-04-22 LAB — CULTURE, BLOOD (ROUTINE X 2)
CULTURE: NO GROWTH
Culture: NO GROWTH

## 2014-06-09 ENCOUNTER — Ambulatory Visit: Payer: Self-pay | Admitting: Podiatry

## 2014-08-25 ENCOUNTER — Encounter (HOSPITAL_COMMUNITY): Payer: Self-pay | Admitting: *Deleted

## 2014-08-25 ENCOUNTER — Inpatient Hospital Stay (HOSPITAL_COMMUNITY)
Admission: EM | Admit: 2014-08-25 | Discharge: 2014-08-29 | DRG: 871 | Disposition: A | Discharge status: Home / Self Care | Payer: Medicare Other | Attending: Internal Medicine | Admitting: Internal Medicine

## 2014-08-25 ENCOUNTER — Emergency Department (HOSPITAL_COMMUNITY): Payer: Medicare Other

## 2014-08-25 DIAGNOSIS — N390 Urinary tract infection, site not specified: Secondary | ICD-10-CM | POA: Diagnosis present

## 2014-08-25 DIAGNOSIS — Z79899 Other long term (current) drug therapy: Secondary | ICD-10-CM

## 2014-08-25 DIAGNOSIS — R509 Fever, unspecified: Secondary | ICD-10-CM | POA: Diagnosis present

## 2014-08-25 DIAGNOSIS — G894 Chronic pain syndrome: Secondary | ICD-10-CM | POA: Diagnosis present

## 2014-08-25 DIAGNOSIS — Z87891 Personal history of nicotine dependence: Secondary | ICD-10-CM

## 2014-08-25 DIAGNOSIS — G9341 Metabolic encephalopathy: Secondary | ICD-10-CM | POA: Diagnosis present

## 2014-08-25 DIAGNOSIS — K219 Gastro-esophageal reflux disease without esophagitis: Secondary | ICD-10-CM | POA: Diagnosis present

## 2014-08-25 DIAGNOSIS — E872 Acidosis: Secondary | ICD-10-CM | POA: Diagnosis present

## 2014-08-25 DIAGNOSIS — Z87440 Personal history of urinary (tract) infections: Secondary | ICD-10-CM | POA: Diagnosis not present

## 2014-08-25 DIAGNOSIS — G8929 Other chronic pain: Secondary | ICD-10-CM | POA: Diagnosis present

## 2014-08-25 DIAGNOSIS — A419 Sepsis, unspecified organism: Secondary | ICD-10-CM | POA: Diagnosis present

## 2014-08-25 DIAGNOSIS — I10 Essential (primary) hypertension: Secondary | ICD-10-CM | POA: Diagnosis present

## 2014-08-25 DIAGNOSIS — G8220 Paraplegia, unspecified: Secondary | ICD-10-CM | POA: Diagnosis present

## 2014-08-25 DIAGNOSIS — R4182 Altered mental status, unspecified: Secondary | ICD-10-CM

## 2014-08-25 DIAGNOSIS — E119 Type 2 diabetes mellitus without complications: Secondary | ICD-10-CM

## 2014-08-25 DIAGNOSIS — E118 Type 2 diabetes mellitus with unspecified complications: Secondary | ICD-10-CM

## 2014-08-25 LAB — CBC WITH DIFFERENTIAL/PLATELET
BASOS ABS: 0 10*3/uL (ref 0.0–0.1)
Basophils Relative: 0 % (ref 0–1)
EOS PCT: 3 % (ref 0–5)
Eosinophils Absolute: 0.3 10*3/uL (ref 0.0–0.7)
HCT: 44 % (ref 36.0–46.0)
HEMOGLOBIN: 14.4 g/dL (ref 12.0–15.0)
Lymphocytes Relative: 7 % — ABNORMAL LOW (ref 12–46)
Lymphs Abs: 0.8 10*3/uL (ref 0.7–4.0)
MCH: 28.2 pg (ref 26.0–34.0)
MCHC: 32.7 g/dL (ref 30.0–36.0)
MCV: 86.3 fL (ref 78.0–100.0)
Monocytes Absolute: 0.2 10*3/uL (ref 0.1–1.0)
Monocytes Relative: 2 % — ABNORMAL LOW (ref 3–12)
Neutro Abs: 9.1 10*3/uL — ABNORMAL HIGH (ref 1.7–7.7)
Neutrophils Relative %: 88 % — ABNORMAL HIGH (ref 43–77)
Platelets: 330 10*3/uL (ref 150–400)
RBC: 5.1 MIL/uL (ref 3.87–5.11)
RDW: 14 % (ref 11.5–15.5)
WBC: 10.4 10*3/uL (ref 4.0–10.5)

## 2014-08-25 LAB — COMPREHENSIVE METABOLIC PANEL
ALT: 25 U/L (ref 0–35)
ANION GAP: 14 (ref 5–15)
AST: 31 U/L (ref 0–37)
Albumin: 3.7 g/dL (ref 3.5–5.2)
Alkaline Phosphatase: 84 U/L (ref 39–117)
BILIRUBIN TOTAL: 0.4 mg/dL (ref 0.3–1.2)
BUN: 16 mg/dL (ref 6–23)
CHLORIDE: 99 mmol/L (ref 96–112)
CO2: 26 mmol/L (ref 19–32)
Calcium: 9 mg/dL (ref 8.4–10.5)
Creatinine, Ser: 0.7 mg/dL (ref 0.50–1.10)
GFR calc Af Amer: >90 mL/min (ref >90)
GFR calc non Af Amer: 86 mL/min — ABNORMAL LOW (ref >90)
GLUCOSE: 187 mg/dL — AB (ref 70–99)
POTASSIUM: 4 mmol/L (ref 3.5–5.1)
SODIUM: 139 mmol/L (ref 135–145)
TOTAL PROTEIN: 7.2 g/dL (ref 6.0–8.3)

## 2014-08-25 LAB — URINALYSIS, ROUTINE W REFLEX MICROSCOPIC
Bilirubin Urine: NEGATIVE
GLUCOSE, UA: NEGATIVE mg/dL
Hgb urine dipstick: NEGATIVE
Ketones, ur: NEGATIVE mg/dL
LEUKOCYTES UA: NEGATIVE
Nitrite: NEGATIVE
PH: 6.5 (ref 5.0–8.0)
PROTEIN: NEGATIVE mg/dL
Specific Gravity, Urine: 1.015 (ref 1.005–1.030)
Urobilinogen, UA: 0.2 mg/dL (ref 0.0–1.0)

## 2014-08-25 LAB — TROPONIN I

## 2014-08-25 LAB — I-STAT CG4 LACTIC ACID, ED
Lactic Acid, Venous: 4.29 mmol/L (ref 0.5–2.0)
Lactic Acid, Venous: 3.71 mmol/L (ref 0.5–2.0)

## 2014-08-25 MED ORDER — SODIUM CHLORIDE 0.9 % IV BOLUS (SEPSIS)
1000.0000 mL | Freq: Once | INTRAVENOUS | Status: AC
  Administered 2014-08-25: 1000 mL via INTRAVENOUS

## 2014-08-25 MED ORDER — SODIUM CHLORIDE 0.9 % IV BOLUS (SEPSIS)
2000.0000 mL | Freq: Once | INTRAVENOUS | Status: AC
Start: 2014-08-25 — End: 2014-08-25
  Administered 2014-08-25: 2000 mL via INTRAVENOUS

## 2014-08-25 MED ORDER — PIPERACILLIN-TAZOBACTAM 3.375 G IVPB 30 MIN
3.3750 g | Freq: Once | INTRAVENOUS | Status: AC
  Administered 2014-08-25: 3.375 g via INTRAVENOUS
  Filled 2014-08-25: qty 50

## 2014-08-25 MED ORDER — KETOROLAC TROMETHAMINE 30 MG/ML IJ SOLN
15.0000 mg | Freq: Once | INTRAMUSCULAR | Status: AC
  Administered 2014-08-25: 15 mg via INTRAVENOUS
  Filled 2014-08-25: qty 1

## 2014-08-25 MED ORDER — PIPERACILLIN-TAZOBACTAM 3.375 G IVPB
3.3750 g | Freq: Three times a day (TID) | INTRAVENOUS | Status: DC
  Administered 2014-08-26 – 2014-08-29 (×11): 3.375 g via INTRAVENOUS
  Filled 2014-08-25 (×13): qty 50

## 2014-08-25 MED ORDER — VANCOMYCIN HCL IN DEXTROSE 750-5 MG/150ML-% IV SOLN
750.0000 mg | Freq: Two times a day (BID) | INTRAVENOUS | Status: DC
  Filled 2014-08-25: qty 150

## 2014-08-25 MED ORDER — ACETAMINOPHEN 650 MG RE SUPP
650.0000 mg | Freq: Once | RECTAL | Status: AC
  Administered 2014-08-25: 650 mg via RECTAL
  Filled 2014-08-25: qty 1

## 2014-08-25 MED ORDER — VANCOMYCIN HCL IN DEXTROSE 1-5 GM/200ML-% IV SOLN
1000.0000 mg | Freq: Once | INTRAVENOUS | Status: AC
  Administered 2014-08-25: 1000 mg via INTRAVENOUS
  Filled 2014-08-25: qty 200

## 2014-08-25 NOTE — ED Notes (Signed)
Per Rodney Boozeasha in main lab, urine sent for U/A not enough, however, urine will be used for culture. Will need to recollect urine for U/A. RN notified

## 2014-08-25 NOTE — Progress Notes (Signed)
ANTIBIOTIC CONSULT NOTE - INITIAL  Pharmacy Consult for Vancomycin and Zosyn Indication: rule out sepsis  Allergies  Allergen Reactions  . Decadrol [Dexamethasone] Swelling  . Decadron [Dexamethasone] Swelling  . Sodium Phosphate Swelling    Patient Measurements: Height: 5\' 2"  (157.5 cm) Weight: 162 lb (73.483 kg) IBW/kg (Calculated) : 50.1  Vital Signs: Temp: 104.7 F (40.4 C) (02/19 1924) Temp Source: Oral (02/19 1924) BP: 173/82 mmHg (02/19 1924) Pulse Rate: 125 (02/19 1924) Intake/Output from previous day:   Intake/Output from this shift:    Labs:  Recent Labs  08/25/14 1934  WBC 10.4  HGB 14.4  PLT 330   CrCl cannot be calculated (Patient has no serum creatinine result on file.). No results for input(s): VANCOTROUGH, VANCOPEAK, VANCORANDOM, GENTTROUGH, GENTPEAK, GENTRANDOM, TOBRATROUGH, TOBRAPEAK, TOBRARND, AMIKACINPEAK, AMIKACINTROU, AMIKACIN in the last 72 hours.   Microbiology: No results found for this or any previous visit (from the past 720 hour(s)).  Medical History: Past Medical History  Diagnosis Date  . GERD (gastroesophageal reflux disease)   . Quadriplegia   . Hypertension   . Pressure ulcer of foot   . Chronic pain disorder   . Hyperlipidemia     Medications:  Scheduled:   Infusions:  . piperacillin-tazobactam 3.375 g (08/25/14 1949)  . vancomycin 1,000 mg (08/25/14 2012)   PRN:   Assessment: 69 yo quadriplegic female brought in by spouse for increased lethargy. She has been on antibiotic for UTI, planned to complete two-week course tomorrow 2/20. Patient has had multiple admissions for sepsis. Pharmacy is consulted to dose vancomycin and zosyn.  2/19 >> Vancomycin >> 2/19 >> Zosyn >>  Tmax: 104.7 WBC: 10.4k Renal: SCr 0.7, CrCl 74 ml/min Normalized, 59 ml/min CG Lactate: 4.29  2/19 blood: 2/19 urine:  Levels / dose change info: 03/28/14 Vanc Trough: 13.6 mcg/ml (SCr 0.5) on 750 mg q12h, increase to 1g q12h  Goal of  Therapy:  Vancomycin trough level 15-20 mcg/ml  Zosyn dose per renal function  Plan:   Vancomycin 1g x 1, then 750mg  IV q12h Check trough at steady state Zosyn 3.375gm IV q8h (4hr extended infusions) Follow up renal function & cultures, clinical course  Loralee PacasErin Luxe Cuadros, PharmD, BCPS Pager: [phone] 08/25/2014,8:14 PM

## 2014-08-25 NOTE — ED Provider Notes (Signed)
CSN: [id number]     Arrival date & time 08/25/14  1901 History   First MD Initiated Contact with Patient 08/25/14 1925     Chief Complaint  Patient presents with  . Altered Mental Status  . Urinary Tract Infection  . Fatigue     (Consider location/radiation/quality/duration/timing/severity/associated sxs/prior Treatment) Patient is a 69 y.o. female presenting with altered mental status. The history is provided by the patient. No language interpreter was used.  Altered Mental Status Presenting symptoms: confusion and partial responsiveness   Severity:  Severe Most recent episode:  Today Episode history:  Single Timing:  Constant Progression:  Worsening Chronicity:  New Context: recent illness (2 weeks of abx use for UTI)   Associated symptoms: fever   Associated symptoms: no abdominal pain, no agitation, no difficulty breathing, no headaches, no nausea, no palpitations, no rash, no vomiting and no weakness   Associated symptoms comment:  Cough    Past Medical History  Diagnosis Date  . GERD (gastroesophageal reflux disease)   . Quadriplegia   . Hypertension   . Pressure ulcer of foot   . Chronic pain disorder   . Hyperlipidemia    Past Surgical History  Procedure Laterality Date  . Back surgery  08/2013  . Abdominal hysterectomy     Family History  Problem Relation Age of Onset  . Adopted: Yes   History  Substance Use Topics  . Smoking status: Former Smoker -- 0.25 packs/day    Types: Cigarettes  . Smokeless tobacco: Never Used  . Alcohol Use: No   OB History    No data available     Review of Systems  Constitutional: Positive for fever. Negative for chills, diaphoresis, activity change, appetite change and fatigue.  HENT: Negative for congestion, facial swelling, rhinorrhea and sore throat.   Eyes: Negative for photophobia and discharge.  Respiratory: Negative for cough, chest tightness and shortness of breath.   Cardiovascular: Negative for chest pain,  palpitations and leg swelling.  Gastrointestinal: Negative for nausea, vomiting, abdominal pain and diarrhea.  Endocrine: Negative for polydipsia and polyuria.  Genitourinary: Negative for dysuria, frequency, difficulty urinating and pelvic pain.  Musculoskeletal: Negative for back pain, arthralgias, neck pain and neck stiffness.  Skin: Negative for color change, rash and wound.  Allergic/Immunologic: Negative for immunocompromised state.  Neurological: Negative for facial asymmetry, weakness, numbness and headaches.  Hematological: Does not bruise/bleed easily.  Psychiatric/Behavioral: Positive for confusion and decreased concentration. Negative for agitation.      Allergies  Decadrol; Decadron; and Sodium phosphate  Home Medications   Prior to Admission medications   Medication Sig Start Date End Date Taking? Authorizing Provider  clindamycin (CLEOCIN) 150 MG capsule Take 150 mg by mouth 3 (three) times daily.   Yes Historical Provider, MD  metFORMIN (GLUCOPHAGE) 500 MG tablet Take 500 mg by mouth daily with breakfast.    Yes Historical Provider, MD  aspirin EC 81 MG tablet Take 81 mg by mouth every morning.     Historical Provider, MD  baclofen (LIORESAL) 10 MG tablet Take 10 mg by mouth 3 (three) times daily.    Historical Provider, MD  Choline Fenofibrate (TRILIPIX) 135 MG capsule Take 135 mg by mouth every morning.     Historical Provider, MD  ezetimibe (ZETIA) 10 MG tablet Take 10 mg by mouth every morning.     Historical Provider, MD  feeding supplement, ENSURE, (ENSURE) PUDG Take 1 Container by mouth 2 (two) times daily between meals. 03/17/14   Nishant Dhungel,  MD  gabapentin (NEURONTIN) 300 MG capsule Take 300 mg by mouth 3 (three) times daily.    Historical Provider, MD  LORazepam (ATIVAN) 1 MG tablet Take 0.5 tablets (0.5 mg total) by mouth every 12 (twelve) hours as needed for anxiety. 04/18/14   Meredeth IdeGagan S Lama, MD  magnesium oxide (MAG-OX) 400 MG tablet Take 400 mg by mouth  2 (two) times daily.    Historical Provider, MD  Multiple Vitamin (MULTIVITAMIN WITH MINERALS) TABS tablet Take 1 tablet by mouth every morning.     Historical Provider, MD  omeprazole (PRILOSEC) 20 MG capsule Take 20 mg by mouth every morning.     Historical Provider, MD  OxyCODONE (OXYCONTIN) 20 mg T12A 12 hr tablet Take 1 tablet (20 mg total) by mouth 2 (two) times daily. 04/18/14   Meredeth IdeGagan S Lama, MD  polyethylene glycol (MIRALAX / GLYCOLAX) packet Take 17 g by mouth daily as needed for mild constipation.    Historical Provider, MD  potassium chloride SA (K-DUR,KLOR-CON) 20 MEQ tablet Take 1 tablet (20 mEq total) by mouth 2 (two) times daily. 04/18/14   Meredeth IdeGagan S Lama, MD  promethazine (PHENERGAN) 25 MG tablet Take 25 mg by mouth every 6 (six) hours as needed for nausea or vomiting.    Historical Provider, MD  spironolactone (ALDACTONE) 25 MG tablet Take 1 tablet (25 mg total) by mouth daily. Patient not taking: Reported on 08/25/2014 04/18/14   Meredeth IdeGagan S Lama, MD  tamsulosin (FLOMAX) 0.4 MG CAPS capsule Take 0.4 mg by mouth every morning.     Historical Provider, MD  torsemide (DEMADEX) 20 MG tablet Take 20 mg by mouth every morning.     Historical Provider, MD  Vortioxetine HBr (BRINTELLIX) 10 MG TABS Take 10 mg by mouth daily.     Historical Provider, MD  zolpidem (AMBIEN) 5 MG tablet Take 5 mg by mouth at bedtime as needed for sleep.    Historical Provider, MD   BP 123/52 mmHg  Pulse 104  Temp(Src) 102.8 F (39.3 C) (Rectal)  Resp 16  Ht 5' (1.524 m)  Wt 162 lb (73.483 kg)  BMI 31.64 kg/m2  SpO2 98% Physical Exam  Constitutional: She is oriented to person, place, and time. She appears well-developed and well-nourished. No distress.  HENT:  Head: Normocephalic and atraumatic.  Mouth/Throat: No oropharyngeal exudate.  Eyes: Pupils are equal, round, and reactive to light.  Neck: Normal range of motion. Neck supple.  Cardiovascular: Normal rate, regular rhythm and normal heart sounds.   Exam reveals no gallop and no friction rub.   No murmur heard. Pulmonary/Chest: Effort normal and breath sounds normal. No respiratory distress. She has no wheezes. She has no rales.  Abdominal: Soft. Bowel sounds are normal. She exhibits no distension and no mass. There is no tenderness. There is no rebound and no guarding.  Musculoskeletal: Normal range of motion. She exhibits no edema or tenderness.  Neurological: She is alert and oriented to person, place, and time.  Tremor in BLUE which son says is unchanged  Skin: Skin is warm and dry.  Hyperpigmentation to BL feet, with prolonged cap refill. Small area of necrosis to 2nd toe  Psychiatric: She has a normal mood and affect.  Vitals reviewed.   ED Course  Procedures (including critical care time) Labs Review Labs Reviewed  CBC WITH DIFFERENTIAL/PLATELET - Abnormal; Notable for the following:    Neutrophils Relative % 88 (*)    Neutro Abs 9.1 (*)    Lymphocytes Relative 7 (*)  Monocytes Relative 2 (*)    All other components within normal limits  COMPREHENSIVE METABOLIC PANEL - Abnormal; Notable for the following:    Glucose, Bld 187 (*)    GFR calc non Af Amer 86 (*)    All other components within normal limits  I-STAT CG4 LACTIC ACID, ED - Abnormal; Notable for the following:    Lactic Acid, Venous 4.29 (*)    All other components within normal limits  I-STAT CG4 LACTIC ACID, ED - Abnormal; Notable for the following:    Lactic Acid, Venous 3.71 (*)    All other components within normal limits  CULTURE, BLOOD (ROUTINE X 2)  CULTURE, BLOOD (ROUTINE X 2)  URINE CULTURE  URINALYSIS, ROUTINE W REFLEX MICROSCOPIC  TROPONIN I  I-STAT CG4 LACTIC ACID, ED    Imaging Review Dg Chest Port 1 View  08/25/2014   CLINICAL DATA:  Altered mental status, UTI; Hx from pt family member--pt has become very lethargic and only responds to some questions by saying "yes"; hx HTN, former smoker, quadriplegia  EXAM: PORTABLE CHEST - 1 VIEW   COMPARISON:  04/16/2014  FINDINGS: Cardiac silhouette normal in size and configuration. No mediastinal or hilar masses or evidence of adenopathy.  Mild linear opacity in the medial left lung base, most likely atelectasis. Lungs are otherwise clear. No pleural effusion or pneumothorax.  Bony thorax is demineralized but grossly intact.  IMPRESSION: No acute cardiopulmonary disease.   Electronically Signed   By: Amie Portland M.D.   On: 08/25/2014 20:17     EKG Interpretation None       CRITICAL CARE Performed by: Toy Cookey, E Total critical care time: 40 Critical care time was exclusive of separately billable procedures and treating other patients. Critical care was necessary to treat or prevent imminent or life-threatening deterioration. Critical care was time spent personally by me on the following activities: development of treatment plan with patient and/or surrogate as well as nursing, discussions with consultants, evaluation of patient's response to treatment, examination of patient, obtaining history from patient or surrogate, ordering and performing treatments and interventions, ordering and review of laboratory studies, ordering and review of radiographic studies, pulse oximetry and re-evaluation of patient's condition.   MDM   Final diagnoses:  Altered mental status, unspecified altered mental status type  Sepsis, due to unspecified organism    Pt is a 69 y.o. female with Pmhx as above who presents with worsening altered mental status since this afternoon.  At time of patient arrival she meets could sepsis criteria, which was initiated given her temperature 104.7, heart rate of 125.  She has been on antibiotics recently for UTI and was planning to finish her 2 week course tomorrow.  Son states she developed a fever this afternoon and has gradually become more and more unresponsive.  On physical exam.  She will intermittently answer short questions with 1 or 2 word phrases. She  has had similar presentations with UTIs in the past.   Patient's lactate is elevated as slightly improved after 2 L normal saline.  Heart rate is also somewhat improved.  Temperature still elevated despite Tylenol and Toradol.  No source of infection on chest x-ray and urine also appears clear, however per pharmacy tech.  She had been on a 10 day course of Cipro.  Starting on 08/08/14 followed by a 7 day course of Cleocin starting on 08/15/14, and I feel that her urine may appear clear.  Based on her recent antibiotic use.  Mental status is improving and will answer in longer phrases. [facility] for admission to tele.      Toy Cookey, MD 08/25/14 [phone]

## 2014-08-25 NOTE — H&P (Signed)
[facility] Admission History and Physical       [name] MeekerLinda Bates WUJ:[id number] DOB: [birth year] DOA: 08/25/2014  Referring physician: EDP PCP: [name], Samuel, [place] [facility]:   Chief Complaint:  Fever and Lethargy  HPI: [name] [name] is a 69 y.o. female with a history of Quadriplegia, HTN, DM2, and Recurrent UTIs who was brought to the ED due to fevers and increased lethargy and worsening confusion over the past 24 hours. She has been on 2 courses of  antibioitcs for a UTI,  first with Cipro, and then has been on Clindamycin.   The information regarding her symptoms was obtained from her spouse, who reports that she behave this way when she has a UTI.   The patient is confused and can not give her history at this time.       Review of Systems: Unable to Obtain from the Patient  Past Medical History  Diagnosis Date  . GERD (gastroesophageal reflux disease)   . Quadriplegia   . Hypertension   . Pressure ulcer of foot   . Chronic pain disorder   . Hyperlipidemia      Past Surgical History  Procedure Laterality Date  . Back surgery  08/2013  . Abdominal hysterectomy        Prior to Admission medications   Medication Sig Start Date End Date Taking? Authorizing Provider  clindamycin (CLEOCIN) 150 MG capsule Take 150 mg by mouth 3 (three) times daily.   Yes Historical Provider, MD  metFORMIN (GLUCOPHAGE) 500 MG tablet Take 500 mg by mouth daily with breakfast.    Yes Historical Provider, MD  aspirin EC 81 MG tablet Take 81 mg by mouth every morning.     Historical Provider, MD  baclofen (LIORESAL) 10 MG tablet Take 10 mg by mouth 3 (three) times daily.    Historical Provider, MD  Choline Fenofibrate (TRILIPIX) 135 MG capsule Take 135 mg by mouth every morning.     Historical Provider, MD  ezetimibe (ZETIA) 10 MG tablet Take 10 mg by mouth every morning.     Historical Provider, MD  feeding supplement, ENSURE, (ENSURE) PUDG Take 1 Container by mouth 2 (two) times  daily between meals. 03/17/14   Nishant Dhungel, MD  gabapentin (NEURONTIN) 300 MG capsule Take 300 mg by mouth 3 (three) times daily.    Historical Provider, MD  LORazepam (ATIVAN) 1 MG tablet Take 0.5 tablets (0.5 mg total) by mouth every 12 (twelve) hours as needed for anxiety. 04/18/14   Meredeth IdeGagan S Lama, MD  magnesium oxide (MAG-OX) 400 MG tablet Take 400 mg by mouth 2 (two) times daily.    Historical Provider, MD  Multiple Vitamin (MULTIVITAMIN WITH MINERALS) TABS tablet Take 1 tablet by mouth every morning.     Historical Provider, MD  omeprazole (PRILOSEC) 20 MG capsule Take 20 mg by mouth every morning.     Historical Provider, MD  OxyCODONE (OXYCONTIN) 20 mg T12A 12 hr tablet Take 1 tablet (20 mg total) by mouth 2 (two) times daily. 04/18/14   Meredeth IdeGagan S Lama, MD  polyethylene glycol (MIRALAX / GLYCOLAX) packet Take 17 g by mouth daily as needed for mild constipation.    Historical Provider, MD  potassium chloride SA (K-DUR,KLOR-CON) 20 MEQ tablet Take 1 tablet (20 mEq total) by mouth 2 (two) times daily. 04/18/14   Meredeth IdeGagan S Lama, MD  promethazine (PHENERGAN) 25 MG tablet Take 25 mg by mouth every 6 (six) hours as needed for nausea or vomiting.  Historical Provider, MD  spironolactone (ALDACTONE) 25 MG tablet Take 1 tablet (25 mg total) by mouth daily. Patient not taking: Reported on 08/25/2014 04/18/14   Meredeth Ide, MD  tamsulosin (FLOMAX) 0.4 MG CAPS capsule Take 0.4 mg by mouth every morning.     Historical Provider, MD  torsemide (DEMADEX) 20 MG tablet Take 20 mg by mouth every morning.     Historical Provider, MD  Vortioxetine HBr (BRINTELLIX) 10 MG TABS Take 10 mg by mouth daily.     Historical Provider, MD  zolpidem (AMBIEN) 5 MG tablet Take 5 mg by mouth at bedtime as needed for sleep.    Historical Provider, MD     Allergies  Allergen Reactions  . Decadrol [Dexamethasone] Swelling  . Decadron [Dexamethasone] Swelling  . Sodium Phosphate Swelling    Social History:  reports that  she has quit smoking. Her smoking use included Cigarettes. She smoked 0.25 packs per day. She has never used smokeless tobacco. She reports that she does not drink alcohol or use illicit drugs.    Family History  Problem Relation Age of Onset  . Adopted: Yes       Physical Exam:  GEN:  Obtunded, Quadriplegic  69 y.o.  Caucasian female examined and in no acute distress; cooperative with exam Filed Vitals:   08/25/14 2200 08/25/14 2215 08/25/14 2259 08/25/14 2330  BP: 161/62 177/140 123/52 99/40  Pulse: 112 113 104 99  Temp:   102.8 F (39.3 C)   TempSrc:   Rectal   Resp: Height:      Weight:      SpO2:   98% 99%   Blood pressure 99/40, pulse 99, temperature 102.8 F (39.3 C), temperature source Rectal, resp. rate 21, height 5' (1.524 m), weight 73.483 kg (162 lb), SpO2 99 %. PSYCH: She is alert and oriented x 2,  Obtunded HEENT: Normocephalic and Atraumatic, Mucous membranes pink; PERRLA; EOM intact; Fundi:  Benign;  No scleral icterus, Nares: Patent, Oropharynx: Clear, Edentulous with Dentures Present,    Neck:  FROM, No Cervical Lymphadenopathy nor Thyromegaly or Carotid Bruit; No JVD; Breasts:: Not examined CHEST WALL: No tenderness CHEST: Normal respiration, clear to auscultation bilaterally HEART: Regular rate and rhythm; no murmurs rubs or gallops BACK: No kyphosis or scoliosis; No CVA tenderness ABDOMEN: Positive Bowel Sounds, Soft Non-Tender, No Rebound or Guarding; No Masses, No Organomegaly Rectal Exam: Not done EXTREMITIES: + atrophy of BLEs,  No Cyanosis, Clubbing, or Edema Genitalia: not examined PULSES: 2+ and symmetric SKIN: Normal hydration no rash, No open Ulcerations, +scars on BLEs  CNS:  Alert and Oriented x 2 Obtunded, Paraplegic/Quadriplegic Vascular: pulses palpable throughout    Labs on Admission:  Basic Metabolic Panel:  Recent Labs Lab 08/25/14 1934  NA 139  K 4.0  CL 99  CO2 26  GLUCOSE 187*  BUN 16  CREATININE 0.70    CALCIUM 9.0   Liver Function Tests:  Recent Labs Lab 08/25/14 1934  AST 31  ALT 25  ALKPHOS 84  BILITOT 0.4  PROT 7.2  ALBUMIN 3.7   No results for input(s): LIPASE, AMYLASE in the last 168 hours. No results for input(s): AMMONIA in the last 168 hours. CBC:  Recent Labs Lab 08/25/14 1934  WBC 10.4  NEUTROABS 9.1*  HGB 14.4  HCT 44.0  MCV 86.3  PLT 330   Cardiac Enzymes:  Recent Labs Lab 08/25/14 1934  TROPONINI <0.03    BNP (last 3 results) No  results for input(s): BNP in the last 8760 hours.  ProBNP (last 3 results) No results for input(s): PROBNP in the last 8760 hours.  CBG: No results for input(s): GLUCAP in the last 168 hours.  Radiological Exams on Admission: Dg Chest Port 1 View  08/25/2014   CLINICAL DATA:  Altered mental status, UTI; Hx from pt family member--pt has become very lethargic and only responds to some questions by saying "yes"; hx HTN, former smoker, quadriplegia  EXAM: PORTABLE CHEST - 1 VIEW  COMPARISON:  04/16/2014  FINDINGS: Cardiac silhouette normal in size and configuration. No mediastinal or hilar masses or evidence of adenopathy.  Mild linear opacity in the medial left lung base, most likely atelectasis. Lungs are otherwise clear. No pleural effusion or pneumothorax.  Bony thorax is demineralized but grossly intact.  IMPRESSION: No acute cardiopulmonary disease.   Electronically Signed   By: Amie Portland M.D.   On: 08/25/2014 20:17     EKG: Independently reviewed.    Assessment/Plan:   69 y.o. female with  Principal Problem:   1.    Sepsis- Fever, Tachycardia, Lactic Acidosis, and partially treated UTI   Blood cultures and Urine Cultures sent   IV Vancomycin and Zosyn   Active Problems:   2.   UTI (lower urinary tract infection)- Partially Treated  With Clindamycin and Cipro   Urine C+S sent   On IV Vancomycin and Zosyn          3.   Metabolic encephalopathy/ Altered mental state- due to #1, and #2        4.    Paraplegia following spinal cord injury- chronic     5.   Diabetes-   Hold Metformin   SSI coverage PRN   Check HbA1C in AM.       6.   Chronic pain disorder    Home Pain Rx Oxycontin 20 mg       7.   DVT Prophylaxis    Lovenox      Code Status:  FULL CODE    Family Communication:    No Family Present Disposition Plan:    Inpatient Telemetry Unit     Time spent:  31 Minutes  Ron Parker [facility] Pager [phone]   If 7AM -7PM Please Contact the Day Rounding Team MD for [facility]  If 7PM-7AM, Please Contact Night-Floor Coverage  [email or web address] Password [email or web address] 08/25/2014, 11:33 PM     ADDENDUM:   Patient was seen and examined on 08/25/2014

## 2014-08-25 NOTE — ED Notes (Signed)
Per EMS, Pt spouse became concerned d/t pt becoming more lethargic. She slept mostly all day yesterday as well as today.Pt will not answer questions directly. She has been responding "yes" to each question asked. EMS reports AMS. Pt does respond to pain, HR has been tachy around 120

## 2014-08-26 ENCOUNTER — Encounter (HOSPITAL_COMMUNITY): Payer: Self-pay

## 2014-08-26 DIAGNOSIS — R404 Transient alteration of awareness: Secondary | ICD-10-CM

## 2014-08-26 LAB — BASIC METABOLIC PANEL
Anion gap: 10 (ref 5–15)
BUN: 14 mg/dL (ref 6–23)
CALCIUM: 7.7 mg/dL — AB (ref 8.4–10.5)
CO2: 24 mmol/L (ref 19–32)
CREATININE: 0.63 mg/dL (ref 0.50–1.10)
Chloride: 105 mmol/L (ref 96–112)
GFR calc Af Amer: >90 mL/min (ref >90)
GFR, EST NON AFRICAN AMERICAN: 89 mL/min — AB (ref >90)
Glucose, Bld: 139 mg/dL — ABNORMAL HIGH (ref 70–99)
Potassium: 3.7 mmol/L (ref 3.5–5.1)
SODIUM: 139 mmol/L (ref 135–145)

## 2014-08-26 LAB — CBC
HCT: 37.1 % (ref 36.0–46.0)
Hemoglobin: 12 g/dL (ref 12.0–15.0)
MCH: 27.9 pg (ref 26.0–34.0)
MCHC: 32.3 g/dL (ref 30.0–36.0)
MCV: 86.3 fL (ref 78.0–100.0)
PLATELETS: 297 10*3/uL (ref 150–400)
RBC: 4.3 MIL/uL (ref 3.87–5.11)
RDW: 14.4 % (ref 11.5–15.5)
WBC: 9.8 10*3/uL (ref 4.0–10.5)

## 2014-08-26 LAB — MRSA PCR SCREENING: MRSA BY PCR: NEGATIVE

## 2014-08-26 LAB — GLUCOSE, CAPILLARY
GLUCOSE-CAPILLARY: 107 mg/dL — AB (ref 70–99)
Glucose-Capillary: 137 mg/dL — ABNORMAL HIGH (ref 70–99)
Glucose-Capillary: 100 mg/dL — ABNORMAL HIGH (ref 70–99)
Glucose-Capillary: 143 mg/dL — ABNORMAL HIGH (ref 70–99)

## 2014-08-26 MED ORDER — OXYCODONE HCL ER 20 MG PO T12A
20.0000 mg | EXTENDED_RELEASE_TABLET | Freq: Two times a day (BID) | ORAL | Status: DC
  Administered 2014-08-26 – 2014-08-29 (×8): 20 mg via ORAL
  Filled 2014-08-26 (×8): qty 1

## 2014-08-26 MED ORDER — ONDANSETRON HCL 4 MG/2ML IJ SOLN
4.0000 mg | Freq: Four times a day (QID) | INTRAMUSCULAR | Status: DC | PRN
  Administered 2014-08-27 – 2014-08-29 (×5): 4 mg via INTRAVENOUS
  Filled 2014-08-26 (×5): qty 2

## 2014-08-26 MED ORDER — CETYLPYRIDINIUM CHLORIDE 0.05 % MT LIQD
7.0000 mL | Freq: Two times a day (BID) | OROMUCOSAL | Status: DC
  Administered 2014-08-26 – 2014-08-29 (×7): 7 mL via OROMUCOSAL

## 2014-08-26 MED ORDER — SODIUM CHLORIDE 0.9 % IV SOLN
INTRAVENOUS | Status: DC
  Administered 2014-08-26 – 2014-08-28 (×4): via INTRAVENOUS

## 2014-08-26 MED ORDER — BACLOFEN 10 MG PO TABS
10.0000 mg | ORAL_TABLET | Freq: Three times a day (TID) | ORAL | Status: DC
  Administered 2014-08-26 – 2014-08-29 (×10): 10 mg via ORAL
  Filled 2014-08-26 (×10): qty 1

## 2014-08-26 MED ORDER — VANCOMYCIN HCL IN DEXTROSE 1-5 GM/200ML-% IV SOLN
1000.0000 mg | Freq: Two times a day (BID) | INTRAVENOUS | Status: DC
  Administered 2014-08-26 – 2014-08-27 (×3): 1000 mg via INTRAVENOUS
  Filled 2014-08-26 (×3): qty 200

## 2014-08-26 MED ORDER — INSULIN ASPART 100 UNIT/ML ~~LOC~~ SOLN: 0.0000 [IU] | Freq: Every day | SUBCUTANEOUS | Status: DC

## 2014-08-26 MED ORDER — SODIUM CHLORIDE 0.9 % IJ SOLN
3.0000 mL | Freq: Two times a day (BID) | INTRAMUSCULAR | Status: DC
  Administered 2014-08-29: 3 mL via INTRAVENOUS

## 2014-08-26 MED ORDER — ADULT MULTIVITAMIN W/MINERALS CH
1.0000 | ORAL_TABLET | Freq: Every morning | ORAL | Status: DC
  Administered 2014-08-26 – 2014-08-29 (×4): 1 via ORAL
  Filled 2014-08-26 (×4): qty 1

## 2014-08-26 MED ORDER — ACETAMINOPHEN 325 MG PO TABS
650.0000 mg | ORAL_TABLET | Freq: Four times a day (QID) | ORAL | Status: DC | PRN
  Administered 2014-08-26: 650 mg via ORAL
  Filled 2014-08-26: qty 2

## 2014-08-26 MED ORDER — ENOXAPARIN SODIUM 40 MG/0.4ML ~~LOC~~ SOLN
40.0000 mg | SUBCUTANEOUS | Status: DC
  Administered 2014-08-26 – 2014-08-29 (×4): 40 mg via SUBCUTANEOUS
  Filled 2014-08-26 (×4): qty 0.4

## 2014-08-26 MED ORDER — MAGNESIUM OXIDE 400 (241.3 MG) MG PO TABS
400.0000 mg | ORAL_TABLET | Freq: Two times a day (BID) | ORAL | Status: DC
  Administered 2014-08-26 – 2014-08-29 (×7): 400 mg via ORAL
  Filled 2014-08-26 (×7): qty 1

## 2014-08-26 MED ORDER — ONDANSETRON HCL 4 MG PO TABS
4.0000 mg | ORAL_TABLET | Freq: Four times a day (QID) | ORAL | Status: DC | PRN
  Administered 2014-08-28: 4 mg via ORAL
  Filled 2014-08-26: qty 1

## 2014-08-26 MED ORDER — CHLORHEXIDINE GLUCONATE 0.12 % MT SOLN
15.0000 mL | Freq: Two times a day (BID) | OROMUCOSAL | Status: DC
  Administered 2014-08-26 – 2014-08-28 (×7): 15 mL via OROMUCOSAL
  Filled 2014-08-26 (×6): qty 15

## 2014-08-26 MED ORDER — ENSURE PUDDING PO PUDG
1.0000 | Freq: Two times a day (BID) | ORAL | Status: DC
  Administered 2014-08-26 – 2014-08-28 (×4): 1 via ORAL
  Filled 2014-08-26 (×9): qty 1

## 2014-08-26 MED ORDER — ACETAMINOPHEN 650 MG RE SUPP
650.0000 mg | Freq: Four times a day (QID) | RECTAL | Status: DC | PRN
  Administered 2014-08-26 (×2): 650 mg via RECTAL
  Filled 2014-08-26 (×2): qty 1

## 2014-08-26 MED ORDER — FENOFIBRATE 160 MG PO TABS
160.0000 mg | ORAL_TABLET | Freq: Every day | ORAL | Status: DC
  Administered 2014-08-26 – 2014-08-29 (×4): 160 mg via ORAL
  Filled 2014-08-26 (×6): qty 1

## 2014-08-26 MED ORDER — INSULIN ASPART 100 UNIT/ML ~~LOC~~ SOLN: 0.0000 [IU] | Freq: Three times a day (TID) | SUBCUTANEOUS | Status: DC

## 2014-08-26 MED ORDER — LORAZEPAM 0.5 MG PO TABS
0.5000 mg | ORAL_TABLET | Freq: Two times a day (BID) | ORAL | Status: DC | PRN
  Administered 2014-08-26: 0.5 mg via ORAL
  Filled 2014-08-26: qty 1

## 2014-08-26 MED ORDER — ALUM & MAG HYDROXIDE-SIMETH 200-200-20 MG/5ML PO SUSP: 30.0000 mL | Freq: Four times a day (QID) | ORAL | Status: DC | PRN

## 2014-08-26 MED ORDER — POTASSIUM CHLORIDE CRYS ER 20 MEQ PO TBCR
20.0000 meq | EXTENDED_RELEASE_TABLET | Freq: Two times a day (BID) | ORAL | Status: DC
  Administered 2014-08-26 – 2014-08-29 (×7): 20 meq via ORAL
  Filled 2014-08-26 (×8): qty 1

## 2014-08-26 MED ORDER — POLYETHYLENE GLYCOL 3350 17 G PO PACK: 17.0000 g | PACK | Freq: Every day | ORAL | Status: DC | PRN

## 2014-08-26 MED ORDER — EZETIMIBE 10 MG PO TABS
10.0000 mg | ORAL_TABLET | Freq: Every morning | ORAL | Status: DC
  Administered 2014-08-26 – 2014-08-29 (×4): 10 mg via ORAL
  Filled 2014-08-26 (×4): qty 1

## 2014-08-26 MED ORDER — TAMSULOSIN HCL 0.4 MG PO CAPS
0.4000 mg | ORAL_CAPSULE | Freq: Every morning | ORAL | Status: DC
  Administered 2014-08-26 – 2014-08-29 (×4): 0.4 mg via ORAL
  Filled 2014-08-26 (×4): qty 1

## 2014-08-26 MED ORDER — ZOLPIDEM TARTRATE 5 MG PO TABS
5.0000 mg | ORAL_TABLET | Freq: Every evening | ORAL | Status: DC | PRN
  Administered 2014-08-27 – 2014-08-28 (×2): 5 mg via ORAL
  Filled 2014-08-26 (×2): qty 1

## 2014-08-26 MED ORDER — ASPIRIN EC 81 MG PO TBEC
81.0000 mg | DELAYED_RELEASE_TABLET | Freq: Every morning | ORAL | Status: DC
  Administered 2014-08-26 – 2014-08-29 (×4): 81 mg via ORAL
  Filled 2014-08-26 (×4): qty 1

## 2014-08-26 MED ORDER — PANTOPRAZOLE SODIUM 40 MG PO TBEC
40.0000 mg | DELAYED_RELEASE_TABLET | Freq: Every day | ORAL | Status: DC
  Administered 2014-08-26 – 2014-08-29 (×4): 40 mg via ORAL
  Filled 2014-08-26 (×4): qty 1

## 2014-08-26 MED ORDER — GABAPENTIN 300 MG PO CAPS
300.0000 mg | ORAL_CAPSULE | Freq: Three times a day (TID) | ORAL | Status: DC
  Administered 2014-08-26 – 2014-08-29 (×10): 300 mg via ORAL
  Filled 2014-08-26 (×10): qty 1

## 2014-08-26 NOTE — Progress Notes (Signed)
ANTIBIOTIC CONSULT NOTE - INITIAL  Pharmacy Consult for Vancomycin and Zosyn Indication: Sepsis  Allergies  Allergen Reactions  . Decadrol [Dexamethasone] Swelling  . Decadron [Dexamethasone] Swelling  . Sodium Phosphate Swelling    Patient Measurements: Height:  (157.5 cm) Weight: 161 lb 6 oz (73.2 kg) IBW/kg (Calculated) : 50.1  Vital Signs: Temp: 100.8 F (38.2 C) (02/20 0800) Temp Source: Oral (02/20 0800) BP: 136/50 mmHg (02/20 0800) Pulse Rate: 91 (02/20 0800) Intake/Output from previous day: 02/19 0701 - 02/20 0700 In: -  Out: 350 [Urine:350] Intake/Output from this shift:    Labs:  Recent Labs  08/25/14 1934 08/26/14 0541  WBC 10.4 9.8  HGB 14.4 12.0  PLT 330 297  CREATININE 0.70 0.63   Estimated Creatinine Clearance: 62.1 mL/min (by C-G formula based on Cr of 0.63). No results for input(s): VANCOTROUGH, VANCOPEAK, VANCORANDOM, GENTTROUGH, GENTPEAK, GENTRANDOM, TOBRATROUGH, TOBRAPEAK, TOBRARND, AMIKACINPEAK, AMIKACINTROU, AMIKACIN in the last 72 hours.   Microbiology: Recent Results (from the past 720 hour(s))  MRSA PCR Screening     Status: None   Collection Time: 08/26/14  1:36 AM  Result Value Ref Range Status   MRSA by PCR NEGATIVE NEGATIVE Final    Comment:        The GeneXpert MRSA Assay (FDA approved for NASAL specimens only), is one component of a comprehensive MRSA colonization surveillance program. It is not intended to diagnose MRSA infection nor to guide or monitor treatment for MRSA infections.     Medical History: Past Medical History  Diagnosis Date  . GERD (gastroesophageal reflux disease)   . Quadriplegia   . Hypertension   . Pressure ulcer of foot   . Chronic pain disorder   . Hyperlipidemia     Medications:  Scheduled:  . antiseptic oral rinse  7 mL Mouth Rinse q12n4p  . aspirin EC  81 mg Oral q morning - 10a  . baclofen  10 mg Oral TID  . chlorhexidine  15 mL Mouth Rinse BID  . enoxaparin (LOVENOX)  injection  40 mg Subcutaneous Q24H  . ezetimibe  10 mg Oral q morning - 10a  . feeding supplement (ENSURE)  1 Container Oral BID BM  . fenofibrate  160 mg Oral Daily  . gabapentin  300 mg Oral TID  . magnesium oxide  400 mg Oral BID  . multivitamin with minerals  1 tablet Oral q morning - 10a  . OxyCODONE  20 mg Oral BID  . pantoprazole  40 mg Oral Daily  . piperacillin-tazobactam (ZOSYN)  IV  3.375 g Intravenous Q8H  . potassium chloride SA  20 mEq Oral BID  . sodium chloride  3 mL Intravenous Q12H  . tamsulosin  0.4 mg Oral q morning - 10a  . vancomycin  1,000 mg Intravenous Q12H   Infusions:  . sodium chloride 75 mL/hr at 08/26/14 0132    Assessment: 69 y/o quadriplegic female brought in by spouse for increased lethargy. She has been on antibiotic for UTI, planned to complete two-week course 2/20 (Cipro, then Clindamycin). Patient has had multiple admissions for sepsis. Pharmacy is consulted to dose Vancomycin and Zosyn for sepsis.  2/19 >> Vanc >> 2/19 >> Zosyn >>  Tmax: 105 WBC: WNL Renal: SCr 0.63, CrCl ~ 74 N, 62 CG Lactate: 4.29 > 3.71  2/19 blood x 2: sent 2/19 urine: sent 2/20 MRSA PCR: negative  Goal of Therapy:  Vancomycin trough level 15-20 mcg/ml  Zosyn dose per renal function, indication Eradication of infection  Plan:   Change Vacomycin to 1g IV q12h per trough level/dose history on previous admissions.   Plan for Vancomycin trough level at steady state  Continue Zosyn 3.375gm IV q8h (4hr extended infusions)  Follow up renal function & cultures, clinical course   Greer PickerelJigna Dhana Totton, PharmD, BCPS Pager: [phone] 08/26/2014 8:47 AM

## 2014-08-26 NOTE — Progress Notes (Signed)
Patient's temperature 102.5 rectally, PRN tylenol given and Dr. Betti Cruzeddy notified, rechecked temp after tylenol, temp down to 99.4. Will continue to assess patient.

## 2014-08-26 NOTE — Progress Notes (Signed)
[facility] PROGRESS NOTE  [name] [name] BJY:[id number] DOB: 02-28-1946 DOA: 08/25/2014 PCP: [name], MD  Assessment/Plan:  Sepsis - Fever, tachycardia, lactic acidosis, likely due to partially treated UTI - Blood and urine culture sent and pending. - Continue IV Vancomycin and Zosyn.  UTI (lower urinary tract infection) - Partially treated with clindamycin and ciprofloxacin.  Urine culture pending.  On broad-spectrum antibiotics.  Fever - Due to above.  Metabolic encephalopathy/Altered mental state - Due to above.  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  Diabetes - Hold Metformin.  - SSI coverage PRN.  Chronic pain disorder - Home pain medication. Oxycontin 20 mg.  Generalized weakness - PT/OT  Code Status: Full code Family Communication: No family at bedside.  Patient updated. Disposition Plan: Pending.  Consultants:  None  Procedures:  None  Antibiotics:  Vancomycin and Zosyn 08/25/2014  HPI/Subjective: Patient still has altered mental status.  She continues to have fever during the day.  No other complaints.  Objective: Filed Vitals:   08/26/14 0800 08/26/14 0900 08/26/14 1116 08/26/14 1404  BP: 136/50   104/52  Pulse: 91   86  Temp: 100.8 F (38.2 C) 102.5 F (39.2 C) 99.4 F (37.4 C) 99 F (37.2 C)  TempSrc: Oral Rectal Rectal Oral  Resp:    20  Height:      Weight:      SpO2: 97%   96%    Intake/Output Summary (Last 24 hours) at 08/26/14 1748 Last data filed at 08/26/14 1703  Gross per 24 hour  Intake 1368.75 ml  Output    350 ml  Net 1018.75 ml   Filed Weights   08/25/14 1951 08/25/14 2024 08/26/14 0052  Weight: 73.483 kg (162 lb) 73.483 kg (162 lb) 73.2 kg (161 lb 6 oz)    Exam:  Physical Exam: General: Awake, Oriented only to self, not oriented to location or time, No acute distress. HEENT: EOMI. Neck: Supple CV: S1 and S2 Lungs: Clear to ascultation bilaterally Abdomen: Soft,  Nontender, Nondistended, +bowel sounds. Ext: Good pulses. Trace edema.  Data Reviewed: Basic Metabolic Panel:  Recent Labs Lab 08/25/14 1934 08/26/14 0541  NA 139 139  K 4.0 3.7  CL 99 105  CO2 26 24  GLUCOSE 187* 139*  BUN 16 14  CREATININE 0.70 0.63  CALCIUM 9.0 7.7*   Liver Function Tests:  Recent Labs Lab 08/25/14 1934  AST 31  ALT 25  ALKPHOS 84  BILITOT 0.4  PROT 7.2  ALBUMIN 3.7   No results for input(s): LIPASE, AMYLASE in the last 168 hours. No results for input(s): AMMONIA in the last 168 hours. CBC:  Recent Labs Lab 08/25/14 1934 08/26/14 0541  WBC 10.4 9.8  NEUTROABS 9.1*  --   HGB 14.4 12.0  HCT 44.0 37.1  MCV 86.3 86.3  PLT 330 297   Cardiac Enzymes:  Recent Labs Lab 08/25/14 1934  TROPONINI <0.03   BNP (last 3 results) No results for input(s): BNP in the last 8760 hours.  ProBNP (last 3 results) No results for input(s): PROBNP in the last 8760 hours.  CBG:  Recent Labs Lab 08/26/14 0948 08/26/14 1401 08/26/14 1646  GLUCAP 137* 107* 100*    Recent Results (from the past 240 hour(s))  MRSA PCR Screening     Status: None   Collection Time: 08/26/14  1:36 AM  Result Value Ref Range Status   MRSA by PCR NEGATIVE NEGATIVE Final    Comment:  The GeneXpert MRSA Assay (FDA approved for NASAL specimens only), is one component of a comprehensive MRSA colonization surveillance program. It is not intended to diagnose MRSA infection nor to guide or monitor treatment for MRSA infections.      Studies: Dg Chest Port 1 View  08/25/2014   CLINICAL DATA:  Altered mental status, UTI; Hx from pt family member--pt has become very lethargic and only responds to some questions by saying "yes"; hx HTN, former smoker, quadriplegia  EXAM: PORTABLE CHEST - 1 VIEW  COMPARISON:  04/16/2014  FINDINGS: Cardiac silhouette normal in size and configuration. No mediastinal or hilar masses or evidence of adenopathy.  Mild linear opacity in the  medial left lung base, most likely atelectasis. Lungs are otherwise clear. No pleural effusion or pneumothorax.  Bony thorax is demineralized but grossly intact.  IMPRESSION: No acute cardiopulmonary disease.   Electronically Signed   By: Amie Portlandavid  Ormond M.D.   On: 08/25/2014 20:17    Scheduled Meds: . antiseptic oral rinse  7 mL Mouth Rinse q12n4p  . aspirin EC  81 mg Oral q morning - 10a  . baclofen  10 mg Oral TID  . chlorhexidine  15 mL Mouth Rinse BID  . enoxaparin (LOVENOX) injection  40 mg Subcutaneous Q24H  . ezetimibe  10 mg Oral q morning - 10a  . feeding supplement (ENSURE)  1 Container Oral BID BM  . fenofibrate  160 mg Oral Daily  . gabapentin  300 mg Oral TID  . insulin aspart  0-5 Units Subcutaneous QHS  . insulin aspart  0-9 Units Subcutaneous TID WC  . magnesium oxide  400 mg Oral BID  . multivitamin with minerals  1 tablet Oral q morning - 10a  . OxyCODONE  20 mg Oral BID  . pantoprazole  40 mg Oral Daily  . piperacillin-tazobactam (ZOSYN)  IV  3.375 g Intravenous Q8H  . potassium chloride SA  20 mEq Oral BID  . sodium chloride  3 mL Intravenous Q12H  . tamsulosin  0.4 mg Oral q morning - 10a  . vancomycin  1,000 mg Intravenous Q12H   Continuous Infusions: . sodium chloride 75 mL/hr at 08/26/14 0132    Principal Problem:   Sepsis Active Problems:   UTI (lower urinary tract infection)   Metabolic encephalopathy   Paraplegia following spinal cord injury   Altered mental state   Diabetes   Chronic pain disorder    Rehema Muffley A, MD  [facility] Pager [phone]. If 7PM-7AM, please contact night-coverage at [email or web address], password [email or web address] 08/26/2014, 5:48 PM  LOS: 1 day

## 2014-08-27 LAB — CBC
HCT: 32.4 % — ABNORMAL LOW (ref 36.0–46.0)
HEMOGLOBIN: 10.7 g/dL — AB (ref 12.0–15.0)
MCH: 28.2 pg (ref 26.0–34.0)
MCHC: 33 g/dL (ref 30.0–36.0)
MCV: 85.3 fL (ref 78.0–100.0)
Platelets: 245 10*3/uL (ref 150–400)
RBC: 3.8 MIL/uL — AB (ref 3.87–5.11)
RDW: 14.3 % (ref 11.5–15.5)
WBC: 6.6 10*3/uL (ref 4.0–10.5)

## 2014-08-27 LAB — CLOSTRIDIUM DIFFICILE BY PCR: CDIFFPCR: NEGATIVE

## 2014-08-27 LAB — BASIC METABOLIC PANEL
Anion gap: 8 (ref 5–15)
BUN: 8 mg/dL (ref 6–23)
CO2: 25 mmol/L (ref 19–32)
CREATININE: 0.47 mg/dL — AB (ref 0.50–1.10)
Calcium: 8 mg/dL — ABNORMAL LOW (ref 8.4–10.5)
Chloride: 107 mmol/L (ref 96–112)
GFR calc Af Amer: >90 mL/min (ref >90)
GFR calc non Af Amer: >90 mL/min (ref >90)
GLUCOSE: 105 mg/dL — AB (ref 70–99)
POTASSIUM: 3.7 mmol/L (ref 3.5–5.1)
Sodium: 140 mmol/L (ref 135–145)

## 2014-08-27 LAB — GLUCOSE, CAPILLARY
GLUCOSE-CAPILLARY: 99 mg/dL (ref 70–99)
GLUCOSE-CAPILLARY: 118 mg/dL — AB (ref 70–99)
GLUCOSE-CAPILLARY: 108 mg/dL — AB (ref 70–99)
GLUCOSE-CAPILLARY: 164 mg/dL — AB (ref 70–99)

## 2014-08-27 LAB — URINE CULTURE

## 2014-08-27 NOTE — Progress Notes (Signed)
[facility] PROGRESS NOTE  Waylynn Benefiel ZOX:[id number] DOB: [birth year] DOA: 08/25/2014 PCP: [name], MD  Assessment/Plan:  Sepsis - Fever, tachycardia, lactic acidosis, likely due to partially treated UTI - Blood culture shows no growth to date.  Urine culture growing 45,000 colonies of multiple bacterial morphotypes. - Continue IV Zosyn, discontinue vancomycin.  UTI (lower urinary tract infection) - Partially treated with clindamycin and ciprofloxacin.  Urine culture growing 45,000 colonies of multiple bacterial morphotypes.  On broad-spectrum antibiotics.  Fever - Improved, patient had a maximum temperature of 100.4 within the last 24 hours.  Metabolic encephalopathy/Altered mental state - Due to above.  Significantly improved, suspect is close to baseline.  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  Diabetes - Hold Metformin.  - SSI coverage PRN.  Chronic pain disorder - Home pain medication. Oxycontin 20 mg.  Generalized weakness - PT/OT  Code Status: Full code Family Communication: No family at bedside.  Patient updated. Disposition Plan: Pending.  Consultants:  None  Procedures:  None  Antibiotics:  Vancomycin 08/25/2014 - 08/27/2014.    Zosyn 08/25/2014 --  HPI/Subjective: Patient reports that she is feeling a lot better today.  Was oriented 3.  Patient knew that today was her birthday..  Objective: Filed Vitals:   08/26/14 1959 08/26/14 2100 08/27/14 0511 08/27/14 0951  BP: 110/63  101/67 105/62  Pulse: 90  77 88  Temp: 100.4 F (38 C) 99.4 F (37.4 C) 98.4 F (36.9 C) 98.7 F (37.1 C)  TempSrc: Oral  Oral Oral  Resp: Height:      Weight:   76.4 kg (168 lb 6.9 oz)   SpO2: 100%  100% 100%    Intake/Output Summary (Last 24 hours) at 08/27/14 1231 Last data filed at 08/27/14 0900  Gross per 24 hour  Intake 3577.5 ml  Output      0 ml  Net 3577.5 ml   Filed Weights   08/25/14 2024  08/26/14 0052 08/27/14 0511  Weight: 73.483 kg (162 lb) 73.2 kg (161 lb 6 oz) 76.4 kg (168 lb 6.9 oz)    Exam:  Physical Exam: General: Awake, Oriented x3, No acute distress. HEENT: EOMI. Neck: Supple CV: S1 and S2 Lungs: Clear to ascultation bilaterally Abdomen: Soft, Nontender, Nondistended, +bowel sounds. Ext: Good pulses. Trace edema.  Data Reviewed: Basic Metabolic Panel:  Recent Labs Lab 08/25/14 1934 08/26/14 0541 08/27/14 0534  NA 139 139 140  K 4.0 3.7 3.7  CL 99 105 107  CO2 GLUCOSE 187* 139* 105*  BUN CREATININE 0.70 0.63 0.47*  CALCIUM 9.0 7.7* 8.0*   Liver Function Tests:  Recent Labs Lab 08/25/14 1934  AST 31  ALT 25  ALKPHOS 84  BILITOT 0.4  PROT 7.2  ALBUMIN 3.7   No results for input(s): LIPASE, AMYLASE in the last 168 hours. No results for input(s): AMMONIA in the last 168 hours. CBC:  Recent Labs Lab 08/25/14 1934 08/26/14 0541 08/27/14 0534  WBC 10.4 9.8 6.6  NEUTROABS 9.1*  --   --   HGB 14.4 12.0 10.7*  HCT 44.0 37.1 32.4*  MCV 86.3 86.3 85.3  PLT 330 297 245   Cardiac Enzymes:  Recent Labs Lab 08/25/14 1934  TROPONINI <0.03   BNP (last 3 results) No results for input(s): BNP in the last 8760 hours.  ProBNP (last 3 results) No results for input(s): PROBNP in the last 8760 hours.  CBG:  Recent Labs Lab 08/26/14 1401 08/26/14 1646 08/26/14 2141 08/27/14 0737 08/27/14 1141  GLUCAP 107* 100* 143* 99 118*    Recent Results (from the past 240 hour(s))  Blood Culture (routine x 2)     Status: None (Preliminary result)   Collection Time: 08/25/14  7:34 PM  Result Value Ref Range Status   Specimen Description BLOOD RIGHT FOREARM  Final   Special Requests BOTTLES DRAWN AEROBIC AND ANAEROBIC 10CC  Final   Culture   Final           BLOOD CULTURE RECEIVED NO GROWTH TO DATE CULTURE WILL BE HELD FOR 5 DAYS BEFORE ISSUING A FINAL NEGATIVE REPORT Performed at [facility]    Report Status  PENDING  Incomplete  Blood Culture (routine x 2)     Status: None (Preliminary result)   Collection Time: 08/25/14  7:34 PM  Result Value Ref Range Status   Specimen Description BLOOD LEFT FOREARM  Final   Special Requests BOTTLES DRAWN AEROBIC AND ANAEROBIC 5CC  Final   Culture   Final           BLOOD CULTURE RECEIVED NO GROWTH TO DATE CULTURE WILL BE HELD FOR 5 DAYS BEFORE ISSUING A FINAL NEGATIVE REPORT Performed at [facility]    Report Status PENDING  Incomplete  Urine culture     Status: None   Collection Time: 08/25/14  8:06 PM  Result Value Ref Range Status   Specimen Description URINE, CATHETERIZED  Final   Special Requests NONE  Final   Colony Count   Final    45,000 COLONIES/ML Performed at [facility]    Culture   Final    Multiple bacterial morphotypes present, none predominant. Suggest appropriate recollection if clinically indicated. Performed at [facility]    Report Status 08/27/2014 FINAL  Final  MRSA PCR Screening     Status: None   Collection Time: 08/26/14  1:36 AM  Result Value Ref Range Status   MRSA by PCR NEGATIVE NEGATIVE Final    Comment:        The GeneXpert MRSA Assay (FDA approved for NASAL specimens only), is one component of a comprehensive MRSA colonization surveillance program. It is not intended to diagnose MRSA infection nor to guide or monitor treatment for MRSA infections.      Studies: Dg Chest Port 1 View  08/25/2014   CLINICAL DATA:  Altered mental status, UTI; Hx from pt family member--pt has become very lethargic and only responds to some questions by saying "yes"; hx HTN, former smoker, quadriplegia  EXAM: PORTABLE CHEST - 1 VIEW  COMPARISON:  04/16/2014  FINDINGS: Cardiac silhouette normal in size and configuration. No mediastinal or hilar masses or evidence of adenopathy.  Mild linear opacity in the medial left lung base, most likely atelectasis. Lungs are otherwise clear. No pleural effusion or  pneumothorax.  Bony thorax is demineralized but grossly intact.  IMPRESSION: No acute cardiopulmonary disease.   Electronically Signed   By: Amie Portland M.D.   On: 08/25/2014 20:17    Scheduled Meds: . antiseptic oral rinse  7 mL Mouth Rinse q12n4p  . aspirin EC  81 mg Oral q morning - 10a  . baclofen  10 mg Oral TID  . chlorhexidine  15 mL Mouth Rinse BID  . enoxaparin (LOVENOX) injection  40 mg Subcutaneous Q24H  . ezetimibe  10 mg Oral q morning - 10a  . feeding supplement (ENSURE)  1 Container Oral BID  BM  . fenofibrate  160 mg Oral Daily  . gabapentin  300 mg Oral TID  . insulin aspart  0-5 Units Subcutaneous QHS  . insulin aspart  0-9 Units Subcutaneous TID WC  . magnesium oxide  400 mg Oral BID  . multivitamin with minerals  1 tablet Oral q morning - 10a  . OxyCODONE  20 mg Oral BID  . pantoprazole  40 mg Oral Daily  . piperacillin-tazobactam (ZOSYN)  IV  3.375 g Intravenous Q8H  . potassium chloride SA  20 mEq Oral BID  . sodium chloride  3 mL Intravenous Q12H  . tamsulosin  0.4 mg Oral q morning - 10a  . vancomycin  1,000 mg Intravenous Q12H   Continuous Infusions: . sodium chloride 75 mL/hr at 08/26/14 2012    Principal Problem:   Sepsis Active Problems:   UTI (lower urinary tract infection)   Metabolic encephalopathy   Paraplegia following spinal cord injury   Altered mental state   Diabetes   Chronic pain disorder    Kirstan Fentress A, MD  [facility] Pager [phone]. If 7PM-7AM, please contact night-coverage at [email or web address], password [email or web address] 08/27/2014, 12:31 PM  LOS: 2 days

## 2014-08-27 NOTE — Progress Notes (Signed)
PT Cancellation Note  Patient Details Name: [name] MeekerLinda [name] MRN: [id number] DOB: 09-Nov-1945   Cancelled Treatment:   Checked on pt twice, once at procedure and then second time pt getting bathed. Will try to assess pt in the morning. Thanks      Marella BileBRITT, Ahmed Inniss 08/27/2014, 4:44 PM

## 2014-08-27 NOTE — Progress Notes (Signed)
Utilization Review Completed.[name] [name] T2/21/2016  

## 2014-08-28 LAB — BASIC METABOLIC PANEL
Anion gap: 5 (ref 5–15)
BUN: <5 mg/dL — ABNORMAL LOW (ref 6–23)
CALCIUM: 8.5 mg/dL (ref 8.4–10.5)
CHLORIDE: 109 mmol/L (ref 96–112)
CO2: 26 mmol/L (ref 19–32)
CREATININE: 0.36 mg/dL — AB (ref 0.50–1.10)
GLUCOSE: 99 mg/dL (ref 70–99)
Potassium: 3.7 mmol/L (ref 3.5–5.1)
SODIUM: 140 mmol/L (ref 135–145)

## 2014-08-28 LAB — GLUCOSE, CAPILLARY
GLUCOSE-CAPILLARY: 93 mg/dL (ref 70–99)
GLUCOSE-CAPILLARY: 105 mg/dL — AB (ref 70–99)
GLUCOSE-CAPILLARY: 98 mg/dL (ref 70–99)
Glucose-Capillary: 110 mg/dL — ABNORMAL HIGH (ref 70–99)

## 2014-08-28 LAB — CBC
HCT: 30.7 % — ABNORMAL LOW (ref 36.0–46.0)
HEMOGLOBIN: 10.2 g/dL — AB (ref 12.0–15.0)
MCH: 28 pg (ref 26.0–34.0)
MCHC: 33.2 g/dL (ref 30.0–36.0)
MCV: 84.3 fL (ref 78.0–100.0)
Platelets: 267 10*3/uL (ref 150–400)
RBC: 3.64 MIL/uL — AB (ref 3.87–5.11)
RDW: 14.2 % (ref 11.5–15.5)
WBC: 6.9 10*3/uL (ref 4.0–10.5)

## 2014-08-28 MED ORDER — SULFAMETHOXAZOLE-TRIMETHOPRIM 800-160 MG PO TABS
1.0000 | ORAL_TABLET | Freq: Two times a day (BID) | ORAL | Status: DC
  Administered 2014-08-28 – 2014-08-29 (×3): 1 via ORAL
  Filled 2014-08-28 (×4): qty 1

## 2014-08-28 NOTE — Progress Notes (Signed)
OT Cancellation Note  Patient Details Name: [name] MeekerLinda [name] MRN: [id number] DOB: 1946/01/08   Cancelled Treatment:    Reason Eval/Treat Not Completed: OT screened, no needs identified, will sign off. Spoke with pt and son. Pt has 24/7 caregivers that assist with personal care including bathing, dressing, toileting. She states she can feed herself and has been able to do so here. They report no current OT needs.   Lennox LaityStone, [name]  [phone] 08/28/2014, 11:47 AM

## 2014-08-28 NOTE — Progress Notes (Signed)
PT Cancellation Note  Patient Details Name: [name] MeekerLinda [name] MRN: [id number] DOB: 09/01/1945   Cancelled Treatment:    Reason Eval/Treat Not Completed: PT screened, no needs identified, will sign off (Pt is at baseline of total care due to quadriplegia. At home she has 24* caregivers who use a Hoyer to transfer her to her WC. Enxcouraged pt to change positions in bed frequently for pressure relief. Will also request air mattress overlay. ) Pt/family agree that there are no PT needs at present. DC PT.    Ralene BatheUhlenberg, Gawain Crombie Kistler 08/28/2014, 11:05 AM  [phone]

## 2014-08-28 NOTE — Progress Notes (Signed)
[facility] PROGRESS NOTE  [name] [name] ZOX:[id number] DOB: December 10, 1945 DOA: 08/25/2014 PCP: [name], MD  Assessment/Plan:  Sepsis - Fever, tachycardia, lactic acidosis, likely due to partially treated UTI - Blood culture shows no growth to date.  Urine culture growing 45,000 colonies of multiple bacterial morphotypes. - Discontinue IV Zosyn, transition patient to Bactrim.  Treated empirically for 7 days total.  UTI (lower urinary tract infection) - Partially treated with clindamycin and ciprofloxacin.   - Urine culture growing 45,000 colonies of multiple bacterial morphotypes.   - Patient transitioned to Bactrim, treat empirically for [name] total of 7 days.  Fever - Improved, patient afebrile for the last 24 hours.  Metabolic encephalopathy/Altered mental state - Due to above.  Significantly improved, he is at baseline.  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  Diabetes - Hold Metformin.  - SSI coverage PRN.  Chronic pain disorder - Home pain medication. Oxycontin 20 mg.  Generalized weakness - No PT needs, at baseline.  Code Status: Full code Family Communication: Son at bedside.  Patient updated. Disposition Plan: Consider possible discharge home tomorrow.  Consultants:  None  Procedures:  None  Antibiotics:  Vancomycin 08/25/2014 - 08/27/2014.    Zosyn 08/25/2014 -- 08/28/2014  Bactrim 08/28/2014 --  HPI/Subjective: No specific concerns.  Feels much improved from admission.  Son at bedside.  Objective: Filed Vitals:   08/27/14 1500 08/27/14 2053 08/28/14 0200 08/28/14 0554  BP: 123/64 133/69 126/62 133/66  Pulse: 87 90 72 77  Temp: 98.9 F (37.2 C) 99.4 F (37.4 C) 98.5 F (36.9 C) 98.8 F (37.1 C)  TempSrc: Oral Oral Oral Oral  Resp: Height:      Weight:      SpO2: 100% 100% 99% 100%    Intake/Output Summary (Last 24 hours) at 08/28/14 1252 Last data filed at 08/28/14 0931  Gross per 24 hour   Intake   1350 ml  Output      0 ml  Net   1350 ml   Filed Weights   08/25/14 2024 08/26/14 0052 08/27/14 0511  Weight: 73.483 kg (162 lb) 73.2 kg (161 lb 6 oz) 76.4 kg (168 lb 6.9 oz)    Exam:  Physical Exam: General: Awake, Oriented x3, No acute distress. HEENT: EOMI. Neck: Supple CV: S1 and S2 Lungs: Clear to ascultation bilaterally Abdomen: Soft, Nontender, Nondistended, +bowel sounds. Ext: Good pulses. Trace edema.  Data Reviewed: Basic Metabolic Panel:  Recent Labs Lab 08/25/14 1934 08/26/14 0541 08/27/14 0534 08/28/14 0448  NA 139 139 140 140  K 4.0 3.7 3.7 3.7  CL 99 105 107 109  CO2 GLUCOSE 187* 139* 105* 99  BUN <5*  CREATININE 0.70 0.63 0.47* 0.36*  CALCIUM 9.0 7.7* 8.0* 8.5   Liver Function Tests:  Recent Labs Lab 08/25/14 1934  AST 31  ALT 25  ALKPHOS 84  BILITOT 0.4  PROT 7.2  ALBUMIN 3.7   No results for input(s): LIPASE, AMYLASE in the last 168 hours. No results for input(s): AMMONIA in the last 168 hours. CBC:  Recent Labs Lab 08/25/14 1934 08/26/14 0541 08/27/14 0534 08/28/14 0448  WBC 10.4 9.8 6.6 6.9  NEUTROABS 9.1*  --   --   --   HGB 14.4 12.0 10.7* 10.2*  HCT 44.0 37.1 32.4* 30.7*  MCV 86.3 86.3 85.3 84.3  PLT 330 297 245 267   Cardiac Enzymes:  Recent Labs Lab 08/25/14  1934  TROPONINI <0.03   BNP (last 3 results) No results for input(s): BNP in the last 8760 hours.  ProBNP (last 3 results) No results for input(s): PROBNP in the last 8760 hours.  CBG:  Recent Labs Lab 08/27/14 1141 08/27/14 1702 08/27/14 2105 08/28/14 0746 08/28/14 1133  GLUCAP 118* 108* 164* 93 110*    Recent Results (from the past 240 hour(s))  Blood Culture (routine x 2)     Status: None (Preliminary result)   Collection Time: 08/25/14  7:34 PM  Result Value Ref Range Status   Specimen Description BLOOD RIGHT FOREARM  Final   Special Requests BOTTLES DRAWN AEROBIC AND ANAEROBIC 10CC  Final   Culture    Final           BLOOD CULTURE RECEIVED NO GROWTH TO DATE CULTURE WILL BE HELD FOR 5 DAYS BEFORE ISSUING [name] FINAL NEGATIVE REPORT Performed at [facility]    Report Status PENDING  Incomplete  Blood Culture (routine x 2)     Status: None (Preliminary result)   Collection Time: 08/25/14  7:34 PM  Result Value Ref Range Status   Specimen Description BLOOD LEFT FOREARM  Final   Special Requests BOTTLES DRAWN AEROBIC AND ANAEROBIC 5CC  Final   Culture   Final           BLOOD CULTURE RECEIVED NO GROWTH TO DATE CULTURE WILL BE HELD FOR 5 DAYS BEFORE ISSUING [name] FINAL NEGATIVE REPORT Performed at [facility]    Report Status PENDING  Incomplete  Urine culture     Status: None   Collection Time: 08/25/14  8:06 PM  Result Value Ref Range Status   Specimen Description URINE, CATHETERIZED  Final   Special Requests NONE  Final   Colony Count   Final    45,000 COLONIES/ML Performed at [facility]    Culture   Final    Multiple bacterial morphotypes present, none predominant. Suggest appropriate recollection if clinically indicated. Performed at [facility]    Report Status 08/27/2014 FINAL  Final  MRSA PCR Screening     Status: None   Collection Time: 08/26/14  1:36 AM  Result Value Ref Range Status   MRSA by PCR NEGATIVE NEGATIVE Final    Comment:        The GeneXpert MRSA Assay (FDA approved for NASAL specimens only), is one component of [name] comprehensive MRSA colonization surveillance program. It is not intended to diagnose MRSA infection nor to guide or monitor treatment for MRSA infections.   Clostridium Difficile by PCR     Status: None   Collection Time: 08/26/14  8:07 PM  Result Value Ref Range Status   C difficile by pcr NEGATIVE NEGATIVE Final    Comment: Performed at [facility]     Studies: No results found.  Scheduled Meds: . antiseptic oral rinse  7 mL Mouth Rinse q12n4p  . aspirin EC  81 mg Oral q morning - 10a  .  baclofen  10 mg Oral TID  . chlorhexidine  15 mL Mouth Rinse BID  . enoxaparin (LOVENOX) injection  40 mg Subcutaneous Q24H  . ezetimibe  10 mg Oral q morning - 10a  . feeding supplement (ENSURE)  1 Container Oral BID BM  . fenofibrate  160 mg Oral Daily  . gabapentin  300 mg Oral TID  . insulin aspart  0-5 Units Subcutaneous QHS  . insulin aspart  0-9 Units Subcutaneous TID WC  .  magnesium oxide  400 mg Oral BID  . multivitamin with minerals  1 tablet Oral q morning - 10a  . OxyCODONE  20 mg Oral BID  . pantoprazole  40 mg Oral Daily  . piperacillin-tazobactam (ZOSYN)  IV  3.375 g Intravenous Q8H  . potassium chloride SA  20 mEq Oral BID  . sodium chloride  3 mL Intravenous Q12H  . sulfamethoxazole-trimethoprim  1 tablet Oral Q12H  . tamsulosin  0.4 mg Oral q morning - 10a   Continuous Infusions: . sodium chloride 75 mL/hr at 08/28/14 0719    Principal Problem:   Sepsis Active Problems:   UTI (lower urinary tract infection)   Metabolic encephalopathy   Paraplegia following spinal cord injury   Altered mental state   Diabetes   Chronic pain disorder    [name] Fritcher A, MD  [facility] Pager [phone]. If 7PM-7AM, please contact night-coverage at [email or web address], password [email or web address] 08/28/2014, 12:52 PM  LOS: 3 days

## 2014-08-29 LAB — BASIC METABOLIC PANEL
Anion gap: 9 (ref 5–15)
CALCIUM: 9 mg/dL (ref 8.4–10.5)
CO2: 24 mmol/L (ref 19–32)
CREATININE: 0.45 mg/dL — AB (ref 0.50–1.10)
Chloride: 109 mmol/L (ref 96–112)
GLUCOSE: 90 mg/dL (ref 70–99)
POTASSIUM: 4 mmol/L (ref 3.5–5.1)
Sodium: 142 mmol/L (ref 135–145)

## 2014-08-29 LAB — CBC
HCT: 33.3 % — ABNORMAL LOW (ref 36.0–46.0)
HEMOGLOBIN: 11 g/dL — AB (ref 12.0–15.0)
MCH: 28.1 pg (ref 26.0–34.0)
MCHC: 33 g/dL (ref 30.0–36.0)
MCV: 84.9 fL (ref 78.0–100.0)
Platelets: 321 10*3/uL (ref 150–400)
RBC: 3.92 MIL/uL (ref 3.87–5.11)
RDW: 14.5 % (ref 11.5–15.5)
WBC: 7.7 10*3/uL (ref 4.0–10.5)

## 2014-08-29 LAB — GLUCOSE, CAPILLARY
Glucose-Capillary: 94 mg/dL (ref 70–99)
Glucose-Capillary: 117 mg/dL — ABNORMAL HIGH (ref 70–99)

## 2014-08-29 MED ORDER — SULFAMETHOXAZOLE-TRIMETHOPRIM 800-160 MG PO TABS: 1.0000 | ORAL_TABLET | Freq: Two times a day (BID) | ORAL | Status: DC

## 2014-08-29 NOTE — Progress Notes (Signed)
D/C instructions reviewed w/ pt and son, both verbalize understanding and all questions answered. Pt d/c in stable condition in her own w/c to w/c Zenaida Niecevan awaiting her. Driver friendly with pt and son and came up to room to pick her up. Pt in possession of d/c instructions, script, and all personal belongings.

## 2014-08-29 NOTE — Discharge Summary (Signed)
Physician Discharge Summary  [name] MeekerLinda Berhane NGE:[id number] DOB: August 08, 1945 DOA: 08/25/2014  PCP: [name], Samuel, MD  Admit date: 08/25/2014 Discharge date: 08/29/2014  Time spent: 35 minutes  Recommendations for Outpatient Follow-up:  1. Follow up with Dr. Benancio DeedsNewsome in 2 weeks 2. Continue Bactrim for 6 additional days   Discharge Diagnoses:  Principal Problem:   Sepsis Active Problems:   UTI (lower urinary tract infection)   Metabolic encephalopathy   Paraplegia following spinal cord injury   Altered mental state   Diabetes   Chronic pain disorder  Discharge Condition: stable  Diet recommendation: regular  Filed Weights   08/25/14 2024 08/26/14 0052 08/27/14 0511  Weight: 73.483 kg (162 lb) 73.2 kg (161 lb 6 oz) 76.4 kg (168 lb 6.9 oz)   History of present illness:  [name] [name] is a 69 y.o. female with a history of Quadriplegia, HTN, DM2, and Recurrent UTIs who was brought to the ED due to fevers and increased lethargy and worsening confusion over the past 24 hours. She has been on 2 courses of antibioitcs for a UTI, first with Cipro, and then has been on Clindamycin. The information regarding her symptoms was obtained from her spouse, who reports that she behave this way when she has a UTI. The patient is confused and can not give her history at this time.   Hospital Course:  Sepsis - Fever, tachycardia, lactic acidosis, likely due to partially treated UTI - Blood culture shows no growth to date. Urine culture growing 45,000 colonies of multiple bacterial morphotypes. - Discontinued IV Zosyn, transition patient to Bactrim. Treated empirically for 10 days with 6 remaining UTI (lower urinary tract infection) - Partially treated with clindamycin and ciprofloxacin.  - Urine culture growing 45,000 colonies of multiple bacterial morphotypes.  - Patient transitioned to Bactrim as above Fever - Improved, patient afebrile now Metabolic encephalopathy/Altered mental  state - Due to above. Significantly improved, he is at baseline. 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 Diabetes - Hold Metformin.  - SSI coverage PRN. Chronic pain disorder - Home pain medication. Oxycontin 20 mg. Generalized weakness - No PT needs, at baseline.  Procedures:  None    Consultations:  None   Discharge Exam: Filed Vitals:   08/28/14 1818 08/28/14 2209 08/29/14 0220 08/29/14 0452  BP: 133/58 126/47 134/56 140/63  Pulse: 97 75 68 72  Temp: 99.7 F (37.6 C) 98.6 F (37 C) 98.4 F (36.9 C) 99 F (37.2 C)  TempSrc: Oral Oral Oral Oral  Resp:  20 20 20   Height:      Weight:      SpO2: 100% 100% 100% 100%    General: NAD Cardiovascular: RRR Respiratory: CTA biL  Discharge Instructions     Medication List    STOP taking these medications        clindamycin 150 MG capsule  Commonly known as:  CLEOCIN      TAKE these medications        aspirin EC 81 MG tablet  Take 81 mg by mouth every morning.     baclofen 10 MG tablet  Commonly known as:  LIORESAL  Take 10 mg by mouth 3 (three) times daily.     BRINTELLIX 10 MG Tabs  Generic drug:  Vortioxetine HBr  Take 10 mg by mouth daily.     ezetimibe 10 MG tablet  Commonly known as:  ZETIA  Take 10 mg by mouth every morning.     feeding supplement (ENSURE) Pudg  Take 1 Container by mouth 2 (two) times daily between meals.     gabapentin 300 MG capsule  Commonly known as:  NEURONTIN  Take 300 mg by mouth 3 (three) times daily.     LORazepam 1 MG tablet  Commonly known as:  ATIVAN  Take 0.5 tablets (0.5 mg total) by mouth every 12 (twelve) hours as needed for anxiety.     magnesium oxide 400 MG tablet  Commonly known as:  MAG-OX  Take 400 mg by mouth 2 (two) times daily.     metFORMIN 500 MG tablet  Commonly known as:  GLUCOPHAGE  Take 500 mg by mouth daily with breakfast.     multivitamin with minerals Tabs tablet  Take 1 tablet by mouth every morning.      omeprazole 20 MG capsule  Commonly known as:  PRILOSEC  Take 20 mg by mouth every morning.     OxyCODONE 20 mg T12a 12 hr tablet  Commonly known as:  OXYCONTIN  Take 1 tablet (20 mg total) by mouth 2 (two) times daily.     polyethylene glycol packet  Commonly known as:  MIRALAX / GLYCOLAX  Take 17 g by mouth daily as needed for mild constipation.     potassium chloride SA 20 MEQ tablet  Commonly known as:  K-DUR,KLOR-CON  Take 1 tablet (20 mEq total) by mouth 2 (two) times daily.     promethazine 25 MG tablet  Commonly known as:  PHENERGAN  Take 25 mg by mouth every 6 (six) hours as needed for nausea or vomiting.     spironolactone 25 MG tablet  Commonly known as:  ALDACTONE  Take 1 tablet (25 mg total) by mouth daily.     sulfamethoxazole-trimethoprim 800-160 MG per tablet  Commonly known as:  BACTRIM DS,SEPTRA DS  Take 1 tablet by mouth every 12 (twelve) hours.     tamsulosin 0.4 MG Caps capsule  Commonly known as:  FLOMAX  Take 0.4 mg by mouth every morning.     torsemide 20 MG tablet  Commonly known as:  DEMADEX  Take 20 mg by mouth every morning.     TRILIPIX 135 MG capsule  Generic drug:  Choline Fenofibrate  Take 135 mg by mouth every morning.     zolpidem 5 MG tablet  Commonly known as:  AMBIEN  Take 5 mg by mouth at bedtime as needed for sleep.           Follow-up Information    Follow up with [name], MD. Schedule an appointment as soon as possible for a visit in 2 weeks.   Specialty:  Family Medicine   Contact information:   [address] [place] [address] [phone]       The results of significant diagnostics from this hospitalization (including imaging, microbiology, ancillary and laboratory) are listed below for reference.    Significant Diagnostic Studies: Dg Chest Port 1 View  08/25/2014   CLINICAL DATA:  Altered mental status, UTI; Hx from pt family member--pt has become very lethargic and only responds to some questions by  saying "yes"; hx HTN, former smoker, quadriplegia  EXAM: PORTABLE CHEST - 1 VIEW  COMPARISON:  04/16/2014  FINDINGS: Cardiac silhouette normal in size and configuration. No mediastinal or hilar masses or evidence of adenopathy.  Mild linear opacity in the medial left lung base, most likely atelectasis. Lungs are otherwise clear. No pleural effusion or pneumothorax.  Bony thorax is demineralized but grossly intact.  IMPRESSION: No  acute cardiopulmonary disease.   Electronically Signed   By: Amie Portland M.D.   On: 08/25/2014 20:17    Microbiology: Recent Results (from the past 240 hour(s))  Blood Culture (routine x 2)     Status: None (Preliminary result)   Collection Time: 08/25/14  7:34 PM  Result Value Ref Range Status   Specimen Description BLOOD RIGHT FOREARM  Final   Special Requests BOTTLES DRAWN AEROBIC AND ANAEROBIC 10CC  Final   Culture   Final           BLOOD CULTURE RECEIVED NO GROWTH TO DATE CULTURE WILL BE HELD FOR 5 DAYS BEFORE ISSUING A FINAL NEGATIVE REPORT Performed at [facility]    Report Status PENDING  Incomplete  Blood Culture (routine x 2)     Status: None (Preliminary result)   Collection Time: 08/25/14  7:34 PM  Result Value Ref Range Status   Specimen Description BLOOD LEFT FOREARM  Final   Special Requests BOTTLES DRAWN AEROBIC AND ANAEROBIC 5CC  Final   Culture   Final           BLOOD CULTURE RECEIVED NO GROWTH TO DATE CULTURE WILL BE HELD FOR 5 DAYS BEFORE ISSUING A FINAL NEGATIVE REPORT Performed at [facility]    Report Status PENDING  Incomplete  Urine culture     Status: None   Collection Time: 08/25/14  8:06 PM  Result Value Ref Range Status   Specimen Description URINE, CATHETERIZED  Final   Special Requests NONE  Final   Colony Count   Final    45,000 COLONIES/ML Performed at [facility]    Culture   Final    Multiple bacterial morphotypes present, none predominant. Suggest appropriate recollection if clinically  indicated. Performed at [facility]    Report Status 08/27/2014 FINAL  Final  MRSA PCR Screening     Status: None   Collection Time: 08/26/14  1:36 AM  Result Value Ref Range Status   MRSA by PCR NEGATIVE NEGATIVE Final    Comment:        The GeneXpert MRSA Assay (FDA approved for NASAL specimens only), is one component of a comprehensive MRSA colonization surveillance program. It is not intended to diagnose MRSA infection nor to guide or monitor treatment for MRSA infections.   Clostridium Difficile by PCR     Status: None   Collection Time: 08/26/14  8:07 PM  Result Value Ref Range Status   C difficile by pcr NEGATIVE NEGATIVE Final    Comment: Performed at [facility]     Labs: Basic Metabolic Panel:  Recent Labs Lab 08/25/14 1934 08/26/14 0541 08/27/14 0534 08/28/14 0448 08/29/14 0436  NA 139 139 140 140 142  K 4.0 3.7 3.7 3.7 4.0  CL 99 105 107 109 109  CO2 GLUCOSE 187* 139* 105* 99 90  BUN <5* <5*  CREATININE 0.70 0.63 0.47* 0.36* 0.45*  CALCIUM 9.0 7.7* 8.0* 8.5 9.0   Liver Function Tests:  Recent Labs Lab 08/25/14 1934  AST 31  ALT 25  ALKPHOS 84  BILITOT 0.4  PROT 7.2  ALBUMIN 3.7   No results for input(s): LIPASE, AMYLASE in the last 168 hours. No results for input(s): AMMONIA in the last 168 hours. CBC:  Recent Labs Lab 08/25/14 1934 08/26/14 0541 08/27/14 0534 08/28/14 0448 08/29/14 0436  WBC 10.4 9.8 6.6 6.9 7.7  NEUTROABS 9.1*  --   --   --   --  HGB 14.4 12.0 10.7* 10.2* 11.0*  HCT 44.0 37.1 32.4* 30.7* 33.3*  MCV 86.3 86.3 85.3 84.3 84.9  PLT 330 297 245 267 321   Cardiac Enzymes:  Recent Labs Lab 08/25/14 1934  TROPONINI <0.03    CBG:  Recent Labs Lab 08/28/14 1133 08/28/14 1658 08/28/14 2206 08/29/14 0742 08/29/14 1205  GLUCAP 110* 105* 98 94 117*   Signed:  Nile Prisk  [facility] 08/29/2014, 3:38 PM

## 2014-08-29 NOTE — Discharge Instructions (Signed)
Follow with [name] [name], Samuel, MD in 5-7 days  Please get a complete blood count and chemistry panel checked by your Primary MD at your next visit, and again as instructed by your Primary MD. Please get your medications reviewed and adjusted by your Primary MD.  Please request your Primary MD to go over all Hospital Tests and Procedure/Radiological results at the follow up, please get all Hospital records sent to your Prim MD by signing hospital release before you go home.  If you had Pneumonia of Lung problems at the Hospital: Please get a 2 view Chest X ray done in 6-8 weeks after hospital discharge or sooner if instructed by your Primary MD.  If you have Congestive Heart Failure: Please call your Cardiologist or Primary MD anytime you have any of the following symptoms:  1) 3 pound weight gain in 24 hours or 5 pounds in 1 week  2) shortness of breath, with or without a dry hacking cough  3) swelling in the hands, feet or stomach  4) if you have to sleep on extra pillows at night in order to breathe  Follow cardiac low salt diet and 1.5 lit/day fluid restriction.  If you have diabetes Accuchecks 4 times/day, Once in AM empty stomach and then before each meal. Log in all results and show them to your primary doctor at your next visit. If any glucose reading is under 80 or above 300 call your primary MD immediately.  If you have Seizure/Convulsions/Epilepsy: Please do not drive, operate heavy machinery, participate in activities at heights or participate in high speed sports until you have seen by Primary MD or a Neurologist and advised to do so again.  If you had Gastrointestinal Bleeding: Please ask your Primary MD to check a complete blood count within one week of discharge or at your next visit. Your endoscopic/colonoscopic biopsies that are pending at the time of discharge, will also need to followed by your Primary MD.  Get Medicines reviewed and adjusted. Please take all your  medications with you for your next visit with your Primary MD  Please request your Primary MD to go over all hospital tests and procedure/radiological results at the follow up, please ask your Primary MD to get all Hospital records sent to his/her office.  If you experience worsening of your admission symptoms, develop shortness of breath, life threatening emergency, suicidal or homicidal thoughts you must seek medical attention immediately by calling 911 or calling your MD immediately  if symptoms less severe.  You must read complete instructions/literature along with all the possible adverse reactions/side effects for all the Medicines you take and that have been prescribed to you. Take any new Medicines after you have completely understood and accpet all the possible adverse reactions/side effects.   Do not drive or operate heavy machinery when taking Pain medications.   Do not take more than prescribed Pain, Sleep and Anxiety Medications  Special Instructions: If you have smoked or chewed Tobacco  in the last 2 yrs please stop smoking, stop any regular Alcohol  and or any Recreational drug use.  Wear Seat belts while driving.  Please note You were cared for by a hospitalist during your hospital stay. If you have any questions about your discharge medications or the care you received while you were in the hospital after you are discharged, you can call the unit and asked to speak with the hospitalist on call if the hospitalist that took care of you is not available. Once  you are discharged, your primary care physician will handle any further medical issues. Please note that NO REFILLS for any discharge medications will be authorized once you are discharged, as it is imperative that you return to your primary care physician (or establish a relationship with a primary care physician if you do not have one) for your aftercare needs so that they can reassess your need for medications and monitor your  lab values.  You can reach the hospitalist office at phone [phone] or fax [phone]   If you do not have a primary care physician, you can call [phone] for a physician referral.  Activity: As tolerated with Full fall precautions use walker/cane & assistance as needed  Diet: regular  Disposition Home

## 2014-09-01 LAB — CULTURE, BLOOD (ROUTINE X 2)
CULTURE: NO GROWTH
Culture: NO GROWTH

## 2015-01-16 ENCOUNTER — Emergency Department (HOSPITAL_COMMUNITY): Payer: Medicare Other

## 2015-01-16 ENCOUNTER — Inpatient Hospital Stay (HOSPITAL_COMMUNITY)
Admission: EM | Admit: 2015-01-16 | Discharge: 2015-01-19 | DRG: 871 | Disposition: A | Discharge status: Home / Self Care | Payer: Medicare Other | Attending: Internal Medicine | Admitting: Internal Medicine

## 2015-01-16 ENCOUNTER — Encounter (HOSPITAL_COMMUNITY): Payer: Self-pay | Admitting: Emergency Medicine

## 2015-01-16 DIAGNOSIS — R109 Unspecified abdominal pain: Secondary | ICD-10-CM

## 2015-01-16 DIAGNOSIS — K219 Gastro-esophageal reflux disease without esophagitis: Secondary | ICD-10-CM | POA: Diagnosis present

## 2015-01-16 DIAGNOSIS — A4151 Sepsis due to Escherichia coli [E. coli]: Secondary | ICD-10-CM | POA: Diagnosis not present

## 2015-01-16 DIAGNOSIS — Z7401 Bed confinement status: Secondary | ICD-10-CM | POA: Diagnosis not present

## 2015-01-16 DIAGNOSIS — E669 Obesity, unspecified: Secondary | ICD-10-CM | POA: Diagnosis present

## 2015-01-16 DIAGNOSIS — G8220 Paraplegia, unspecified: Secondary | ICD-10-CM | POA: Diagnosis present

## 2015-01-16 DIAGNOSIS — Z90710 Acquired absence of both cervix and uterus: Secondary | ICD-10-CM | POA: Diagnosis not present

## 2015-01-16 DIAGNOSIS — E1140 Type 2 diabetes mellitus with diabetic neuropathy, unspecified: Secondary | ICD-10-CM | POA: Diagnosis present

## 2015-01-16 DIAGNOSIS — G9341 Metabolic encephalopathy: Secondary | ICD-10-CM | POA: Diagnosis present

## 2015-01-16 DIAGNOSIS — I10 Essential (primary) hypertension: Secondary | ICD-10-CM | POA: Diagnosis present

## 2015-01-16 DIAGNOSIS — E876 Hypokalemia: Secondary | ICD-10-CM | POA: Diagnosis not present

## 2015-01-16 DIAGNOSIS — E785 Hyperlipidemia, unspecified: Secondary | ICD-10-CM | POA: Diagnosis present

## 2015-01-16 DIAGNOSIS — A419 Sepsis, unspecified organism: Secondary | ICD-10-CM | POA: Diagnosis not present

## 2015-01-16 DIAGNOSIS — R21 Rash and other nonspecific skin eruption: Secondary | ICD-10-CM | POA: Diagnosis present

## 2015-01-16 DIAGNOSIS — R5383 Other fatigue: Secondary | ICD-10-CM | POA: Diagnosis not present

## 2015-01-16 DIAGNOSIS — N390 Urinary tract infection, site not specified: Secondary | ICD-10-CM | POA: Diagnosis present

## 2015-01-16 DIAGNOSIS — Z888 Allergy status to other drugs, medicaments and biological substances status: Secondary | ICD-10-CM | POA: Diagnosis not present

## 2015-01-16 DIAGNOSIS — R4182 Altered mental status, unspecified: Secondary | ICD-10-CM | POA: Diagnosis present

## 2015-01-16 DIAGNOSIS — B9620 Unspecified Escherichia coli [E. coli] as the cause of diseases classified elsewhere: Secondary | ICD-10-CM | POA: Diagnosis present

## 2015-01-16 DIAGNOSIS — Z6833 Body mass index (BMI) 33.0-33.9, adult: Secondary | ICD-10-CM

## 2015-01-16 DIAGNOSIS — Z87891 Personal history of nicotine dependence: Secondary | ICD-10-CM

## 2015-01-16 DIAGNOSIS — R531 Weakness: Secondary | ICD-10-CM

## 2015-01-16 DIAGNOSIS — R404 Transient alteration of awareness: Secondary | ICD-10-CM | POA: Diagnosis not present

## 2015-01-16 DIAGNOSIS — G894 Chronic pain syndrome: Secondary | ICD-10-CM | POA: Diagnosis present

## 2015-01-16 DIAGNOSIS — I5032 Chronic diastolic (congestive) heart failure: Secondary | ICD-10-CM | POA: Diagnosis present

## 2015-01-16 LAB — COMPREHENSIVE METABOLIC PANEL
ALBUMIN: 3.9 g/dL (ref 3.5–5.0)
ALT: 29 U/L (ref 14–54)
AST: 27 U/L (ref 15–41)
Alkaline Phosphatase: 61 U/L (ref 38–126)
Anion gap: 14 (ref 5–15)
BUN: 24 mg/dL — AB (ref 6–20)
CHLORIDE: 97 mmol/L — AB (ref 101–111)
CO2: 30 mmol/L (ref 22–32)
Calcium: 9.6 mg/dL (ref 8.9–10.3)
Creatinine, Ser: 0.77 mg/dL (ref 0.44–1.00)
GFR calc non Af Amer: >60 mL/min (ref >60)
Glucose, Bld: 137 mg/dL — ABNORMAL HIGH (ref 65–99)
POTASSIUM: 3.5 mmol/L (ref 3.5–5.1)
Sodium: 141 mmol/L (ref 135–145)
Total Bilirubin: 1 mg/dL (ref 0.3–1.2)
Total Protein: 6.9 g/dL (ref 6.5–8.1)

## 2015-01-16 LAB — URINALYSIS, ROUTINE W REFLEX MICROSCOPIC
Bilirubin Urine: NEGATIVE
GLUCOSE, UA: NEGATIVE mg/dL
Ketones, ur: NEGATIVE mg/dL
Nitrite: NEGATIVE
Protein, ur: NEGATIVE mg/dL
SPECIFIC GRAVITY, URINE: 1.022 (ref 1.005–1.030)
UROBILINOGEN UA: 1 mg/dL (ref 0.0–1.0)
pH: 6.5 (ref 5.0–8.0)

## 2015-01-16 LAB — CBC WITH DIFFERENTIAL/PLATELET
Basophils Absolute: 0.1 10*3/uL (ref 0.0–0.1)
Basophils Relative: 1 % (ref 0–1)
EOS PCT: 2 % (ref 0–5)
Eosinophils Absolute: 0.4 10*3/uL (ref 0.0–0.7)
HCT: 47 % — ABNORMAL HIGH (ref 36.0–46.0)
Hemoglobin: 14.8 g/dL (ref 12.0–15.0)
Lymphocytes Relative: 25 % (ref 12–46)
Lymphs Abs: 4.5 10*3/uL — ABNORMAL HIGH (ref 0.7–4.0)
MCH: 27.7 pg (ref 26.0–34.0)
MCHC: 31.5 g/dL (ref 30.0–36.0)
MCV: 88 fL (ref 78.0–100.0)
Monocytes Absolute: 0.9 10*3/uL (ref 0.1–1.0)
Monocytes Relative: 5 % (ref 3–12)
NEUTROS PCT: 67 % (ref 43–77)
Neutro Abs: 11.9 10*3/uL — ABNORMAL HIGH (ref 1.7–7.7)
PLATELETS: 219 10*3/uL (ref 150–400)
RBC: 5.34 MIL/uL — ABNORMAL HIGH (ref 3.87–5.11)
RDW: 15 % (ref 11.5–15.5)
WBC: 17.7 10*3/uL — ABNORMAL HIGH (ref 4.0–10.5)

## 2015-01-16 LAB — MRSA PCR SCREENING: MRSA BY PCR: INVALID — AB

## 2015-01-16 LAB — URINE MICROSCOPIC-ADD ON

## 2015-01-16 LAB — LACTIC ACID, PLASMA
Lactic Acid, Venous: 3.6 mmol/L (ref 0.5–2.0)
Lactic Acid, Venous: 3.5 mmol/L (ref 0.5–2.0)

## 2015-01-16 LAB — LIPASE, BLOOD: LIPASE: 14 U/L — AB (ref 22–51)

## 2015-01-16 MED ORDER — CEFTRIAXONE SODIUM IN DEXTROSE 20 MG/ML IV SOLN: 1.0000 g | INTRAVENOUS | Status: DC

## 2015-01-16 MED ORDER — SODIUM CHLORIDE 0.9 % IV BOLUS (SEPSIS)
1000.0000 mL | Freq: Once | INTRAVENOUS | Status: AC
  Administered 2015-01-16: 1000 mL via INTRAVENOUS

## 2015-01-16 MED ORDER — LACTATED RINGERS IV BOLUS (SEPSIS)
1000.0000 mL | Freq: Once | INTRAVENOUS | Status: AC
Start: 2015-01-16 — End: 2015-01-16
  Administered 2015-01-16: 1000 mL via INTRAVENOUS

## 2015-01-16 MED ORDER — OXYCODONE HCL 5 MG PO TABS
5.0000 mg | ORAL_TABLET | ORAL | Status: DC | PRN
  Administered 2015-01-16: 5 mg via ORAL
  Filled 2015-01-16: qty 1

## 2015-01-16 MED ORDER — DEXTROSE 5 % IV SOLN
1.0000 g | Freq: Once | INTRAVENOUS | Status: AC
  Administered 2015-01-16: 1 g via INTRAVENOUS
  Filled 2015-01-16: qty 10

## 2015-01-16 NOTE — ED Notes (Signed)
Awake. Verbally responsive. A/O x4. Resp even and unlabored. No audible adventitious breath sounds noted. ABC's intact. IV infusing NS without difficulty. No adverse reaction to IV ABT.

## 2015-01-16 NOTE — ED Notes (Signed)
Awake. Verbally responsive. A/O x4. Resp even and unlabored. No audible adventitious breath sounds noted. ABC's intact. IV infusing LR at 53200ml/hr without difficulty.

## 2015-01-16 NOTE — ED Provider Notes (Addendum)
CSN: [id number]     Arrival date & time 01/16/15  0919 History   First MD Initiated Contact with Patient 01/16/15 [id number]     Chief Complaint  Patient presents with  . Urinary Frequency     (Consider location/radiation/quality/duration/timing/severity/associated sxs/prior Treatment) Patient is a 69 y.o. female presenting with frequency.  Urinary Frequency This is a new problem. The current episode started yesterday. The problem occurs constantly. Associated symptoms include abdominal pain (initially denies, then reports right sided). Pertinent negatives include no chest pain, no headaches and no shortness of breath. Nothing aggravates the symptoms. Nothing relieves the symptoms. She has tried nothing for the symptoms. The treatment provided no relief.    Past Medical History  Diagnosis Date  . GERD (gastroesophageal reflux disease)   . Quadriplegia   . Hypertension   . Pressure ulcer of foot   . Chronic pain disorder   . Hyperlipidemia    Past Surgical History  Procedure Laterality Date  . Back surgery  08/2013  . Abdominal hysterectomy     Family History  Problem Relation Age of Onset  . Adopted: Yes   History  Substance Use Topics  . Smoking status: Former Smoker -- 0.25 packs/day    Types: Cigarettes  . Smokeless tobacco: Never Used  . Alcohol Use: No   OB History    No data available     Review of Systems  Constitutional: Positive for appetite change. Negative for fever and chills.  HENT: Negative for sore throat.   Eyes: Negative for visual disturbance.  Respiratory: Negative for cough and shortness of breath.   Cardiovascular: Negative for chest pain.  Gastrointestinal: Positive for abdominal pain (initially denies, then reports right sided). Negative for nausea, vomiting, diarrhea and constipation.  Genitourinary: Positive for frequency. Negative for dysuria, flank pain and difficulty urinating.  Musculoskeletal: Negative for back pain and neck pain.  Skin:  Negative for rash.  Neurological: Negative for syncope and headaches.      Allergies  Decadrol; Decadron; and Sodium phosphate  Home Medications   Prior to Admission medications   Medication Sig Start Date End Date Taking? Authorizing Provider  aspirin EC 81 MG tablet Take 81 mg by mouth every morning.     Historical Provider, MD  baclofen (LIORESAL) 10 MG tablet Take 10 mg by mouth 3 (three) times daily.    Historical Provider, MD  Choline Fenofibrate (TRILIPIX) 135 MG capsule Take 135 mg by mouth every morning.     Historical Provider, MD  ezetimibe (ZETIA) 10 MG tablet Take 10 mg by mouth every morning.     Historical Provider, MD  feeding supplement, ENSURE, (ENSURE) PUDG Take 1 Container by mouth 2 (two) times daily between meals. 03/17/14   Nishant Dhungel, MD  gabapentin (NEURONTIN) 300 MG capsule Take 300 mg by mouth 3 (three) times daily.    Historical Provider, MD  LORazepam (ATIVAN) 1 MG tablet Take 0.5 tablets (0.5 mg total) by mouth every 12 (twelve) hours as needed for anxiety. 04/18/14   Meredeth Ide, MD  magnesium oxide (MAG-OX) 400 MG tablet Take 400 mg by mouth 2 (two) times daily.    Historical Provider, MD  metFORMIN (GLUCOPHAGE) 500 MG tablet Take 500 mg by mouth daily with breakfast.     Historical Provider, MD  Multiple Vitamin (MULTIVITAMIN WITH MINERALS) TABS tablet Take 1 tablet by mouth every morning.     Historical Provider, MD  omeprazole (PRILOSEC) 20 MG capsule Take 20 mg by mouth every morning.  Historical Provider, MD  OxyCODONE (OXYCONTIN) 20 mg T12A 12 hr tablet Take 1 tablet (20 mg total) by mouth 2 (two) times daily. 04/18/14   Meredeth IdeGagan S Lama, MD  polyethylene glycol (MIRALAX / GLYCOLAX) packet Take 17 g by mouth daily as needed for mild constipation.    Historical Provider, MD  potassium chloride SA (K-DUR,KLOR-CON) 20 MEQ tablet Take 1 tablet (20 mEq total) by mouth 2 (two) times daily. 04/18/14   Meredeth IdeGagan S Lama, MD  promethazine (PHENERGAN) 25 MG  tablet Take 25 mg by mouth every 6 (six) hours as needed for nausea or vomiting.    Historical Provider, MD  spironolactone (ALDACTONE) 25 MG tablet Take 1 tablet (25 mg total) by mouth daily. Patient not taking: Reported on 08/25/2014 04/18/14   Meredeth IdeGagan S Lama, MD  sulfamethoxazole-trimethoprim (BACTRIM DS,SEPTRA DS) 800-160 MG per tablet Take 1 tablet by mouth every 12 (twelve) hours. 08/29/14   Costin Otelia SergeantM Gherghe, MD  tamsulosin (FLOMAX) 0.4 MG CAPS capsule Take 0.4 mg by mouth every morning.     Historical Provider, MD  torsemide (DEMADEX) 20 MG tablet Take 20 mg by mouth every morning.     Historical Provider, MD  Vortioxetine HBr (BRINTELLIX) 10 MG TABS Take 10 mg by mouth daily.     Historical Provider, MD  zolpidem (AMBIEN) 5 MG tablet Take 5 mg by mouth at bedtime as needed for sleep.    Historical Provider, MD   BP 161/85 mmHg  Pulse 111  Temp(Src) 98.4 F (36.9 C) (Oral)  Resp 20  SpO2 95% Physical Exam  Constitutional: She is oriented to person, place, and time. She appears well-developed and well-nourished. No distress.  HENT:  Head: Normocephalic and atraumatic.  Eyes: Conjunctivae and EOM are normal.  Neck: Normal range of motion.  Cardiovascular: Normal rate, regular rhythm, normal heart sounds and intact distal pulses.  Exam reveals no gallop and no friction rub.   No murmur heard. Pulmonary/Chest: Effort normal and breath sounds normal. No respiratory distress. She has no wheezes. She has no rales.  Abdominal: Soft. She exhibits no distension. There is tenderness. There is positive Murphy's sign. There is no guarding and no tenderness at McBurney's point.  Musculoskeletal: She exhibits no edema or tenderness.  Neurological: She is alert and oriented to person, place, and time.  5/5 shoulder abduction 4/5 distal arm flexion/extension (chronic per pt) 0/5 lower ext strength (chronic/paraplegic)  Skin: Skin is warm and dry. No rash noted. She is not diaphoretic. No erythema.   Nursing note and vitals reviewed.   ED Course  Procedures (including critical care time) Labs Review Labs Reviewed  URINALYSIS, ROUTINE W REFLEX MICROSCOPIC (NOT AT [facility])  COMPREHENSIVE METABOLIC PANEL  CBC WITH DIFFERENTIAL/PLATELET  LACTIC ACID, PLASMA  LACTIC ACID, PLASMA    Imaging Review No results found.   EKG Interpretation None      MDM   Final diagnoses:  None   69 year old female presents with concern of tremors and urinary frequency.  On arrival to the emergency department patient is afebrile however tachycardic.  Labs showed a leukocytosis of 17.7 with a lactic acid of 3.6 and given concern for tachycardia, leukocytosis and lactic acid concern for sepsis.  Patient with a positive Murphy sign on exam a right upper quadrant tenderness and a right upper quadrant ultrasound was ordered which showed no signs of cholecystitis. Chest x-ray done given mild cough with leukocytosis and showed no signs of pneumonia. Urinalysis returned which was concerning for a urinary tract  infection. Blood cx drawn, 2L IVF given. Given presence of urinary tract infection with sepsis patient was given Rocephin and admitted to the hospitalist service for further care.    Alvira Monday, MD 01/16/15 [id number]  Alvira Monday, MD 01/16/15 1191

## 2015-01-16 NOTE — ED Notes (Signed)
Attempted IV venipuncture x2 nurses x2 unsuccessful stricks.

## 2015-01-16 NOTE — ED Notes (Signed)
US at bedside

## 2015-01-16 NOTE — ED Notes (Addendum)
Awake. Verbally responsive. A/O x4. Resp even and unlabored. No audible adventitious breath sounds noted. ABC's intact. IV patent and intact. Family at bedside. Pt repositioned in bed.

## 2015-01-16 NOTE — ED Notes (Signed)
Son's number: [phone] [name]([name]). Contact him if she gets moved up to the floor, he has stepped out for a minute.

## 2015-01-16 NOTE — ED Notes (Signed)
Patient unable to void at this time

## 2015-01-16 NOTE — H&P (Addendum)
[facility] History and Physical  [name] MeekerLinda Goddard ZOX:[id number] DOB: [birth year] DOA: 01/16/2015  Referring physician: ED physician, Dr Dalene SeltzerSchlossman PCP: [name], Samuel, MD   Chief Complaint: lethargy   HPI:  Patient is 69 year old female, paraplegic, [facility] for evaluation of several days duration off progressively lethargy. Please note that patient is unable to provide detailed history due to confusion and lethargy, no family present at the time of the admission. Most of the records obtained from emergency room doctor. Patient Presented to ED via EMS. There is a report of hematuria, urinary urgency and frequency, dysuria per son who was available at bedside earlier upon arrival to ED. No report of chest pain or shortness of breath, no other abdominal concerns.  In emergency department, patient noted to be tachycardic with heart rate up to 118, T 99.5, blood work notable for WBC 17 K, lactic acid higher than 3. Urinalysis suggestive of UTI. [facility] asked to admit patient to telemetry bed for evaluation and management of presumptive sepsis secondary to UTI.   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  Paraplegia  - Bed bound  DVT prophylaxis  - Lovenox SQ   Radiological Exams on Admission: Dg Chest 1 View  01/16/2015   CLINICAL DATA:  Cough and low-grade fever  EXAM: CHEST  1 VIEW  COMPARISON:  August 25, 2014  FINDINGS: Lungs are clear. Heart size and pulmonary vascularity are normal. No adenopathy. No bone lesions. There is postoperative change in the lower cervical spine.  IMPRESSION: No edema or consolidation.   Electronically Signed   By: Bretta BangWilliam  Woodruff III M.D.   On: 01/16/2015 11:29   [place] Abdomen Limited Ruq  01/16/2015   CLINICAL DATA:  69 year old female with right-sided pain. Subsequent encounter.  EXAM: US ABDOMEN LIMITED - RIGHT UPPER QUADRANT  COMPARISON:  03/16/2014 ultrasound.  FINDINGS: Gallbladder:  Gallbladder is full. No gallstones or wall thickening visualized. No sonographic Murphy sign noted.  Common bile duct:  Diameter: 2.3 mm proximally. Mid to distal aspect not visualized secondary to bowel gas.  Liver:  Increased echogenicity suggestive of fatty infiltration with poor penetration without obvious mass.  IMPRESSION: Gallbladder is full. No gallstones or wall thickening visualized. No sonographic Murphy sign noted.  Increased echogenicity of the liver suggestive of fatty infiltration with poor penetration without obvious mass.   Electronically Signed   By: Lacy DuverneySteven  Olson M.D.   On: 01/16/2015 12:45     Code Status: Full Family Communication: At bedside Disposition Plan: Admit for further evaluation    Danie BinderIskra Decarlos Empey  [facility] [phone]   Review of Systems:  Unable to obtain due to altered mental status    Past Medical History  Diagnosis Date  . GERD (gastroesophageal reflux disease)   . Quadriplegia   . Hypertension   . Pressure ulcer of foot   . Chronic pain disorder   . Hyperlipidemia     Past Surgical History  Procedure  Laterality Date  . Back surgery  08/2013  . Abdominal hysterectomy      Social History:  reports that she has quit smoking. Her smoking use included Cigarettes. She smoked 0.25 packs per day. She has never used smokeless tobacco. She reports that she does not drink alcohol or use illicit drugs.  Allergies  Allergen Reactions  . Decadrol [Dexamethasone] Swelling    Swelling of face and stomach   . Decadron [Dexamethasone] Swelling  . Sodium Phosphate Swelling    Swelling of face and stomach     Family History  Problem Relation Age of Onset  . Adopted: Yes    Medication Sig  aspirin EC 81 MG tablet Take 81 mg by mouth every morning.   baclofen (LIORESAL) 10 MG tablet Take 10 mg by mouth 3 (three) times daily.  ezetimibe (ZETIA) 10 MG tablet Take 10 mg by mouth every morning.   folic acid (FOLVITE) 1 MG tablet Take 1 mg by mouth daily.  gabapentin (NEURONTIN) 300 MG capsule Take 300 mg by mouth 3 (three) times daily.  LORazepam (ATIVAN) 1 MG tablet Take 0.5 tablets (0.5 mg total) by mouth every 12 (twelve) hours as needed for anxiety. Patient taking differently: Take 1 mg by mouth 3 (three) times daily.   magnesium oxide (MAG-OX) 400 MG tablet Take 400 mg by mouth 2 (two) times daily.  methotrexate (RHEUMATREX) 2.5 MG tablet Take 7.5 mg by mouth every Tuesday. Caution:Chemotherapy. Protect from light.  omeprazole (PRILOSEC) 20 MG capsule Take 20 mg by mouth daily as needed (for acid reflex).   OxyCODONE (OXYCONTIN) 20 mg T12A 12 hr tablet Take 1 tablet (20 mg total) by mouth 2 (two) times daily. Patient taking differently: Take 40 mg by mouth 2 (two) times daily.   potassium chloride SA (K-DUR,KLOR-CON) 20 MEQ tablet Take 1 tablet (20 mEq total) by mouth 2 (two) times daily. Patient taking differently: Take 20 mEq by mouth 3 (three) times daily.   predniSONE (DELTASONE) 5 MG tablet Take 15 mg by mouth daily with breakfast.  rOPINIRole (REQUIP) 0.5 MG tablet Take 0.5 mg by mouth 2  (two) times daily.  tamsulosin (FLOMAX) 0.4 MG CAPS capsule Take 0.4 mg by mouth at bedtime.   temazepam (RESTORIL) 15 MG capsule Take 15 mg by mouth at bedtime.  torsemide (DEMADEX) 20 MG tablet Take 20 mg by mouth 2 (two) times daily.   spironolactone (ALDACTONE) 25 MG tablet Take 1 tablet (25 mg total) by mouth daily. Patient not taking: Reported on 08/25/2014    Physical Exam: Filed Vitals:   01/16/15 1134 01/16/15 1139 01/16/15 1152 01/16/15 1200  BP:  168/82 152/79 145/77  Pulse:   115 118  Temp: 99.5 F (37.5 C) 99.5 F (37.5 C)    TempSrc: Rectal Rectal    Resp:  20 16   SpO2:  96% 94% 95%    Physical Exam  Constitutional: Appears calm, not in acute distress, confused  HENT: Normocephalic. External right and left ear normal.Dry mucous membranes  Eyes: Conjunctivae and EOM are normal. PERRLA, no scleral icterus.  Neck: Normal ROM. Neck supple. No  JVD. No tracheal deviation. No thyromegaly.  CVS: Regular rhythm, tachycardic, no gallops, no carotid bruit.  Pulmonary: Effort and breath sounds normal, no stridor, Diminished breath sounds at bases  Abdominal: Soft. BS +,  no distension, tenderness, rebound or guarding.  Musculoskeletal: Patient paraplegic  Lymphadenopathy: No lymphadenopathy noted, cervical, inguinal. Neuro: Patient quadriplegic, confused, able to follow simple commands  Skin: Skin is warm and dry. No rash noted. Not diaphoretic. No erythema. No pallor.  Psychiatric: Difficult to assess due to confusion  Labs on Admission:  Basic Metabolic Panel:  Recent Labs Lab 01/16/15 0949  NA 141  K 3.5  CL 97*  CO2 30  GLUCOSE 137*  BUN 24*  CREATININE 0.77  CALCIUM 9.6   Liver Function Tests:  Recent Labs Lab 01/16/15 0949  AST 27  ALT 29  ALKPHOS 61  BILITOT 1.0  PROT 6.9  ALBUMIN 3.9    Recent Labs Lab 01/16/15 0949  LIPASE 14*   CBC:  Recent Labs Lab 01/16/15 0949  WBC 17.7*  NEUTROABS 11.9*  HGB 14.8  HCT 47.0*  MCV 88.0  PLT  219   EKG: pending    If 7PM-7AM, please contact night-coverage [email or web address] Password [facility] 01/16/2015, 1:27 PM

## 2015-01-16 NOTE — ED Notes (Signed)
Pt arrived via EMS with report of pt having UTI symptoms. Pt reported shaking, denies abd/back pain, frequency/urgency, burning/pressue with void and hematuria but reported foul odor urine.

## 2015-01-16 NOTE — ED Notes (Signed)
Bed: WA17 Expected date:  Expected time:  Means of arrival:  Comments: EMS-UTI 

## 2015-01-17 DIAGNOSIS — N390 Urinary tract infection, site not specified: Secondary | ICD-10-CM

## 2015-01-17 DIAGNOSIS — R531 Weakness: Secondary | ICD-10-CM

## 2015-01-17 DIAGNOSIS — R404 Transient alteration of awareness: Secondary | ICD-10-CM

## 2015-01-17 LAB — BASIC METABOLIC PANEL
Anion gap: 11 (ref 5–15)
BUN: 11 mg/dL (ref 6–20)
CO2: 23 mmol/L (ref 22–32)
Calcium: 9 mg/dL (ref 8.9–10.3)
Chloride: 107 mmol/L (ref 101–111)
Creatinine, Ser: 0.5 mg/dL (ref 0.44–1.00)
GFR calc Af Amer: >60 mL/min (ref >60)
GLUCOSE: 168 mg/dL — AB (ref 65–99)
POTASSIUM: 3.6 mmol/L (ref 3.5–5.1)
Sodium: 141 mmol/L (ref 135–145)

## 2015-01-17 LAB — CBC
HCT: 43.1 % (ref 36.0–46.0)
HEMOGLOBIN: 14 g/dL (ref 12.0–15.0)
MCH: 27.7 pg (ref 26.0–34.0)
MCHC: 32.5 g/dL (ref 30.0–36.0)
MCV: 85.2 fL (ref 78.0–100.0)
Platelets: 248 10*3/uL (ref 150–400)
RBC: 5.06 MIL/uL (ref 3.87–5.11)
RDW: 15 % (ref 11.5–15.5)
WBC: 13.9 10*3/uL — ABNORMAL HIGH (ref 4.0–10.5)

## 2015-01-17 LAB — GLUCOSE, CAPILLARY
Glucose-Capillary: 241 mg/dL — ABNORMAL HIGH (ref 65–99)
Glucose-Capillary: 259 mg/dL — ABNORMAL HIGH (ref 65–99)

## 2015-01-17 LAB — MAGNESIUM: MAGNESIUM: 1.8 mg/dL (ref 1.7–2.4)

## 2015-01-17 LAB — TSH: TSH: 2.571 u[IU]/mL (ref 0.350–4.500)

## 2015-01-17 LAB — PHOSPHORUS: PHOSPHORUS: 3.4 mg/dL (ref 2.5–4.6)

## 2015-01-17 LAB — CLOSTRIDIUM DIFFICILE BY PCR: Toxigenic C. Difficile by PCR: NEGATIVE

## 2015-01-17 MED ORDER — PREDNISONE 5 MG PO TABS
15.0000 mg | ORAL_TABLET | Freq: Every day | ORAL | Status: DC
  Administered 2015-01-17 – 2015-01-19 (×3): 15 mg via ORAL
  Filled 2015-01-17 (×3): qty 1

## 2015-01-17 MED ORDER — PANTOPRAZOLE SODIUM 40 MG PO TBEC
40.0000 mg | DELAYED_RELEASE_TABLET | Freq: Every day | ORAL | Status: DC
  Administered 2015-01-17 – 2015-01-19 (×3): 40 mg via ORAL
  Filled 2015-01-17 (×3): qty 1

## 2015-01-17 MED ORDER — MAGNESIUM OXIDE 400 (241.3 MG) MG PO TABS
400.0000 mg | ORAL_TABLET | Freq: Two times a day (BID) | ORAL | Status: DC
  Administered 2015-01-17 – 2015-01-19 (×5): 400 mg via ORAL
  Filled 2015-01-17 (×5): qty 1

## 2015-01-17 MED ORDER — FOLIC ACID 1 MG PO TABS
1.0000 mg | ORAL_TABLET | Freq: Every day | ORAL | Status: DC
Start: 2015-01-17 — End: 2015-01-19
  Administered 2015-01-17 – 2015-01-19 (×3): 1 mg via ORAL
  Filled 2015-01-17 (×3): qty 1

## 2015-01-17 MED ORDER — OXYCODONE HCL ER 20 MG PO T12A
20.0000 mg | EXTENDED_RELEASE_TABLET | Freq: Two times a day (BID) | ORAL | Status: DC
  Administered 2015-01-17 – 2015-01-19 (×5): 20 mg via ORAL
  Filled 2015-01-17 (×5): qty 1

## 2015-01-17 MED ORDER — ENOXAPARIN SODIUM 40 MG/0.4ML ~~LOC~~ SOLN: 40.0000 mg | SUBCUTANEOUS | Status: DC

## 2015-01-17 MED ORDER — TRIFLUOPERAZINE HCL 5 MG PO TABS
5.0000 mg | ORAL_TABLET | Freq: Two times a day (BID) | ORAL | Status: DC | PRN
Start: 2015-01-17 — End: 2015-01-19
  Administered 2015-01-19: 5 mg via ORAL
  Filled 2015-01-17 (×2): qty 1

## 2015-01-17 MED ORDER — CEFTRIAXONE SODIUM IN DEXTROSE 20 MG/ML IV SOLN
1.0000 g | INTRAVENOUS | Status: DC
  Administered 2015-01-17: 1 g via INTRAVENOUS
  Filled 2015-01-17 (×2): qty 50

## 2015-01-17 MED ORDER — ASPIRIN EC 81 MG PO TBEC
81.0000 mg | DELAYED_RELEASE_TABLET | Freq: Every morning | ORAL | Status: DC
  Administered 2015-01-17 – 2015-01-19 (×3): 81 mg via ORAL
  Filled 2015-01-17 (×3): qty 1

## 2015-01-17 MED ORDER — BACLOFEN 10 MG PO TABS
10.0000 mg | ORAL_TABLET | Freq: Three times a day (TID) | ORAL | Status: DC
  Administered 2015-01-17 – 2015-01-19 (×7): 10 mg via ORAL
  Filled 2015-01-17 (×8): qty 1

## 2015-01-17 MED ORDER — TAMSULOSIN HCL 0.4 MG PO CAPS
0.4000 mg | ORAL_CAPSULE | Freq: Every day | ORAL | Status: DC
  Administered 2015-01-17 – 2015-01-18 (×2): 0.4 mg via ORAL
  Filled 2015-01-17 (×2): qty 1

## 2015-01-17 MED ORDER — ZOLPIDEM TARTRATE 5 MG PO TABS: 5.0000 mg | ORAL_TABLET | Freq: Every evening | ORAL | Status: DC | PRN

## 2015-01-17 MED ORDER — LORAZEPAM 0.5 MG PO TABS
0.5000 mg | ORAL_TABLET | Freq: Two times a day (BID) | ORAL | Status: DC | PRN
  Administered 2015-01-17: 0.5 mg via ORAL
  Filled 2015-01-17: qty 1

## 2015-01-17 MED ORDER — ENOXAPARIN SODIUM 40 MG/0.4ML ~~LOC~~ SOLN
40.0000 mg | SUBCUTANEOUS | Status: DC
  Administered 2015-01-17 – 2015-01-19 (×3): 40 mg via SUBCUTANEOUS
  Filled 2015-01-17 (×3): qty 0.4

## 2015-01-17 MED ORDER — ROPINIROLE HCL 0.5 MG PO TABS
0.5000 mg | ORAL_TABLET | Freq: Two times a day (BID) | ORAL | Status: DC
  Administered 2015-01-17 – 2015-01-19 (×5): 0.5 mg via ORAL
  Filled 2015-01-17 (×5): qty 1

## 2015-01-17 MED ORDER — TORSEMIDE 20 MG PO TABS
20.0000 mg | ORAL_TABLET | Freq: Two times a day (BID) | ORAL | Status: DC
  Administered 2015-01-17 – 2015-01-19 (×5): 20 mg via ORAL
  Filled 2015-01-17 (×6): qty 1

## 2015-01-17 MED ORDER — IBUPROFEN-DIPHENHYDRAMINE CIT 200-38 MG PO TABS: 1.0000 | ORAL_TABLET | Freq: Every evening | ORAL | Status: DC | PRN

## 2015-01-17 MED ORDER — EZETIMIBE 10 MG PO TABS
10.0000 mg | ORAL_TABLET | Freq: Every morning | ORAL | Status: DC
  Administered 2015-01-17 – 2015-01-19 (×3): 10 mg via ORAL
  Filled 2015-01-17 (×3): qty 1

## 2015-01-17 MED ORDER — TEMAZEPAM 15 MG PO CAPS
15.0000 mg | ORAL_CAPSULE | Freq: Every day | ORAL | Status: DC
  Administered 2015-01-17 – 2015-01-18 (×2): 15 mg via ORAL
  Filled 2015-01-17 (×2): qty 1

## 2015-01-17 MED ORDER — GABAPENTIN 300 MG PO CAPS
300.0000 mg | ORAL_CAPSULE | Freq: Three times a day (TID) | ORAL | Status: DC
  Administered 2015-01-17 – 2015-01-19 (×7): 300 mg via ORAL
  Filled 2015-01-17 (×9): qty 1

## 2015-01-17 MED ORDER — INSULIN ASPART 100 UNIT/ML ~~LOC~~ SOLN
0.0000 [IU] | Freq: Three times a day (TID) | SUBCUTANEOUS | Status: DC
  Administered 2015-01-17: 3 [IU] via SUBCUTANEOUS
  Administered 2015-01-17: 5 [IU] via SUBCUTANEOUS
  Administered 2015-01-18: 2 [IU] via SUBCUTANEOUS
  Administered 2015-01-18: 3 [IU] via SUBCUTANEOUS
  Administered 2015-01-18 – 2015-01-19 (×2): 2 [IU] via SUBCUTANEOUS

## 2015-01-17 MED ORDER — IBUPROFEN 200 MG PO TABS: 200.0000 mg | ORAL_TABLET | Freq: Every evening | ORAL | Status: DC | PRN

## 2015-01-17 MED ORDER — METHOTREXATE 2.5 MG PO TABS: 7.5000 mg | ORAL_TABLET | ORAL | Status: DC

## 2015-01-17 MED ORDER — POLYETHYLENE GLYCOL 3350 17 G PO PACK: 17.0000 g | PACK | Freq: Every day | ORAL | Status: DC | PRN

## 2015-01-17 MED ORDER — CALCIUM CARBONATE-VITAMIN D 500-200 MG-UNIT PO TABS
1.0000 | ORAL_TABLET | Freq: Every day | ORAL | Status: DC
  Administered 2015-01-17 – 2015-01-19 (×3): 1 via ORAL
  Filled 2015-01-17 (×3): qty 1

## 2015-01-17 NOTE — Progress Notes (Addendum)
Patient ID: [name] [name], female   DOB: 02-25-1946, 69 y.o.   MRN: [id number]  [facility] PROGRESS NOTE  [name] [name] WUJ:[id number] DOB: January 02, 1946 DOA: 01/16/2015 PCP: [name], MD   Brief narrative:    Patient is 69 year old female, paraplegic, presented to [facility] emergency department for evaluation of several days duration off progressively lethargy. Please note that patient is unable to provide detailed history due to confusion and lethargy, no family present at the time of the admission. Most of the records obtained from emergency room doctor. Patient Presented to ED via EMS. There is a report of hematuria, urinary urgency and frequency, dysuria per son who was available at bedside earlier upon arrival to ED. No report of chest pain or shortness of breath, no other abdominal concerns.  In emergency department, patient noted to be tachycardic with heart rate up to 118, T 99.5, blood work notable for WBC 17 K, lactic acid higher than 3. Urinalysis suggestive of UTI. [facility] asked to admit patient to telemetry bed for evaluation and management of presumptive sepsis secondary to UTI.   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 Wynelle Cleveland - we have checked this with the office, records requested  - will continue Prednisone - methotrexate given Q Tuesday   Obesity  - Body mass index is 33.26 kg/(m^2).  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  Family Communication:  plan of care discussed with the patient Disposition Plan: Home when stable.   IV access:  Peripheral IV  Procedures and diagnostic studies:    Dg Chest 1 View 01/16/2015  No edema or consolidation.     US Abdomen Limited Ruq 01/16/2015  Gallbladder is full. No gallstones or wall thickening visualized. No sonographic Murphy sign noted.  Increased echogenicity of the liver suggestive of fatty infiltration with poor penetration without obvious mass.    Medical Consultants:  None  Other Consultants:  None  IAnti-Infectives:   Rocephin 7/12 -->  Debbora Presto, MD  [facility] Pager [phone]  If 7PM-7AM, please contact night-coverage [email or web address] Password [facility] 01/17/2015, 4:42 PM   LOS: 1 day   HPI/Subjective: No events overnight.   Objective: Filed Vitals:   01/16/15 1527 01/16/15 2040 01/17/15 0352 01/17/15 1345  BP:  160/105 149/72 152/96  Pulse:  116 109 105  Temp:  99.3 F (37.4 C) 100 F (37.8 C) 100.1 F (37.8 C)  TempSrc:  Oral Oral Oral  Resp:  20 16 18   Height: 5\' 2"  (1.575 m)     Weight: 82.5 kg (181 lb 14.1 oz)     SpO2:  97% 95% 95%    Intake/Output Summary (Last 24 hours) at 01/17/15 1642 Last data filed at 01/17/15 1530  Gross per 24 hour  Intake    480 ml  Output      0 ml  Net    480 ml    Exam:   General:  Pt is alert, follows commands  appropriately, not in acute distress  Cardiovascular: Regular rhythm, tachycardic, no rubs, no gallops  Respiratory: Clear to auscultation bilaterally, no wheezing, diminished breath sounds at bases   Abdomen: Soft, non tender, non distended, bowel sounds present, no guarding  Extremities: No edema, pulses DP and PT palpable bilaterally   Data Reviewed: Basic Metabolic Panel:  Recent Labs Lab 01/16/15 0949 01/17/15 0959  NA 141 141  K 3.5 3.6  CL 97* 107  CO2 30 23  GLUCOSE 137* 168*  BUN 24* 11  CREATININE 0.77 0.50  CALCIUM 9.6 9.0  MG  --  1.8  PHOS  --  3.4   Liver Function Tests:  Recent Labs Lab 01/16/15 0949  AST 27  ALT 29  ALKPHOS 61  BILITOT 1.0  PROT 6.9  ALBUMIN 3.9    Recent Labs Lab 01/16/15 0949  LIPASE 14*   CBC:  Recent Labs Lab 01/16/15 0949 01/17/15 0959  WBC 17.7* 13.9*  NEUTROABS 11.9*  --   HGB 14.8 14.0  HCT 47.0* 43.1  MCV 88.0 85.2  PLT 219 248   CBG:  Recent Labs Lab 01/17/15 1221  GLUCAP 241*    Recent Results (from the past 240 hour(s))  Blood culture (routine x 2)     Status: None (Preliminary result)   Collection Time: 01/16/15 10:10 AM  Result Value Ref Range Status   Specimen Description BLOOD RIGHT HAND  Final   Special Requests BOTTLES DRAWN AEROBIC AND ANAEROBIC  Final   Culture   Final    NO GROWTH 1 DAY Performed at [facility]    Report Status PENDING  Incomplete  Blood culture (routine x 2)     Status: None (Preliminary result)   Collection Time: 01/16/15 10:39 AM  Result Value Ref Range Status   Specimen Description BLOOD LEFT ANTECUBITAL  Final   Special Requests BOTTLES DRAWN AEROBIC AND ANAEROBIC 5CC  Final   Culture   Final    NO GROWTH 1 DAY Performed at [facility]    Report Status PENDING  Incomplete  Urine culture     Status: None (Preliminary result)   Collection Time: 01/16/15 11:33 AM  Result Value Ref Range Status   Specimen Description URINE, RANDOM   Final   Special Requests NONE  Final   Culture   Final    >=100,000 COLONIES/mL ESCHERICHIA COLI Performed at [facility]    Report Status PENDING  Incomplete  MRSA PCR Screening     Status: Abnormal   Collection Time: 01/16/15  3:33 PM  Result Value Ref Range Status   MRSA by PCR INVALID RESULTS, SPECIMEN SENT FOR CULTURE (A) NEGATIVE Final    Comment: RESULT CALLED TO, READ BACK BY AND VERIFIED WITH: JESSICA DEUTSCHIA,RN [id number] @ 1710 BY J SCOTTON        The  GeneXpert MRSA Assay (FDA approved for NASAL specimens only), is one component of a comprehensive MRSA colonization surveillance program. It is not intended to diagnose MRSA infection nor to guide or monitor treatment for MRSA infections.   Clostridium Difficile by PCR (not at [facility])     Status: None   Collection Time: 01/17/15  9:30 AM  Result Value Ref Range Status   C difficile by pcr NEGATIVE NEGATIVE Final     Scheduled Meds: . aspirin EC  81 mg Oral q morning - 10a  . baclofen  10 mg Oral TID  . calcium-vitamin D  1 tablet Oral Q breakfast  . cefTRIAXone (ROCEPHIN)  IV  1 g Intravenous Q24H  . enoxaparin (LOVENOX) injection  40 mg Subcutaneous Q24H  . ezetimibe  10 mg Oral q morning - 10a  . folic acid  1 mg Oral Daily  . gabapentin  300 mg Oral TID  . insulin aspart  0-9 Units Subcutaneous TID WC  . magnesium oxide  400 mg Oral BID  . OxyCODONE  20 mg Oral BID  . pantoprazole  40 mg Oral Daily  . predniSONE  15 mg Oral Q breakfast  . rOPINIRole  0.5 mg Oral BID  . tamsulosin  0.4 mg Oral QHS  . temazepam  15 mg Oral QHS  . torsemide  20 mg Oral BID   Continuous Infusions:

## 2015-01-17 NOTE — Progress Notes (Signed)
Resumed care of patient. Agree with previous assessment. Will continue to monitor. 

## 2015-01-18 LAB — BASIC METABOLIC PANEL
Anion gap: 13 (ref 5–15)
BUN: 11 mg/dL (ref 6–20)
CHLORIDE: 98 mmol/L — AB (ref 101–111)
CO2: 30 mmol/L (ref 22–32)
Calcium: 9.3 mg/dL (ref 8.9–10.3)
Creatinine, Ser: 0.59 mg/dL (ref 0.44–1.00)
GFR calc Af Amer: >60 mL/min (ref >60)
GFR calc non Af Amer: >60 mL/min (ref >60)
Glucose, Bld: 155 mg/dL — ABNORMAL HIGH (ref 65–99)
Potassium: 3.4 mmol/L — ABNORMAL LOW (ref 3.5–5.1)
Sodium: 141 mmol/L (ref 135–145)

## 2015-01-18 LAB — URINE CULTURE: Culture: >=100000

## 2015-01-18 LAB — CBC
HEMATOCRIT: 42.4 % (ref 36.0–46.0)
Hemoglobin: 14 g/dL (ref 12.0–15.0)
MCH: 27.8 pg (ref 26.0–34.0)
MCHC: 33 g/dL (ref 30.0–36.0)
MCV: 84.1 fL (ref 78.0–100.0)
Platelets: 215 10*3/uL (ref 150–400)
RBC: 5.04 MIL/uL (ref 3.87–5.11)
RDW: 14.7 % (ref 11.5–15.5)
WBC: 13.1 10*3/uL — ABNORMAL HIGH (ref 4.0–10.5)

## 2015-01-18 LAB — MRSA CULTURE

## 2015-01-18 LAB — GLUCOSE, CAPILLARY
GLUCOSE-CAPILLARY: 216 mg/dL — AB (ref 65–99)
Glucose-Capillary: 196 mg/dL — ABNORMAL HIGH (ref 65–99)
Glucose-Capillary: 162 mg/dL — ABNORMAL HIGH (ref 65–99)
Glucose-Capillary: 199 mg/dL — ABNORMAL HIGH (ref 65–99)

## 2015-01-18 LAB — HEMOGLOBIN A1C
Hgb A1c MFr Bld: 7.4 % — ABNORMAL HIGH (ref 4.8–5.6)
MEAN PLASMA GLUCOSE: 166 mg/dL

## 2015-01-18 MED ORDER — SULFAMETHOXAZOLE-TRIMETHOPRIM 800-160 MG PO TABS
1.0000 | ORAL_TABLET | Freq: Two times a day (BID) | ORAL | Status: DC
  Administered 2015-01-18 – 2015-01-19 (×3): 1 via ORAL
  Filled 2015-01-18 (×3): qty 1

## 2015-01-18 MED ORDER — POTASSIUM CHLORIDE CRYS ER 20 MEQ PO TBCR
40.0000 meq | EXTENDED_RELEASE_TABLET | Freq: Once | ORAL | Status: AC
  Administered 2015-01-18: 40 meq via ORAL
  Filled 2015-01-18: qty 2

## 2015-01-18 MED ORDER — PIPERACILLIN-TAZOBACTAM 3.375 G IVPB
3.3750 g | Freq: Three times a day (TID) | INTRAVENOUS | Status: DC
  Administered 2015-01-18: 3.375 g via INTRAVENOUS
  Filled 2015-01-18 (×2): qty 50

## 2015-01-18 MED ORDER — OXYCODONE HCL 5 MG PO TABS
5.0000 mg | ORAL_TABLET | Freq: Four times a day (QID) | ORAL | Status: DC | PRN
  Administered 2015-01-19: 5 mg via ORAL
  Filled 2015-01-18: qty 1

## 2015-01-18 NOTE — Progress Notes (Addendum)
PT Cancellation/Screen Note  Patient Details Name: [name] MeekerLinda [name] MRN: [id number] DOB: Dec 03, 1945   Cancelled Treatment:    Reason Eval/Treat Not Completed: PT screened, no needs identified, will sign off. Pt is total care at baseline and requires hoyer lift for transfers. Pt has 24 hour caregivers. Therapist familiar with pt from previous admission. Will sign off at this time. Please reorder if needs change/different d/c plan needs to be established. Thanks. Recommend repositioning as able with nursing. Consider OT order if pt wishes to work on UE ROM/strengthening.   Rebeca Alertorter, Rakin Lemelle [facility] 01/18/2015, 8:32 AM

## 2015-01-18 NOTE — Progress Notes (Addendum)
Patient ID: [name], female   DOB: 17-Jun-1946, 69 y.o.   MRN: [id number]  [facility] PROGRESS NOTE  [name] [name] NTZ:[id number] DOB: 10/24/1945 DOA: 01/16/2015 PCP: Paris Lore, MD   Brief narrative:    Patient is 69 year old female, paraplegic, presented to [facility] emergency department for evaluation of several days duration off progressively lethargy. Please note that patient is unable to provide detailed history due to confusion and lethargy, no family present at the time of the admission. Most of the records obtained from emergency room doctor. Patient Presented to ED via EMS. There is a report of hematuria, urinary urgency and frequency, dysuria per son who was available at bedside earlier upon arrival to ED. No report of chest pain or shortness of breath, no other abdominal concerns.  In emergency department, patient noted to be tachycardic with heart rate up to 118, T 99.5, blood work notable for WBC 17 K, lactic acid higher than 3. Urinalysis suggestive of UTI. [facility] asked to admit patient to telemetry bed for evaluation and management of presumptive sepsis secondary to UTI.   Assessment/Plan:    Active Problems: Sepsis secondary to UTI, unknown pathogen at this time - Patient met criteria for sepsis with heart rate up to 118, WBC 17 K, elevated lactic acid, source UTI - pt still with low grade fever 100.1 F in the past 24 hours, HR up to 109, WBC still elevated at 13.1  - urine culture now positive for E. 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 Saundra Shelling - we have checked this with the office, records requested, still waiting  - will continue Prednisone as per home medical regimen  - methotrexate given Q Tuesday   Obesity  - Body mass index is 33.26 kg/(m^2).  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  Family Communication:  plan of care discussed with the patient, called son on his mobile phone and left message with my number to call me back for update  Disposition Plan: Home when stable. Pt has 4 care givers at home and desires to return home when ready.   IV access:  Peripheral IV  Procedures and diagnostic studies:    Dg Chest 1 View 01/16/2015  No edema or consolidation.     US Abdomen Limited Ruq 01/16/2015  Gallbladder is full. No gallstones or wall thickening visualized. No sonographic Murphy sign noted.  Increased echogenicity of the liver suggestive of fatty  infiltration with poor penetration without obvious mass.    Medical Consultants:  None  Other Consultants:  None  IAnti-Infectives:   Rocephin 7/12 --> 7/14 Bactrim 7/14 --> 7/20  Faye Ramsay, MD  [facility] Pager [phone]  If 7PM-7AM, please contact night-coverage [email or web address] Password [email or web address] 01/18/2015, 4:11 PM   LOS: 2 days   HPI/Subjective: No events overnight. Pt reports feeling better.   Objective: Filed Vitals:   01/17/15 1717 01/17/15 2019 01/18/15 0625 01/18/15 1304  BP: 157/91  156/95 144/89 144/87  Pulse:  102 110 109  Temp: 99.4 F (37.4 C) 98.6 F (37 C) 98.3 F (36.8 C) 98.2 F (36.8 C)  TempSrc: Oral Oral Oral Oral  Resp:  _0 Height:      Weight:   82.4 kg (181 lb 10.5 oz)   SpO2:  98% 98% 97%    Intake/Output Summary (Last 24 hours) at 01/18/15 1611 Last data filed at 01/18/15 1255  Gross per 24 hour  Intake    650 ml  Output      0 ml  Net    650 ml    Exam:   General:  Pt is alert, follows commands appropriately, not in acute distress  Cardiovascular: Regular rhythm, tachycardic, no rubs, no gallops  Respiratory: Clear to auscultation bilaterally, no wheezing, diminished breath sounds at bases   Abdomen: Soft, non tender, non distended, bowel sounds present, no guarding  Extremities: pulses DP and PT palpable bilaterally   Data Reviewed: Basic Metabolic Panel:  Recent Labs Lab 01/16/15 0949 01/17/15 0959 01/18/15 0402  NA 141 141 141  K 3.5 3.6 3.4*  CL 97* 107 98*  CO2 _1 GLUCOSE 137* 168* 155*  BUN 24* 11 11  CREATININE 0.77 0.50 0.59  CALCIUM 9.6 9.0 9.3  MG  --  1.8  --   PHOS  --  3.4  --    Liver Function Tests:  Recent Labs Lab 01/16/15 0949  AST 27  ALT 29  ALKPHOS 61  BILITOT 1.0  PROT 6.9  ALBUMIN 3.9    Recent Labs Lab 01/16/15 0949  LIPASE 14*   CBC:  Recent Labs Lab 01/16/15 0949 01/17/15 0959 01/18/15 0402  WBC 17.7* 13.9* 13.1*  NEUTROABS 11.9*  --   --   HGB 14.8 14.0 14.0  HCT 47.0* 43.1 42.4  MCV 88.0 85.2 84.1  PLT 219 248 215   CBG:  Recent Labs Lab 01/17/15 1221 01/17/15 1710 01/17/15 2054 01/18/15 0744  GLUCAP 241* 259* 196* 162*    Recent Results (from the past 240 hour(s))  Blood culture (routine x 2)     Status: None (Preliminary result)   Collection Time: 01/16/15 10:10 AM  Result Value Ref Range Status   Specimen Description BLOOD RIGHT HAND  Final   Special Requests BOTTLES DRAWN AEROBIC AND ANAEROBIC 5ML  Final   Culture   Final    NO  GROWTH 2 DAYS Performed at [facility]    Report Status PENDING  Incomplete  Blood culture (routine x 2)     Status: None (Preliminary result)   Collection Time: 01/16/15 10:39 AM  Result Value Ref Range Status   Specimen Description BLOOD LEFT ANTECUBITAL  Final   Special Requests BOTTLES DRAWN AEROBIC AND ANAEROBIC 5CC  Final   Culture   Final    NO GROWTH 2 DAYS Performed at [facility]    Report Status PENDING  Incomplete  Urine culture  Status: None   Collection Time: 01/16/15 11:33 AM  Result Value Ref Range Status   Specimen Description URINE, RANDOM  Final   Special Requests NONE  Final   Culture   Final    >=100,000 COLONIES/mL ESCHERICHIA COLI Confirmed Extended Spectrum Beta-Lactamase Producer (ESBL) Performed at [facility]    Report Status 01/18/2015 FINAL  Final   Organism ID, Bacteria ESCHERICHIA COLI  Final      Susceptibility   Escherichia coli - MIC*    AMPICILLIN >=32 RESISTANT Resistant     CEFAZOLIN >=64 RESISTANT Resistant     CEFTRIAXONE >=64 RESISTANT Resistant     CIPROFLOXACIN >=4 RESISTANT Resistant     GENTAMICIN <=1 SENSITIVE Sensitive     IMIPENEM <=0.25 SENSITIVE Sensitive     NITROFURANTOIN <=16 SENSITIVE Sensitive     TRIMETH/SULFA <=20 SENSITIVE Sensitive     AMPICILLIN/SULBACTAM >=32 RESISTANT Resistant     PIP/TAZO <=4 SENSITIVE Sensitive     * >=100,000 COLONIES/mL ESCHERICHIA COLI  MRSA PCR Screening     Status: Abnormal   Collection Time: 01/16/15  3:33 PM  Result Value Ref Range Status   MRSA by PCR INVALID RESULTS, SPECIMEN SENT FOR CULTURE (A) NEGATIVE Final    Comment: RESULT CALLED TO, READ BACK BY AND VERIFIED WITH: JESSICA DEUTSCHIA,RN [id number] @ 16 BY J SCOTTON        The GeneXpert MRSA Assay (FDA approved for NASAL specimens only), is one component of a comprehensive MRSA colonization surveillance program. It is not intended to diagnose MRSA infection nor to guide or monitor treatment  for MRSA infections.   MRSA culture     Status: None   Collection Time: 01/16/15  3:33 PM  Result Value Ref Range Status   Specimen Description NOSE  Final   Special Requests NONE  Final   Culture NOMRSA Performed at [facility]   Final   Report Status 01/18/2015 FINAL  Final  Clostridium Difficile by PCR (not at [facility])     Status: None   Collection Time: 01/17/15  9:30 AM  Result Value Ref Range Status   C difficile by pcr NEGATIVE NEGATIVE Final    . aspirin EC  81 mg Oral q morning - 10a  . baclofen  10 mg Oral TID  . calcium-vitamin D  1 tablet Oral Q breakfast  . enoxaparin (LOVENOX) injection  40 mg Subcutaneous Q24H  . ezetimibe  10 mg Oral q morning - 33K  . folic acid  1 mg Oral Daily  . gabapentin  300 mg Oral TID  . insulin aspart  0-9 Units Subcutaneous TID WC  . magnesium oxide  400 mg Oral BID  . OxyCODONE  20 mg Oral BID  . pantoprazole  40 mg Oral Daily  . predniSONE  15 mg Oral Q breakfast  . rOPINIRole  0.5 mg Oral BID  . sulfamethoxazole-trimethoprim  1 tablet Oral Q12H  . tamsulosin  0.4 mg Oral QHS  . temazepam  15 mg Oral QHS  . torsemide  20 mg Oral BID

## 2015-01-18 NOTE — Progress Notes (Signed)
Nutrition Brief Note  RD consulted for nutritional assessment.  Pt with paraplegia and weight gain over the last 5 months. Pt struggles with physical activity d/t being bedbound. States that her caregivers will do arm exercises with her.   Pt denies she has diabetes but reviewed diet education with pt. Discussed importance of controlled and consistent carbohydrate intake throughout the day. Provided examples of ways to balance meals/snacks and encouraged intake of high-fiber, whole grain complex carbohydrates.   Lab Results  Component Value Date   HGBA1C 7.4* 01/17/2015    Wt Readings from Last 15 Encounters:  01/18/15 181 lb 10.5 oz (82.4 kg)  08/27/14 168 lb 6.9 oz (76.4 kg)  04/18/14 160 lb (72.576 kg)  03/29/14 171 lb 4.8 oz (77.7 kg)  03/14/14 165 lb 2 oz (74.9 kg)  06/10/13 173 lb (78.472 kg)    Body mass index is 33.22 kg/(m^2). Patient meets criteria for obesity based on current BMI.   Current diet order is regular, patient is consuming approximately 100% of meals at this time. Labs and medications reviewed.   No other nutrition interventions warranted at this time. If nutrition issues arise, please consult RD.   Tilda FrancoLindsey Loralye Loberg, MS, RD, LDN Pager: [phone] After Hours Pager: [phone]

## 2015-01-18 NOTE — Care Management Important Message (Signed)
Important Message  Patient Details  IM Letter given to Susan/RN to present to patient Important Message  Patient Details  Name: [name] MeekerLinda [name] MRN: [id number] Date of Birth: 11-17-1945   Medicare Important Message Given[facility]  [facility]    Haskell FlirtJamison, Rahkim Rabalais 01/18/2015, 11:36 AM Name: [name] MeekerLinda [name] MRN: [id number] Date of Birth: 11-17-1945   Medicare Important Message Given:  Yes-second notification given    Haskell FlirtJamison, Ashanta Amoroso 01/18/2015, 11:35 AM

## 2015-01-19 DIAGNOSIS — A4151 Sepsis due to Escherichia coli [E. coli]: Secondary | ICD-10-CM

## 2015-01-19 DIAGNOSIS — R531 Weakness: Secondary | ICD-10-CM

## 2015-01-19 LAB — BASIC METABOLIC PANEL
Anion gap: 14 (ref 5–15)
BUN: 13 mg/dL (ref 6–20)
CO2: 31 mmol/L (ref 22–32)
CREATININE: 0.75 mg/dL (ref 0.44–1.00)
Calcium: 9.4 mg/dL (ref 8.9–10.3)
Chloride: 96 mmol/L — ABNORMAL LOW (ref 101–111)
GFR calc non Af Amer: >60 mL/min (ref >60)
Glucose, Bld: 158 mg/dL — ABNORMAL HIGH (ref 65–99)
Potassium: 3.1 mmol/L — ABNORMAL LOW (ref 3.5–5.1)
SODIUM: 141 mmol/L (ref 135–145)

## 2015-01-19 LAB — GLUCOSE, CAPILLARY
GLUCOSE-CAPILLARY: 172 mg/dL — AB (ref 65–99)
Glucose-Capillary: 170 mg/dL — ABNORMAL HIGH (ref 65–99)
Glucose-Capillary: 173 mg/dL — ABNORMAL HIGH (ref 65–99)

## 2015-01-19 LAB — CBC
HCT: 46.9 % — ABNORMAL HIGH (ref 36.0–46.0)
Hemoglobin: 15.3 g/dL — ABNORMAL HIGH (ref 12.0–15.0)
MCH: 28.2 pg (ref 26.0–34.0)
MCHC: 32.6 g/dL (ref 30.0–36.0)
MCV: 86.4 fL (ref 78.0–100.0)
PLATELETS: 279 10*3/uL (ref 150–400)
RBC: 5.43 MIL/uL — ABNORMAL HIGH (ref 3.87–5.11)
RDW: 15.1 % (ref 11.5–15.5)
WBC: 16.6 10*3/uL — AB (ref 4.0–10.5)

## 2015-01-19 MED ORDER — INSULIN ASPART 100 UNIT/ML ~~LOC~~ SOLN
0.0000 [IU] | Freq: Three times a day (TID) | SUBCUTANEOUS | Status: DC
  Administered 2015-01-19: 3 [IU] via SUBCUTANEOUS

## 2015-01-19 MED ORDER — INSULIN ASPART 100 UNIT/ML ~~LOC~~ SOLN: 0.0000 [IU] | Freq: Every day | SUBCUTANEOUS | Status: DC

## 2015-01-19 MED ORDER — POTASSIUM CHLORIDE CRYS ER 20 MEQ PO TBCR
20.0000 meq | EXTENDED_RELEASE_TABLET | Freq: Two times a day (BID) | ORAL | Status: DC
  Administered 2015-01-19: 20 meq via ORAL
  Filled 2015-01-19: qty 1

## 2015-01-19 MED ORDER — SULFAMETHOXAZOLE-TRIMETHOPRIM 800-160 MG PO TABS: 1.0000 | ORAL_TABLET | Freq: Two times a day (BID) | ORAL | Status: DC

## 2015-01-19 NOTE — Discharge Summary (Signed)
Physician Discharge Summary  Gerlean [name] OZD:[id number] DOB: 10/01/1945 DOA: 01/16/2015  PCP: Paris Lore, MD  Admit date: 01/16/2015 Discharge date: 01/19/2015   Recommendations for Outpatient Follow-Up:   1. Recommend follow-up with PCP in 2 weeks for hospital follow-up. Please follow-up on final blood culture results which were negative at discharge but not finalized.   Discharge Diagnosis:   Principal Problem:    Sepsis secondary to Escherichia coli UTI Active Problems:    UTI (lower urinary tract infection)    Metabolic encephalopathy    Altered mental state    Weakness    Paraplegia   Discharge disposition:  Home.    Discharge Condition: Improved.  Diet recommendation: Low sodium, heart healthy.    History of Present Illness:   [name] [name] is an 69 y.o. female with a PMH of paraplegia who was admitted 01/16/15 with worsening lethargy who was admitted with sepsis secondary to a UTI.   Hospital Course by Problem:   Principal problem: Sepsis secondary to Escherichia coli UTI - Patient met criteria for sepsis with heart rate up to 118, WBC 17 K, elevated lactic acid, source UTI. - Initially treated with Rocephin, organism resistant, subsequently started on Bactrim 01/18/15.  - Her blood cultures have been negative to date.   Active problems: Acute encephalopathy, metabolic - Secondary to sepsis from UTI. - Mental status back to baseline at discharge.  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 - Currently being managed with insulin sensitive SSI Q AC. CBGs 162-216. - Change SSI to moderate scale Q AC/HS. She may resume home regimen at discharge. - Continue Neurontin for neuropathic pain.   Chronic grade I diastolic CHF - Last 2 D ECHO 2015 with normal EF and grade I diastolic CHF. - Continue home dose of Torsemide, weight remains stable at 82.5 kg since admission.  - Compensated throughout hospital  stay.  Hypokalemia - Supplemented with oral potassium and discharged on further oral supplementation.   Skin rash - She was apparently started on Methotrexate and Prednisone by dermatologist Dr. Saundra Shelling. - Recommend close follow-up with dermatologist.   Obesity  - Body mass index is 33.26 kg/(m^2).  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 Continue baclofen.  Chronic pain syndrome - Continue home analgesic regimen.    Medical Consultants:    None.   Discharge Exam:   Filed Vitals:   01/19/15 0506  BP: 146/81  Pulse: 105  Temp: 98.1 F (36.7 C)  Resp: 16   Filed Vitals:   01/18/15 1304 01/18/15 2139 01/18/15 2305 01/19/15 0506  BP: 144/87 179/87 132/91 146/81  Pulse: 109 102  105  Temp: 98.2 F (36.8 C) 98.2 F (36.8 C)  98.1 F (36.7 C)  TempSrc: Oral Oral  Oral  Resp: '18 18  16  ' Height:      Weight:    79.6 kg (175 lb 7.8 oz)  SpO2: 97% 98%  97%    Gen:  NAD Cardiovascular:  RRR, No M/R/G Respiratory: Lungs CTAB Gastrointestinal: Abdomen soft, NT/ND with normal active bowel sounds. Extremities: No C/E/C   The results of significant diagnostics from this hospitalization (including imaging, microbiology, ancillary and laboratory) are listed below for reference.     Procedures and Diagnostic Studies:   Dg Chest 1 View  01/16/2015   CLINICAL DATA:  Cough and low-grade fever  EXAM: CHEST  1 VIEW  COMPARISON:  August 25, 2014  FINDINGS: Lungs are clear. Heart size and  pulmonary vascularity are normal. No adenopathy. No bone lesions. There is postoperative change in the lower cervical spine.  IMPRESSION: No edema or consolidation.   Electronically Signed   By: Lowella Grip III M.D.   On: 01/16/2015 11:29   US Abdomen Limited Ruq  01/16/2015   CLINICAL DATA:  69 year old female with right-sided pain. Subsequent encounter.  EXAM: US ABDOMEN LIMITED - RIGHT UPPER QUADRANT  COMPARISON:  03/16/2014 ultrasound.  FINDINGS:  Gallbladder:  Gallbladder is full. No gallstones or wall thickening visualized. No sonographic Murphy sign noted.  Common bile duct:  Diameter: 2.3 mm proximally. Mid to distal aspect not visualized secondary to bowel gas.  Liver:  Increased echogenicity suggestive of fatty infiltration with poor penetration without obvious mass.  IMPRESSION: Gallbladder is full. No gallstones or wall thickening visualized. No sonographic Murphy sign noted.  Increased echogenicity of the liver suggestive of fatty infiltration with poor penetration without obvious mass.   Electronically Signed   By: Genia Del M.D.   On: 01/16/2015 12:45     Labs:   Basic Metabolic Panel:  Recent Labs Lab 01/16/15 0949 01/17/15 0959 01/18/15 0402 01/19/15 0513  NA 141 141 141 141  K 3.5 3.6 3.4* 3.1*  CL 97* 107 98* 96*  CO2 '30 23 30 31  ' GLUCOSE 137* 168* 155* 158*  BUN 24* '11 11 13  ' CREATININE 0.77 0.50 0.59 0.75  CALCIUM 9.6 9.0 9.3 9.4  MG  --  1.8  --   --   PHOS  --  3.4  --   --    GFR Estimated Creatinine Clearance: 63.9 mL/min (by C-G formula based on Cr of 0.75). Liver Function Tests:  Recent Labs Lab 01/16/15 0949  AST 27  ALT 29  ALKPHOS 61  BILITOT 1.0  PROT 6.9  ALBUMIN 3.9    Recent Labs Lab 01/16/15 0949  LIPASE 14*   CBC:  Recent Labs Lab 01/16/15 0949 01/17/15 0959 01/18/15 0402 01/19/15 0513  WBC 17.7* 13.9* 13.1* 16.6*  NEUTROABS 11.9*  --   --   --   HGB 14.8 14.0 14.0 15.3*  HCT 47.0* 43.1 42.4 46.9*  MCV 88.0 85.2 84.1 86.4  PLT 219 248 215 279   CBG:  Recent Labs Lab 01/18/15 1250 01/18/15 1850 01/18/15 2303 01/19/15 0725 01/19/15 1152  GLUCAP 199* 216* 170* 173* 172*   Hgb A1c  Recent Labs  01/17/15 1030  HGBA1C 7.4*   Thyroid function studies  Recent Labs  01/17/15 0959  TSH 2.571   Microbiology Recent Results (from the past 240 hour(s))  Blood culture (routine x 2)     Status: None (Preliminary result)   Collection Time: 01/16/15 10:10  AM  Result Value Ref Range Status   Specimen Description BLOOD RIGHT HAND  Final   Special Requests BOTTLES DRAWN AEROBIC AND ANAEROBIC 5ML  Final   Culture   Final    NO GROWTH 3 DAYS Performed at [facility]    Report Status PENDING  Incomplete  Blood culture (routine x 2)     Status: None (Preliminary result)   Collection Time: 01/16/15 10:39 AM  Result Value Ref Range Status   Specimen Description BLOOD LEFT ANTECUBITAL  Final   Special Requests BOTTLES DRAWN AEROBIC AND ANAEROBIC 5CC  Final   Culture   Final    NO GROWTH 3 DAYS Performed at [facility]    Report Status PENDING  Incomplete  Urine culture     Status: None  Collection Time: 01/16/15 11:33 AM  Result Value Ref Range Status   Specimen Description URINE, RANDOM  Final   Special Requests NONE  Final   Culture   Final    >=100,000 COLONIES/mL ESCHERICHIA COLI Confirmed Extended Spectrum Beta-Lactamase Producer (ESBL) Performed at [facility]    Report Status 01/18/2015 FINAL  Final   Organism ID, Bacteria ESCHERICHIA COLI  Final      Susceptibility   Escherichia coli - MIC*    AMPICILLIN >=32 RESISTANT Resistant     CEFAZOLIN >=64 RESISTANT Resistant     CEFTRIAXONE >=64 RESISTANT Resistant     CIPROFLOXACIN >=4 RESISTANT Resistant     GENTAMICIN <=1 SENSITIVE Sensitive     IMIPENEM <=0.25 SENSITIVE Sensitive     NITROFURANTOIN <=16 SENSITIVE Sensitive     TRIMETH/SULFA <=20 SENSITIVE Sensitive     AMPICILLIN/SULBACTAM >=32 RESISTANT Resistant     PIP/TAZO <=4 SENSITIVE Sensitive     * >=100,000 COLONIES/mL ESCHERICHIA COLI  MRSA PCR Screening     Status: Abnormal   Collection Time: 01/16/15  3:33 PM  Result Value Ref Range Status   MRSA by PCR INVALID RESULTS, SPECIMEN SENT FOR CULTURE (A) NEGATIVE Final    Comment: RESULT CALLED TO, READ BACK BY AND VERIFIED WITH: JESSICA DEUTSCHIA,RN [id number] @ 59 BY J SCOTTON        The GeneXpert MRSA Assay (FDA approved for NASAL  specimens only), is one component of a comprehensive MRSA colonization surveillance program. It is not intended to diagnose MRSA infection nor to guide or monitor treatment for MRSA infections.   MRSA culture     Status: None   Collection Time: 01/16/15  3:33 PM  Result Value Ref Range Status   Specimen Description NOSE  Final   Special Requests NONE  Final   Culture NOMRSA Performed at [facility]   Final   Report Status 01/18/2015 FINAL  Final  Clostridium Difficile by PCR (not at [facility])     Status: None   Collection Time: 01/17/15  9:30 AM  Result Value Ref Range Status   C difficile by pcr NEGATIVE NEGATIVE Final     Discharge Instructions:   Discharge Instructions    Call MD for:  extreme fatigue    Complete by:  As directed      Call MD for:  persistant nausea and vomiting    Complete by:  As directed      Call MD for:  severe uncontrolled pain    Complete by:  As directed      Call MD for:  temperature >100.4    Complete by:  As directed      Diet - low sodium heart healthy    Complete by:  As directed      Increase activity slowly    Complete by:  As directed             Medication List    TAKE these medications        ADVIL ALLERGY SINUS 2-30-200 MG Tabs  Generic drug:  Chlorpheniramine-PSE-Ibuprofen  Take 1 tablet by mouth daily as needed (for allergies).     ADVIL PM 200-38 MG Tabs  Generic drug:  Ibuprofen-Diphenhydramine Cit  Take 1-2 tablets by mouth at bedtime as needed (for pain and sleep).     aspirin EC 81 MG tablet  Take 81 mg by mouth every morning.     baclofen 10 MG tablet  Commonly known as:  LIORESAL  Take  10 mg by mouth 3 (three) times daily.     calcium-vitamin D 500-200 MG-UNIT per tablet  Commonly known as:  OSCAL WITH D  Take 1 tablet by mouth daily with breakfast.     [employer] SPECTRAVITE ADULT 50+ PO  Take 1 tablet by mouth daily.     ezetimibe 10 MG tablet  Commonly known as:  ZETIA  Take 10 mg by mouth every  morning.     feeding supplement (ENSURE) Pudg  Take 1 Container by mouth 2 (two) times daily between meals.     folic acid 1 MG tablet  Commonly known as:  FOLVITE  Take 1 mg by mouth daily.     gabapentin 300 MG capsule  Commonly known as:  NEURONTIN  Take 300 mg by mouth 3 (three) times daily.     LORazepam 1 MG tablet  Commonly known as:  ATIVAN  Take 0.5 tablets (0.5 mg total) by mouth every 12 (twelve) hours as needed for anxiety.     magnesium oxide 400 MG tablet  Commonly known as:  MAG-OX  Take 400 mg by mouth 2 (two) times daily.     methotrexate 2.5 MG tablet  Commonly known as:  RHEUMATREX  Take 7.5 mg by mouth every Tuesday. Caution:Chemotherapy. Protect from light.     omeprazole 20 MG capsule  Commonly known as:  PRILOSEC  Take 20 mg by mouth daily as needed (for acid reflex).     OxyCODONE 20 mg T12a 12 hr tablet  Commonly known as:  OXYCONTIN  Take 1 tablet (20 mg total) by mouth 2 (two) times daily.     polyethylene glycol packet  Commonly known as:  MIRALAX / GLYCOLAX  Take 17 g by mouth daily as needed for mild constipation.     potassium chloride SA 20 MEQ tablet  Commonly known as:  K-DUR,KLOR-CON  Take 1 tablet (20 mEq total) by mouth 2 (two) times daily.     predniSONE 5 MG tablet  Commonly known as:  DELTASONE  Take 15 mg by mouth daily with breakfast.     rOPINIRole 0.5 MG tablet  Commonly known as:  REQUIP  Take 0.5 mg by mouth 2 (two) times daily.     spironolactone 25 MG tablet  Commonly known as:  ALDACTONE  Take 1 tablet (25 mg total) by mouth daily.     sulfamethoxazole-trimethoprim 800-160 MG per tablet  Commonly known as:  BACTRIM DS,SEPTRA DS  Take 1 tablet by mouth every 12 (twelve) hours.     tamsulosin 0.4 MG Caps capsule  Commonly known as:  FLOMAX  Take 0.4 mg by mouth at bedtime.     temazepam 15 MG capsule  Commonly known as:  RESTORIL  Take 15 mg by mouth at bedtime.     torsemide 20 MG tablet  Commonly known  as:  DEMADEX  Take 20 mg by mouth 2 (two) times daily.     trifluoperazine 5 MG tablet  Commonly known as:  STELAZINE  Take 5 mg by mouth 2 (two) times daily as needed (for itching).           Follow-up Information    Follow up with Paris Lore, MD. Schedule an appointment as soon as possible for a visit in 2 weeks.   Specialty:  Family Medicine   Why:  Hospital follow up.   Contact information:   [address] [place] [address] [phone]        Time coordinating discharge:  35 minutes.  Signed:  Kuba Shepherd  Pager [phone] [facility] 01/19/2015, 6:36 PM

## 2015-01-19 NOTE — Progress Notes (Signed)
Patient discharged to home with son.  Discharge instructions given.  Patient is stable without complaint.

## 2015-01-21 ENCOUNTER — Inpatient Hospital Stay (HOSPITAL_COMMUNITY)
Admission: EM | Admit: 2015-01-21 | Discharge: 2015-01-29 | DRG: 871 | Disposition: A | Discharge status: Home Health | Payer: Medicare Other | Attending: Internal Medicine | Admitting: Internal Medicine

## 2015-01-21 ENCOUNTER — Emergency Department (HOSPITAL_COMMUNITY): Payer: Medicare Other

## 2015-01-21 DIAGNOSIS — J9600 Acute respiratory failure, unspecified whether with hypoxia or hypercapnia: Secondary | ICD-10-CM | POA: Diagnosis present

## 2015-01-21 DIAGNOSIS — E669 Obesity, unspecified: Secondary | ICD-10-CM | POA: Diagnosis present

## 2015-01-21 DIAGNOSIS — J988 Other specified respiratory disorders: Secondary | ICD-10-CM

## 2015-01-21 DIAGNOSIS — Z7952 Long term (current) use of systemic steroids: Secondary | ICD-10-CM

## 2015-01-21 DIAGNOSIS — L120 Bullous pemphigoid: Secondary | ICD-10-CM | POA: Diagnosis present

## 2015-01-21 DIAGNOSIS — Z7982 Long term (current) use of aspirin: Secondary | ICD-10-CM

## 2015-01-21 DIAGNOSIS — G8250 Quadriplegia, unspecified: Secondary | ICD-10-CM | POA: Diagnosis present

## 2015-01-21 DIAGNOSIS — I10 Essential (primary) hypertension: Secondary | ICD-10-CM | POA: Diagnosis present

## 2015-01-21 DIAGNOSIS — T3695XA Adverse effect of unspecified systemic antibiotic, initial encounter: Secondary | ICD-10-CM | POA: Diagnosis present

## 2015-01-21 DIAGNOSIS — B9620 Unspecified Escherichia coli [E. coli] as the cause of diseases classified elsewhere: Secondary | ICD-10-CM | POA: Diagnosis present

## 2015-01-21 DIAGNOSIS — Z79899 Other long term (current) drug therapy: Secondary | ICD-10-CM

## 2015-01-21 DIAGNOSIS — A419 Sepsis, unspecified organism: Principal | ICD-10-CM | POA: Diagnosis present

## 2015-01-21 DIAGNOSIS — E876 Hypokalemia: Secondary | ICD-10-CM | POA: Diagnosis present

## 2015-01-21 DIAGNOSIS — G9341 Metabolic encephalopathy: Secondary | ICD-10-CM | POA: Diagnosis present

## 2015-01-21 DIAGNOSIS — G9340 Encephalopathy, unspecified: Secondary | ICD-10-CM | POA: Diagnosis not present

## 2015-01-21 DIAGNOSIS — E785 Hyperlipidemia, unspecified: Secondary | ICD-10-CM | POA: Diagnosis present

## 2015-01-21 DIAGNOSIS — Z87891 Personal history of nicotine dependence: Secondary | ICD-10-CM

## 2015-01-21 DIAGNOSIS — R739 Hyperglycemia, unspecified: Secondary | ICD-10-CM | POA: Diagnosis present

## 2015-01-21 DIAGNOSIS — N390 Urinary tract infection, site not specified: Secondary | ICD-10-CM | POA: Diagnosis present

## 2015-01-21 DIAGNOSIS — Z791 Long term (current) use of non-steroidal anti-inflammatories (NSAID): Secondary | ICD-10-CM | POA: Diagnosis not present

## 2015-01-21 DIAGNOSIS — K219 Gastro-esophageal reflux disease without esophagitis: Secondary | ICD-10-CM | POA: Diagnosis present

## 2015-01-21 DIAGNOSIS — Z781 Physical restraint status: Secondary | ICD-10-CM | POA: Diagnosis not present

## 2015-01-21 DIAGNOSIS — A4151 Sepsis due to Escherichia coli [E. coli]: Secondary | ICD-10-CM | POA: Diagnosis not present

## 2015-01-21 DIAGNOSIS — I959 Hypotension, unspecified: Secondary | ICD-10-CM | POA: Diagnosis present

## 2015-01-21 DIAGNOSIS — K521 Toxic gastroenteritis and colitis: Secondary | ICD-10-CM | POA: Diagnosis present

## 2015-01-21 DIAGNOSIS — R4182 Altered mental status, unspecified: Secondary | ICD-10-CM | POA: Diagnosis present

## 2015-01-21 DIAGNOSIS — Z6834 Body mass index (BMI) 34.0-34.9, adult: Secondary | ICD-10-CM | POA: Diagnosis not present

## 2015-01-21 DIAGNOSIS — N179 Acute kidney failure, unspecified: Secondary | ICD-10-CM | POA: Diagnosis present

## 2015-01-21 DIAGNOSIS — R6520 Severe sepsis without septic shock: Secondary | ICD-10-CM | POA: Diagnosis present

## 2015-01-21 DIAGNOSIS — J9601 Acute respiratory failure with hypoxia: Secondary | ICD-10-CM | POA: Diagnosis not present

## 2015-01-21 DIAGNOSIS — R197 Diarrhea, unspecified: Secondary | ICD-10-CM | POA: Diagnosis present

## 2015-01-21 DIAGNOSIS — J9690 Respiratory failure, unspecified, unspecified whether with hypoxia or hypercapnia: Secondary | ICD-10-CM

## 2015-01-21 HISTORY — DX: Tremor, unspecified: R25.1

## 2015-01-21 LAB — CULTURE, BLOOD (ROUTINE X 2)
CULTURE: NO GROWTH
CULTURE: NO GROWTH

## 2015-01-21 LAB — BLOOD GAS, ARTERIAL
Acid-Base Excess: 3.5 mmol/L — ABNORMAL HIGH (ref 0.0–2.0)
Bicarbonate: 28.7 mEq/L — ABNORMAL HIGH (ref 20.0–24.0)
Drawn by: 24513
FIO2: 60 %
LHR: 16 {breaths}/min
MECHVT: 400 mL
O2 Saturation: 98.9 %
PATIENT TEMPERATURE: 98.6
PEEP/CPAP: 5 cmH2O
PH ART: 7.346 — AB (ref 7.350–7.450)
TCO2: 30.3 mmol/L (ref 0–100)
pCO2 arterial: 53.9 mmHg — ABNORMAL HIGH (ref 35.0–45.0)
pO2, Arterial: 234 mmHg — ABNORMAL HIGH (ref 80.0–100.0)

## 2015-01-21 LAB — URINALYSIS, ROUTINE W REFLEX MICROSCOPIC
Bilirubin Urine: NEGATIVE
Glucose, UA: NEGATIVE mg/dL
Ketones, ur: NEGATIVE mg/dL
Nitrite: NEGATIVE
PROTEIN: NEGATIVE mg/dL
Specific Gravity, Urine: 1.009 (ref 1.005–1.030)
UROBILINOGEN UA: 0.2 mg/dL (ref 0.0–1.0)
pH: 6.5 (ref 5.0–8.0)

## 2015-01-21 LAB — GLUCOSE, CAPILLARY: GLUCOSE-CAPILLARY: 208 mg/dL — AB (ref 65–99)

## 2015-01-21 LAB — CBC WITH DIFFERENTIAL/PLATELET
Basophils Absolute: 0.1 10*3/uL (ref 0.0–0.1)
Basophils Relative: 1 % (ref 0–1)
Eosinophils Absolute: 0.1 10*3/uL (ref 0.0–0.7)
Eosinophils Relative: 1 % (ref 0–5)
HCT: 41.2 % (ref 36.0–46.0)
Hemoglobin: 13.3 g/dL (ref 12.0–15.0)
LYMPHS ABS: 1.4 10*3/uL (ref 0.7–4.0)
LYMPHS PCT: 12 % (ref 12–46)
MCH: 28.8 pg (ref 26.0–34.0)
MCHC: 32.3 g/dL (ref 30.0–36.0)
MCV: 89.2 fL (ref 78.0–100.0)
Monocytes Absolute: 0.4 10*3/uL (ref 0.1–1.0)
Monocytes Relative: 3 % (ref 3–12)
Neutro Abs: 9.8 10*3/uL — ABNORMAL HIGH (ref 1.7–7.7)
Neutrophils Relative %: 83 % — ABNORMAL HIGH (ref 43–77)
Platelets: 166 10*3/uL (ref 150–400)
RBC: 4.62 MIL/uL (ref 3.87–5.11)
RDW: 15.5 % (ref 11.5–15.5)
WBC: 11.8 10*3/uL — AB (ref 4.0–10.5)

## 2015-01-21 LAB — I-STAT ARTERIAL BLOOD GAS, ED
Acid-Base Excess: 6 mmol/L — ABNORMAL HIGH (ref 0.0–2.0)
BICARBONATE: 34 meq/L — AB (ref 20.0–24.0)
O2 Saturation: 100 %
PCO2 ART: 69 mmHg — AB (ref 35.0–45.0)
PH ART: 7.306 — AB (ref 7.350–7.450)
PO2 ART: 252 mmHg — AB (ref 80.0–100.0)
Patient temperature: 100.6
TCO2: 36 mmol/L (ref 0–100)

## 2015-01-21 LAB — LIPASE, BLOOD: LIPASE: 31 U/L (ref 22–51)

## 2015-01-21 LAB — COMPREHENSIVE METABOLIC PANEL
ALBUMIN: 3.4 g/dL — AB (ref 3.5–5.0)
ALT: 31 U/L (ref 14–54)
ANION GAP: 12 (ref 5–15)
AST: 27 U/L (ref 15–41)
Alkaline Phosphatase: 51 U/L (ref 38–126)
BILIRUBIN TOTAL: 0.6 mg/dL (ref 0.3–1.2)
BUN: 24 mg/dL — ABNORMAL HIGH (ref 6–20)
CHLORIDE: 104 mmol/L (ref 101–111)
CO2: 28 mmol/L (ref 22–32)
Calcium: 8.7 mg/dL — ABNORMAL LOW (ref 8.9–10.3)
Creatinine, Ser: 1.4 mg/dL — ABNORMAL HIGH (ref 0.44–1.00)
GFR calc Af Amer: 43 mL/min — ABNORMAL LOW (ref >60)
GFR calc non Af Amer: 37 mL/min — ABNORMAL LOW (ref >60)
Glucose, Bld: 214 mg/dL — ABNORMAL HIGH (ref 65–99)
POTASSIUM: 4.4 mmol/L (ref 3.5–5.1)
SODIUM: 144 mmol/L (ref 135–145)
Total Protein: 6 g/dL — ABNORMAL LOW (ref 6.5–8.1)

## 2015-01-21 LAB — URINE MICROSCOPIC-ADD ON

## 2015-01-21 LAB — I-STAT CG4 LACTIC ACID, ED
Lactic Acid, Venous: 4.16 mmol/L (ref 0.5–2.0)
Lactic Acid, Venous: 4.33 mmol/L (ref 0.5–2.0)

## 2015-01-21 LAB — PROCALCITONIN: Procalcitonin: <0.1 ng/mL

## 2015-01-21 LAB — LACTIC ACID, PLASMA
Lactic Acid, Venous: 4.4 mmol/L (ref 0.5–2.0)
Lactic Acid, Venous: 4.4 mmol/L (ref 0.5–2.0)

## 2015-01-21 LAB — PROTIME-INR
INR: 1.07 (ref 0.00–1.49)
Prothrombin Time: 14.1 seconds (ref 11.6–15.2)

## 2015-01-21 LAB — I-STAT TROPONIN, ED: Troponin i, poc: 0 ng/mL (ref 0.00–0.08)

## 2015-01-21 LAB — BRAIN NATRIURETIC PEPTIDE: B NATRIURETIC PEPTIDE 5: 99.7 pg/mL (ref 0.0–100.0)

## 2015-01-21 MED ORDER — FENTANYL CITRATE (PF) 100 MCG/2ML IJ SOLN
50.0000 ug | INTRAMUSCULAR | Status: DC | PRN
  Administered 2015-01-21 – 2015-01-22 (×8): 100 ug via INTRAVENOUS
  Filled 2015-01-21 (×8): qty 2

## 2015-01-21 MED ORDER — VITAL HIGH PROTEIN PO LIQD
1000.0000 mL | ORAL | Status: DC
  Administered 2015-01-21 – 2015-01-22 (×2)
  Filled 2015-01-21 (×3): qty 1000

## 2015-01-21 MED ORDER — MIDAZOLAM HCL 2 MG/2ML IJ SOLN
2.0000 mg | INTRAMUSCULAR | Status: DC | PRN
  Administered 2015-01-22 (×4): 2 mg via INTRAVENOUS
  Filled 2015-01-21 (×4): qty 2

## 2015-01-21 MED ORDER — PIPERACILLIN-TAZOBACTAM 3.375 G IVPB 30 MIN
3.3750 g | Freq: Once | INTRAVENOUS | Status: AC
  Administered 2015-01-21: 3.375 g via INTRAVENOUS
  Filled 2015-01-21: qty 50

## 2015-01-21 MED ORDER — ETOMIDATE 2 MG/ML IV SOLN
INTRAVENOUS | Status: AC
  Administered 2015-01-21: 20 mg
  Filled 2015-01-21: qty 20

## 2015-01-21 MED ORDER — INSULIN ASPART 100 UNIT/ML ~~LOC~~ SOLN
0.0000 [IU] | SUBCUTANEOUS | Status: DC
  Administered 2015-01-21: 5 [IU] via SUBCUTANEOUS
  Administered 2015-01-22 (×5): 3 [IU] via SUBCUTANEOUS
  Administered 2015-01-23: 2 [IU] via SUBCUTANEOUS
  Administered 2015-01-23: 3 [IU] via SUBCUTANEOUS
  Administered 2015-01-23: 2 [IU] via SUBCUTANEOUS
  Administered 2015-01-23 (×2): 3 [IU] via SUBCUTANEOUS
  Administered 2015-01-24: 2 [IU] via SUBCUTANEOUS
  Administered 2015-01-24 (×2): 5 [IU] via SUBCUTANEOUS
  Administered 2015-01-24 (×2): 2 [IU] via SUBCUTANEOUS
  Administered 2015-01-24: 5 [IU] via SUBCUTANEOUS
  Administered 2015-01-25: 3 [IU] via SUBCUTANEOUS
  Administered 2015-01-25: 5 [IU] via SUBCUTANEOUS
  Administered 2015-01-25: 3 [IU] via SUBCUTANEOUS
  Administered 2015-01-25: 2 [IU] via SUBCUTANEOUS
  Administered 2015-01-25 (×2): 3 [IU] via SUBCUTANEOUS
  Administered 2015-01-25: 2 [IU] via SUBCUTANEOUS
  Administered 2015-01-26: 5 [IU] via SUBCUTANEOUS
  Administered 2015-01-26: 3 [IU] via SUBCUTANEOUS
  Administered 2015-01-26: 5 [IU] via SUBCUTANEOUS
  Administered 2015-01-27 (×3): 3 [IU] via SUBCUTANEOUS
  Administered 2015-01-28 (×2): 2 [IU] via SUBCUTANEOUS

## 2015-01-21 MED ORDER — HEPARIN SODIUM (PORCINE) 5000 UNIT/ML IJ SOLN
5000.0000 [IU] | Freq: Three times a day (TID) | INTRAMUSCULAR | Status: DC
  Administered 2015-01-21 – 2015-01-29 (×23): 5000 [IU] via SUBCUTANEOUS
  Filled 2015-01-21 (×28): qty 1

## 2015-01-21 MED ORDER — VANCOMYCIN 50 MG/ML ORAL SOLUTION
125.0000 mg | Freq: Four times a day (QID) | ORAL | Status: DC
  Administered 2015-01-21 – 2015-01-22 (×2): 125 mg
  Filled 2015-01-21 (×6): qty 2.5

## 2015-01-21 MED ORDER — LORAZEPAM 2 MG/ML IJ SOLN
INTRAMUSCULAR | Status: AC
  Administered 2015-01-21: 2 mg
  Filled 2015-01-21: qty 1

## 2015-01-21 MED ORDER — VANCOMYCIN HCL IN DEXTROSE 1-5 GM/200ML-% IV SOLN
1000.0000 mg | Freq: Once | INTRAVENOUS | Status: AC
  Administered 2015-01-21: 1000 mg via INTRAVENOUS
  Filled 2015-01-21: qty 200

## 2015-01-21 MED ORDER — FENTANYL CITRATE (PF) 100 MCG/2ML IJ SOLN
INTRAMUSCULAR | Status: AC
  Filled 2015-01-21: qty 2

## 2015-01-21 MED ORDER — SUCCINYLCHOLINE CHLORIDE 20 MG/ML IJ SOLN
INTRAMUSCULAR | Status: AC
  Administered 2015-01-21: 100 mg
  Filled 2015-01-21: qty 1

## 2015-01-21 MED ORDER — EZETIMIBE 10 MG PO TABS
10.0000 mg | ORAL_TABLET | Freq: Every morning | ORAL | Status: DC
  Filled 2015-01-21: qty 1

## 2015-01-21 MED ORDER — SODIUM CHLORIDE 0.9 % IV SOLN
INTRAVENOUS | Status: DC
  Administered 2015-01-21: 20:00:00 via INTRAVENOUS

## 2015-01-21 MED ORDER — CHLORHEXIDINE GLUCONATE 0.12 % MT SOLN
15.0000 mL | Freq: Two times a day (BID) | OROMUCOSAL | Status: DC
  Administered 2015-01-21 – 2015-01-29 (×16): 15 mL via OROMUCOSAL
  Filled 2015-01-21 (×20): qty 15

## 2015-01-21 MED ORDER — FAMOTIDINE 40 MG/5ML PO SUSR
20.0000 mg | Freq: Two times a day (BID) | ORAL | Status: DC
  Administered 2015-01-21: 20 mg
  Filled 2015-01-21 (×3): qty 2.5

## 2015-01-21 MED ORDER — FOLIC ACID 1 MG PO TABS
1.0000 mg | ORAL_TABLET | Freq: Every day | ORAL | Status: DC
  Filled 2015-01-21: qty 1

## 2015-01-21 MED ORDER — ASPIRIN EC 81 MG PO TBEC
81.0000 mg | DELAYED_RELEASE_TABLET | Freq: Every morning | ORAL | Status: DC
  Filled 2015-01-21: qty 1

## 2015-01-21 MED ORDER — FENTANYL CITRATE (PF) 100 MCG/2ML IJ SOLN: 50.0000 ug | INTRAMUSCULAR | Status: DC | PRN

## 2015-01-21 MED ORDER — SODIUM CHLORIDE 0.9 % IV BOLUS (SEPSIS)
1000.0000 mL | INTRAVENOUS | Status: AC
  Administered 2015-01-21: 1000 mL via INTRAVENOUS

## 2015-01-21 MED ORDER — LIDOCAINE HCL (CARDIAC) 20 MG/ML IV SOLN
INTRAVENOUS | Status: AC
Start: 2015-01-21 — End: 2015-01-22
  Filled 2015-01-21: qty 5

## 2015-01-21 MED ORDER — FENTANYL CITRATE (PF) 100 MCG/2ML IJ SOLN
50.0000 ug | INTRAMUSCULAR | Status: DC | PRN
  Administered 2015-01-21: 50 ug via INTRAVENOUS

## 2015-01-21 MED ORDER — BACLOFEN 10 MG PO TABS
10.0000 mg | ORAL_TABLET | Freq: Three times a day (TID) | ORAL | Status: DC
  Administered 2015-01-21 – 2015-01-29 (×22): 10 mg via ORAL
  Filled 2015-01-21 (×30): qty 1

## 2015-01-21 MED ORDER — ROCURONIUM BROMIDE 50 MG/5ML IV SOLN
INTRAVENOUS | Status: AC
  Filled 2015-01-21: qty 2

## 2015-01-21 MED ORDER — SODIUM CHLORIDE 0.9 % IV BOLUS (SEPSIS)
500.0000 mL | INTRAVENOUS | Status: AC
  Administered 2015-01-21: 500 mL via INTRAVENOUS

## 2015-01-21 MED ORDER — PIPERACILLIN-TAZOBACTAM 3.375 G IVPB
3.3750 g | Freq: Three times a day (TID) | INTRAVENOUS | Status: DC
  Administered 2015-01-21 – 2015-01-28 (×21): 3.375 g via INTRAVENOUS
  Filled 2015-01-21 (×25): qty 50

## 2015-01-21 MED ORDER — VANCOMYCIN HCL IN DEXTROSE 1-5 GM/200ML-% IV SOLN: 1000.0000 mg | Freq: Two times a day (BID) | INTRAVENOUS | Status: DC

## 2015-01-21 MED ORDER — HYDROCORTISONE NA SUCCINATE PF 100 MG IJ SOLR
50.0000 mg | Freq: Four times a day (QID) | INTRAMUSCULAR | Status: DC
  Administered 2015-01-21 – 2015-01-22 (×3): 50 mg via INTRAVENOUS
  Filled 2015-01-21: qty 1
  Filled 2015-01-21 (×2): qty 2
  Filled 2015-01-21 (×3): qty 1
  Filled 2015-01-21: qty 2

## 2015-01-21 MED ORDER — ACETAMINOPHEN 650 MG RE SUPP
650.0000 mg | RECTAL | Status: DC | PRN
  Filled 2015-01-21: qty 1

## 2015-01-21 MED ORDER — MIDAZOLAM HCL 2 MG/2ML IJ SOLN
INTRAMUSCULAR | Status: AC
  Filled 2015-01-21: qty 2

## 2015-01-21 MED ORDER — FENTANYL CITRATE (PF) 100 MCG/2ML IJ SOLN
50.0000 ug | Freq: Once | INTRAMUSCULAR | Status: AC
  Administered 2015-01-21: 50 ug via INTRAVENOUS

## 2015-01-21 MED ORDER — HYDROCORTISONE NA SUCCINATE PF 100 MG IJ SOLR: 50.0000 mg | Freq: Four times a day (QID) | INTRAMUSCULAR | Status: DC

## 2015-01-21 MED ORDER — HEPARIN SODIUM (PORCINE) 5000 UNIT/ML IJ SOLN: 5000.0000 [IU] | Freq: Three times a day (TID) | INTRAMUSCULAR | Status: DC

## 2015-01-21 MED ORDER — CETYLPYRIDINIUM CHLORIDE 0.05 % MT LIQD
7.0000 mL | Freq: Four times a day (QID) | OROMUCOSAL | Status: DC
  Administered 2015-01-22 – 2015-01-25 (×12): 7 mL via OROMUCOSAL

## 2015-01-21 NOTE — Progress Notes (Signed)
ANTIBIOTIC CONSULT NOTE - INITIAL  Pharmacy Consult for Vancomycin and Zosn Indication:   Allergies  Allergen Reactions  . Decadrol [Dexamethasone] Swelling    Swelling of face and stomach   . Decadron [Dexamethasone] Swelling  . Sodium Phosphate Swelling    Swelling of face and stomach     Patient Measurements: Height: 5\' 2"  (157.5 cm) (from 01/16/15 record) Weight: 175 lb 7.8 oz (79.601 kg) (from 01/19/15 at 0506) IBW/kg (Calculated) : 50.1 A  Vital Signs:   Intake/Output from previous day:   Intake/Output from this shift:    Labs:  Recent Labs  01/19/15 0513  WBC 16.6*  HGB 15.3*  PLT 279  CREATININE 0.75   Estimated Creatinine Clearance: 63.9 mL/min (by C-G formula based on Cr of 0.75). No results for input(s): VANCOTROUGH, VANCOPEAK, VANCORANDOM, GENTTROUGH, GENTPEAK, GENTRANDOM, TOBRATROUGH, TOBRAPEAK, TOBRARND, AMIKACINPEAK, AMIKACINTROU, AMIKACIN in the last 72 hours.   Microbiology: Recent Results (from the past 720 hour(s))  Blood culture (routine x 2)     Status: None   Collection Time: 01/16/15 10:10 AM  Result Value Ref Range Status   Specimen Description BLOOD RIGHT HAND  Final   Special Requests BOTTLES DRAWN AEROBIC AND ANAEROBIC 5ML  Final   Culture   Final    NO GROWTH 5 DAYS Performed at [facility]    Report Status 01/21/2015 FINAL  Final  Blood culture (routine x 2)     Status: None   Collection Time: 01/16/15 10:39 AM  Result Value Ref Range Status   Specimen Description BLOOD LEFT ANTECUBITAL  Final   Special Requests BOTTLES DRAWN AEROBIC AND ANAEROBIC 5CC  Final   Culture   Final    NO GROWTH 5 DAYS Performed at [facility]    Report Status 01/21/2015 FINAL  Final  Urine culture     Status: None   Collection Time: 01/16/15 11:33 AM  Result Value Ref Range Status   Specimen Description URINE, RANDOM  Final   Special Requests NONE  Final   Culture   Final    >=100,000 COLONIES/mL ESCHERICHIA COLI Confirmed  Extended Spectrum Beta-Lactamase Producer (ESBL) Performed at [facility]     Report Status 01/18/2015 FINAL  Final   Organism ID, Bacteria ESCHERICHIA COLI  Final      Susceptibility   Escherichia coli - MIC*    AMPICILLIN >=32 RESISTANT Resistant     CEFAZOLIN >=64 RESISTANT Resistant     CEFTRIAXONE >=64 RESISTANT Resistant     CIPROFLOXACIN >=4 RESISTANT Resistant     GENTAMICIN <=1 SENSITIVE Sensitive     IMIPENEM <=0.25 SENSITIVE Sensitive     NITROFURANTOIN <=16 SENSITIVE Sensitive     TRIMETH/SULFA <=20 SENSITIVE Sensitive     AMPICILLIN/SULBACTAM >=32 RESISTANT Resistant     PIP/TAZO <=4 SENSITIVE Sensitive     * >=100,000 COLONIES/mL ESCHERICHIA COLI  MRSA PCR Screening     Status: Abnormal   Collection Time: 01/16/15  3:33 PM  Result Value Ref Range Status   MRSA by PCR INVALID RESULTS, SPECIMEN SENT FOR CULTURE (A) NEGATIVE Final    Comment: RESULT CALLED TO, READ BACK BY AND VERIFIED WITH: JESSICA DEUTSCHIA,RN [id number] @ 1710 BY J SCOTTON        The GeneXpert MRSA Assay (FDA approved for NASAL specimens only), is one component of a comprehensive MRSA colonization surveillance program. It is not intended to diagnose MRSA infection nor to guide or monitor treatment for MRSA infections.   MRSA culture  Status: None   Collection Time: 01/16/15  3:33 PM  Result Value Ref Range Status   Specimen Description NOSE  Final   Special Requests NONE  Final   Culture NOMRSA Performed at [facility]   Final   Report Status 01/18/2015 FINAL  Final  Clostridium Difficile by PCR (not at [facility])     Status: None   Collection Time: 01/17/15  9:30 AM  Result Value Ref Range Status   C difficile by pcr NEGATIVE NEGATIVE Final    Medical History: Past Medical History  Diagnosis Date  . GERD (gastroesophageal reflux disease)   . Quadriplegia   . Hypertension   . Pressure ulcer of foot   . Chronic pain disorder   . Hyperlipidemia     Assessment: 69 y.o  female with PMH of paraplegia with recent hospitalization for uropsepsis, presents to ED 7/17 with fever and sepsis. Recently discharged on 7/15 (admission 7/12-7/15/16) on Bactrim DS 1 tablet po q12hr.  She presents today 7/17 to ED with mental status changes.  Patient found her unresponsive, had brief CPR by family until EMS arrived, determind to have pulses.  WBC = 16.6, SCr =- 0.75, estimated CrCl ~ 63 ml/min  Goal of Therapy:  Vancomycin trough level 15-20 mcg/ml  Plan:  Zosyn 3.375 gm IV q8hr (infuse over 4hr) Vancomycin 1000 mg IV q12h Monitor clinical status, renal function, culture results daily.  Vancomycin steady state trough if indicated.  Thank you for allowing pharmacy to be part of this patients care team. Noah Delaine, RPh Clinical Pharmacist Pager: [phone] 01/21/2015,4:27 PM

## 2015-01-21 NOTE — Progress Notes (Signed)
ABG drawn and sent to lab d/t results outside range for istat.  ABG lab called- states sample problem.  ABG will need to be redrawn.   Per Dr Craige CottaSood, okay for ABG to be done in ICU if unable to obtain before transport to room.  RN, and Oncoming RT given report and aware.
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
## 2015-01-21 NOTE — Progress Notes (Signed)
   01/21/15 1700  Clinical Encounter Type  Visited With Family  Visit Type Initial  Spiritual Encounters  Spiritual Needs Emotional  CH asked to escort PT family for consult with MD; Family informed of medical status; CH available as needed for emotional support.

## 2015-01-21 NOTE — ED Provider Notes (Signed)
CSN: [id number]     Arrival date & time 01/21/15  1556 History   First MD Initiated Contact with Patient 01/21/15 1609     No chief complaint on file.  (Consider location/radiation/quality/duration/timing/severity/associated sxs/prior Treatment) Patient is a 69 y.o. female presenting with altered mental status. The history is provided by the EMS personnel and medical records. No language interpreter was used.  Altered Mental Status Presenting symptoms: confusion, lethargy and unresponsiveness   Severity:  Severe Most recent episode:  Today Episode history:  Single Timing:  Constant Chronicity:  New Context: recent infection   Associated symptoms: difficulty breathing     Past Medical History  Diagnosis Date  . GERD (gastroesophageal reflux disease)   . Quadriplegia   . Hypertension   . Pressure ulcer of foot   . Chronic pain disorder   . Hyperlipidemia    Past Surgical History  Procedure Laterality Date  . Back surgery  08/2013  . Abdominal hysterectomy     Family History  Problem Relation Age of Onset  . Adopted: Yes   History  Substance Use Topics  . Smoking status: Former Smoker -- 0.25 packs/day    Types: Cigarettes  . Smokeless tobacco: Never Used  . Alcohol Use: No   OB History    No data available     Review of Systems  Unable to perform ROS: Mental status change  Respiratory: Positive for shortness of breath.   Psychiatric/Behavioral: Positive for confusion.      Allergies  Decadrol; Decadron; and Sodium phosphate  Home Medications   Prior to Admission medications   Medication Sig Start Date End Date Taking? Authorizing Provider  aspirin EC 81 MG tablet Take 81 mg by mouth every morning.     Historical Provider, MD  baclofen (LIORESAL) 10 MG tablet Take 10 mg by mouth 3 (three) times daily.    Historical Provider, MD  calcium-vitamin D (OSCAL WITH D) 500-200 MG-UNIT per tablet Take 1 tablet by mouth daily with breakfast.    Historical Provider,  MD  Chlorpheniramine-PSE-Ibuprofen (ADVIL ALLERGY SINUS) 2-30-200 MG TABS Take 1 tablet by mouth daily as needed (for allergies).    Historical Provider, MD  ezetimibe (ZETIA) 10 MG tablet Take 10 mg by mouth every morning.     Historical Provider, MD  feeding supplement, ENSURE, (ENSURE) PUDG Take 1 Container by mouth 2 (two) times daily between meals. Patient not taking: Reported on 01/16/2015 03/17/14   Theda Belfast Dhungel, MD  folic acid (FOLVITE) 1 MG tablet Take 1 mg by mouth daily.    Historical Provider, MD  gabapentin (NEURONTIN) 300 MG capsule Take 300 mg by mouth 3 (three) times daily.    Historical Provider, MD  Ibuprofen-Diphenhydramine Cit (ADVIL PM) 200-38 MG TABS Take 1-2 tablets by mouth at bedtime as needed (for pain and sleep).    Historical Provider, MD  LORazepam (ATIVAN) 1 MG tablet Take 0.5 tablets (0.5 mg total) by mouth every 12 (twelve) hours as needed for anxiety. Patient taking differently: Take 1 mg by mouth 3 (three) times daily.  04/18/14   Meredeth Ide, MD  magnesium oxide (MAG-OX) 400 MG tablet Take 400 mg by mouth 2 (two) times daily.    Historical Provider, MD  methotrexate (RHEUMATREX) 2.5 MG tablet Take 7.5 mg by mouth every Tuesday. Caution:Chemotherapy. Protect from light.    Historical Provider, MD  Multiple Vitamins-Minerals ([employer] SPECTRAVITE ADULT 50+ PO) Take 1 tablet by mouth daily.    Historical Provider, MD  omeprazole (PRILOSEC) 20  MG capsule Take 20 mg by mouth daily as needed (for acid reflex).     Historical Provider, MD  OxyCODONE (OXYCONTIN) 20 mg T12A 12 hr tablet Take 1 tablet (20 mg total) by mouth 2 (two) times daily. Patient taking differently: Take 40 mg by mouth 2 (two) times daily.  04/18/14   Meredeth Ide, MD  polyethylene glycol (MIRALAX / GLYCOLAX) packet Take 17 g by mouth daily as needed for mild constipation.    Historical Provider, MD  potassium chloride SA (K-DUR,KLOR-CON) 20 MEQ tablet Take 1 tablet (20 mEq total) by mouth 2 (two) times  daily. Patient taking differently: Take 20 mEq by mouth 3 (three) times daily.  04/18/14   Meredeth Ide, MD  predniSONE (DELTASONE) 5 MG tablet Take 15 mg by mouth daily with breakfast.    Historical Provider, MD  rOPINIRole (REQUIP) 0.5 MG tablet Take 0.5 mg by mouth 2 (two) times daily.    Historical Provider, MD  spironolactone (ALDACTONE) 25 MG tablet Take 1 tablet (25 mg total) by mouth daily. Patient not taking: Reported on 08/25/2014 04/18/14   Meredeth Ide, MD  sulfamethoxazole-trimethoprim (BACTRIM DS,SEPTRA DS) 800-160 MG per tablet Take 1 tablet by mouth every 12 (twelve) hours. 01/19/15   Maryruth Bun Rama, MD  tamsulosin (FLOMAX) 0.4 MG CAPS capsule Take 0.4 mg by mouth at bedtime.     Historical Provider, MD  temazepam (RESTORIL) 15 MG capsule Take 15 mg by mouth at bedtime.    Historical Provider, MD  torsemide (DEMADEX) 20 MG tablet Take 20 mg by mouth 2 (two) times daily.     Historical Provider, MD  trifluoperazine (STELAZINE) 5 MG tablet Take 5 mg by mouth 2 (two) times daily as needed (for itching).    Historical Provider, MD   ED Triage Vitals  Enc Vitals Group     BP 01/21/15 1630 155/74 mmHg     Pulse Rate 01/21/15 1630 117     Resp 01/21/15 1630 17     Temp 01/21/15 1630 100.4 F (38 C)     Temp src --      SpO2 01/21/15 1630 100 %     Weight 01/21/15 1600 175 lb 7.8 oz (79.601 kg)     Height 01/21/15 1600 5\' 2"  (1.575 m)     Head Cir --      Peak Flow --      Pain Score --      Pain Loc --      Pain Edu? --      Excl. in GC? --     Physical Exam  Constitutional: She appears well-developed and well-nourished. She appears lethargic. She appears toxic. She appears distressed. Face mask in place.  HENT:  Head: Normocephalic and atraumatic.  Nose: Nose normal.  Mouth/Throat: Oropharynx is clear and moist. No oropharyngeal exudate.  Eyes: EOM are normal. Pupils are equal, round, and reactive to light.  Neck: Normal range of motion. Neck supple.  Cardiovascular:  Regular rhythm, normal heart sounds and intact distal pulses.  Tachycardia present.   No murmur heard. Pulmonary/Chest: Effort normal. Tachypnea noted. She has decreased breath sounds. She has no wheezes. She has rhonchi. She exhibits no tenderness.  Abdominal: Soft. She exhibits distension. There is no tenderness. There is no rebound and no guarding.  Soft but distended abdomen.  No rigidity  Genitourinary:  Large soft brown malodorous stool  Musculoskeletal: Normal range of motion. She exhibits no tenderness.  Well healed surgical scars noted  on C and L spine, no surrounding cellulitis or signs of acute infection  Lymphadenopathy:    She has no cervical adenopathy.  Neurological: She appears lethargic. No cranial nerve deficit. She exhibits abnormal muscle tone. GCS eye subscore is 3. GCS verbal subscore is 1. GCS motor subscore is 5.  Minimal spontaneous movement of bilateral upper extremities.  No response to bilateral lower extremities.  Bilateral LEs with increased tone and contractures 2/2 nonuse.    Skin: Skin is warm and dry. She is not diaphoretic.  Nursing note and vitals reviewed.   ED Course  INTUBATION Date/Time: 01/21/2015 5:30 PM Performed by: Lenell AntuWRIGHT, Tou Hayner Authorized by: Gilda CreasePOLLINA, CHRISTOPHER J Consent: The procedure was performed in an emergent situation. Verbal consent obtained. Risks and benefits: risks, benefits and alternatives were discussed Consent given by: Son. Required items: required blood products, implants, devices, and special equipment available Patient identity confirmed: anonymous protocol, patient vented/unresponsive Time out: Immediately prior to procedure a "time out" was called to verify the correct patient, procedure, equipment, support staff and site/side marked as required. Indications: airway protection and  hypercapnia Intubation method: video-assisted Patient status: paralyzed (RSI) Preoxygenation: nonrebreather mask Sedatives:  etomidate Paralytic: succinylcholine Laryngoscope size: glidescope size 3 blade. Tube size: 7.0 mm Tube type: cuffed Number of attempts: 1 Cricoid pressure: no Cords visualized: yes Post-procedure assessment: chest rise and ETCO2 monitor Breath sounds: equal Cuff inflated: yes ETT to lip: 24 cm Tube secured with: ETT holder Chest x-ray interpreted by me. Chest x-ray findings: endotracheal tube too low Tube repositioned: tube repositioned successfully Patient tolerance: Patient tolerated the procedure well with no immediate complications Comments: Glidescope used 2/2 cervical fusion and significantly limited neck ROM.  Grade 1 view obtained and tube passed easily.     (including critical care time) Labs Review Labs Reviewed  CULTURE, BLOOD (ROUTINE X 2)  CULTURE, BLOOD (ROUTINE X 2)  URINE CULTURE  GRAM STAIN  CLOSTRIDIUM DIFFICILE BY PCR (NOT AT [facility])  COMPREHENSIVE METABOLIC PANEL  CBC WITH DIFFERENTIAL/PLATELET  URINALYSIS, ROUTINE W REFLEX MICROSCOPIC (NOT AT [facility])  LACTIC ACID, PLASMA  LACTIC ACID, PLASMA  LIPASE, BLOOD  BRAIN NATRIURETIC PEPTIDE  PROTIME-INR  I-STAT CG4 LACTIC ACID, ED  I-STAT TROPOININ, ED  I-STAT ARTERIAL BLOOD GAS, ED    Imaging Review Dg Chest Portable 1 View  01/21/2015   CLINICAL DATA:  Intubation, evaluate endotracheal tube position  EXAM: PORTABLE CHEST - 1 VIEW  COMPARISON:  Portable exam 1748 hours compared to 01/21/2015 at 1628 hours  FINDINGS: Tip of endotracheal tube projects over proximal RIGHT mainstem bronchus recommend withdrawal 3-4 cm.  Enlargement of cardiac silhouette.  Mediastinal contours and pulmonary vascularity normal.  Lungs grossly clear.  No pleural effusion or pneumothorax.  IMPRESSION: Tip of endotracheal tube projects over proximal RIGHT mainstem bronchus; recommend withdrawal 3-4 cm.  Findings called to Evangeline [facility]ulaKaren RN [facility] on 01/21/2015 at 1808 hours.   Electronically Signed   By: Ulyses SouthwardMark  Boles M.D.   On: 01/21/2015 18:08    Dg Chest Port 1 View  01/21/2015   CLINICAL DATA:  Sepsis  EXAM: PORTABLE CHEST - 1 VIEW  COMPARISON:  01/16/2015  FINDINGS: Lungs are clear.  No pleural effusion or pneumothorax.  Cardiomegaly.  IMPRESSION: No evidence of acute cardiopulmonary disease.   Electronically Signed   By: Charline BillsSriyesh  Krishnan M.D.   On: 01/21/2015 16:49     EKG Interpretation None      MDM   Final diagnoses:  Respiratory failure  Urosepsis Altered Mental  Status, GCS < 9   Pt is a 69 yo F with hx of paraplegia due to fall and recent admission for ESBL E coli urosepsis who presents with altered mental status.  EMS reported possible recent spinal surgery, but OSH Epic review shows cervical ACDF in Feb 2015 and lumbar discectomies prior to then (MRI 08/12/13 with evidence of surgical changes).  Recent urosepsis, due to ESBL E coli earlier this week.  Initially treated with rocephin then changed to bactrim.  Pharmacy reports pt has filled her Rx for bactrim after discharge, so assumed to be taking the meds.    Found minimally responsive at home.  Bystander CPR for ~ 2-3 minutes then stopped by fire department when found to have good pulses on first responder arrival.  Initial GCS 5 on EMS arrival, given 2 x doses of narcan and GCS improved to 8.  Maintaining good O2 sats on O2 by nonrebreather, did not tolerate an OPA placement.    On arrival to the ED, she had GCS 9 (3-1-5), no response to painful stimuli of lower extremities, presumed to be baseline but will withdrawal to upper extremities.  Minimal spontaneous movement of bilateral upper extremities.  Large soft brown stool in diaper, smells concerning for possible C. Diff.  Distended but soft abdomen and does not grimace to palpation.  Well healed surgical scars on C and L spine, no evidence of skin infection.   Found to be febrile to 100.8 so code sepsis was called.  Blood and urine cultures were obtained, she was started on 30 cc/kg of IVF, and she was covered with  vanc and zosyn.   Recent urine micro shows that her infection was susceptible to zosyn.   Initial ABG: 7.3/69/252/34  Spoke with son about goals of care and he reports she is a full code.  At home has 24 hour care due to mobility issues but is usually alert and oriented and mentates well.  Is paraplegic due to spinal cord injury but previously was very highly functioning.   GCS now dropped to 6 (1-1-4) and ABG shows hypercarbia. Intubated at 1730 for airway protection as she was no longer responsive and with sonorous breathing.  RSI'd with etomidate 20 and succinylcholine 100, using a glidescope 3 blade with a 7.0 ETT placed at 24 at the lip.  Bilateral breath sounds, good color change, and good tube fogging noted.   CXR shows deep right mainstemmed ETT.  On re-evaluation, the tube appears to have been pushed in to 26 cm, likely when pt was being positioned for CXR, so this was retracted again back to initial level of 24 cm at the teeth.   Labs show lactate 4, Trop 0, WBC 11.  Urine with large leuks.  CXR without signs of pneumonia.   Woke up some after intubation.  Given additional sedation with PRN fentanyl and versed.    Admitted to critical care team.  Spoke to Dr. Arsenio Loader with [facility], agreeable to ICU admission.    If performed, labs, EKGs, and imaging were reviewed and interpreted by myself and my attending, and incorporated in the medical decision making.  Patient was seen with ED Attending, Dr. Nada Maclachlan, MD     Lenell Antu, MD 01/22/15 0031  Gilda Crease, MD 01/22/15 [phone]

## 2015-01-21 NOTE — H&P (Signed)
PULMONARY / CRITICAL CARE MEDICINE   Name: [name] [name] MRN: [id number] DOB: 07/22/1945    ADMISSION DATE:  01/21/2015  REFERRING MD :  ER  CHIEF COMPLAINT:  Altered mental status  INITIAL PRESENTATION:  69 yo female presented with altered mental status.  She was d/c on 7/15 after being tx for UTI.  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  Noted to have fever, elevated lactic acid, and diarrhea in ED.  Urine cx from 01/16/15 with ESBL E coli.  STUDIES:   SIGNIFICANT EVENTS: 7/17 Admit   HISTORY OF PRESENT ILLNESS:   Hx from pt's son and medical staff.  She has hx of quadriplegia after work accident in 1990's.  She is able to assist with rolling, but otherwise is dependent on others for ADL.  She was in hospital 7/12 to 7/15 with sepsis 2nd to UTI.  She was tx with rocephin and then changed to bactrim.  Her son notes that over the past 24 hours she was not feeling well.  She was having some nausea.  She then started getting more sleepy.  EMS was called.  She was given multiple doses of narcan with only partial improvement in her mental status.  In ER she was unresponsive, and concern for airway protection.  She was intubated.  She was noted to have elevated lactic acid, low grade temp, and hypotension.  She also had several episodes of loose stool in ER.  In review of records her urine cx from 01/16/15 showed ESBL E coli.  PAST MEDICAL HISTORY :   has a past medical history of GERD (gastroesophageal reflux disease); Quadriplegia; Hypertension; Pressure ulcer of foot; Chronic pain disorder; and Hyperlipidemia.    has past surgical history that includes Back surgery (08/2013) and Abdominal hysterectomy.   Prior to Admission medications   Medication Sig Start Date End Date Taking? Authorizing Provider  sulfamethoxazole-trimethoprim (BACTRIM DS,SEPTRA DS) 800-160 MG per tablet Take 1 tablet by mouth every 12 (twelve) hours. Patient taking differently: Take 1 tablet by mouth  every 12 (twelve) hours. 7 day course filled 01/19/15 at noon 01/19/15  Yes Maryruth Bun Rama, MD  aspirin EC 81 MG tablet Take 81 mg by mouth every morning.     Historical Provider, MD  baclofen (LIORESAL) 10 MG tablet Take 10 mg by mouth 3 (three) times daily.    Historical Provider, MD  calcium-vitamin D (OSCAL WITH D) 500-200 MG-UNIT per tablet Take 1 tablet by mouth daily with breakfast.    Historical Provider, MD  Chlorpheniramine-PSE-Ibuprofen (ADVIL ALLERGY SINUS) 2-30-200 MG TABS Take 1 tablet by mouth daily as needed (for allergies).    Historical Provider, MD  ezetimibe (ZETIA) 10 MG tablet Take 10 mg by mouth every morning.     Historical Provider, MD  feeding supplement, ENSURE, (ENSURE) PUDG Take 1 Container by mouth 2 (two) times daily between meals. Patient not taking: Reported on 01/16/2015 03/17/14   Theda Belfast Dhungel, MD  folic acid (FOLVITE) 1 MG tablet Take 1 mg by mouth daily.    Historical Provider, MD  gabapentin (NEURONTIN) 300 MG capsule Take 300 mg by mouth 3 (three) times daily.    Historical Provider, MD  Ibuprofen-Diphenhydramine Cit (ADVIL PM) 200-38 MG TABS Take 1-2 tablets by mouth at bedtime as needed (for pain and sleep).    Historical Provider, MD  LORazepam (ATIVAN) 1 MG tablet Take 0.5 tablets (0.5 mg total) by mouth every 12 (twelve) hours as needed for anxiety. Patient taking differently: Take  1 mg by mouth 3 (three) times daily.  04/18/14   Meredeth Ide, MD  magnesium oxide (MAG-OX) 400 MG tablet Take 400 mg by mouth 2 (two) times daily.    Historical Provider, MD  methotrexate (RHEUMATREX) 2.5 MG tablet Take 7.5 mg by mouth every Tuesday. Caution:Chemotherapy. Protect from light.    Historical Provider, MD  Multiple Vitamins-Minerals ([employer] SPECTRAVITE ADULT 50+ PO) Take 1 tablet by mouth daily.    Historical Provider, MD  omeprazole (PRILOSEC) 20 MG capsule Take 20 mg by mouth daily as needed (for acid reflex).     Historical Provider, MD  OxyCODONE (OXYCONTIN) 20  mg T12A 12 hr tablet Take 1 tablet (20 mg total) by mouth 2 (two) times daily. Patient taking differently: Take 40 mg by mouth 2 (two) times daily.  04/18/14   Meredeth Ide, MD  polyethylene glycol (MIRALAX / GLYCOLAX) packet Take 17 g by mouth daily as needed for mild constipation.    Historical Provider, MD  potassium chloride SA (K-DUR,KLOR-CON) 20 MEQ tablet Take 1 tablet (20 mEq total) by mouth 2 (two) times daily. Patient taking differently: Take 20 mEq by mouth 3 (three) times daily.  04/18/14   Meredeth Ide, MD  predniSONE (DELTASONE) 5 MG tablet Take 15 mg by mouth daily with breakfast.    Historical Provider, MD  rOPINIRole (REQUIP) 0.5 MG tablet Take 0.5 mg by mouth 2 (two) times daily.    Historical Provider, MD  spironolactone (ALDACTONE) 25 MG tablet Take 1 tablet (25 mg total) by mouth daily. Patient not taking: Reported on 08/25/2014 04/18/14   Meredeth Ide, MD  tamsulosin (FLOMAX) 0.4 MG CAPS capsule Take 0.4 mg by mouth at bedtime.     Historical Provider, MD  temazepam (RESTORIL) 15 MG capsule Take 15 mg by mouth at bedtime.    Historical Provider, MD  torsemide (DEMADEX) 20 MG tablet Take 20 mg by mouth 2 (two) times daily.     Historical Provider, MD  trifluoperazine (STELAZINE) 5 MG tablet Take 5 mg by mouth 2 (two) times daily as needed (for itching).    Historical Provider, MD   Allergies  Allergen Reactions  . Decadrol [Dexamethasone] Swelling    Swelling of face and stomach   . Decadron [Dexamethasone] Swelling  . Sodium Phosphate Swelling    Swelling of face and stomach     FAMILY HISTORY:  is adopted. SOCIAL HISTORY:  reports that she has quit smoking. Her smoking use included Cigarettes. She smoked 0.25 packs per day. She has never used smokeless tobacco. She reports that she does not drink alcohol or use illicit drugs.  REVIEW OF SYSTEMS:   Unable to obtain.  SUBJECTIVE:   VITAL SIGNS: FiO2 (%):  [100 %] 100 % (07/17 1725) Weight:  [175 lb 7.8 oz  (79.601 kg)] 175 lb 7.8 oz (79.601 kg) (07/17 1600) HEMODYNAMICS:   VENTILATOR SETTINGS: Vent Mode:  [-] PRVC FiO2 (%):  [100 %] 100 % Set Rate:  [16 bmp] 16 bmp Vt Set:  [400 mL] 400 mL PEEP:  [5 cmH20] 5 cmH20 Plateau Pressure:  [17 cmH20] 17 cmH20 INTAKE / OUTPUT:  Intake/Output Summary (Last 24 hours) at 01/21/15 1812 Last data filed at 01/21/15 1758  Gross per 24 hour  Intake    550 ml  Output      0 ml  Net    550 ml    PHYSICAL EXAMINATION: General: in ER Neuro:  Sedated HEENT:  Pupils reactive, ETT in  place Cardiovascular:  Regular, no murmur Lungs: no wheeze/rales Abdomen:  Soft, non tender Musculoskeletal:  No edema Skin:  No rashes  LABS:  CBC  Recent Labs Lab 01/18/15 0402 01/19/15 0513 01/21/15 1648  WBC 13.1* 16.6* 11.8*  HGB 14.0 15.3* 13.3  HCT 42.4 46.9* 41.2  PLT 215 279 166   Coag's  Recent Labs Lab 01/21/15 1648  INR 1.07   BMET  Recent Labs Lab 01/18/15 0402 01/19/15 0513 01/21/15 1648  NA 141 141 144  K 3.4* 3.1* 4.4  CL 98* 96* 104  CO2 30 31 28   BUN 11 13 24*  CREATININE 0.59 0.75 1.40*  GLUCOSE 155* 158* 214*   Electrolytes  Recent Labs Lab 01/17/15 0959 01/18/15 0402 01/19/15 0513 01/21/15 1648  CALCIUM 9.0 9.3 9.4 8.7*  MG 1.8  --   --   --   PHOS 3.4  --   --   --    Sepsis Markers  Recent Labs Lab 01/16/15 0949 01/16/15 1253 01/21/15 1705  LATICACIDVEN 3.6* 3.5* 4.16*   ABG  Recent Labs Lab 01/21/15 1650  PHART 7.306*  PCO2ART 69.0*  PO2ART 252.0*   Liver Enzymes  Recent Labs Lab 01/16/15 0949 01/21/15 1648  AST 27 27  ALT 29 31  ALKPHOS 61 51  BILITOT 1.0 0.6  ALBUMIN 3.9 3.4*   Cardiac Enzymes No results for input(s): TROPONINI, PROBNP in the last 168 hours.   Glucose  Recent Labs Lab 01/18/15 0744 01/18/15 1250 01/18/15 1850 01/18/15 2303 01/19/15 0725 01/19/15 1152  GLUCAP 162* 199* 216* 170* 173* 172*    Imaging Dg Chest Portable 1 View  01/21/2015   CLINICAL  DATA:  Intubation, evaluate endotracheal tube position  EXAM: PORTABLE CHEST - 1 VIEW  COMPARISON:  Portable exam 1748 hours compared to 01/21/2015 at 1628 hours  FINDINGS: Tip of endotracheal tube projects over proximal RIGHT mainstem bronchus recommend withdrawal 3-4 cm.  Enlargement of cardiac silhouette.  Mediastinal contours and pulmonary vascularity normal.  Lungs grossly clear.  No pleural effusion or pneumothorax.  IMPRESSION: Tip of endotracheal tube projects over proximal RIGHT mainstem bronchus; recommend withdrawal 3-4 cm.  Findings called to Evangeline Gula in [facility] ED on 01/21/2015 at 1808 hours.   Electronically Signed   By: Ulyses Southward M.D.   On: 01/21/2015 18:08   Dg Chest Port 1 View  01/21/2015   CLINICAL DATA:  Sepsis  EXAM: PORTABLE CHEST - 1 VIEW  COMPARISON:  01/16/2015  FINDINGS: Lungs are clear.  No pleural effusion or pneumothorax.  Cardiomegaly.  IMPRESSION: No evidence of acute cardiopulmonary disease.   Electronically Signed   By: Charline Bills M.D.   On: 01/21/2015 16:49     ASSESSMENT / PLAN:  PULMONARY ETT 7/17 >> A: Compromised airway in setting of sepsis and encephalopathy. P:   Full vent support F/u CXR, ABG  CARDIOVASCULAR A:  Severe sepsis. Hx of HTN, HLD. P:  Continue IV fluids F/u lactic acid If BP remains low or lactic acid not clearing, then needs CVL and pressors Continue ASA, zetia Hold aldactone, demadex  RENAL A:   AKI 2nd to sepsis >> baseline creatinine 0.75 from 01/19/15. P:   Foley Monitor renal fx, urine outpt Hold flomax  GASTROINTESTINAL A:   Hx of GERD. Nutrition. P:   Tube feeds while on vent Pepcid for SUP  HEMATOLOGIC A:   Leukocytosis. P:  F/u CBC SQ heparin for DVT prevention  INFECTIOUS A:   Severe sepsis >> likely sources  are UTI and/or C diff. P:   Day 1 zosyn Day 1 enteral vancomycin  Blood 7/17 >> Urine 7/17 >> C diff 7/17 >>  F/u procalcitonin  ENDOCRINE A:   Hyperglycemia. P:    SSI  NEUROLOGIC A:   Acute metabolic encephalopathy. Hx of quadriplegia. P:   RASS goal: -1 Continue baclofen Hold neurontin, ativan, oxycontin, requip, restoril, stelazine  DERMATOLGY A: Hx of skin rash >> seen by dermatology as outpt (Dr. Wynelle ClevelandJoseph Jorizzo) >> on MTX and prednisone as outpt. P: Hold MTX, prednisone Stress dose solu cortef  Updated pt's son at bedside.  CC time 40 minutes.  Coralyn HellingVineet Lamonica Trueba, MD [facility] 01/21/2015, 6:30 PM Pager:  [phone] After 3pm call: [phone]

## 2015-01-21 NOTE — ED Provider Notes (Signed)
Patient presented to the ER with mental status changes. Patient brought to the emergency department from home, family having found her unresponsive. Patient reportedly had brief CPR by family members until first responders arrived and determined that she had pulses. EMS administered Narcan for GCS of 5, report improvement. Patient still minimally responsive at arrival, opens eyes to voice, does not answer questions or follow commands.  Face to face Exam: HEENT - PERRLA Lungs - CTAB Heart - RRR, no M/R/G Abd - S/NT/ND Neuro - alert, oriented x3  Plan: Patient presents with fever and sepsis. Was hospitalized this week for UTI and sepsis. This is likely source, might require workup for other sources if urine is improved. Patient also incontinent of stool here in the ER, will require evaluation for C. difficile with recent antibiotic.  Gilda Creasehristopher J Megen Madewell, MD 01/21/15 1622

## 2015-01-21 NOTE — ED Notes (Signed)
Attempted to give report to 2100.

## 2015-01-21 NOTE — ED Notes (Signed)
[name] --- [phone]

## 2015-01-21 NOTE — ED Notes (Signed)
Pt to ED via [facility] with family stating that her LOC has decreased throughout the day, on arrival -- pt is not responsive to painful stimuli. Blowing respirations, incontinent of large amount of yellow-brown stool, semiformed, [facility].

## 2015-01-22 ENCOUNTER — Encounter (HOSPITAL_COMMUNITY): Payer: Self-pay

## 2015-01-22 ENCOUNTER — Inpatient Hospital Stay (HOSPITAL_COMMUNITY): Payer: Medicare Other

## 2015-01-22 LAB — PHOSPHORUS: Phosphorus: 2.8 mg/dL (ref 2.5–4.6)

## 2015-01-22 LAB — COMPREHENSIVE METABOLIC PANEL
ALK PHOS: 49 U/L (ref 38–126)
ALT: 30 U/L (ref 14–54)
ANION GAP: 12 (ref 5–15)
AST: 26 U/L (ref 15–41)
Albumin: 3.2 g/dL — ABNORMAL LOW (ref 3.5–5.0)
BILIRUBIN TOTAL: 0.5 mg/dL (ref 0.3–1.2)
BUN: 18 mg/dL (ref 6–20)
CO2: 29 mmol/L (ref 22–32)
Calcium: 8.6 mg/dL — ABNORMAL LOW (ref 8.9–10.3)
Chloride: 105 mmol/L (ref 101–111)
Creatinine, Ser: 0.89 mg/dL (ref 0.44–1.00)
GFR calc Af Amer: >60 mL/min (ref >60)
Glucose, Bld: 164 mg/dL — ABNORMAL HIGH (ref 65–99)
POTASSIUM: 4.1 mmol/L (ref 3.5–5.1)
Sodium: 146 mmol/L — ABNORMAL HIGH (ref 135–145)
Total Protein: 5.4 g/dL — ABNORMAL LOW (ref 6.5–8.1)

## 2015-01-22 LAB — GRAM STAIN: Gram Stain: NONE SEEN

## 2015-01-22 LAB — MRSA PCR SCREENING: MRSA BY PCR: NEGATIVE

## 2015-01-22 LAB — URINE CULTURE: Culture: NO GROWTH

## 2015-01-22 LAB — CBC
HEMATOCRIT: 38.8 % (ref 36.0–46.0)
HEMOGLOBIN: 12.3 g/dL (ref 12.0–15.0)
MCH: 28.1 pg (ref 26.0–34.0)
MCHC: 31.7 g/dL (ref 30.0–36.0)
MCV: 88.6 fL (ref 78.0–100.0)
Platelets: 210 10*3/uL (ref 150–400)
RBC: 4.38 MIL/uL (ref 3.87–5.11)
RDW: 15.2 % (ref 11.5–15.5)
WBC: 13.7 10*3/uL — AB (ref 4.0–10.5)

## 2015-01-22 LAB — GLUCOSE, CAPILLARY
GLUCOSE-CAPILLARY: 170 mg/dL — AB (ref 65–99)
GLUCOSE-CAPILLARY: 172 mg/dL — AB (ref 65–99)
GLUCOSE-CAPILLARY: 120 mg/dL — AB (ref 65–99)
Glucose-Capillary: 122 mg/dL — ABNORMAL HIGH (ref 65–99)
Glucose-Capillary: 158 mg/dL — ABNORMAL HIGH (ref 65–99)
Glucose-Capillary: 165 mg/dL — ABNORMAL HIGH (ref 65–99)
Glucose-Capillary: 177 mg/dL — ABNORMAL HIGH (ref 65–99)

## 2015-01-22 LAB — CLOSTRIDIUM DIFFICILE BY PCR: Toxigenic C. Difficile by PCR: NEGATIVE

## 2015-01-22 LAB — PROCALCITONIN: Procalcitonin: <0.1 ng/mL

## 2015-01-22 LAB — MAGNESIUM: Magnesium: 1.9 mg/dL (ref 1.7–2.4)

## 2015-01-22 MED ORDER — TAMSULOSIN HCL 0.4 MG PO CAPS
0.4000 mg | ORAL_CAPSULE | Freq: Every day | ORAL | Status: DC
  Administered 2015-01-22 – 2015-01-28 (×7): 0.4 mg via ORAL
  Filled 2015-01-22 (×9): qty 1

## 2015-01-22 MED ORDER — EZETIMIBE 10 MG PO TABS
10.0000 mg | ORAL_TABLET | Freq: Every morning | ORAL | Status: DC
  Administered 2015-01-22 – 2015-01-29 (×8): 10 mg via ORAL
  Filled 2015-01-22 (×10): qty 1

## 2015-01-22 MED ORDER — FOLIC ACID 1 MG PO TABS
1.0000 mg | ORAL_TABLET | Freq: Every day | ORAL | Status: DC
  Administered 2015-01-22 – 2015-01-29 (×8): 1 mg via ORAL
  Filled 2015-01-22 (×10): qty 1

## 2015-01-22 MED ORDER — FENTANYL CITRATE (PF) 100 MCG/2ML IJ SOLN
50.0000 ug | INTRAMUSCULAR | Status: AC | PRN
  Administered 2015-01-22 – 2015-01-28 (×3): 50 ug via INTRAVENOUS
  Filled 2015-01-22 (×3): qty 2

## 2015-01-22 MED ORDER — FAMOTIDINE 20 MG PO TABS
20.0000 mg | ORAL_TABLET | Freq: Two times a day (BID) | ORAL | Status: DC
  Administered 2015-01-22 – 2015-01-29 (×15): 20 mg via ORAL
  Filled 2015-01-22 (×19): qty 1

## 2015-01-22 MED ORDER — ACETAMINOPHEN 160 MG/5ML PO SOLN
650.0000 mg | Freq: Four times a day (QID) | ORAL | Status: DC | PRN
  Administered 2015-01-22: 650 mg via ORAL
  Filled 2015-01-22: qty 20.3

## 2015-01-22 MED ORDER — ASPIRIN EC 81 MG PO TBEC
81.0000 mg | DELAYED_RELEASE_TABLET | Freq: Every morning | ORAL | Status: DC
  Administered 2015-01-22 – 2015-01-29 (×8): 81 mg via ORAL
  Filled 2015-01-22 (×10): qty 1

## 2015-01-22 MED ORDER — ROPINIROLE HCL 0.5 MG PO TABS
0.5000 mg | ORAL_TABLET | Freq: Two times a day (BID) | ORAL | Status: DC
  Administered 2015-01-22 – 2015-01-29 (×15): 0.5 mg via ORAL
  Filled 2015-01-22 (×16): qty 1

## 2015-01-22 MED ORDER — GABAPENTIN 100 MG PO CAPS
200.0000 mg | ORAL_CAPSULE | Freq: Three times a day (TID) | ORAL | Status: DC
  Administered 2015-01-22 – 2015-01-29 (×22): 200 mg via ORAL
  Filled 2015-01-22 (×30): qty 2

## 2015-01-22 MED ORDER — LORAZEPAM 1 MG PO TABS
1.0000 mg | ORAL_TABLET | Freq: Three times a day (TID) | ORAL | Status: DC
  Administered 2015-01-22 – 2015-01-29 (×21): 1 mg via ORAL
  Filled 2015-01-22 (×21): qty 1

## 2015-01-22 MED ORDER — HYDROCORTISONE NA SUCCINATE PF 100 MG IJ SOLR
50.0000 mg | Freq: Two times a day (BID) | INTRAMUSCULAR | Status: DC
  Administered 2015-01-22 – 2015-01-23 (×2): 50 mg via INTRAVENOUS
  Filled 2015-01-22 (×2): qty 1

## 2015-01-22 MED ORDER — HYDRALAZINE HCL 20 MG/ML IJ SOLN
10.0000 mg | Freq: Four times a day (QID) | INTRAMUSCULAR | Status: DC | PRN
  Administered 2015-01-22: 10 mg via INTRAVENOUS
  Filled 2015-01-22: qty 1

## 2015-01-22 NOTE — Plan of Care (Signed)
Problem: Phase I Progression Outcomes Goal: Patient tolerating nututrition at goal Outcome: Progressing Was on Tube feedings during intubation, NPO sips with meds

## 2015-01-22 NOTE — Procedures (Signed)
Extubation Procedure Note  Patient Details:   Name: Ninfa MeekerLinda Gaumond DOB: September 18, 1945 MRN: [id number]   Airway Documentation:     Evaluation  O2 sats: stable throughout Complications: No apparent complications Patient did tolerate procedure well. Bilateral Breath Sounds: Clear Suctioning: Airway Yes   Positive cuff leak, patient able to speak her name, placed on 4 Lpm nasal cannula. RT will continue to monitor.   Rayburn FeltJean S Marnette Perkins 01/22/2015, 9:55 AM

## 2015-01-22 NOTE — Plan of Care (Signed)
Problem: Phase I Progression Outcomes Goal: GIProphysixis Outcome: Completed/Met Date Met:  01/22/15 Pepcid PO

## 2015-01-22 NOTE — Plan of Care (Signed)
Problem: Phase I Progression Outcomes Goal: Baseline oxygen/pH stable Pt extubated today, remains on 4 liters [place], O2 sats within normal range

## 2015-01-22 NOTE — Plan of Care (Signed)
Problem: Phase I Progression Outcomes Goal: VTE prophylaxis Heparin SBQ

## 2015-01-22 NOTE — Plan of Care (Signed)
Problem: Phase II Progression Outcomes Goal: Date pt extubated/weaned off vent Pt extubated 01/22/15
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
## 2015-01-22 NOTE — Progress Notes (Signed)
eLink Physician-Brief Progress Note Patient Name: [name] MeekerLinda [name] DOB: [birth year] MRN: [id number]   Date of Service  01/22/2015  HPI/Events of Note  Patient on PRN sedation.  When this wears off the patient has been attempting to pull ETT.  eICU Interventions  Plan: Anticipate extubation this AM Bilateral soft wrist restraints for patient safety     Intervention Category Major Interventions: Delirium, psychosis, severe agitation - evaluation and management  DETERDING,ELIZABETH 01/22/2015, 4:19 AM

## 2015-01-22 NOTE — Plan of Care (Signed)
Problem: Phase I Progression Outcomes Goal: Voiding-avoid urinary catheter unless indicated Outcome: Progressing Extubated today, paraplegic, has urinary catheter

## 2015-01-22 NOTE — Progress Notes (Signed)
Utilization Review Completed.[name] [name] T7/18/2016  

## 2015-01-22 NOTE — Plan of Care (Signed)
Problem: Phase II Progression Outcomes Goal: Time pt extubated/weaned off vent This Am of 7/18

## 2015-01-22 NOTE — Progress Notes (Signed)
RT attempted wean with CPAP 5 PS 5. Patient had increased RR +40, and agitation. Wean terminated.

## 2015-01-22 NOTE — Plan of Care (Signed)
Problem: Phase I Progression Outcomes Goal: Pain controlled with appropriate interventions Outcome: Completed/Met Date Met:  01/22/15 Pt has been receiving intermittent pain medication PRN

## 2015-01-22 NOTE — Plan of Care (Signed)
Problem: Phase I Progression Outcomes Goal: Initiate hyperglycemia protocol as indicated Outcome: Completed/Met Date Met:  01/22/15 Insulin SBQ CBG Q4

## 2015-01-23 DIAGNOSIS — N179 Acute kidney failure, unspecified: Secondary | ICD-10-CM

## 2015-01-23 LAB — CBC
HCT: 37 % (ref 36.0–46.0)
HEMOGLOBIN: 11.8 g/dL — AB (ref 12.0–15.0)
MCH: 28 pg (ref 26.0–34.0)
MCHC: 31.9 g/dL (ref 30.0–36.0)
MCV: 87.9 fL (ref 78.0–100.0)
Platelets: 215 10*3/uL (ref 150–400)
RBC: 4.21 MIL/uL (ref 3.87–5.11)
RDW: 15.4 % (ref 11.5–15.5)
WBC: 11.6 10*3/uL — ABNORMAL HIGH (ref 4.0–10.5)

## 2015-01-23 LAB — GLUCOSE, CAPILLARY
GLUCOSE-CAPILLARY: 111 mg/dL — AB (ref 65–99)
GLUCOSE-CAPILLARY: 181 mg/dL — AB (ref 65–99)
GLUCOSE-CAPILLARY: 182 mg/dL — AB (ref 65–99)
GLUCOSE-CAPILLARY: 185 mg/dL — AB (ref 65–99)
Glucose-Capillary: 130 mg/dL — ABNORMAL HIGH (ref 65–99)
Glucose-Capillary: 135 mg/dL — ABNORMAL HIGH (ref 65–99)
Glucose-Capillary: 142 mg/dL — ABNORMAL HIGH (ref 65–99)
Glucose-Capillary: 154 mg/dL — ABNORMAL HIGH (ref 65–99)

## 2015-01-23 LAB — PROCALCITONIN: Procalcitonin: <0.1 ng/mL

## 2015-01-23 LAB — BASIC METABOLIC PANEL
ANION GAP: 9 (ref 5–15)
Anion gap: 9 (ref 5–15)
BUN: 11 mg/dL (ref 6–20)
BUN: 12 mg/dL (ref 6–20)
CALCIUM: 9.1 mg/dL (ref 8.9–10.3)
CO2: 30 mmol/L (ref 22–32)
CO2: 25 mmol/L (ref 22–32)
Calcium: 9.1 mg/dL (ref 8.9–10.3)
Chloride: 105 mmol/L (ref 101–111)
Chloride: 101 mmol/L (ref 101–111)
Creatinine, Ser: 0.64 mg/dL (ref 0.44–1.00)
Creatinine, Ser: 0.6 mg/dL (ref 0.44–1.00)
GFR calc non Af Amer: >60 mL/min (ref >60)
GLUCOSE: 110 mg/dL — AB (ref 65–99)
GLUCOSE: 126 mg/dL — AB (ref 65–99)
POTASSIUM: 2.8 mmol/L — AB (ref 3.5–5.1)
POTASSIUM: 4.4 mmol/L (ref 3.5–5.1)
SODIUM: 135 mmol/L (ref 135–145)
Sodium: 144 mmol/L (ref 135–145)

## 2015-01-23 MED ORDER — ADULT MULTIVITAMIN W/MINERALS CH
1.0000 | ORAL_TABLET | Freq: Every day | ORAL | Status: DC
  Administered 2015-01-23 – 2015-01-29 (×7): 1 via ORAL
  Filled 2015-01-23 (×8): qty 1

## 2015-01-23 MED ORDER — POTASSIUM CHLORIDE CRYS ER 20 MEQ PO TBCR
40.0000 meq | EXTENDED_RELEASE_TABLET | ORAL | Status: DC
  Administered 2015-01-23: 40 meq via ORAL
  Filled 2015-01-23 (×2): qty 2

## 2015-01-23 MED ORDER — ADULT MULTIVITAMIN LIQUID CH
5.0000 mL | Freq: Every day | ORAL | Status: DC
  Filled 2015-01-23: qty 5

## 2015-01-23 MED ORDER — POTASSIUM CHLORIDE CRYS ER 20 MEQ PO TBCR: 40.0000 meq | EXTENDED_RELEASE_TABLET | ORAL | Status: DC

## 2015-01-23 MED ORDER — PREDNISONE 10 MG PO TABS
15.0000 mg | ORAL_TABLET | Freq: Every day | ORAL | Status: DC
  Administered 2015-01-24 – 2015-01-29 (×6): 15 mg via ORAL
  Filled 2015-01-23 (×11): qty 1

## 2015-01-23 MED ORDER — POTASSIUM CHLORIDE 10 MEQ/100ML IV SOLN: 10.0000 meq | INTRAVENOUS | Status: DC

## 2015-01-23 MED ORDER — POTASSIUM CHLORIDE CRYS ER 20 MEQ PO TBCR
40.0000 meq | EXTENDED_RELEASE_TABLET | ORAL | Status: AC
  Administered 2015-01-23 (×2): 40 meq via ORAL
  Filled 2015-01-23 (×2): qty 2

## 2015-01-23 NOTE — Progress Notes (Signed)
[facility] Electrolyte Replacement Protocol  Patient Name: [name] [name] DOB: 08/04/1945 MRN: [id number]  Date of Service  01/23/2015   HPI/Events of Note    Recent Labs Lab 01/17/15 0959 01/18/15 0402 01/19/15 0513 01/21/15 1648 01/22/15 0223 01/23/15 0225  NA 141 141 141 144 146* 144  K 3.6 3.4* 3.1* 4.4 4.1 2.8*  CL 107 98* 96* 104 105 105  CO2 '23 30 31 28 29 30  ' GLUCOSE 168* 155* 158* 214* 164* 110*  BUN '11 11 13 ' 24* 18 11  CREATININE 0.50 0.59 0.75 1.40* 0.89 0.64  CALCIUM 9.0 9.3 9.4 8.7* 8.6* 9.1  MG 1.8  --   --   --  1.9  --   PHOS 3.4  --   --   --  2.8  --     Estimated Creatinine Clearance: 59.6 mL/min (by C-G formula based on Cr of 0.64).  Intake/Output      07/18 0701 - 07/19 0700   I.V. (mL/kg) 759.7 (9.2)   NG/GT 320   IV Piggyback 50   Total Intake(mL/kg) 1129.7 (13.6)   Urine (mL/kg/hr) 1185 (0.6)   Total Output 1185   Net -55.3        - I/O DETAILED x24h    Total I/O In: 130 [I.V.:80; IV Piggyback:50] Out: 210 [Urine:210] - I/O THIS SHIFT    ASSESSMENT   eICURN Interventions  Electrolyte protocol criteria met.  Replace per protocol.  MD notified   ASSESSMENT: MAJOR ELECTROLYTE    Lorene Dy 01/23/2015, 6:12 AM

## 2015-01-23 NOTE — Progress Notes (Addendum)
Admission note:  Arrival Method: Patient arrived from 2 MW on bed with the staff accompanying her. Mental Orientation:  Alert and oriented x 4. Telemetry: N/A Assessment: See doc flowsheets. Skin: Warm, dry and intact, except some redness noted on the arms and face and says she has it for the past 2 years. Put some foam dressing on the both ankles red areas which were blanchable.  Blister noted on the right thigh, foam dressing administered. IV: Right forearm, infusing antibiotic, left AC saline lock Pain: Denies any pain currently. Tubes: Foley present, draining amber color urine. Safety Measures:  Low bed position, with soft touch call light and phone at reach.  Fall Prevention Safety Plan: reviewed the plan with the patient, understood and acknowledged. Admission Screening: In progress. 6700 Orientation: Patient has been oriented to the unit, staff and to the room.

## 2015-01-23 NOTE — Progress Notes (Signed)
Notified MD Simonds in regards to pt's K of 2.8 in AM BMET. Will continue to monitor and assess
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
## 2015-01-23 NOTE — Care Management Note (Signed)
Case Management Note  Patient Details  Name: [name] MeekerLinda [name] MRN: [id number] Date of Birth: 1946-03-27  Subjective/Objective:      Pt admitted with sepsis, extubated on 7/18              Action/Plan: PTA pt lived at home with family, NCM to follow for d/c needs and planning  Expected Discharge Date:                  Expected Discharge Plan:  Home w Home Health Services  In-House Referral:     Discharge planning Services  CM Consult   Status of Service:  In process, will continue to follow  Medicare Important Message Given:    Date Medicare IM Given:    Medicare IM give by:    Date Additional Medicare IM Given:    Additional Medicare Important Message give by:     If discussed at Long Length of Stay Meetings, dates discussed:    Additional Comments:  Darrold SpanWebster, Daizha Anand Hall, RN 01/23/2015, 10:31 AM

## 2015-01-24 DIAGNOSIS — A4151 Sepsis due to Escherichia coli [E. coli]: Secondary | ICD-10-CM

## 2015-01-24 LAB — GLUCOSE, CAPILLARY
Glucose-Capillary: 121 mg/dL — ABNORMAL HIGH (ref 65–99)
Glucose-Capillary: 139 mg/dL — ABNORMAL HIGH (ref 65–99)
Glucose-Capillary: 160 mg/dL — ABNORMAL HIGH (ref 65–99)
Glucose-Capillary: 228 mg/dL — ABNORMAL HIGH (ref 65–99)
Glucose-Capillary: 228 mg/dL — ABNORMAL HIGH (ref 65–99)
Glucose-Capillary: 230 mg/dL — ABNORMAL HIGH (ref 65–99)
Glucose-Capillary: 232 mg/dL — ABNORMAL HIGH (ref 65–99)

## 2015-01-24 LAB — BASIC METABOLIC PANEL
Anion gap: 11 (ref 5–15)
BUN: 12 mg/dL (ref 6–20)
CHLORIDE: 105 mmol/L (ref 101–111)
CO2: 25 mmol/L (ref 22–32)
CREATININE: 0.61 mg/dL (ref 0.44–1.00)
Calcium: 9.3 mg/dL (ref 8.9–10.3)
GFR calc Af Amer: >60 mL/min (ref >60)
GLUCOSE: 126 mg/dL — AB (ref 65–99)
POTASSIUM: 3.8 mmol/L (ref 3.5–5.1)
SODIUM: 141 mmol/L (ref 135–145)

## 2015-01-24 MED ORDER — TEMAZEPAM 7.5 MG PO CAPS
7.5000 mg | ORAL_CAPSULE | Freq: Once | ORAL | Status: AC
  Administered 2015-01-24: 7.5 mg via ORAL
  Filled 2015-01-24: qty 1

## 2015-01-24 NOTE — Progress Notes (Signed)
eLink Physician-Brief Progress Note Patient Name: [name] [name] DOB: 1946/01/06 MRN: [id number]   Date of Service  01/24/2015  HPI/Events of Note  Patient requests sleep aide.  eICU Interventions  Will order Restoril 7.5 mg PO X 1.     Intervention Category Minor Interventions: Routine modifications to care plan (e.g. PRN medications for pain, fever)  Tyller Bowlby Eugene 01/24/2015, 12:04 AM

## 2015-01-24 NOTE — H&P (Signed)
[facility] PROGRESS NOTE  [name] [name] RUE:[id number] DOB: 27-Jun-1946 DOA: 01/21/2015 PCP: [name], MD   Brief narrative:    69 yo female presented with altered mental status. She was d/c on 7/15 after being tx for UTI. 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 Noted to have fever, elevated lactic acid, and diarrhea in ED. Urine cx from 01/16/15 with ESBL E coli.  SIGNIFICANT EVENTS: 7/12-15: Hospitalized for Sepsis secondary to UTI 7/17: Returned to ED. Admitted to ICU for Sepsis. 7/18: Extubated. Noted to be confused and altered. 7/19: Significant improvement in mental status. Transfer to floor.  Assessment/Plan:    Acute hypoxic respiratory failure, VDRF - ETT 7/17 >>7/18, compromised airways in the setting of sepsis, AKI - now resolved, respiratory status is stable  - Bronchial hygiene  Sepsis secondary to ESBL UTI - started on zosyn 7/17 --> - enteral vancomycin stopped as C. Diff was negative  - Blood 7/17 >> NGTD - Urine 7/17 >> NG - C diff 7/17 >> Negative - Stool Gram Stain >> abundant Gram negative rods, moderate Gram positive cocci in chains and clusters, moderate Gram positive rods - sepsis etiology now resolved but WBC still up which could be steroid induced   Acute renal failure secondary to sepsis - IVF have been provided and Cr is now WNL  - repeat BMP in AM  Hypokalemia - supplemented and WNL   GERD - Pepcid for SUP  Hx of pariplegia - High risk for delirium - Continue baclofen - Restart neurontin (at lower than home dose), ativan PRN, and requip.  - Holding oxycontin, restoril, stelazine  Hx of bullous pemphigoid - seen by dermatology as outpt (Dr. Wynelle Cleveland) >> on MTX and prednisone as outpt. - MTX has been on hold, continue Prednisone   Obesity - Body mass index is 34.41 kg/(m^2).  DVT prophylaxis - Heparin SQ  Code Status: Full.  Family Communication:  plan of care discussed with the  patient Disposition Plan: Home when stable.   IV access:   Urinary Catheter 7/17 NG 7/17>>7/18 ETT 7/17>>7/18 PIV x 2 (Left Arm and Forearm) 7/17  Procedures and diagnostic studies:    CXR  01/16/2015   No edema or consolidation.     CXR 01/22/2015  Endotracheal tube in good position. NG tube has been placed in the stomach  CXR  01/21/2015  No evidence of acute cardiopulmonary disease.     US Abdomen Limited Ruq  01/16/2015  Gallbladder is full. No gallstones or wall thickening visualized. No sonographic Murphy sign noted.  Increased echogenicity of the liver suggestive of fatty infiltration with poor penetration without obvious mass.     Medical Consultants:  None  Other Consultants:  None  IAnti-Infectives:   Zosyn 7/17 -->  Debbora Presto, MD  [facility] Pager [phone]  If 7PM-7AM, please contact night-coverage [email or web address] Password [facility] 01/24/2015, 6:24 PM   LOS: 3 days   HPI/Subjective: No events overnight.   Objective: Filed Vitals:   01/23/15 2005 01/24/15 0424 01/24/15 0900 01/24/15 1820  BP: 176/73 159/76 118/66 120/69  Pulse: 90 88 98 97  Temp: 98.3 F (36.8 C) 97.9 F (36.6 C) 98.4 F (36.9 C) 98.1 F (36.7 C)  TempSrc:   Oral Oral  Resp: 20 18 18 18   Height:      Weight: 82.555 kg (182 lb)     SpO2: 96% 97% 98% 98%    Intake/Output Summary (Last 24 hours) at 01/24/15 1824 Last data filed at 01/24/15 1820  Gross per 24 hour  Intake   3880 ml  Output   1907 ml  Net   1973 ml    Exam:   General:  Pt is alert, follows commands appropriately, not in acute distress  Cardiovascular: Regular rate and rhythm, S1/S2, no rubs, no gallops  Respiratory: Clear to auscultation bilaterally, no wheezing, diminished breath sounds at bases   Abdomen: Soft, non tender, non distended, bowel sounds present, no guarding  Extremities: No edema, pulses DP and PT palpable bilaterally, macular rash on upper ext bilaterally   Data Reviewed: Basic Metabolic  Panel:  Recent Labs Lab 01/21/15 1648 01/22/15 0223 01/23/15 0225 01/23/15 1931 01/24/15 0433  NA 144 146* 144 135 141  K 4.4 4.1 2.8* 4.4 3.8  CL 104 105 105 101 105  CO2 28 29 30 25 25   GLUCOSE 214* 164* 110* 126* 126*  BUN 24* 18 11 12 12   CREATININE 1.40* 0.89 0.64 0.60 0.61  CALCIUM 8.7* 8.6* 9.1 9.1 9.3  MG  --  1.9  --   --   --   PHOS  --  2.8  --   --   --    Liver Function Tests:  Recent Labs Lab 01/21/15 1648 01/22/15 0223  AST 27 26  ALT 31 30  ALKPHOS 51 49  BILITOT 0.6 0.5  PROT 6.0* 5.4*  ALBUMIN 3.4* 3.2*    Recent Labs Lab 01/21/15 1648  LIPASE 31   CBC:  Recent Labs Lab 01/18/15 0402 01/19/15 0513 01/21/15 1648 01/22/15 0223 01/23/15 0225  WBC 13.1* 16.6* 11.8* 13.7* 11.6*  NEUTROABS  --   --  9.8*  --   --   HGB 14.0 15.3* 13.3 12.3 11.8*  HCT 42.4 46.9* 41.2 38.8 37.0  MCV 84.1 86.4 89.2 88.6 87.9  PLT 215 279 166 210 215   CBG:  Recent Labs Lab 01/23/15 2353 01/24/15 0417 01/24/15 0754 01/24/15 1109 01/24/15 1707  GLUCAP 142* 121* 139* 228*  228* 230*   Recent Results (from the past 240 hour(s))  Blood culture (routine x 2)     Status: None   Collection Time: 01/16/15 10:10 AM  Result Value Ref Range Status   Specimen Description BLOOD RIGHT HAND  Final   Culture   Final    NO GROWTH 5 DAYS Performed at [facility]    Report Status 01/21/2015 FINAL  Final  Blood culture (routine x 2)     Status: None   Collection Time: 01/16/15 10:39 AM  Result Value Ref Range Status   Specimen Description BLOOD LEFT ANTECUBITAL  Final   Culture   Final    NO GROWTH 5 DAYS Performed at [facility]    Report Status 01/21/2015 FINAL  Final  Urine culture     Status: None   Collection Time: 01/16/15 11:33 AM  Result Value Ref Range Status   Specimen Description URINE, RANDOM  Final   Special Requests NONE  Final   Culture   Final    >=100,000 COLONIES/mL ESCHERICHIA COLI Confirmed Extended Spectrum  Beta-Lactamase Producer (ESBL) Performed at [facility]    Report Status 01/18/2015 FINAL  Final   Organism ID, Bacteria ESCHERICHIA COLI  Final      Susceptibility   Escherichia coli - MIC*    AMPICILLIN >=32 RESISTANT Resistant     CEFAZOLIN >=64 RESISTANT Resistant     CEFTRIAXONE >=64 RESISTANT Resistant     CIPROFLOXACIN >=4 RESISTANT Resistant  GENTAMICIN <=1 SENSITIVE Sensitive     IMIPENEM <=0.25 SENSITIVE Sensitive     NITROFURANTOIN <=16 SENSITIVE Sensitive     TRIMETH/SULFA <=20 SENSITIVE Sensitive     AMPICILLIN/SULBACTAM >=32 RESISTANT Resistant     PIP/TAZO <=4 SENSITIVE Sensitive     * >=100,000 COLONIES/mL ESCHERICHIA COLI  MRSA PCR Screening     Status: Abnormal   Collection Time: 01/16/15  3:33 PM  Result Value Ref Range Status   MRSA by PCR INVALID RESULTS, SPECIMEN SENT FOR CULTURE (A) NEGATIVE Final    Comment: RESULT CALLED TO, READ BACK BY AND VERIFIED WITH: JESSICA DEUTSCHIA,RN [phone] @ 1710 BY J SCOTTON        MRSA culture     Status: None   Collection Time: 01/16/15  3:33 PM  Result Value Ref Range Status   Specimen Description NOSE  Final   Special Requests NONE  Final   Report Status 01/18/2015 FINAL  Final  Clostridium Difficile by PCR (not at [facility])     Status: None   Collection Time: 01/17/15  9:30 AM  Result Value Ref Range Status   C difficile by pcr NEGATIVE NEGATIVE Final  Gram stain     Status: None   Collection Time: 01/21/15  4:41 AM  Result Value Ref Range Status   Specimen Description STOOL  Final   Special Requests NONE  Final   Gram Stain   Final    NO WBC SEEN ABUNDANT GRAM NEGATIVE RODS MODERATE GRAM POSITIVE COCCI IN CHAINS IN CLUSTERS MODERATE GRAM POSITIVE RODS RARE BUDDING YEAST SEEN    Report Status 01/22/2015 FINAL  Final  Urine culture     Status: None   Collection Time: 01/21/15  4:30 PM  Result Value Ref Range Status   Specimen Description URINE, CLEAN CATCH  Final   Special Requests NONE  Final    Culture NO GROWTH 1 DAY  Final   Report Status 01/22/2015 FINAL  Final  Blood Culture (routine x 2)     Status: None (Preliminary result)   Collection Time: 01/21/15  4:40 PM  Result Value Ref Range Status   Specimen Description BLOOD RIGHT HAND  Final   Special Requests BOTTLES DRAWN AEROBIC ONLY 5CC  Final   Culture NO GROWTH 3 DAYS  Final   Report Status PENDING  Incomplete  Clostridium Difficile by PCR (not [facility])     Status: None   Collection Time: 01/21/15  4:45 PM  Result Value Ref Range Status   C difficile by pcr NEGATIVE NEGATIVE Final  Blood Culture (routine x 2)     Status: None (Preliminary result)   Collection Time: 01/21/15  5:18 PM  Result Value Ref Range Status   Specimen Description BLOOD RIGHT FOREARM  Final   Special Requests BOTTLES DRAWN AEROBIC AND ANAEROBIC 5CC  Final   Culture NO GROWTH 3 DAYS  Final   Report Status PENDING  Incomplete  MRSA PCR Screening     Status: None   Collection Time: 01/21/15  7:34 PM  Result Value Ref Range Status   MRSA by PCR NEGATIVE NEGATIVE Final     Scheduled Meds: . aspirin EC  81 mg Oral q morning - 10a  . baclofen  10 mg Oral TID  . ezetimibe  10 mg Oral q morning - 10a  . famotidine  20 mg Oral BID  . folic acid  1 mg Oral Daily  . gabapentin  200 mg Oral TID  . heparin subcutaneous  5,000 Units Subcutaneous 3 times per day  . insulin aspart  0-15 Units Subcutaneous 6 times per day  . LORazepam  1 mg Oral TID  . ZOSYN  IV  3.375 g Intravenous 3 times per day  . predniSONE  15 mg Oral Q breakfast  . rOPINIRole  0.5 mg Oral BID  . tamsulosin  0.4 mg Oral QHS   Continuous Infusions: . sodium chloride 10 mL/hr at 01/22/15 1900

## 2015-01-25 LAB — GLUCOSE, CAPILLARY
GLUCOSE-CAPILLARY: 182 mg/dL — AB (ref 65–99)
Glucose-Capillary: 123 mg/dL — ABNORMAL HIGH (ref 65–99)
Glucose-Capillary: 130 mg/dL — ABNORMAL HIGH (ref 65–99)
Glucose-Capillary: 154 mg/dL — ABNORMAL HIGH (ref 65–99)
Glucose-Capillary: 164 mg/dL — ABNORMAL HIGH (ref 65–99)
Glucose-Capillary: 237 mg/dL — ABNORMAL HIGH (ref 65–99)

## 2015-01-25 MED ORDER — CETYLPYRIDINIUM CHLORIDE 0.05 % MT LIQD
7.0000 mL | Freq: Two times a day (BID) | OROMUCOSAL | Status: DC
  Administered 2015-01-25 – 2015-01-29 (×8): 7 mL via OROMUCOSAL

## 2015-01-25 MED ORDER — TEMAZEPAM 15 MG PO CAPS
15.0000 mg | ORAL_CAPSULE | Freq: Once | ORAL | Status: AC
  Administered 2015-01-25: 15 mg via ORAL
  Filled 2015-01-25: qty 1

## 2015-01-25 MED ORDER — DIPHENHYDRAMINE HCL 50 MG PO CAPS: 50.0000 mg | ORAL_CAPSULE | Freq: Once | ORAL | Status: DC

## 2015-01-25 MED ORDER — IBUPROFEN 400 MG PO TABS: 400.0000 mg | ORAL_TABLET | Freq: Once | ORAL | Status: DC

## 2015-01-25 NOTE — Care Management Important Message (Signed)
Important Message  Patient Details  Name: [name] [name] MRN: [id number] Date of Birth: 1945-08-30   Medicare Important Message Given:  Yes-third notification given    RoyalAnnamarie Major, RN 01/25/2015, 12:29 PM

## 2015-01-25 NOTE — H&P (Signed)
[facility] PROGRESS NOTE  [name] [name] ZHY:[id number] DOB: 12/25/1945 DOA: 01/21/2015 PCP: [name], MD   Brief narrative:    69 yo female presented with altered mental status. She was d/c on 7/15 after being tx for UTI. 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 Noted to have fever, elevated lactic acid, and diarrhea in ED. Urine cx from 01/16/15 with ESBL E coli.  SIGNIFICANT EVENTS: 7/12-15: Hospitalized for Sepsis secondary to UTI 7/17: Returned to ED. Admitted to ICU for Sepsis. 7/18: Extubated. Noted to be confused and altered. 7/19: Significant improvement in mental status. Transfer to floor.  Assessment/Plan:    Acute hypoxic respiratory failure, VDRF - ETT 7/17 >>7/18, compromised airways in the setting of sepsis, AKI - now resolved, respiratory status is stable  - Bronchial hygiene to continue   Sepsis secondary to ESBL UTI - started on zosyn 7/17 --> 7/23 - enteral vancomycin stopped as C. Diff was negative  - Blood 7/17 >> NGTD - Urine 7/17 >> NG - C diff 7/17 >> Negative - Stool Gram Stain >> abundant Gram negative rods, moderate Gram positive cocci in chains and clusters, moderate Gram positive rods - sepsis etiology now resolved but WBC still up which could be steroid induced   Diarrhea - ? ABX induced - improving - C. Diff negative   Acute renal failure secondary to sepsis - IVF have been provided and Cr is now WNL  - stop IVF - repeat BMP in AM  Hypokalemia - supplemented and WNL   GERD - Pepcid for SUP  Hx of pariplegia - High risk for delirium - Continue baclofen - Restart neurontin (at lower than home dose), ativan PRN, and requip.  - Holding oxycontin, restoril, stelazine  Hx of bullous pemphigoid - seen by dermatology as outpt (Dr. Wynelle Cleveland) >> on MTX and prednisone as outpt. - MTX has been on hold, continue Prednisone   Obesity - Body mass index is 34.41 kg/(m^2).  DVT prophylaxis - Heparin  SQ  Code Status: Full.  Family Communication:  plan of care discussed with the patient Disposition Plan: Home when stable.   IV access:   Urinary Catheter 7/17 NG 7/17>>7/18 ETT 7/17>>7/18 PIV x 2 (Left Arm and Forearm) 7/17  Procedures and diagnostic studies:    CXR  01/16/2015   No edema or consolidation.     CXR 01/22/2015  Endotracheal tube in good position. NG tube has been placed in the stomach  CXR  01/21/2015  No evidence of acute cardiopulmonary disease.     US Abdomen Limited Ruq  01/16/2015  Gallbladder is full. No gallstones or wall thickening visualized. No sonographic Murphy sign noted.  Increased echogenicity of the liver suggestive of fatty infiltration with poor penetration without obvious mass.     Medical Consultants:  None  Other Consultants:  None  IAnti-Infectives:   Zosyn 7/17 -->  Debbora Presto, MD  [facility] Pager [phone]  If 7PM-7AM, please contact night-coverage [email or web address] Password [facility] 01/25/2015, 12:35 PM   LOS: 4 days   HPI/Subjective: No events overnight.   Objective: Filed Vitals:   01/24/15 1820 01/24/15 2055 01/25/15 0419 01/25/15 1047  BP: 120/69 139/90 165/77 156/75  Pulse: 97 89 82 84  Temp: 98.1 F (36.7 C) 99 F (37.2 C) 97.7 F (36.5 C) 98.8 F (37.1 C)  TempSrc: Oral Oral Oral Oral  Resp: Height:      Weight:      SpO2: 98% 99% 100% 97%  Intake/Output Summary (Last 24 hours) at 01/25/15 1235 Last data filed at 01/25/15 0925  Gross per 24 hour  Intake   2080 ml  Output   2398 ml  Net   -318 ml    Exam:   General:  Pt is alert, follows commands appropriately, not in acute distress  Cardiovascular: Regular rate and rhythm, S1/S2, no rubs, no gallops  Respiratory: Clear to auscultation bilaterally, no wheezing, diminished breath sounds at bases   Abdomen: Soft, non tender, non distended, bowel sounds present, no guarding  Extremities: No edema, pulses DP and PT palpable bilaterally,  macular rash on upper ext bilaterally   Data Reviewed: Basic Metabolic Panel:  Recent Labs Lab 01/21/15 1648 01/22/15 0223 01/23/15 0225 01/23/15 1931 01/24/15 0433  NA 144 146* 144 135 141  K 4.4 4.1 2.8* 4.4 3.8  CL 104 105 105 101 105  CO2 28 29 30 25 25   GLUCOSE 214* 164* 110* 126* 126*  BUN 24* 18 11 12 12   CREATININE 1.40* 0.89 0.64 0.60 0.61  CALCIUM 8.7* 8.6* 9.1 9.1 9.3  MG  --  1.9  --   --   --   PHOS  --  2.8  --   --   --    Liver Function Tests:  Recent Labs Lab 01/21/15 1648 01/22/15 0223  AST 27 26  ALT 31 30  ALKPHOS 51 49  BILITOT 0.6 0.5  PROT 6.0* 5.4*  ALBUMIN 3.4* 3.2*    Recent Labs Lab 01/21/15 1648  LIPASE 31   CBC:  Recent Labs Lab 01/19/15 0513 01/21/15 1648 01/22/15 0223 01/23/15 0225  WBC 16.6* 11.8* 13.7* 11.6*  NEUTROABS  --  9.8*  --   --   HGB 15.3* 13.3 12.3 11.8*  HCT 46.9* 41.2 38.8 37.0  MCV 86.4 89.2 88.6 87.9  PLT 279 166 210 215   CBG:  Recent Labs Lab 01/24/15 2052 01/24/15 2341 01/25/15 0414 01/25/15 0738 01/25/15 1138  GLUCAP 232* 160* 164* 123* 154*   Recent Results (from the past 240 hour(s))  Blood culture (routine x 2)     Status: None   Collection Time: 01/16/15 10:10 AM  Result Value Ref Range Status   Specimen Description BLOOD RIGHT HAND  Final   Culture   Final    NO GROWTH 5 DAYS Performed at [facility]    Report Status 01/21/2015 FINAL  Final  Blood culture (routine x 2)     Status: None   Collection Time: 01/16/15 10:39 AM  Result Value Ref Range Status   Specimen Description BLOOD LEFT ANTECUBITAL  Final   Culture   Final    NO GROWTH 5 DAYS Performed at [facility]    Report Status 01/21/2015 FINAL  Final  Urine culture     Status: None   Collection Time: 01/16/15 11:33 AM  Result Value Ref Range Status   Specimen Description URINE, RANDOM  Final   Special Requests NONE  Final   Culture   Final    >=100,000 COLONIES/mL ESCHERICHIA COLI Confirmed  Extended Spectrum Beta-Lactamase Producer (ESBL) Performed at [facility]    Report Status 01/18/2015 FINAL  Final   Organism ID, Bacteria ESCHERICHIA COLI  Final      Susceptibility   Escherichia coli - MIC*    AMPICILLIN >=32 RESISTANT Resistant     CEFAZOLIN >=64 RESISTANT Resistant     CEFTRIAXONE >=64 RESISTANT Resistant     CIPROFLOXACIN >=4 RESISTANT Resistant  GENTAMICIN <=1 SENSITIVE Sensitive     IMIPENEM <=0.25 SENSITIVE Sensitive     NITROFURANTOIN <=16 SENSITIVE Sensitive     TRIMETH/SULFA <=20 SENSITIVE Sensitive     AMPICILLIN/SULBACTAM >=32 RESISTANT Resistant     PIP/TAZO <=4 SENSITIVE Sensitive     * >=100,000 COLONIES/mL ESCHERICHIA COLI  MRSA PCR Screening     Status: Abnormal   Collection Time: 01/16/15  3:33 PM  Result Value Ref Range Status   MRSA by PCR INVALID RESULTS, SPECIMEN SENT FOR CULTURE (A) NEGATIVE Final    Comment: RESULT CALLED TO, READ BACK BY AND VERIFIED WITH: JESSICA DEUTSCHIA,RN [id number] @ 1710 BY J SCOTTON        MRSA culture     Status: None   Collection Time: 01/16/15  3:33 PM  Result Value Ref Range Status   Specimen Description NOSE  Final   Special Requests NONE  Final   Report Status 01/18/2015 FINAL  Final  Clostridium Difficile by PCR (not at [facility])     Status: None   Collection Time: 01/17/15  9:30 AM  Result Value Ref Range Status   C difficile by pcr NEGATIVE NEGATIVE Final  Gram stain     Status: None   Collection Time: 01/21/15  4:41 AM  Result Value Ref Range Status   Specimen Description STOOL  Final   Special Requests NONE  Final   Gram Stain   Final    NO WBC SEEN ABUNDANT GRAM NEGATIVE RODS MODERATE GRAM POSITIVE COCCI IN CHAINS IN CLUSTERS MODERATE GRAM POSITIVE RODS RARE BUDDING YEAST SEEN    Report Status 01/22/2015 FINAL  Final  Urine culture     Status: None   Collection Time: 01/21/15  4:30 PM  Result Value Ref Range Status   Specimen Description URINE, CLEAN CATCH  Final   Special Requests  NONE  Final   Culture NO GROWTH 1 DAY  Final   Report Status 01/22/2015 FINAL  Final  Blood Culture (routine x 2)     Status: None (Preliminary result)   Collection Time: 01/21/15  4:40 PM  Result Value Ref Range Status   Specimen Description BLOOD RIGHT HAND  Final   Special Requests BOTTLES DRAWN AEROBIC ONLY 5CC  Final   Culture NO GROWTH 3 DAYS  Final   Report Status PENDING  Incomplete  Clostridium Difficile by PCR (not at [facility])     Status: None   Collection Time: 01/21/15  4:45 PM  Result Value Ref Range Status   C difficile by pcr NEGATIVE NEGATIVE Final  Blood Culture (routine x 2)     Status: None (Preliminary result)   Collection Time: 01/21/15  5:18 PM  Result Value Ref Range Status   Specimen Description BLOOD RIGHT FOREARM  Final   Special Requests BOTTLES DRAWN AEROBIC AND ANAEROBIC 5CC  Final   Culture NO GROWTH 3 DAYS  Final   Report Status PENDING  Incomplete  MRSA PCR Screening     Status: None   Collection Time: 01/21/15  7:34 PM  Result Value Ref Range Status   MRSA by PCR NEGATIVE NEGATIVE Final     Scheduled Meds: . aspirin EC  81 mg Oral q morning - 10a  . baclofen  10 mg Oral TID  . ezetimibe  10 mg Oral q morning - 10a  . famotidine  20 mg Oral BID  . folic acid  1 mg Oral Daily  . gabapentin  200 mg Oral TID  . heparin subcutaneous  5,000 Units Subcutaneous 3 times per day  . insulin aspart  0-15 Units Subcutaneous 6 times per day  . LORazepam  1 mg Oral TID  . ZOSYN  IV  3.375 g Intravenous 3 times per day  . predniSONE  15 mg Oral Q breakfast  . rOPINIRole  0.5 mg Oral BID  . tamsulosin  0.4 mg Oral QHS   Continuous Infusions: . sodium chloride 10 mL/hr at 01/22/15 1900

## 2015-01-26 LAB — BASIC METABOLIC PANEL
ANION GAP: 12 (ref 5–15)
BUN: 8 mg/dL (ref 6–20)
CHLORIDE: 105 mmol/L (ref 101–111)
CO2: 23 mmol/L (ref 22–32)
Calcium: 9.2 mg/dL (ref 8.9–10.3)
Creatinine, Ser: 0.75 mg/dL (ref 0.44–1.00)
GFR calc Af Amer: >60 mL/min (ref >60)
Glucose, Bld: 119 mg/dL — ABNORMAL HIGH (ref 65–99)
Potassium: 3.6 mmol/L (ref 3.5–5.1)
Sodium: 140 mmol/L (ref 135–145)

## 2015-01-26 LAB — CULTURE, BLOOD (ROUTINE X 2)
Culture: NO GROWTH
Culture: NO GROWTH

## 2015-01-26 LAB — GLUCOSE, CAPILLARY
GLUCOSE-CAPILLARY: 177 mg/dL — AB (ref 65–99)
GLUCOSE-CAPILLARY: 213 mg/dL — AB (ref 65–99)
Glucose-Capillary: 106 mg/dL — ABNORMAL HIGH (ref 65–99)
Glucose-Capillary: 114 mg/dL — ABNORMAL HIGH (ref 65–99)
Glucose-Capillary: 215 mg/dL — ABNORMAL HIGH (ref 65–99)

## 2015-01-26 LAB — CBC
HCT: 41.3 % (ref 36.0–46.0)
HEMOGLOBIN: 13.8 g/dL (ref 12.0–15.0)
MCH: 28.3 pg (ref 26.0–34.0)
MCHC: 33.4 g/dL (ref 30.0–36.0)
MCV: 84.8 fL (ref 78.0–100.0)
Platelets: 265 10*3/uL (ref 150–400)
RBC: 4.87 MIL/uL (ref 3.87–5.11)
RDW: 14.7 % (ref 11.5–15.5)
WBC: 14 10*3/uL — AB (ref 4.0–10.5)

## 2015-01-26 MED ORDER — ZOLPIDEM TARTRATE 5 MG PO TABS
5.0000 mg | ORAL_TABLET | Freq: Once | ORAL | Status: AC
  Administered 2015-01-27: 5 mg via ORAL
  Filled 2015-01-26: qty 1

## 2015-01-26 NOTE — Progress Notes (Signed)
Patient ID: [name] [name], female   DOB: 1945/11/05, 69 y.o.   MRN: [id number]  [facility] PROGRESS NOTE  [name] [name] FAO:[id number] DOB: 05/13/1946 DOA: 01/21/2015 PCP: [name], MD   Brief narrative:    69 yo female presented with altered mental status. She was d/c on 7/15 after being tx for UTI. 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 Noted to have fever, elevated lactic acid, and diarrhea in ED. Urine cx from 01/16/15 with ESBL E coli.  SIGNIFICANT EVENTS: 7/12-15: Hospitalized for Sepsis secondary to UTI 7/17: Returned to ED. Admitted to ICU for Sepsis. 7/18: Extubated. Noted to be confused and altered. 7/19: Significant improvement in mental status. Transfer to floor.  Assessment/Plan:     Acute hypoxic respiratory failure, VDRF - ETT 7/17 >>7/18, compromised airways in the setting of sepsis, AKI - now resolved, respiratory status is stable  - Bronchial hygiene to continue   Sepsis secondary to ESBL UTI - started on zosyn 7/17 --> 7/23, this will complete 7 days course  - enteral vancomycin stopped as C. Diff was negative  - Blood 7/17 >> NGTD - Urine 7/17 >> NG - C diff 7/17 >> Negative - Stool Gram Stain >> abundant Gram negative rods, moderate Gram positive cocci in chains and clusters, moderate Gram positive rods - sepsis etiology now resolved but WBC still up which os likely steroid induced   Diarrhea - ? ABX induced - improving - C. Diff negative   Acute renal failure secondary to sepsis - IVF have been provided and Cr is now WNL  - pt has been off IVF since 7/21 - repeat BMP in AM  Hypokalemia - supplemented and WNL   GERD - Pepcid for SUP  Hx of pariplegia - High risk for delirium - Continue baclofen - Restart neurontin (at lower than home dose), ativan PRN, and requip.  - Holding oxycontin, restoril, stelazine  Hx of bullous pemphigoid - seen by dermatology as outpt (Dr. Wynelle Cleveland) >> on MTX and  prednisone as outpt. - MTX has been on hold, continue Prednisone   Obesity - Body mass index is 34.41 kg/(m^2).  DVT prophylaxis - Heparin SQ  Code Status: Full.  Family Communication: plan of care discussed with the patient Disposition Plan: Home when completes IV ABX  IV access:   Urinary Catheter 7/17 NG 7/17>>7/18 ETT 7/17>>7/18 PIV x 2 (Left Arm and Forearm) 7/17  Procedures and diagnostic studies:   CXR 01/16/2015 No edema or consolidation.   CXR 01/22/2015 Endotracheal tube in good position. NG tube has been placed in the stomach  CXR 01/21/2015 No evidence of acute cardiopulmonary disease.   US Abdomen Limited Ruq 01/16/2015 Gallbladder is full. No gallstones or wall thickening visualized. No sonographic Murphy sign noted. Increased echogenicity of the liver suggestive of fatty infiltration with poor penetration without obvious mass.   Medical Consultants:  None  Other Consultants:  None  IAnti-Infectives:   Zosyn 7/17 --> 7/23      Debbora Presto, MD  [facility] Pager [phone]  If 7PM-7AM, please contact night-coverage [email or web address] Password [facility] 01/26/2015, 1:39 PM   LOS: 5 days   HPI/Subjective: No events overnight.   Objective: Filed Vitals:   01/25/15 1803 01/25/15 2150 01/26/15 0410 01/26/15 0956  BP: 169/83 178/87 179/86 164/82  Pulse: 82 86 77 82  Temp: 99.1 F (37.3 C) 97.9 F (36.6 C) 98.2 F (36.8 C) 98.5 F (36.9 C)  TempSrc: Oral Oral Oral Oral  Resp: 16 16 16  16  Height:      Weight:      SpO2: 95% 99% 99% 99%    Intake/Output Summary (Last 24 hours) at 01/26/15 1339 Last data filed at 01/26/15 1237  Gross per 24 hour  Intake   1930 ml  Output   2800 ml  Net   -870 ml    Exam:   General:  Pt is alert, follows commands appropriately, not in acute distress  Cardiovascular: Regular rate and rhythm, no rubs, no gallops  Respiratory: Clear to auscultation bilaterally, no wheezing, no  crackles, no rhonchi  Abdomen: Soft, non tender, non distended, bowel sounds present, no guarding  Extremities: No edema, pulses DP and PT palpable bilaterally  Data Reviewed: Basic Metabolic Panel:  Recent Labs Lab 01/22/15 0223 01/23/15 0225 01/23/15 1931 01/24/15 0433 01/26/15 0559  NA 146* 144 135 141 140  K 4.1 2.8* 4.4 3.8 3.6  CL 105 105 101 105 105  CO2 GLUCOSE 164* 110* 126* 126* 119*  BUN CREATININE 0.89 0.64 0.60 0.61 0.75  CALCIUM 8.6* 9.1 9.1 9.3 9.2  MG 1.9  --   --   --   --   PHOS 2.8  --   --   --   --    Liver Function Tests:  Recent Labs Lab 01/21/15 1648 01/22/15 0223  AST 27 26  ALT 31 30  ALKPHOS 51 49  BILITOT 0.6 0.5  PROT 6.0* 5.4*  ALBUMIN 3.4* 3.2*    Recent Labs Lab 01/21/15 1648  LIPASE 31   CBC:  Recent Labs Lab 01/21/15 1648 01/22/15 0223 01/23/15 0225 01/26/15 0559  WBC 11.8* 13.7* 11.6* 14.0*  NEUTROABS 9.8*  --   --   --   HGB 13.3 12.3 11.8* 13.8  HCT 41.2 38.8 37.0 41.3  MCV 89.2 88.6 87.9 84.8  PLT 166 210 215 265     Recent Labs Lab 01/25/15 2030 01/25/15 2331 01/26/15 0401 01/26/15 0736 01/26/15 1130  GLUCAP 182* 130* 106* 114* 177*    Recent Results (from the past 240 hour(s))  MRSA PCR Screening     Status: Abnormal   Collection Time: 01/16/15  3:33 PM  Result Value Ref Range Status   MRSA by PCR INVALID RESULTS, SPECIMEN SENT FOR CULTURE (A) NEGATIVE Final    Comment: RESULT CALLED TO, READ BACK BY AND VERIFIED WITH: JESSICA DEUTSCHIA,RN [id number] @ 1710 BY J SCOTTON        The GeneXpert MRSA Assay (FDA approved for NASAL specimens only), is one component of a comprehensive MRSA colonization surveillance program. It is not intended to diagnose MRSA infection nor to guide or monitor treatment for MRSA infections.   MRSA culture     Status: None   Collection Time: 01/16/15  3:33 PM  Result Value Ref Range Status   Specimen Description NOSE  Final   Special  Requests NONE  Final   Culture NOMRSA Performed at [facility]   Final   Report Status 01/18/2015 FINAL  Final  Clostridium Difficile by PCR (not at [facility])     Status: None   Collection Time: 01/17/15  9:30 AM  Result Value Ref Range Status   C difficile by pcr NEGATIVE NEGATIVE Final  Gram stain     Status: None   Collection Time: 01/21/15  4:41 AM  Result Value Ref Range Status   Specimen Description STOOL  Final   Special Requests NONE  Final   Gram Stain   Final    NO WBC SEEN ABUNDANT GRAM NEGATIVE RODS MODERATE GRAM POSITIVE COCCI IN CHAINS IN CLUSTERS MODERATE GRAM POSITIVE RODS RARE BUDDING YEAST SEEN    Report Status 01/22/2015 FINAL  Final  Urine culture     Status: None   Collection Time: 01/21/15  4:30 PM  Result Value Ref Range Status   Specimen Description URINE, CLEAN CATCH  Final   Special Requests NONE  Final   Culture NO GROWTH 1 DAY  Final   Report Status 01/22/2015 FINAL  Final  Blood Culture (routine x 2)     Status: None   Collection Time: 01/21/15  4:40 PM  Result Value Ref Range Status   Specimen Description BLOOD RIGHT HAND  Final   Special Requests BOTTLES DRAWN AEROBIC ONLY 5CC  Final   Culture NO GROWTH 5 DAYS  Final   Report Status 01/26/2015 FINAL  Final  Clostridium Difficile by PCR (not at [facility])     Status: None   Collection Time: 01/21/15  4:45 PM  Result Value Ref Range Status   C difficile by pcr NEGATIVE NEGATIVE Final  Blood Culture (routine x 2)     Status: None   Collection Time: 01/21/15  5:18 PM  Result Value Ref Range Status   Specimen Description BLOOD RIGHT FOREARM  Final   Special Requests BOTTLES DRAWN AEROBIC AND ANAEROBIC 5CC  Final   Culture NO GROWTH 5 DAYS  Final   Report Status 01/26/2015 FINAL  Final  MRSA PCR Screening     Status: None   Collection Time: 01/21/15  7:34 PM  Result Value Ref Range Status   MRSA by PCR NEGATIVE NEGATIVE Final    Comment:        The GeneXpert MRSA Assay (FDA approved for  NASAL specimens only), is one component of a comprehensive MRSA colonization surveillance program. It is not intended to diagnose MRSA infection nor to guide or monitor treatment for MRSA infections.      Scheduled Meds: . antiseptic oral rinse  7 mL Mouth Rinse BID  . aspirin EC  81 mg Oral q morning - 10a  . baclofen  10 mg Oral TID  . chlorhexidine  15 mL Mouth Rinse BID  . ezetimibe  10 mg Oral q morning - 10a  . famotidine  20 mg Oral BID  . folic acid  1 mg Oral Daily  . gabapentin  200 mg Oral TID  . heparin subcutaneous  5,000 Units Subcutaneous 3 times per day  . insulin aspart  0-15 Units Subcutaneous 6 times per day  . LORazepam  1 mg Oral TID  . multivitamin with minerals  1 tablet Oral Daily  . piperacillin-tazobactam (ZOSYN)  IV  3.375 g Intravenous 3 times per day  . predniSONE  15 mg Oral Q breakfast  . rOPINIRole  0.5 mg Oral BID  . tamsulosin  0.4 mg Oral QHS   Continuous Infusions:

## 2015-01-26 NOTE — Care Management Note (Signed)
Case Management Note  Patient Details  Name: [name] [name] MRN: [id number] Date of Birth: 01-27-1946  Subjective/Objective:    CM following for progression and d/c planning.                Action/Plan: 01/25/2015 Met with pt re d/c needs, pt has [place] aides provided by her Worker's Comp and Worker's Comp case Freight forwarder, Kaleen Odea Pt is unable to provide worker's comp CM phone number she will ask her son to leave this for this CM. 01/26/2015 Pt was able to provide this CM with phone number of her [facility] Comp care manager, Kaleen Odea @ [phone]  Call placed to Ms Ramos and update on pt status and plan to d/c to Monday, January 29, 2015 left as voice mail. Pt informed. Pt states that her son has been in contact with her [facility] aides.   Expected Discharge Date:       01/29/2015           Expected Discharge Plan:  [place]  In-House Referral:  NA  Discharge planning Services  CM Consult  Post Acute Care Choice:    Choice offered to:     DME Arranged:    DME Agency:     HH Arranged:    HH Agency:     Status of Service:  In process, will continue to follow  Medicare Important Message Given:  Yes-third notification given Date Medicare IM Given:    Medicare IM give by:    Date Additional Medicare IM Given:    Additional Medicare Important Message give by:     If discussed at [place] of Stay Meetings, dates discussed:    Additional Comments:  Adron Bene, RN 01/26/2015, 4:11 PM

## 2015-01-27 ENCOUNTER — Encounter (HOSPITAL_COMMUNITY): Payer: Self-pay | Admitting: *Deleted

## 2015-01-27 LAB — CBC
HEMATOCRIT: 39 % (ref 36.0–46.0)
Hemoglobin: 13 g/dL (ref 12.0–15.0)
MCH: 28.1 pg (ref 26.0–34.0)
MCHC: 33.3 g/dL (ref 30.0–36.0)
MCV: 84.2 fL (ref 78.0–100.0)
Platelets: 274 10*3/uL (ref 150–400)
RBC: 4.63 MIL/uL (ref 3.87–5.11)
RDW: 15 % (ref 11.5–15.5)
WBC: 14.4 10*3/uL — ABNORMAL HIGH (ref 4.0–10.5)

## 2015-01-27 LAB — GLUCOSE, CAPILLARY
GLUCOSE-CAPILLARY: 118 mg/dL — AB (ref 65–99)
GLUCOSE-CAPILLARY: 177 mg/dL — AB (ref 65–99)
Glucose-Capillary: 110 mg/dL — ABNORMAL HIGH (ref 65–99)
Glucose-Capillary: 124 mg/dL — ABNORMAL HIGH (ref 65–99)
Glucose-Capillary: 182 mg/dL — ABNORMAL HIGH (ref 65–99)
Glucose-Capillary: 196 mg/dL — ABNORMAL HIGH (ref 65–99)

## 2015-01-27 LAB — BASIC METABOLIC PANEL
Anion gap: 13 (ref 5–15)
BUN: 11 mg/dL (ref 6–20)
CO2: 21 mmol/L — AB (ref 22–32)
Calcium: 9 mg/dL (ref 8.9–10.3)
Chloride: 103 mmol/L (ref 101–111)
Creatinine, Ser: 0.64 mg/dL (ref 0.44–1.00)
GFR calc non Af Amer: >60 mL/min (ref >60)
Glucose, Bld: 111 mg/dL — ABNORMAL HIGH (ref 65–99)
Potassium: 3.2 mmol/L — ABNORMAL LOW (ref 3.5–5.1)
SODIUM: 137 mmol/L (ref 135–145)

## 2015-01-27 MED ORDER — ZOLPIDEM TARTRATE 5 MG PO TABS
5.0000 mg | ORAL_TABLET | Freq: Every evening | ORAL | Status: DC | PRN
  Administered 2015-01-27 – 2015-01-28 (×2): 5 mg via ORAL
  Filled 2015-01-27 (×2): qty 1

## 2015-01-27 MED ORDER — POTASSIUM CHLORIDE CRYS ER 20 MEQ PO TBCR
40.0000 meq | EXTENDED_RELEASE_TABLET | Freq: Once | ORAL | Status: AC
  Administered 2015-01-27: 40 meq via ORAL
  Filled 2015-01-27: qty 2

## 2015-01-27 NOTE — Progress Notes (Signed)
Patient ID: [name], female   DOB: 1945-12-05, 69 y.o.   MRN: [id number]  [facility] PROGRESS NOTE  Esmay Amspacher WUJ:[id number] DOB: 1945-08-31 DOA: 01/21/2015 PCP: [name], MD   Brief narrative:    69 yo female presented with altered mental status. She was d/c on 7/15 after being tx for UTI. 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 Noted to have fever, elevated lactic acid, and diarrhea in ED. Urine cx from 01/16/15 with ESBL E coli.  SIGNIFICANT EVENTS: 7/12-15: Hospitalized for Sepsis secondary to UTI 7/17: Returned to ED. Admitted to ICU for Sepsis. 7/18: Extubated. Noted to be confused and altered. 7/19: Significant improvement in mental status. Transfer to floor.  Assessment/Plan:     Acute hypoxic respiratory failure, VDRF - ETT 7/17 >>7/18, compromised airways in the setting of sepsis, AKI - now resolved, respiratory status is stable  - Bronchial hygiene to continue   Sepsis secondary to ESBL UTI - started on zosyn 7/17 --> 7/23, this will complete 7 days course  - enteral vancomycin stopped as C. Diff was negative  - Blood 7/17 >> NGTD - Urine 7/17 >> NG - C diff 7/17 >> Negative - Stool Gram Stain >> abundant Gram negative rods, moderate Gram positive cocci in chains and clusters, moderate Gram positive rods - sepsis etiology now resolved but WBC still up which is likely steroid induced   Diarrhea - ? ABX induced - will see if it improves off ABX  Acute renal failure secondary to sepsis - IVF have been provided and Cr is now WNL  - pt has been off IVF since 7/21 - repeat BMP in AM  Hypokalemia - supplement and repeat BMP in AM  GERD - Pepcid for SUP  Hx of pariplegia - High risk for delirium - Continue baclofen - Restart neurontin (at lower than home dose), ativan PRN, and requip.  - Holding oxycontin, restoril, stelazine  Hx of bullous pemphigoid - seen by dermatology as outpt (Dr. Wynelle Cleveland) >> on MTX  and prednisone as outpt. - MTX has been on hold, continue Prednisone   Obesity - Body mass index is 34.41 kg/(m^2).  DVT prophylaxis - Heparin SQ  Code Status: Full.  Family Communication: plan of care discussed with the patient Disposition Plan: Home in 1-2 days   IV access:   Urinary Catheter 7/17 NG 7/17>>7/18 ETT 7/17>>7/18 PIV x 2 (Left Arm and Forearm) 7/17  Procedures and diagnostic studies:   CXR 01/16/2015 No edema or consolidation.   CXR 01/22/2015 Endotracheal tube in good position. NG tube has been placed in the stomach  CXR 01/21/2015 No evidence of acute cardiopulmonary disease.   US Abdomen Limited Ruq 01/16/2015 Gallbladder is full. No gallstones or wall thickening visualized. No sonographic Murphy sign noted. Increased echogenicity of the liver suggestive of fatty infiltration with poor penetration without obvious mass.   Medical Consultants:  None  Other Consultants:  None  IAnti-Infectives:   Zosyn 7/17 --> 7/23     Debbora Presto, MD  [facility] Pager [phone]  If 7PM-7AM, please contact night-coverage [email or web address] Password [facility] 01/27/2015, 11:37 AM   LOS: 6 days   HPI/Subjective: No events overnight.   Objective: Filed Vitals:   01/26/15 1757 01/26/15 2125 01/27/15 0452 01/27/15 0806  BP: 165/82 159/75 168/76 163/73  Pulse: 90 81 80 86  Temp:  98.7 F (37.1 C) 98 F (36.7 C) 98.8 F (37.1 C)  TempSrc:  Oral Oral Oral  Resp: 17 17 18  18  Height:      Weight:  83.7 kg (184 lb 8.4 oz)    SpO2: 97% 99% 100% 100%    Intake/Output Summary (Last 24 hours) at 01/27/15 1137 Last data filed at 01/27/15 0600  Gross per 24 hour  Intake    460 ml  Output   2300 ml  Net  -1840 ml    Exam:   General:  Pt is alert, follows commands appropriately, not in acute distress  Cardiovascular: Regular rate and rhythm, no rubs, no gallops  Respiratory: Clear to auscultation bilaterally, no wheezing, no crackles,  no rhonchi  Abdomen: Soft, non tender, non distended, bowel sounds present, no guarding  Extremities: No edema, pulses DP and PT palpable bilaterally  Data Reviewed: Basic Metabolic Panel:  Recent Labs Lab 01/22/15 0223 01/23/15 0225 01/23/15 1931 01/24/15 0433 01/26/15 0559 01/27/15 0257  NA 146* 144 135 141 140 137  K 4.1 2.8* 4.4 3.8 3.6 3.2*  CL 105 105 101 105 105 103  CO2 29 30 25 25 23  21*  GLUCOSE 164* 110* 126* 126* 119* 111*  BUN 18 11 12 12 8 11   CREATININE 0.89 0.64 0.60 0.61 0.75 0.64  CALCIUM 8.6* 9.1 9.1 9.3 9.2 9.0  MG 1.9  --   --   --   --   --   PHOS 2.8  --   --   --   --   --    Liver Function Tests:  Recent Labs Lab 01/21/15 1648 01/22/15 0223  AST 27 26  ALT 31 30  ALKPHOS 51 49  BILITOT 0.6 0.5  PROT 6.0* 5.4*  ALBUMIN 3.4* 3.2*    Recent Labs Lab 01/21/15 1648  LIPASE 31   CBC:  Recent Labs Lab 01/21/15 1648 01/22/15 0223 01/23/15 0225 01/26/15 0559 01/27/15 0257  WBC 11.8* 13.7* 11.6* 14.0* 14.4*  NEUTROABS 9.8*  --   --   --   --   HGB 13.3 12.3 11.8* 13.8 13.0  HCT 41.2 38.8 37.0 41.3 39.0  MCV 89.2 88.6 87.9 84.8 84.2  PLT 166 210 215 265 274     Recent Labs Lab 01/26/15 1642 01/26/15 2112 01/26/15 2354 01/27/15 0443 01/27/15 0806  GLUCAP 213* 215* 118* 110* 124*    Recent Results (from the past 240 hour(s))  Gram stain     Status: None   Collection Time: 01/21/15  4:41 AM  Result Value Ref Range Status   Specimen Description STOOL  Final   Special Requests NONE  Final   Gram Stain   Final    NO WBC SEEN ABUNDANT GRAM NEGATIVE RODS MODERATE GRAM POSITIVE COCCI IN CHAINS IN CLUSTERS MODERATE GRAM POSITIVE RODS RARE BUDDING YEAST SEEN    Report Status 01/22/2015 FINAL  Final  Urine culture     Status: None   Collection Time: 01/21/15  4:30 PM  Result Value Ref Range Status   Specimen Description URINE, CLEAN CATCH  Final   Special Requests NONE  Final   Culture NO GROWTH 1 DAY  Final   Report  Status 01/22/2015 FINAL  Final  Blood Culture (routine x 2)     Status: None   Collection Time: 01/21/15  4:40 PM  Result Value Ref Range Status   Specimen Description BLOOD RIGHT HAND  Final   Special Requests BOTTLES DRAWN AEROBIC ONLY 5CC  Final   Culture NO GROWTH 5 DAYS  Final   Report Status 01/26/2015 FINAL  Final  Clostridium Difficile by PCR (  not at [facility])     Status: None   Collection Time: 01/21/15  4:45 PM  Result Value Ref Range Status   C difficile by pcr NEGATIVE NEGATIVE Final  Blood Culture (routine x 2)     Status: None   Collection Time: 01/21/15  5:18 PM  Result Value Ref Range Status   Specimen Description BLOOD RIGHT FOREARM  Final   Special Requests BOTTLES DRAWN AEROBIC AND ANAEROBIC 5CC  Final   Culture NO GROWTH 5 DAYS  Final   Report Status 01/26/2015 FINAL  Final  MRSA PCR Screening     Status: None   Collection Time: 01/21/15  7:34 PM  Result Value Ref Range Status   MRSA by PCR NEGATIVE NEGATIVE Final    Comment:        The GeneXpert MRSA Assay (FDA approved for NASAL specimens only), is one component of a comprehensive MRSA colonization surveillance program. It is not intended to diagnose MRSA infection nor to guide or monitor treatment for MRSA infections.      Scheduled Meds: . antiseptic oral rinse  7 mL Mouth Rinse BID  . aspirin EC  81 mg Oral q morning - 10a  . baclofen  10 mg Oral TID  . chlorhexidine  15 mL Mouth Rinse BID  . ezetimibe  10 mg Oral q morning - 10a  . famotidine  20 mg Oral BID  . folic acid  1 mg Oral Daily  . gabapentin  200 mg Oral TID  . heparin subcutaneous  5,000 Units Subcutaneous 3 times per day  . insulin aspart  0-15 Units Subcutaneous 6 times per day  . LORazepam  1 mg Oral TID  . multivitamin with minerals  1 tablet Oral Daily  . piperacillin-tazobactam (ZOSYN)  IV  3.375 g Intravenous 3 times per day  . predniSONE  15 mg Oral Q breakfast  . rOPINIRole  0.5 mg Oral BID  . tamsulosin  0.4 mg Oral QHS    Continuous Infusions:

## 2015-01-28 LAB — BASIC METABOLIC PANEL
ANION GAP: 11 (ref 5–15)
BUN: 11 mg/dL (ref 6–20)
CALCIUM: 9.8 mg/dL (ref 8.9–10.3)
CHLORIDE: 103 mmol/L (ref 101–111)
CO2: 23 mmol/L (ref 22–32)
Creatinine, Ser: 0.62 mg/dL (ref 0.44–1.00)
GFR calc Af Amer: >60 mL/min (ref >60)
Glucose, Bld: 108 mg/dL — ABNORMAL HIGH (ref 65–99)
Potassium: 3.9 mmol/L (ref 3.5–5.1)
Sodium: 137 mmol/L (ref 135–145)

## 2015-01-28 LAB — GLUCOSE, CAPILLARY
GLUCOSE-CAPILLARY: 141 mg/dL — AB (ref 65–99)
GLUCOSE-CAPILLARY: 250 mg/dL — AB (ref 65–99)
GLUCOSE-CAPILLARY: 170 mg/dL — AB (ref 65–99)
Glucose-Capillary: 106 mg/dL — ABNORMAL HIGH (ref 65–99)
Glucose-Capillary: 107 mg/dL — ABNORMAL HIGH (ref 65–99)
Glucose-Capillary: 142 mg/dL — ABNORMAL HIGH (ref 65–99)

## 2015-01-28 LAB — CBC
HCT: 42.1 % (ref 36.0–46.0)
HEMOGLOBIN: 14.5 g/dL (ref 12.0–15.0)
MCH: 29.1 pg (ref 26.0–34.0)
MCHC: 34.4 g/dL (ref 30.0–36.0)
MCV: 84.5 fL (ref 78.0–100.0)
PLATELETS: 316 10*3/uL (ref 150–400)
RBC: 4.98 MIL/uL (ref 3.87–5.11)
RDW: 15.1 % (ref 11.5–15.5)
WBC: 16.2 10*3/uL — AB (ref 4.0–10.5)

## 2015-01-28 MED ORDER — ACETAMINOPHEN 325 MG PO TABS: 650.0000 mg | ORAL_TABLET | Freq: Four times a day (QID) | ORAL | Status: DC | PRN

## 2015-01-28 MED ORDER — ACETAMINOPHEN 325 MG PO TABS
650.0000 mg | ORAL_TABLET | Freq: Four times a day (QID) | ORAL | Status: DC | PRN
  Administered 2015-01-28: 650 mg via ORAL
  Filled 2015-01-28: qty 2

## 2015-01-28 MED ORDER — PANTOPRAZOLE SODIUM 40 MG PO TBEC
40.0000 mg | DELAYED_RELEASE_TABLET | Freq: Every day | ORAL | Status: DC
  Administered 2015-01-28 – 2015-01-29 (×2): 40 mg via ORAL
  Filled 2015-01-28 (×2): qty 1

## 2015-01-28 MED ORDER — ACETAMINOPHEN 160 MG/5ML PO SOLN
650.0000 mg | Freq: Four times a day (QID) | ORAL | Status: DC | PRN
  Filled 2015-01-28: qty 20.3

## 2015-01-28 MED ORDER — GI COCKTAIL ~~LOC~~
30.0000 mL | Freq: Three times a day (TID) | ORAL | Status: DC | PRN
  Administered 2015-01-28: 30 mL via ORAL
  Filled 2015-01-28: qty 30

## 2015-01-28 MED ORDER — INSULIN ASPART 100 UNIT/ML ~~LOC~~ SOLN
0.0000 [IU] | Freq: Two times a day (BID) | SUBCUTANEOUS | Status: DC
  Administered 2015-01-28: 5 [IU] via SUBCUTANEOUS

## 2015-01-28 NOTE — Progress Notes (Signed)
Patient ID: [name] [name], female   DOB: [birth year], 69 y.o.   MRN: [id number]  [facility] PROGRESS NOTE  [name] [name] WUJ:[id number] DOB: [birth year] DOA: 01/21/2015 PCP: [name], MD   Brief narrative:    69 yo female presented with altered mental status. She was d/c on 7/15 after being tx for UTI. 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 Noted to have fever, elevated lactic acid, and diarrhea in ED. Urine cx from 01/16/15 with ESBL E coli.  SIGNIFICANT EVENTS: 7/12-15: Hospitalized for Sepsis secondary to UTI 7/17: Returned to ED. Admitted to ICU for Sepsis. 7/18: Extubated. Noted to be confused and altered. 7/19: Significant improvement in mental status. Transfer to floor.  Assessment/Plan:     Acute hypoxic respiratory failure, VDRF - ETT 7/17 >>7/18, compromised airways in the setting of sepsis, AKI - now resolved, respiratory status is stable  - Bronchial hygiene to continue   Sepsis secondary to ESBL UTI - started on zosyn 7/17 --> 7/23, this will complete 7 days course  - enteral vancomycin stopped as C. Diff was negative  - Blood 7/17 >> NGTD - Urine 7/17 >> NG - C diff 7/17 >> Negative - Stool Gram Stain >> abundant Gram negative rods, moderate Gram positive cocci in chains and clusters, moderate Gram positive rods - sepsis etiology now resolved but WBC still up which is likely steroid induced   Diarrhea - ? ABX induced - improving overall   Acute renal failure secondary to sepsis - IVF have been provided and Cr is now WNL  - pt has been off IVF since 7/21 - repeat BMP in AM  Hypokalemia - supplemented and WNL this AM  GERD - Protonix added as well as GI cocktail as needed   Hx of pariplegia - High risk for delirium - Continue baclofen - Restart neurontin (at lower than home dose), ativan PRN, and requip.  - Holding oxycontin, restoril, stelazine  Hx of bullous pemphigoid - seen by dermatology as outpt (Dr. Wynelle Cleveland) >> on MTX and prednisone as outpt. - MTX has been on hold, continue Prednisone  - added Protonix while on Prednisone   Obesity - Body mass index is 34.41 kg/(m^2).  DVT prophylaxis - Heparin SQ  Code Status: Full.  Family Communication: plan of care discussed with the patient Disposition Plan: Home in 24 hours   IV access:   Urinary Catheter 7/17 NG 7/17>>7/18 ETT 7/17>>7/18 PIV x 2 (Left Arm and Forearm) 7/17  Procedures and diagnostic studies:   CXR 01/16/2015 No edema or consolidation.   CXR 01/22/2015 Endotracheal tube in good position. NG tube has been placed in the stomach  CXR 01/21/2015 No evidence of acute cardiopulmonary disease.   US Abdomen Limited Ruq 01/16/2015 Gallbladder is full. No gallstones or wall thickening visualized. No sonographic Murphy sign noted. Increased echogenicity of the liver suggestive of fatty infiltration with poor penetration without obvious mass.   Medical Consultants:  None  Other Consultants:  None  IAnti-Infectives:   Zosyn 7/17 --> 7/23     Debbora Presto, MD  [facility] Pager [phone]  If 7PM-7AM, please contact night-coverage [email or web address] Password [facility] 01/28/2015, 7:55 AM   LOS: 7 days   HPI/Subjective: No events overnight.   Objective: Filed Vitals:   01/27/15 0806 01/27/15 1626 01/27/15 2013 01/28/15 0528  BP: 163/73 151/91 166/80 163/71  Pulse: 86 98 92 89  Temp: 98.8 F (37.1 C) 98.8 F (37.1 C) 98.7 F (37.1 C) 98.5 F (36.9 C)  TempSrc: Oral Oral Oral Oral  Resp: 18 18 16 16   Height:      Weight:    85.241 kg (187 lb 14.8 oz)  SpO2: 100% 99% 98% 97%    Intake/Output Summary (Last 24 hours) at 01/28/15 0755 Last data filed at 01/28/15 0536  Gross per 24 hour  Intake    480 ml  Output   3200 ml  Net  -2720 ml    Exam:   General:  Pt is alert, follows commands appropriately, not in acute distress  Cardiovascular: Regular rate and rhythm, no rubs, no  gallops  Respiratory: Clear to auscultation bilaterally, no wheezing, diminished breath sounds at bases   Abdomen: Soft, non tender, non distended, bowel sounds present, no guarding  Extremities: No edema, pulses DP and PT palpable bilaterally  Data Reviewed: Basic Metabolic Panel:  Recent Labs Lab 01/22/15 0223  01/23/15 1931 01/24/15 0433 01/26/15 0559 01/27/15 0257 01/28/15 0518  NA 146*  < > 135 141 140 137 137  K 4.1  < > 4.4 3.8 3.6 3.2* 3.9  CL 105  < > 101 105 105 103 103  CO2 29  < > 25 25 23  21* 23  GLUCOSE 164*  < > 126* 126* 119* 111* 108*  BUN 18  < > 12 12 8 11 11   CREATININE 0.89  < > 0.60 0.61 0.75 0.64 0.62  CALCIUM 8.6*  < > 9.1 9.3 9.2 9.0 9.8  MG 1.9  --   --   --   --   --   --   PHOS 2.8  --   --   --   --   --   --   < > = values in this interval not displayed. Liver Function Tests:  Recent Labs Lab 01/21/15 1648 01/22/15 0223  AST 27 26  ALT 31 30  ALKPHOS 51 49  BILITOT 0.6 0.5  PROT 6.0* 5.4*  ALBUMIN 3.4* 3.2*    Recent Labs Lab 01/21/15 1648  LIPASE 31   CBC:  Recent Labs Lab 01/21/15 1648 01/22/15 0223 01/23/15 0225 01/26/15 0559 01/27/15 0257 01/28/15 0518  WBC 11.8* 13.7* 11.6* 14.0* 14.4* 16.2*  NEUTROABS 9.8*  --   --   --   --   --   HGB 13.3 12.3 11.8* 13.8 13.0 14.5  HCT 41.2 38.8 37.0 41.3 39.0 42.1  MCV 89.2 88.6 87.9 84.8 84.2 84.5  PLT 166 210 215 265 274 316     Recent Labs Lab 01/27/15 1213 01/27/15 1626 01/27/15 2010 01/28/15 0006 01/28/15 0527  GLUCAP 177* 182* 196* 142* 107*    Recent Results (from the past 240 hour(s))  Gram stain     Status: None   Collection Time: 01/21/15  4:41 AM  Result Value Ref Range Status   Specimen Description STOOL  Final   Special Requests NONE  Final   Gram Stain   Final    NO WBC SEEN ABUNDANT GRAM NEGATIVE RODS MODERATE GRAM POSITIVE COCCI IN CHAINS IN CLUSTERS MODERATE GRAM POSITIVE RODS RARE BUDDING YEAST SEEN    Report Status 01/22/2015 FINAL   Final  Urine culture     Status: None   Collection Time: 01/21/15  4:30 PM  Result Value Ref Range Status   Specimen Description URINE, CLEAN CATCH  Final   Special Requests NONE  Final   Culture NO GROWTH 1 DAY  Final   Report Status 01/22/2015 FINAL  Final  Blood Culture (routine x  2)     Status: None   Collection Time: 01/21/15  4:40 PM  Result Value Ref Range Status   Specimen Description BLOOD RIGHT HAND  Final   Special Requests BOTTLES DRAWN AEROBIC ONLY 5CC  Final   Culture NO GROWTH 5 DAYS  Final   Report Status 01/26/2015 FINAL  Final  Clostridium Difficile by PCR (not at [facility])     Status: None   Collection Time: 01/21/15  4:45 PM  Result Value Ref Range Status   C difficile by pcr NEGATIVE NEGATIVE Final  Blood Culture (routine x 2)     Status: None   Collection Time: 01/21/15  5:18 PM  Result Value Ref Range Status   Specimen Description BLOOD RIGHT FOREARM  Final   Special Requests BOTTLES DRAWN AEROBIC AND ANAEROBIC 5CC  Final   Culture NO GROWTH 5 DAYS  Final   Report Status 01/26/2015 FINAL  Final  MRSA PCR Screening     Status: None   Collection Time: 01/21/15  7:34 PM  Result Value Ref Range Status   MRSA by PCR NEGATIVE NEGATIVE Final    Comment:        The GeneXpert MRSA Assay (FDA approved for NASAL specimens only), is one component of a comprehensive MRSA colonization surveillance program. It is not intended to diagnose MRSA infection nor to guide or monitor treatment for MRSA infections.      Scheduled Meds: . antiseptic oral rinse  7 mL Mouth Rinse BID  . aspirin EC  81 mg Oral q morning - 10a  . baclofen  10 mg Oral TID  . chlorhexidine  15 mL Mouth Rinse BID  . ezetimibe  10 mg Oral q morning - 10a  . famotidine  20 mg Oral BID  . folic acid  1 mg Oral Daily  . gabapentin  200 mg Oral TID  . heparin subcutaneous  5,000 Units Subcutaneous 3 times per day  . insulin aspart  0-15 Units Subcutaneous 6 times per day  . LORazepam  1 mg Oral  TID  . multivitamin with minerals  1 tablet Oral Daily  . piperacillin-tazobactam (ZOSYN)  IV  3.375 g Intravenous 3 times per day  . predniSONE  15 mg Oral Q breakfast  . rOPINIRole  0.5 mg Oral BID  . tamsulosin  0.4 mg Oral QHS   Continuous Infusions:

## 2015-01-29 DIAGNOSIS — J9601 Acute respiratory failure with hypoxia: Secondary | ICD-10-CM

## 2015-01-29 LAB — CBC
HCT: 41.5 % (ref 36.0–46.0)
Hemoglobin: 14.3 g/dL (ref 12.0–15.0)
MCH: 28.9 pg (ref 26.0–34.0)
MCHC: 34.5 g/dL (ref 30.0–36.0)
MCV: 83.8 fL (ref 78.0–100.0)
Platelets: 297 10*3/uL (ref 150–400)
RBC: 4.95 MIL/uL (ref 3.87–5.11)
RDW: 15.1 % (ref 11.5–15.5)
WBC: 14.9 10*3/uL — AB (ref 4.0–10.5)

## 2015-01-29 LAB — BASIC METABOLIC PANEL
ANION GAP: 10 (ref 5–15)
BUN: 10 mg/dL (ref 6–20)
CO2: 22 mmol/L (ref 22–32)
Calcium: 9.2 mg/dL (ref 8.9–10.3)
Chloride: 105 mmol/L (ref 101–111)
Creatinine, Ser: 0.56 mg/dL (ref 0.44–1.00)
GFR calc non Af Amer: >60 mL/min (ref >60)
GLUCOSE: 109 mg/dL — AB (ref 65–99)
Potassium: 3.5 mmol/L (ref 3.5–5.1)
SODIUM: 137 mmol/L (ref 135–145)

## 2015-01-29 LAB — GLUCOSE, CAPILLARY
GLUCOSE-CAPILLARY: 110 mg/dL — AB (ref 65–99)
Glucose-Capillary: 146 mg/dL — ABNORMAL HIGH (ref 65–99)

## 2015-01-29 MED ORDER — OXYCODONE HCL 5 MG PO TABS: 5.0000 mg | ORAL_TABLET | ORAL | Status: DC | PRN

## 2015-01-29 NOTE — Care Management Important Message (Signed)
Important Message  Patient Details  Name: [name] [name] MRN: [id number] Date of Birth: Aug 30, 1945   Medicare Important Message Given:  Yes-third notification given    RoyalAnnamarie Major, RN 01/29/2015, 10:34 AM

## 2015-01-29 NOTE — Discharge Instructions (Signed)
Acute Respiratory Failure °Respiratory failure is when your lungs are not working well and your breathing (respiratory) system fails. When respiratory failure occurs, it is difficult for your lungs to get enough oxygen, get rid of carbon dioxide, or both. Respiratory failure can be life threatening.  °Respiratory failure can be acute or chronic. Acute respiratory failure is sudden, severe, and requires emergency medical treatment. Chronic respiratory failure is less severe, happens over time, and requires ongoing treatment.  °WHAT ARE THE CAUSES OF ACUTE RESPIRATORY FAILURE?  °Any problem affecting the heart or lungs can cause acute respiratory failure. Some of these causes include the following: °· Chronic bronchitis and emphysema (COPD).   °· Blood clot going to a lung (pulmonary embolism).   °· Having water in the lungs caused by heart failure, lung injury, or infection (pulmonary edema).   °· Collapsed lung (pneumothorax).   °· Pneumonia.   °· Pulmonary fibrosis.   °· Obesity.   °· Asthma.   °· Heart failure.   °· Any type of trauma to the chest that can make breathing difficult.   °· Nerve or muscle diseases making chest movements difficult. °WHAT SYMPTOMS SHOULD YOU WATCH FOR?  °If you have any of these signs or symptoms, you should seek immediate medical care:  °· You have shortness of breath (dyspnea) with or without activity.   °· You have rapid, fast breathing (tachypnea).   °· You are wheezing. °· You are unable to say more than a few words without having to catch your breath. °· You find it very difficult to function normally. °· You have a fast heart rate.   °· You have a bluish color to your finger or toe nail beds.   °· You have confusion or drowsiness or both.   °HOW WILL MY ACUTE RESPIRATORY FAILURE BE TREATED?  °Treatment of acute respiratory failure depends on the cause of the respiratory failure. Usually, you will stay in the intensive care unit so your breathing can be watched closely. Treatment  can include the following: °· Oxygen. Oxygen can be delivered through the following: °¨ Nasal cannula. This is small tubing that goes in your nose to give you oxygen. °¨ Face mask. A face mask covers your nose and mouth to give you oxygen. °· Medicine. Different medicines can be given to help with breathing. These can include: °¨ Nebulizers. Nebulizers deliver medicines to open the air passages (bronchodilators). These medicines help to open or relax the airways in the lungs so you can breathe better. They can also help loosen mucus from your lungs. °¨ Diuretics. Diuretic medicines can help you breathe better by getting rid of extra water in your body. °¨ Steroids. Steroid medicines can help decrease swelling (inflammation) in your lungs. °¨ Antibiotics. °· Chest tube. If you have a collapsed lung (pneumothorax), a chest tube is placed to help reinflate the lung. °· Non-invasive positive pressure ventilation (NPPV). This is a tight-fitting mask that goes over your nose and mouth. The mask has tubing that is attached to a machine. The machine blows air into the tubing, which helps to keep the tiny air sacs (alveoli) in your lungs open. This machine allows you to breathe on your own. °· Ventilator. A ventilator is a breathing machine. When on a ventilator, a breathing tube is put into the lungs. A ventilator is used when you can no longer breathe well enough on your own. You may have low oxygen levels or high carbon dioxide (CO2) levels in your blood. When you are on a ventilator, sedation and pain medicines are given to make you sleep   so your lungs can heal. °Document Released: 06/28/2013 Document Revised: 11/07/2013 Document Reviewed: 06/28/2013 °ExitCare® Patient Information ©2015 [employer]. This information is not intended to replace advice given to you by your health care provider. Make sure you discuss any questions you have with your health care provider. ° °

## 2015-01-29 NOTE — Discharge Summary (Signed)
Physician Discharge Summary  [name] [name] RUE:[id number] DOB: [birth year] DOA: 01/21/2015  PCP: [name], MD  Admit date: 01/21/2015 Discharge date: 01/29/2015  Recommendations for Outpatient Follow-up:  1. Pt will need to follow up with PCP in 2-3 weeks post discharge 2. Please obtain BMP to evaluate electrolytes and kidney function 3. Please also check CBC to evaluate Hg and Hct levels  Discharge Diagnoses:  Active Problems:   Sepsis  Discharge Condition: Stable  Diet recommendation: Heart healthy diet discussed in details    Brief narrative:    69 yo female presented with altered mental status. She was d/c on 7/15 after being tx for UTI. 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 Noted to have fever, elevated lactic acid, and diarrhea in ED. Urine cx from 01/16/15 with ESBL E coli.  SIGNIFICANT EVENTS: 7/12-15: Hospitalized for Sepsis secondary to UTI 7/17: Returned to ED. Admitted to ICU for Sepsis. 7/18: Extubated. Noted to be confused and altered. 7/19: Significant improvement in mental status. Transfer to floor.  Assessment/Plan:     Acute hypoxic respiratory failure, VDRF - ETT 7/17 >>7/18, compromised airways in the setting of sepsis, AKI - now resolved, respiratory status is stable  - Bronchial hygiene continued  Sepsis secondary to ESBL UTI - started on zosyn 7/17 --> 7/23, this completed 7 days course  - enteral vancomycin stopped as C. Diff was negative  - Blood 7/17 >> NGTD - Urine 7/17 >> NG - C diff 7/17 >> Negative - Stool Gram Stain >> abundant Gram negative rods, moderate Gram positive cocci in chains and clusters, moderate Gram positive rods - sepsis etiology now resolved but WBC still up which is likely steroid induced   Diarrhea - ? ABX induced - resolved   Acute renal failure secondary to sepsis - IVF have been provided and Cr is now WNL  - pt has been off IVF since 7/21 - repeat BMP in AM  Hypokalemia -  supplemented and WNL this AM  Hx of pariplegia - High risk for delirium - Continue baclofen  Hx of bullous pemphigoid - seen by dermatology as outpt (Dr. Wynelle Cleveland) >> on MTX and prednisone as outpt. - MTX has been on hold, continue Prednisone  - added Protonix while on Prednisone   Obesity - Body mass index is 34.41 kg/(m^2).  Code Status: Full.  Family Communication: plan of care discussed with the patient Disposition Plan: Home   IV access:   Urinary Catheter 7/17 NG 7/17>>7/18 ETT 7/17>>7/18 PIV x 2 (Left Arm and Forearm) 7/17  Procedures and diagnostic studies:   CXR 01/16/2015 No edema or consolidation.   CXR 01/22/2015 Endotracheal tube in good position. NG tube has been placed in the stomach  CXR 01/21/2015 No evidence of acute cardiopulmonary disease.   US Abdomen Limited Ruq 01/16/2015 Gallbladder is full. No gallstones or wall thickening visualized. No sonographic Murphy sign noted. Increased echogenicity of the liver suggestive of fatty infiltration with poor penetration without obvious mass.   Medical Consultants:  None  Other Consultants:  None  IAnti-Infectives:   Zosyn 7/17 --> 7/23          Discharge Exam: Filed Vitals:   01/29/15 0500  BP: 154/75  Pulse: 85  Temp: 98.6 F (37 C)  Resp: 16   Filed Vitals:   01/28/15 0528 01/28/15 1626 01/28/15 2236 01/29/15 0500  BP: 163/71 175/87 145/62 154/75  Pulse: 89 100 89 85  Temp: 98.5 F (36.9 C) 100.5 F (38.1 C) 98.3  F (36.8 C) 98.6 F (37 C)  TempSrc: Oral Oral Oral Oral  Resp: Height:      Weight: 85.241 kg (187 lb 14.8 oz)  85.724 kg (188 lb 15.8 oz)   SpO2: 97% 99% 100% 100%    General: Pt is alert, follows commands appropriately, not in acute distress Cardiovascular: Regular rate and rhythm,  no rubs, no gallops Respiratory: Clear to auscultation bilaterally, no wheezing, no crackles, no rhonchi Abdominal: Soft, non  tender, non distended, bowel sounds +, no guarding  Discharge Instructions  Discharge Instructions    Diet - low sodium heart healthy    Complete by:  As directed      Increase activity slowly    Complete by:  As directed             Medication List    STOP taking these medications        OxyCODONE 20 mg T12a 12 hr tablet  Commonly known as:  OXYCONTIN  Replaced by:  oxyCODONE 5 MG immediate release tablet      TAKE these medications        ADVIL ALLERGY SINUS 2-30-200 MG Tabs  Generic drug:  Chlorpheniramine-PSE-Ibuprofen  Take 1 tablet by mouth daily as needed (for allergies).     ADVIL PM 200-38 MG Tabs  Generic drug:  Ibuprofen-Diphenhydramine Cit  Take 1-2 tablets by mouth at bedtime as needed (for pain and sleep).     aspirin EC 81 MG tablet  Take 81 mg by mouth every morning.     baclofen 10 MG tablet  Commonly known as:  LIORESAL  Take 10 mg by mouth 3 (three) times daily.     calcium-vitamin D 500-200 MG-UNIT per tablet  Commonly known as:  OSCAL WITH D  Take 1 tablet by mouth daily with breakfast.     [employer] SPECTRAVITE ADULT 50+ PO  Take 1 tablet by mouth daily.     ezetimibe 10 MG tablet  Commonly known as:  ZETIA  Take 10 mg by mouth every morning.     folic acid 1 MG tablet  Commonly known as:  FOLVITE  Take 1 mg by mouth daily.     gabapentin 300 MG capsule  Commonly known as:  NEURONTIN  Take 300 mg by mouth 3 (three) times daily.     LORazepam 1 MG tablet  Commonly known as:  ATIVAN  Take 0.5 tablets (0.5 mg total) by mouth every 12 (twelve) hours as needed for anxiety.     magnesium oxide 400 MG tablet  Commonly known as:  MAG-OX  Take 400 mg by mouth 2 (two) times daily.     methotrexate 2.5 MG tablet  Commonly known as:  RHEUMATREX  Take 7.5 mg by mouth every Tuesday. Caution:Chemotherapy. Protect from light.     omeprazole 20 MG capsule  Commonly known as:  PRILOSEC  Take 20 mg by mouth daily as needed (for acid reflex).      oxyCODONE 5 MG immediate release tablet  Commonly known as:  Oxy IR/ROXICODONE  Take 1 tablet (5 mg total) by mouth every 4 (four) hours as needed for severe pain.     polyethylene glycol packet  Commonly known as:  MIRALAX / GLYCOLAX  Take 17 g by mouth daily as needed for mild constipation.     potassium chloride SA 20 MEQ tablet  Commonly known as:  K-DUR,KLOR-CON  Take 1 tablet (20 mEq total) by mouth 2 (two)  times daily.     predniSONE 5 MG tablet  Commonly known as:  DELTASONE  Take 15 mg by mouth daily with breakfast.     rOPINIRole 0.5 MG tablet  Commonly known as:  REQUIP  Take 0.5 mg by mouth 2 (two) times daily.     tamsulosin 0.4 MG Caps capsule  Commonly known as:  FLOMAX  Take 0.4 mg by mouth at bedtime.     temazepam 15 MG capsule  Commonly known as:  RESTORIL  Take 15 mg by mouth at bedtime.     torsemide 20 MG tablet  Commonly known as:  DEMADEX  Take 20 mg by mouth 2 (two) times daily.     trifluoperazine 5 MG tablet  Commonly known as:  STELAZINE  Take 5 mg by mouth 2 (two) times daily as needed (for itching).            Follow-up Information    Follow up with [name], MD.   Specialty:  [facility]   Contact information:   [address] [place] [address] [phone]       Call Debbora Presto, MD.   Specialty:  Internal Medicine   Why:  As needed call my cell phone [phone]   Contact information:   [address] [place] [address] [phone]        The results of significant diagnostics from this hospitalization (including imaging, microbiology, ancillary and laboratory) are listed below for reference.     Microbiology: Recent Results (from the past 240 hour(s))  Gram stain     Status: None   Collection Time: 01/21/15  4:41 AM  Result Value Ref Range Status   Specimen Description STOOL  Final   Special Requests NONE  Final   Gram Stain   Final    NO WBC SEEN ABUNDANT GRAM NEGATIVE  RODS MODERATE GRAM POSITIVE COCCI IN CHAINS IN CLUSTERS MODERATE GRAM POSITIVE RODS RARE BUDDING YEAST SEEN    Report Status 01/22/2015 FINAL  Final  Urine culture     Status: None   Collection Time: 01/21/15  4:30 PM  Result Value Ref Range Status   Specimen Description URINE, CLEAN CATCH  Final   Special Requests NONE  Final   Culture NO GROWTH 1 DAY  Final   Report Status 01/22/2015 FINAL  Final  Blood Culture (routine x 2)     Status: None   Collection Time: 01/21/15  4:40 PM  Result Value Ref Range Status   Specimen Description BLOOD RIGHT HAND  Final   Special Requests BOTTLES DRAWN AEROBIC ONLY 5CC  Final   Culture NO GROWTH 5 DAYS  Final   Report Status 01/26/2015 FINAL  Final  Clostridium Difficile by PCR (not at [facility])     Status: None   Collection Time: 01/21/15  4:45 PM  Result Value Ref Range Status   C difficile by pcr NEGATIVE NEGATIVE Final  Blood Culture (routine x 2)     Status: None   Collection Time: 01/21/15  5:18 PM  Result Value Ref Range Status   Specimen Description BLOOD RIGHT FOREARM  Final   Special Requests BOTTLES DRAWN AEROBIC AND ANAEROBIC 5CC  Final   Culture NO GROWTH 5 DAYS  Final   Report Status 01/26/2015 FINAL  Final  MRSA PCR Screening     Status: None   Collection Time: 01/21/15  7:34 PM  Result Value Ref Range Status   MRSA by PCR NEGATIVE NEGATIVE Final  Comment:        The GeneXpert MRSA Assay (FDA approved for NASAL specimens only), is one component of a comprehensive MRSA colonization surveillance program. It is not intended to diagnose MRSA infection nor to guide or monitor treatment for MRSA infections.      Labs: Basic Metabolic Panel:  Recent Labs Lab 01/24/15 0433 01/26/15 0559 01/27/15 0257 01/28/15 0518 01/29/15 0606  NA 141 140 137 137 137  K 3.8 3.6 3.2* 3.9 3.5  CL 105 105 103 103 105  CO2 25 23 21* 23 22  GLUCOSE 126* 119* 111* 108* 109*  BUN 12 8 11 11 10   CREATININE 0.61 0.75 0.64 0.62 0.56   CALCIUM 9.3 9.2 9.0 9.8 9.2   CBC:  Recent Labs Lab 01/23/15 0225 01/26/15 0559 01/27/15 0257 01/28/15 0518 01/29/15 0606  WBC 11.6* 14.0* 14.4* 16.2* 14.9*  HGB 11.8* 13.8 13.0 14.5 14.3  HCT 37.0 41.3 39.0 42.1 41.5  MCV 87.9 84.8 84.2 84.5 83.8  PLT 215 265 274 316 297    BNP (last 3 results)  Recent Labs  01/21/15 1648  BNP 99.7    CBG:  Recent Labs Lab 01/28/15 0844 01/28/15 1245 01/28/15 1607 01/28/15 2235 01/29/15 0803  GLUCAP 106* 141* 250* 170* 110*     SIGNED: Time coordinating discharge: 30 minutes  MAGICK-Tomika Eckles, MD  [facility] 01/29/2015, 10:28 AM Pager [phone]  If 7PM-7AM, please contact night-coverage [email or web address] Password [email or web address]

## 2015-04-17 ENCOUNTER — Other Ambulatory Visit: Payer: Self-pay

## 2015-04-17 ENCOUNTER — Encounter (HOSPITAL_COMMUNITY): Payer: Self-pay | Admitting: *Deleted

## 2015-04-17 ENCOUNTER — Emergency Department (HOSPITAL_COMMUNITY): Payer: Medicare Other

## 2015-04-17 ENCOUNTER — Inpatient Hospital Stay (HOSPITAL_COMMUNITY): Payer: Medicare Other

## 2015-04-17 ENCOUNTER — Inpatient Hospital Stay (HOSPITAL_COMMUNITY)
Admission: EM | Admit: 2015-04-17 | Discharge: 2015-04-20 | DRG: 871 | Disposition: A | Discharge status: Home / Self Care | Payer: Medicare Other | Attending: Internal Medicine | Admitting: Internal Medicine

## 2015-04-17 DIAGNOSIS — G8250 Quadriplegia, unspecified: Secondary | ICD-10-CM | POA: Diagnosis present

## 2015-04-17 DIAGNOSIS — J9602 Acute respiratory failure with hypercapnia: Secondary | ICD-10-CM | POA: Diagnosis present

## 2015-04-17 DIAGNOSIS — I5032 Chronic diastolic (congestive) heart failure: Secondary | ICD-10-CM | POA: Diagnosis present

## 2015-04-17 DIAGNOSIS — G8929 Other chronic pain: Secondary | ICD-10-CM | POA: Diagnosis not present

## 2015-04-17 DIAGNOSIS — G894 Chronic pain syndrome: Secondary | ICD-10-CM | POA: Diagnosis not present

## 2015-04-17 DIAGNOSIS — Z87891 Personal history of nicotine dependence: Secondary | ICD-10-CM

## 2015-04-17 DIAGNOSIS — Z7982 Long term (current) use of aspirin: Secondary | ICD-10-CM

## 2015-04-17 DIAGNOSIS — K219 Gastro-esophageal reflux disease without esophagitis: Secondary | ICD-10-CM | POA: Diagnosis present

## 2015-04-17 DIAGNOSIS — A419 Sepsis, unspecified organism: Secondary | ICD-10-CM | POA: Diagnosis not present

## 2015-04-17 DIAGNOSIS — G9341 Metabolic encephalopathy: Secondary | ICD-10-CM | POA: Diagnosis present

## 2015-04-17 DIAGNOSIS — I110 Hypertensive heart disease with heart failure: Secondary | ICD-10-CM | POA: Diagnosis present

## 2015-04-17 DIAGNOSIS — R0602 Shortness of breath: Secondary | ICD-10-CM | POA: Diagnosis not present

## 2015-04-17 DIAGNOSIS — R410 Disorientation, unspecified: Secondary | ICD-10-CM

## 2015-04-17 DIAGNOSIS — N390 Urinary tract infection, site not specified: Secondary | ICD-10-CM | POA: Diagnosis not present

## 2015-04-17 DIAGNOSIS — I10 Essential (primary) hypertension: Secondary | ICD-10-CM | POA: Diagnosis present

## 2015-04-17 DIAGNOSIS — G8220 Paraplegia, unspecified: Secondary | ICD-10-CM

## 2015-04-17 DIAGNOSIS — Z79899 Other long term (current) drug therapy: Secondary | ICD-10-CM | POA: Diagnosis not present

## 2015-04-17 DIAGNOSIS — R0902 Hypoxemia: Secondary | ICD-10-CM

## 2015-04-17 DIAGNOSIS — E785 Hyperlipidemia, unspecified: Secondary | ICD-10-CM | POA: Diagnosis present

## 2015-04-17 DIAGNOSIS — J9601 Acute respiratory failure with hypoxia: Secondary | ICD-10-CM | POA: Diagnosis present

## 2015-04-17 LAB — COMPREHENSIVE METABOLIC PANEL
ALK PHOS: 85 U/L (ref 38–126)
ALT: 39 U/L (ref 14–54)
AST: 39 U/L (ref 15–41)
Albumin: 3.7 g/dL (ref 3.5–5.0)
Anion gap: 12 (ref 5–15)
BUN: 33 mg/dL — AB (ref 6–20)
CHLORIDE: 92 mmol/L — AB (ref 101–111)
CO2: 34 mmol/L — ABNORMAL HIGH (ref 22–32)
CREATININE: 0.88 mg/dL (ref 0.44–1.00)
Calcium: 9.3 mg/dL (ref 8.9–10.3)
GFR calc Af Amer: >60 mL/min (ref >60)
Glucose, Bld: 237 mg/dL — ABNORMAL HIGH (ref 65–99)
Potassium: 4.3 mmol/L (ref 3.5–5.1)
Sodium: 138 mmol/L (ref 135–145)
Total Bilirubin: 0.5 mg/dL (ref 0.3–1.2)
Total Protein: 6.9 g/dL (ref 6.5–8.1)

## 2015-04-17 LAB — I-STAT CG4 LACTIC ACID, ED
LACTIC ACID, VENOUS: 4.97 mmol/L — AB (ref 0.5–2.0)
Lactic Acid, Venous: 5.28 mmol/L (ref 0.5–2.0)

## 2015-04-17 LAB — URINE MICROSCOPIC-ADD ON

## 2015-04-17 LAB — BRAIN NATRIURETIC PEPTIDE: B Natriuretic Peptide: 77.7 pg/mL (ref 0.0–100.0)

## 2015-04-17 LAB — TROPONIN I

## 2015-04-17 LAB — CBC WITH DIFFERENTIAL/PLATELET
Basophils Absolute: 0.1 10*3/uL (ref 0.0–0.1)
Basophils Relative: 0 %
EOS PCT: 1 %
Eosinophils Absolute: 0.3 10*3/uL (ref 0.0–0.7)
HCT: 47.1 % — ABNORMAL HIGH (ref 36.0–46.0)
HEMOGLOBIN: 15 g/dL (ref 12.0–15.0)
LYMPHS ABS: 3.5 10*3/uL (ref 0.7–4.0)
LYMPHS PCT: 16 %
MCH: 29 pg (ref 26.0–34.0)
MCHC: 31.8 g/dL (ref 30.0–36.0)
MCV: 90.9 fL (ref 78.0–100.0)
Monocytes Absolute: 1.2 10*3/uL — ABNORMAL HIGH (ref 0.1–1.0)
Monocytes Relative: 5 %
NEUTROS PCT: 78 %
Neutro Abs: 17.6 10*3/uL — ABNORMAL HIGH (ref 1.7–7.7)
Platelets: 273 10*3/uL (ref 150–400)
RBC: 5.18 MIL/uL — ABNORMAL HIGH (ref 3.87–5.11)
RDW: 14.1 % (ref 11.5–15.5)
WBC: 22.7 10*3/uL — ABNORMAL HIGH (ref 4.0–10.5)

## 2015-04-17 LAB — URINALYSIS, ROUTINE W REFLEX MICROSCOPIC
Bilirubin Urine: NEGATIVE
GLUCOSE, UA: NEGATIVE mg/dL
Ketones, ur: NEGATIVE mg/dL
Nitrite: NEGATIVE
Protein, ur: NEGATIVE mg/dL
SPECIFIC GRAVITY, URINE: 1.012 (ref 1.005–1.030)
Urobilinogen, UA: 0.2 mg/dL (ref 0.0–1.0)
pH: 7 (ref 5.0–8.0)

## 2015-04-17 LAB — LACTIC ACID, PLASMA
LACTIC ACID, VENOUS: 4.4 mmol/L — AB (ref 0.5–2.0)
Lactic Acid, Venous: 5.7 mmol/L (ref 0.5–2.0)

## 2015-04-17 MED ORDER — IOHEXOL 350 MG/ML SOLN
100.0000 mL | Freq: Once | INTRAVENOUS | Status: AC | PRN
  Administered 2015-04-17: 100 mL via INTRAVENOUS

## 2015-04-17 MED ORDER — ENOXAPARIN SODIUM 40 MG/0.4ML ~~LOC~~ SOLN
40.0000 mg | SUBCUTANEOUS | Status: DC
  Administered 2015-04-17 – 2015-04-19 (×3): 40 mg via SUBCUTANEOUS
  Filled 2015-04-17 (×3): qty 0.4

## 2015-04-17 MED ORDER — PIPERACILLIN-TAZOBACTAM 3.375 G IVPB 30 MIN
3.3750 g | INTRAVENOUS | Status: AC
  Administered 2015-04-17: 3.375 g via INTRAVENOUS
  Filled 2015-04-17: qty 50

## 2015-04-17 MED ORDER — ACETAMINOPHEN 650 MG RE SUPP
650.0000 mg | Freq: Four times a day (QID) | RECTAL | Status: DC | PRN
  Filled 2015-04-17: qty 1

## 2015-04-17 MED ORDER — SODIUM CHLORIDE 0.9 % IJ SOLN
3.0000 mL | Freq: Two times a day (BID) | INTRAMUSCULAR | Status: DC
  Administered 2015-04-20: 3 mL via INTRAVENOUS

## 2015-04-17 MED ORDER — ALBUTEROL SULFATE (2.5 MG/3ML) 0.083% IN NEBU
5.0000 mg | INHALATION_SOLUTION | Freq: Once | RESPIRATORY_TRACT | Status: AC
  Administered 2015-04-17: 5 mg via RESPIRATORY_TRACT
  Filled 2015-04-17: qty 6

## 2015-04-17 MED ORDER — MAGNESIUM OXIDE 400 (241.3 MG) MG PO TABS
400.0000 mg | ORAL_TABLET | Freq: Two times a day (BID) | ORAL | Status: DC
  Administered 2015-04-17 – 2015-04-20 (×6): 400 mg via ORAL
  Filled 2015-04-17 (×6): qty 1

## 2015-04-17 MED ORDER — EZETIMIBE 10 MG PO TABS
10.0000 mg | ORAL_TABLET | Freq: Every morning | ORAL | Status: DC
  Administered 2015-04-18 – 2015-04-20 (×3): 10 mg via ORAL
  Filled 2015-04-17 (×3): qty 1

## 2015-04-17 MED ORDER — GABAPENTIN 300 MG PO CAPS
300.0000 mg | ORAL_CAPSULE | Freq: Three times a day (TID) | ORAL | Status: DC
  Administered 2015-04-17 – 2015-04-20 (×9): 300 mg via ORAL
  Filled 2015-04-17 (×9): qty 1

## 2015-04-17 MED ORDER — CHLORPHENIRAMINE-PSE-IBUPROFEN 2-30-200 MG PO TABS: 1.0000 | ORAL_TABLET | Freq: Every day | ORAL | Status: DC | PRN

## 2015-04-17 MED ORDER — IPRATROPIUM BROMIDE 0.02 % IN SOLN
0.5000 mg | Freq: Once | RESPIRATORY_TRACT | Status: AC
  Administered 2015-04-17: 0.5 mg via RESPIRATORY_TRACT
  Filled 2015-04-17: qty 2.5

## 2015-04-17 MED ORDER — ACETAMINOPHEN 325 MG PO TABS
650.0000 mg | ORAL_TABLET | Freq: Four times a day (QID) | ORAL | Status: DC | PRN
  Administered 2015-04-17 – 2015-04-20 (×5): 650 mg via ORAL
  Filled 2015-04-17 (×5): qty 2

## 2015-04-17 MED ORDER — TRAZODONE HCL 50 MG PO TABS
50.0000 mg | ORAL_TABLET | Freq: Every day | ORAL | Status: DC
  Administered 2015-04-17 – 2015-04-19 (×3): 50 mg via ORAL
  Filled 2015-04-17 (×3): qty 1

## 2015-04-17 MED ORDER — PREDNISONE 5 MG PO TABS
15.0000 mg | ORAL_TABLET | Freq: Every day | ORAL | Status: DC
  Administered 2015-04-18 – 2015-04-20 (×3): 15 mg via ORAL
  Filled 2015-04-17 (×3): qty 3

## 2015-04-17 MED ORDER — ASPIRIN EC 81 MG PO TBEC
81.0000 mg | DELAYED_RELEASE_TABLET | Freq: Every morning | ORAL | Status: DC
  Administered 2015-04-18 – 2015-04-20 (×3): 81 mg via ORAL
  Filled 2015-04-17 (×3): qty 1

## 2015-04-17 MED ORDER — LORAZEPAM 0.5 MG PO TABS: 0.5000 mg | ORAL_TABLET | Freq: Two times a day (BID) | ORAL | Status: DC | PRN

## 2015-04-17 MED ORDER — SODIUM CHLORIDE 0.9 % IV SOLN
INTRAVENOUS | Status: DC
  Administered 2015-04-17 – 2015-04-18 (×3): via INTRAVENOUS

## 2015-04-17 MED ORDER — PANTOPRAZOLE SODIUM 40 MG PO TBEC
40.0000 mg | DELAYED_RELEASE_TABLET | Freq: Every day | ORAL | Status: DC
  Administered 2015-04-18 – 2015-04-20 (×3): 40 mg via ORAL
  Filled 2015-04-17 (×3): qty 1

## 2015-04-17 MED ORDER — SODIUM CHLORIDE 0.9 % IV BOLUS (SEPSIS)
500.0000 mL | Freq: Once | INTRAVENOUS | Status: AC
  Administered 2015-04-18: 500 mL via INTRAVENOUS

## 2015-04-17 MED ORDER — PIPERACILLIN-TAZOBACTAM 3.375 G IVPB
3.3750 g | Freq: Three times a day (TID) | INTRAVENOUS | Status: DC
  Administered 2015-04-17 – 2015-04-19 (×6): 3.375 g via INTRAVENOUS
  Filled 2015-04-17 (×6): qty 50

## 2015-04-17 MED ORDER — TAMSULOSIN HCL 0.4 MG PO CAPS
0.4000 mg | ORAL_CAPSULE | Freq: Every day | ORAL | Status: DC
  Administered 2015-04-17 – 2015-04-19 (×3): 0.4 mg via ORAL
  Filled 2015-04-17 (×3): qty 1

## 2015-04-17 MED ORDER — NALOXONE HCL 0.4 MG/ML IJ SOLN
0.4000 mg | Freq: Once | INTRAMUSCULAR | Status: AC
  Administered 2015-04-17: 0.4 mg via INTRAVENOUS
  Filled 2015-04-17: qty 1

## 2015-04-17 MED ORDER — POLYETHYLENE GLYCOL 3350 17 G PO PACK: 17.0000 g | PACK | Freq: Every day | ORAL | Status: DC | PRN

## 2015-04-17 MED ORDER — SODIUM CHLORIDE 0.9 % IV BOLUS (SEPSIS)
500.0000 mL | Freq: Once | INTRAVENOUS | Status: AC
  Administered 2015-04-17: 500 mL via INTRAVENOUS

## 2015-04-17 MED ORDER — CALCIUM CARBONATE-VITAMIN D 500-200 MG-UNIT PO TABS
1.0000 | ORAL_TABLET | Freq: Every day | ORAL | Status: DC
  Administered 2015-04-18 – 2015-04-20 (×3): 1 via ORAL
  Filled 2015-04-17 (×3): qty 1

## 2015-04-17 MED ORDER — PROPRANOLOL HCL 10 MG PO TABS
20.0000 mg | ORAL_TABLET | Freq: Three times a day (TID) | ORAL | Status: DC
  Administered 2015-04-17 – 2015-04-20 (×9): 20 mg via ORAL
  Filled 2015-04-17 (×9): qty 2

## 2015-04-17 NOTE — ED Notes (Signed)
Dr notified of critical Istat result

## 2015-04-17 NOTE — ED Notes (Signed)
Bed: WA06 Expected date:  Expected time:  Means of arrival:  Comments: EMS sob

## 2015-04-17 NOTE — Progress Notes (Signed)
Follow up orders received. SRP, RN

## 2015-04-17 NOTE — Progress Notes (Signed)
Pt on Auto CPAP 5-20 CMH20 via FFM with 2 LPM 02 bleed in.  Pt tolerating well at this time, RT to monitor and assess as needed.

## 2015-04-17 NOTE — ED Notes (Signed)
DR notified of critical i stat result

## 2015-04-17 NOTE — ED Notes (Signed)
Per EMS, the patient reports onset of productive cough and SOB last evening, which worsened this AM. EMS states they noted her room O2 sats actively dropping from 92 to 88%, and noted she began wheezing. They administered a neb treatment which was in process upon arrival. Per EMS she denies respiratory hx of COPD, asthma, or other except a sudden onset of ARDS 1 month ago for which she was admitted. The patient presents alert, but with difficulty answering questions.

## 2015-04-17 NOTE — ED Notes (Signed)
Attempted to obtain urine sample by using a bedpan; per the patient she does not have any control over her bladder and uses depens. She reports in the past I&O catheters have been utilized when needing a sterile sample. Discussed with Dr. Rubin Payor.

## 2015-04-17 NOTE — Progress Notes (Signed)
NP informed of Lactic acid result of 4.4.SRP, RN

## 2015-04-17 NOTE — ED Notes (Addendum)
Pt can go at 15:15

## 2015-04-17 NOTE — Progress Notes (Signed)
CRITICAL VALUE ALERT  Critical value received:  Lactic acid 5.7  Date of notification:  04/17/2015  Time of notification:  1906  Critical value read back:Yes.    Nurse who received alert:  Kathee Delton RN   MD notified (1st page):  Craige Cotta NP  Time of first page:  1952  MD notified (2nd page):  Time of second page:  Responding MD:  Craige Cotta NP  Time MD responded:  2010

## 2015-04-17 NOTE — ED Provider Notes (Signed)
CSN: [id number]     Arrival date & time 04/17/15  0941 History   First MD Initiated Contact with Patient 04/17/15 [phone]     Chief Complaint  Patient presents with  . Shortness of Breath    hx of ARDS hospitalization 1 month ago, productive cough onset last night, acute SOB this AM    Level V caveat due to altered mental status (Consider location/radiation/quality/duration/timing/severity/associated sxs/prior Treatment) Patient is a 69 y.o. female presenting with shortness of breath. The history is provided by the patient and the EMS personnel.  Shortness of Breath  patient was reportedly brought in for shortness of breath. Wheezing for EMS. Reportedly had been complaining of some pain but would not quantify. Upon arrival to the ER she will open her eyes to stimulation but will not speak. She will not follow commands for me. Oxygen level was initially in the upper 80s but now on nasal cannula is in the 90s. Pupils are constricted.  Past Medical History  Diagnosis Date  . GERD (gastroesophageal reflux disease)   . Quadriplegia (HCC)   . Hypertension   . Pressure ulcer of foot   . Chronic pain disorder   . Hyperlipidemia   . UTI (urinary tract infection)     reoccuring  . Occasional tremors    Past Surgical History  Procedure Laterality Date  . Back surgery  08/2013  . Abdominal hysterectomy     Family History  Problem Relation Age of Onset  . Adopted: Yes   Social History  Substance Use Topics  . Smoking status: Former Smoker -- 0.25 packs/day    Types: Cigarettes  . Smokeless tobacco: Never Used  . Alcohol Use: No   OB History    No data available     Review of Systems  Unable to perform ROS Respiratory: Positive for shortness of breath.       Allergies  Decadrol; Decadron; and Sodium phosphate  Home Medications   Prior to Admission medications   Medication Sig Start Date End Date Taking? Authorizing Provider  aspirin EC 81 MG tablet Take 81 mg by mouth  every morning.    Yes Historical Provider, MD  baclofen (LIORESAL) 10 MG tablet Take 10 mg by mouth 3 (three) times daily.   Yes Historical Provider, MD  calcium-vitamin D (OSCAL WITH D) 500-200 MG-UNIT per tablet Take 1 tablet by mouth daily with breakfast.   Yes Historical Provider, MD  Chlorpheniramine-PSE-Ibuprofen (ADVIL ALLERGY SINUS) 2-30-200 MG TABS Take 1 tablet by mouth daily as needed (for allergies).   Yes Historical Provider, MD  ezetimibe (ZETIA) 10 MG tablet Take 10 mg by mouth every morning.    Yes Historical Provider, MD  gabapentin (NEURONTIN) 300 MG capsule Take 300 mg by mouth 3 (three) times daily.   Yes Historical Provider, MD  Ibuprofen-Diphenhydramine Cit (ADVIL PM) 200-38 MG TABS Take 1-2 tablets by mouth at bedtime as needed (for pain and sleep).   Yes Historical Provider, MD  LORazepam (ATIVAN) 1 MG tablet Take 0.5 tablets (0.5 mg total) by mouth every 12 (twelve) hours as needed for anxiety. Patient taking differently: Take 1 mg by mouth 3 (three) times daily.  04/18/14  Yes Meredeth Ide, MD  magnesium oxide (MAG-OX) 400 MG tablet Take 400 mg by mouth 2 (two) times daily.   Yes Historical Provider, MD  Multiple Vitamins-Minerals ([employer] SPECTRAVITE ADULT 50+ PO) Take 1 tablet by mouth daily.   Yes Historical Provider, MD  omeprazole (PRILOSEC) 20 MG capsule Take  20 mg by mouth daily as needed (for acid reflex).    Yes Historical Provider, MD  Oxycodone HCl 20 MG TABS Take 20-40 mg by mouth 3 (three) times daily as needed (pain).  04/09/15  Yes Historical Provider, MD  polyethylene glycol (MIRALAX / GLYCOLAX) packet Take 17 g by mouth daily as needed for mild constipation.   Yes Historical Provider, MD  potassium chloride SA (K-DUR,KLOR-CON) 20 MEQ tablet Take 1 tablet (20 mEq total) by mouth 2 (two) times daily. Patient taking differently: Take 20 mEq by mouth 3 (three) times daily.  04/18/14  Yes Meredeth Ide, MD  predniSONE (DELTASONE) 5 MG tablet Take 15 mg by mouth daily  with breakfast.   Yes Historical Provider, MD  propranolol (INDERAL) 20 MG tablet Take 20 mg by mouth 3 (three) times daily. 04/14/15  Yes Historical Provider, MD  rOPINIRole (REQUIP) 0.5 MG tablet Take 0.5 mg by mouth 2 (two) times daily.   Yes Historical Provider, MD  tamsulosin (FLOMAX) 0.4 MG CAPS capsule Take 0.4 mg by mouth at bedtime.    Yes Historical Provider, MD  temazepam (RESTORIL) 15 MG capsule Take 15 mg by mouth at bedtime.   Yes Historical Provider, MD  torsemide (DEMADEX) 20 MG tablet Take 20 mg by mouth 4 (four) times daily.    Yes Historical Provider, MD  traZODone (DESYREL) 50 MG tablet Take 50 mg by mouth at bedtime. 04/14/15  Yes Historical Provider, MD  trifluoperazine (STELAZINE) 5 MG tablet Take 5 mg by mouth 2 (two) times daily as needed (for itching).   Yes Historical Provider, MD  oxyCODONE (OXY IR/ROXICODONE) 5 MG immediate release tablet Take 1 tablet (5 mg total) by mouth every 4 (four) hours as needed for severe pain. Patient not taking: Reported on 04/17/2015 01/29/15   Dorothea Ogle, MD   BP 116/73 mmHg  Pulse 96  Temp(Src) 99.5 F (37.5 C) (Oral)  Resp 22  SpO2 96% Physical Exam  Constitutional: She appears well-developed.  HENT:  Head: Atraumatic.  Eyes:  Pupils are constricted  Cardiovascular: Normal rate.   Pulmonary/Chest:  Somewhat slow respiratory rate and diffuse wheezes.  Abdominal: She exhibits distension. She exhibits no mass.  Musculoskeletal: She exhibits edema.  Edema to bilateral lower legs, worse on left.  Neurological:  Will open eyes to stimulation but will not speak for me or follow commands.  Skin: Skin is warm.    ED Course  Procedures (including critical care time) Labs Review Labs Reviewed  COMPREHENSIVE METABOLIC PANEL - Abnormal; Notable for the following:    Chloride 92 (*)    CO2 34 (*)    Glucose, Bld 237 (*)    BUN 33 (*)    All other components within normal limits  CBC WITH DIFFERENTIAL/PLATELET - Abnormal;  Notable for the following:    WBC 22.7 (*)    RBC 5.18 (*)    HCT 47.1 (*)    Neutro Abs 17.6 (*)    Monocytes Absolute 1.2 (*)    All other components within normal limits  URINALYSIS, ROUTINE W REFLEX MICROSCOPIC (NOT AT [facility]) - Abnormal; Notable for the following:    APPearance TURBID (*)    Hgb urine dipstick MODERATE (*)    Leukocytes, UA LARGE (*)    All other components within normal limits  URINE MICROSCOPIC-ADD ON - Abnormal; Notable for the following:    Bacteria, UA MANY (*)    All other components within normal limits  I-STAT CG4 LACTIC ACID,  ED - Abnormal; Notable for the following:    Lactic Acid, Venous 5.28 (*)    All other components within normal limits  I-STAT CG4 LACTIC ACID, ED - Abnormal; Notable for the following:    Lactic Acid, Venous 4.97 (*)    All other components within normal limits  URINE CULTURE  TROPONIN I  BRAIN NATRIURETIC PEPTIDE    Imaging Review Ct Angio Chest Pe W/cm &/or Wo Cm  04/17/2015   CLINICAL DATA:  Shortness of breath and hypoxia  EXAM: CT ANGIOGRAPHY CHEST WITH CONTRAST  TECHNIQUE: Multidetector CT imaging of the chest was performed using the standard protocol during bolus administration of intravenous contrast. Multiplanar CT image reconstructions and MIPs were obtained to evaluate the vascular anatomy.  CONTRAST:  OMNIPAQUE IOHEXOL 350 MG/ML SOLN  COMPARISON:  None.  FINDINGS: The lungs are well aerated bilaterally. Minimal atelectatic changes/ scarring is noted in the the lower lobes bilaterally. No focal confluent infiltrate is seen. A vague ground-glass nodule is seen measuring approximately 7 mm in greatest dimension. It lies within the right upper lobe and is best visualized on image number 36 of series 12. It appears to have tiny solid focus within measuring 1-2 mm. Some other smaller scattered ground-glass changes are noted within the lower lobe on the right.  The thoracic inlet is within normal limits. Mild aortic  calcifications are noted without aneurysmal dilatation or dissection. Mild coronary calcifications are noted as well. No significant hilar or mediastinal adenopathy is noted. Pulmonary artery demonstrates a normal branching pattern. No intraluminal filling defect to suggest pulmonary embolism is seen.  The upper abdomen shows diffuse fatty infiltration of the liver. No other focal abnormality is seen. No bony abnormality is noted.  Review of the MIP images confirms the above findings.  IMPRESSION: No evidence of pulmonary embolism.  Vague ground-glass nodular densities in the right upper and right lower lobes as described. Follow-up CT in 3 months is recommended to assess for stability. If the lesions persist annual surveillance for 3 years is recommended.  Fatty liver.   Electronically Signed   By: Alcide Clever M.D.   On: 04/17/2015 15:54   Dg Chest Portable 1 View  04/17/2015   CLINICAL DATA:  Short of breath.  Cough.  EXAM: PORTABLE CHEST 1 VIEW  COMPARISON:  01/22/2015  FINDINGS: Cardiac enlargement without heart failure. Mild bibasilar atelectasis. Negative for pneumonia  IMPRESSION: Mild bibasilar atelectasis. Negative for heart failure or pneumonia.   Electronically Signed   By: Marlan Palau M.D.   On: 04/17/2015 10:29   I have personally reviewed and evaluated these images and lab results as part of my medical decision-making.   EKG Interpretation None      MDM   Final diagnoses:  Sepsis, due to unspecified organism [facility])  Urinary tract infection without hematuria, site unspecified  Hypoxia    Patient with altered mental status and hypoxia. Mental status had improved after IV Narcan. She is on chronic pain medicines. She did however have somewhat continued hypoxia. Required oxygen. Lactic acid is elevated. Not hypotensive febrile but does have an apparent recurrent urinary tract infection. Will admit to internal medicine. CT scan of the chest done to rule out pulmonary embolism with  immobility and recent hospitalization is risk factors.  CRITICAL CARE Performed by: Billee Cashing Total critical care time: 30 Critical care time was exclusive of separately billable procedures and treating other patients. Critical care was necessary to treat or prevent imminent or life-threatening deterioration.  Critical care was time spent personally by me on the following activities: development of treatment plan with patient and/or surrogate as well as nursing, discussions with consultants, evaluation of patient's response to treatment, examination of patient, obtaining history from patient or surrogate, ordering and performing treatments and interventions, ordering and review of laboratory studies, ordering and review of radiographic studies, pulse oximetry and re-evaluation of patient's condition.      Benjiman Core, MD 04/17/15 1600

## 2015-04-17 NOTE — ED Notes (Signed)
Patient answering questions post narcan administration.

## 2015-04-17 NOTE — Progress Notes (Signed)
ANTIBIOTIC CONSULT NOTE - INITIAL  Pharmacy Consult for zosyn Indication: sepsis  Allergies  Allergen Reactions  . Decadrol [Dexamethasone] Swelling    Swelling of face and stomach   . Decadron [Dexamethasone] Swelling  . Sodium Phosphate Swelling    Swelling of face and stomach     Patient Measurements:   Adjusted Body Weight:   Vital Signs: Temp: 98.7 F (37.1 C) (10/11 1329) Temp Source: Oral (10/11 1329) BP: 116/73 mmHg (10/11 1329) Pulse Rate: 69 (10/11 1329) Intake/Output from previous day:   Intake/Output from this shift:    Labs:  Recent Labs  04/17/15 1043  WBC 22.7*  HGB 15.0  PLT 273  CREATININE 0.88   CrCl cannot be calculated (Unknown ideal weight.). No results for input(s): VANCOTROUGH, VANCOPEAK, VANCORANDOM, GENTTROUGH, GENTPEAK, GENTRANDOM, TOBRATROUGH, TOBRAPEAK, TOBRARND, AMIKACINPEAK, AMIKACINTROU, AMIKACIN in the last 72 hours.   Microbiology: No results found for this or any previous visit (from the past 720 hour(s)).  Medical History: Past Medical History  Diagnosis Date  . GERD (gastroesophageal reflux disease)   . Quadriplegia (HCC)   . Hypertension   . Pressure ulcer of foot   . Chronic pain disorder   . Hyperlipidemia   . UTI (urinary tract infection)     reoccuring  . Occasional tremors    Assessment: 69 YOF presents with cough and shortness of breath.  Pharmacy asked to dose zosyn for sepsis.  10/11 >>pip/tazo >>  10/11 urine:    Renal: SCr WNL (quadriplegia is listed in PMH - thus SCr may not accurately reflect actual renal fx) WBC elevated afebrile  Dose changes/levels:   Goal of Therapy:  Dose for indication and for patient-specific parameters   Plan:  Zosyn 3.375gm IV over x 1 in ED then q8h over 4h infusion  Dannielle Huh 04/17/2015,1:34 PM

## 2015-04-17 NOTE — H&P (Signed)
[facility] History and Physical  [name] [name] BJY:[id number] DOB: 1946/01/09 DOA: 04/17/2015  Referring physician:  PCP: [name], MD  Specialists:   Chief Complaint:   HPI: [name] [name] is a 69 y.o. female with PMH of HTN, Chronic Pain, Quadriplegia, h/o sepsis/uti presented with shortness of breath, hypoxia, encephalopathy. Patient is poor historian. Per EMS and ED physician: Patient desat to 69's which resolved on oxygen nasal cannula 2 lpm. Patient had constricted pupils with encephalopathy, ED suspected possible opioid overdose. she was given narcan in ED which improved her encephalopathy. Patient denies nausea, vomiting. No acute chest pains.  -ED: patient found to have UTI, sepsis. WBC-22k. hemodynamically stable. CTA chest is pend   Review of Systems: The patient denies anorexia, fever, weight loss,, vision loss, decreased hearing, hoarseness, chest pain, syncope, dyspnea on exertion, peripheral edema, balance deficits, hemoptysis, abdominal pain, melena, hematochezia, severe indigestion/heartburn, hematuria, incontinence, genital sores, muscle weakness, suspicious skin lesions, transient blindness, difficulty walking, depression, unusual weight change, abnormal bleeding, enlarged lymph nodes, angioedema, and breast masses.    Past Medical History  Diagnosis Date  . GERD (gastroesophageal reflux disease)   . Quadriplegia (HCC)   . Hypertension   . Pressure ulcer of foot   . Chronic pain disorder   . Hyperlipidemia   . UTI (urinary tract infection)     reoccuring  . Occasional tremors    Past Surgical History  Procedure Laterality Date  . Back surgery  08/2013  . Abdominal hysterectomy     Social History:  reports that she has quit smoking. Her smoking use included Cigarettes. She smoked 0.25 packs per day. She has never used smokeless tobacco. She reports that she does not drink alcohol or use illicit drugs. Home;  where does patient live--home, ALF, SNF? and  with whom if at home? No;  Can patient participate in ADLs?  Allergies  Allergen Reactions  . Decadrol [Dexamethasone] Swelling    Swelling of face and stomach   . Decadron [Dexamethasone] Swelling  . Sodium Phosphate Swelling    Swelling of face and stomach     Family History  Problem Relation Age of Onset  . Adopted: Yes    (be sure to complete)  Prior to Admission medications   Medication Sig Start Date End Date Taking? Authorizing Provider  aspirin EC 81 MG tablet Take 81 mg by mouth every morning.    Yes Historical Provider, MD  baclofen (LIORESAL) 10 MG tablet Take 10 mg by mouth 3 (three) times daily.   Yes Historical Provider, MD  calcium-vitamin D (OSCAL WITH D) 500-200 MG-UNIT per tablet Take 1 tablet by mouth daily with breakfast.   Yes Historical Provider, MD  Chlorpheniramine-PSE-Ibuprofen (ADVIL ALLERGY SINUS) 2-30-200 MG TABS Take 1 tablet by mouth daily as needed (for allergies).   Yes Historical Provider, MD  ezetimibe (ZETIA) 10 MG tablet Take 10 mg by mouth every morning.    Yes Historical Provider, MD  gabapentin (NEURONTIN) 300 MG capsule Take 300 mg by mouth 3 (three) times daily.   Yes Historical Provider, MD  Ibuprofen-Diphenhydramine Cit (ADVIL PM) 200-38 MG TABS Take 1-2 tablets by mouth at bedtime as needed (for pain and sleep).   Yes Historical Provider, MD  LORazepam (ATIVAN) 1 MG tablet Take 0.5 tablets (0.5 mg total) by mouth every 12 (twelve) hours as needed for anxiety. Patient taking differently: Take 1 mg by mouth 3 (three) times daily.  04/18/14  Yes Meredeth Ide, MD  magnesium oxide (MAG-OX) 400 MG  tablet Take 400 mg by mouth 2 (two) times daily.   Yes Historical Provider, MD  Multiple Vitamins-Minerals ([employer] SPECTRAVITE ADULT 50+ PO) Take 1 tablet by mouth daily.   Yes Historical Provider, MD  omeprazole (PRILOSEC) 20 MG capsule Take 20 mg by mouth daily as needed (for acid reflex).    Yes Historical Provider, MD  Oxycodone HCl 20 MG TABS Take 20-40  mg by mouth 3 (three) times daily as needed (pain).  04/09/15  Yes Historical Provider, MD  polyethylene glycol (MIRALAX / GLYCOLAX) packet Take 17 g by mouth daily as needed for mild constipation.   Yes Historical Provider, MD  potassium chloride SA (K-DUR,KLOR-CON) 20 MEQ tablet Take 1 tablet (20 mEq total) by mouth 2 (two) times daily. Patient taking differently: Take 20 mEq by mouth 3 (three) times daily.  04/18/14  Yes Meredeth Ide, MD  predniSONE (DELTASONE) 5 MG tablet Take 15 mg by mouth daily with breakfast.   Yes Historical Provider, MD  propranolol (INDERAL) 20 MG tablet Take 20 mg by mouth 3 (three) times daily. 04/14/15  Yes Historical Provider, MD  rOPINIRole (REQUIP) 0.5 MG tablet Take 0.5 mg by mouth 2 (two) times daily.   Yes Historical Provider, MD  tamsulosin (FLOMAX) 0.4 MG CAPS capsule Take 0.4 mg by mouth at bedtime.    Yes Historical Provider, MD  temazepam (RESTORIL) 15 MG capsule Take 15 mg by mouth at bedtime.   Yes Historical Provider, MD  torsemide (DEMADEX) 20 MG tablet Take 20 mg by mouth 4 (four) times daily.    Yes Historical Provider, MD  traZODone (DESYREL) 50 MG tablet Take 50 mg by mouth at bedtime. 04/14/15  Yes Historical Provider, MD  trifluoperazine (STELAZINE) 5 MG tablet Take 5 mg by mouth 2 (two) times daily as needed (for itching).   Yes Historical Provider, MD  oxyCODONE (OXY IR/ROXICODONE) 5 MG immediate release tablet Take 1 tablet (5 mg total) by mouth every 4 (four) hours as needed for severe pain. Patient not taking: Reported on 04/17/2015 01/29/15   Dorothea Ogle, MD   Physical Exam: Filed Vitals:   04/17/15 1329  BP: 116/73  Pulse: 69  Temp: 98.7 F (37.1 C)  Resp: 19     General:  Alert, but confused   Eyes: eom-i  ENT: no oral ulcers   Neck: supple, no JVD   Cardiovascular: s1,s2 rrr  Respiratory: no wheezing   Abdomen: soft, distended, nt   Skin: erythema   Musculoskeletal: no leg edema   Psychiatric: no hallucinations    Neurologic: quadriplegia, alert. Confused  Labs on Admission:  Basic Metabolic Panel:  Recent Labs Lab 04/17/15 1043  NA 138  K 4.3  CL 92*  CO2 34*  GLUCOSE 237*  BUN 33*  CREATININE 0.88  CALCIUM 9.3   Liver Function Tests:  Recent Labs Lab 04/17/15 1043  AST 39  ALT 39  ALKPHOS 85  BILITOT 0.5  PROT 6.9  ALBUMIN 3.7   No results for input(s): LIPASE, AMYLASE in the last 168 hours. No results for input(s): AMMONIA in the last 168 hours. CBC:  Recent Labs Lab 04/17/15 1043  WBC 22.7*  NEUTROABS 17.6*  HGB 15.0  HCT 47.1*  MCV 90.9  PLT 273   Cardiac Enzymes:  Recent Labs Lab 04/17/15 1043  TROPONINI <0.03    BNP (last 3 results)  Recent Labs  01/21/15 1648 04/17/15 1043  BNP 99.7 77.7    ProBNP (last 3 results) No results for  input(s): PROBNP in the last 8760 hours.  CBG: No results for input(s): GLUCAP in the last 168 hours.  Radiological Exams on Admission: Dg Chest Portable 1 View  04/17/2015   CLINICAL DATA:  Short of breath.  Cough.  EXAM: PORTABLE CHEST 1 VIEW  COMPARISON:  01/22/2015  FINDINGS: Cardiac enlargement without heart failure. Mild bibasilar atelectasis. Negative for pneumonia  IMPRESSION: Mild bibasilar atelectasis. Negative for heart failure or pneumonia.   Electronically Signed   By: Marlan Palau M.D.   On: 04/17/2015 10:29    EKG: Independently reviewed.   Assessment/Plan Active Problems:   UTI (lower urinary tract infection)   Metabolic encephalopathy  69 y.o. female with PMH of HTN, CHF, Diastolic Dysfunction, Chronic Pain, Quadriplegia, h/o sepsis/uti presented with shortness of breath, hypoxia, encephalopathy.  -ED: patient found to have UTI, sepsis. WBC-22k. hemodynamically stable. CTA chest is pend   1. Sepsis/UTI. Started on IV atx, pend cultures. hemodynamically stable. IVF as needed. F/u labs  2. Acute hypoxic respiratory failure suspected due to hypoventilation with opioid overdose. Hypoxia  ->resolved after narcan and with oxygen 2 Lpm. CXR: no clear infiltrates. we will f/u CTA chest ordered in ED. Cont oxygen.  3. Acute encephalopathy likely due to UTI+opioids. Unclear baseline mental status. I called family but no answer. Patient apparently has chronic quadriplegia. -encephalopathy is improving after narcan. We will cont to monitor. Obtain head CT. Hold opioids, requip, benadryl, temazepam, baclofen, ativan as needed for today, and resume in AM to prevent withdrawals  4. Chronic pain. We will hold opioids today due to suspected overdose. Restart as needed 5. CHF, Diastolic Dysfunction. Patient is septic. No s/s of fluid overload. Hold diuretics, +potassium while on IVF. Resume diuresis as needed in AM     None;  if consultant consulted, please document name and whether formally or informally consulted  Code Status: full (must indicate code status--if unknown or must be presumed, indicate so) Family Communication:  D/w patient, ED physician. Called her family no answer. will try again  (indicate person spoken with, if applicable, with phone number if by telephone) Disposition Plan: pend clinical improvement  (indicate anticipated LOS)  Time spent: >45 minutes   Esperanza Sheets [facility] Pager [phone]  If 7PM-7AM, please contact night-coverage [email or web address] Password [email or web address] 04/17/2015, 2:14 PM

## 2015-04-18 LAB — BASIC METABOLIC PANEL
ANION GAP: 10 (ref 5–15)
BUN: 26 mg/dL — ABNORMAL HIGH (ref 6–20)
CALCIUM: 8.5 mg/dL — AB (ref 8.9–10.3)
CO2: 33 mmol/L — AB (ref 22–32)
Chloride: 93 mmol/L — ABNORMAL LOW (ref 101–111)
Creatinine, Ser: 0.78 mg/dL (ref 0.44–1.00)
Glucose, Bld: 204 mg/dL — ABNORMAL HIGH (ref 65–99)
POTASSIUM: 3.5 mmol/L (ref 3.5–5.1)
Sodium: 136 mmol/L (ref 135–145)

## 2015-04-18 LAB — CBC
HCT: 40.5 % (ref 36.0–46.0)
HEMOGLOBIN: 13.2 g/dL (ref 12.0–15.0)
MCH: 29.2 pg (ref 26.0–34.0)
MCHC: 32.6 g/dL (ref 30.0–36.0)
MCV: 89.6 fL (ref 78.0–100.0)
Platelets: 220 10*3/uL (ref 150–400)
RBC: 4.52 MIL/uL (ref 3.87–5.11)
RDW: 14.4 % (ref 11.5–15.5)
WBC: 13.6 10*3/uL — ABNORMAL HIGH (ref 4.0–10.5)

## 2015-04-18 LAB — LACTIC ACID, PLASMA: LACTIC ACID, VENOUS: 3.5 mmol/L — AB (ref 0.5–2.0)

## 2015-04-18 NOTE — Progress Notes (Signed)
Pt has refused CPAP for the night, Pt to notify RT if she decides to wear.  RT to monitor and assess as needed.

## 2015-04-18 NOTE — Progress Notes (Signed)
[facility] PROGRESS NOTE  [name] [name] WUJ:[id number] DOB: 05-Nov-1945 DOA: 04/17/2015 PCP: [name], MD  Assessment/Plan: 1. Acute encephalopathy -Likely secondary to combination of polypharmacy and urinary tract infection. -She is on multiple psychotropic medications including baclofen, gabapentin, lorazepam, oxycodone, Restoril, trazodone. Her son had reported that her Ativan dose was recently increased to 4 times daily. -I suspect this contributed to hypoxemia seen on admission. -On today's evaluation she appears better she is awake and alert, with her son who is present at bedside confirming improvement. -Her Ativan dose has been decreased to twice daily as needed, with the discontinuation of Restoril ,gabapentin and baclofen for now.  2.  Urinary tract infection. -She presents with acute encephalopathy associate with a fever having temperature 100.7 on admission. -Urinalysis showed the presence of many bacteria along with leukocytes.] -Continue IV Zosyn for now, follow-up on urine and blood cultures.  3.  Sepsis -Present on admission, evidenced by a white count of 22,700, acute encephalopathy, temperature 100.7 -Source of infection likely urinary tract infection. -She has a history of recurrent urinary tract infections. Will continue IV Zosyn, follow-up on urine and blood cultures.  4.  Acute hypercarbic respiratory failure -Evidenced by ABG showing a PCO2 of 69, having an increased oxygen requirement on admission. -I be secondary to oversedation from multiple psychotropic medications.   5.  Hypertension. -Blood pressure stable with systolic blood pressures in the 120s to 140s -She is on propranolol 20 mg by mouth 3 times a day   Code Status: Full code Family Communication: I spoke her son was present at bedside Disposition Plan:    Antibiotics:  Zosyn  HPI/Subjective: Patient is a pleasant 69 year old female with a past medical history of paraplegia,  hypertension, chronic pain syndrome, admitted to the medicine service on 04/17/2015 when she presented with mental status changes and hypoxemia. Polypharmacy felt to contribute to her mental status changes as she had been on baclofen, gabapentin, Ativan, oxycodone, Restoril, and trazodone. She was also found to have urinary tract infection with the presence of bacteria and leukocyte Estrace and urinalysis. She had a temperature 100.7.  Objective: Filed Vitals:   04/18/15 1407  BP: 137/63  Pulse: 84  Temp: 99 F (37.2 C)  Resp: 18    Intake/Output Summary (Last 24 hours) at 04/18/15 1824 Last data filed at 04/18/15 1407  Gross per 24 hour  Intake   1200 ml  Output      0 ml  Net   1200 ml   Filed Weights   04/17/15 1601  Weight: 84.188 kg (185 lb 9.6 oz)    Exam:   General:  Patient is awake and alert, more oriented. Able to follow commands tell me where she is.  Cardiovascular: Regular rate and rhythm normal S1-S2 no murmurs or gallops  Respiratory: Normal respiratory effort lungs are clear to auscultation  Abdomen: Soft nontender nondistended  Musculoskeletal: Bilateral lower extremity paralysis, anatomical foot deformities  Data Reviewed: Basic Metabolic Panel:  Recent Labs Lab 04/17/15 1043 04/18/15 0300  NA 138 136  K 4.3 3.5  CL 92* 93*  CO2 34* 33*  GLUCOSE 237* 204*  BUN 33* 26*  CREATININE 0.88 0.78  CALCIUM 9.3 8.5*   Liver Function Tests:  Recent Labs Lab 04/17/15 1043  AST 39  ALT 39  ALKPHOS 85  BILITOT 0.5  PROT 6.9  ALBUMIN 3.7   No results for input(s): LIPASE, AMYLASE in the last 168 hours. No results for input(s): AMMONIA in the last 168 hours.  CBC:  Recent Labs Lab 04/17/15 1043 04/18/15 0300  WBC 22.7* 13.6*  NEUTROABS 17.6*  --   HGB 15.0 13.2  HCT 47.1* 40.5  MCV 90.9 89.6  PLT 273 220   Cardiac Enzymes:  Recent Labs Lab 04/17/15 1043  TROPONINI <0.03   BNP (last 3 results)  Recent Labs  01/21/15 1648  04/17/15 1043  BNP 99.7 77.7    ProBNP (last 3 results) No results for input(s): PROBNP in the last 8760 hours.  CBG: No results for input(s): GLUCAP in the last 168 hours.  Recent Results (from the past 240 hour(s))  Urine culture     Status: None (Preliminary result)   Collection Time: 04/17/15  1:18 PM  Result Value Ref Range Status   Specimen Description URINE, CATHETERIZED  Final   Special Requests NONE  Final   Culture   Final    >=100,000 COLONIES/mL ESCHERICHIA COLI Per[facility]    Report Status PENDING  Incomplete     Studies: Ct Head Wo Contrast  04/17/2015  CLINICAL DATA:  Encephalopathy, possible opioid overdose, confusion EXAM: CT HEAD WITHOUT CONTRAST TECHNIQUE: Contiguous axial images were obtained from the base of the skull through the vertex without intravenous contrast. COMPARISON:  04/16/2014 FINDINGS: No evidence of parenchymal hemorrhage or extra-axial fluid collection. No mass lesion, mass effect, or midline shift. No CT evidence of acute infarction. Subcortical white matter and periventricular small vessel ischemic changes. Mild intracranial atherosclerosis. Mild cortical atrophy.  No ventriculomegaly. The visualized paranasal sinuses are essentially clear. The mastoid air cells are unopacified. No evidence of calvarial fracture. IMPRESSION: No evidence of acute intracranial abnormality. Atrophy with small vessel ischemic changes. Electronically Signed   By: Charline BillsSriyesh  Krishnan M.D.   On: 04/17/2015 18:36   Ct Angio Chest Pe W/cm &/or Wo Cm  04/17/2015  CLINICAL DATA:  Shortness of breath and hypoxia EXAM: CT ANGIOGRAPHY CHEST WITH CONTRAST TECHNIQUE: Multidetector CT imaging of the chest was performed using the standard protocol during bolus administration of intravenous contrast. Multiplanar CT image reconstructions and MIPs were obtained to evaluate the vascular anatomy. CONTRAST:  100mL OMNIPAQUE IOHEXOL 350 MG/ML SOLN COMPARISON:  None.  FINDINGS: The lungs are well aerated bilaterally. Minimal atelectatic changes/ scarring is noted in the the lower lobes bilaterally. No focal confluent infiltrate is seen. A vague ground-glass nodule is seen measuring approximately 7 mm in greatest dimension. It lies within the right upper lobe and is best visualized on image number 36 of series 12. It appears to have tiny solid focus within measuring 1-2 mm. Some other smaller scattered ground-glass changes are noted within the lower lobe on the right. The thoracic inlet is within normal limits. Mild aortic calcifications are noted without aneurysmal dilatation or dissection. Mild coronary calcifications are noted as well. No significant hilar or mediastinal adenopathy is noted. Pulmonary artery demonstrates a normal branching pattern. No intraluminal filling defect to suggest pulmonary embolism is seen. The upper abdomen shows diffuse fatty infiltration of the liver. No other focal abnormality is seen. No bony abnormality is noted. Review of the MIP images confirms the above findings. IMPRESSION: No evidence of pulmonary embolism. Vague ground-glass nodular densities in the right upper and right lower lobes as described. Follow-up CT in 3 months is recommended to assess for stability. If the lesions persist annual surveillance for 3 years is recommended. Fatty liver. Electronically Signed   By: Alcide CleverMark  Lukens M.D.   On: 04/17/2015 15:54   Dg Chest Portable 1 View  04/17/2015  CLINICAL DATA:  Short of breath.  Cough. EXAM: PORTABLE CHEST 1 VIEW COMPARISON:  01/22/2015 FINDINGS: Cardiac enlargement without heart failure. Mild bibasilar atelectasis. Negative for pneumonia IMPRESSION: Mild bibasilar atelectasis. Negative for heart failure or pneumonia. Electronically Signed   By: Marlan Palau M.D.   On: 04/17/2015 10:29    Scheduled Meds: . aspirin EC  81 mg Oral q morning - 10a  . calcium-vitamin D  1 tablet Oral Q breakfast  . enoxaparin (LOVENOX)  injection  40 mg Subcutaneous Q24H  . ezetimibe  10 mg Oral q morning - 10a  . gabapentin  300 mg Oral TID  . magnesium oxide  400 mg Oral BID  . pantoprazole  40 mg Oral Daily  . piperacillin-tazobactam (ZOSYN)  IV  3.375 g Intravenous 3 times per day  . predniSONE  15 mg Oral Q breakfast  . propranolol  20 mg Oral TID  . sodium chloride  3 mL Intravenous Q12H  . tamsulosin  0.4 mg Oral QHS  . traZODone  50 mg Oral QHS   Continuous Infusions: . sodium chloride 75 mL/hr at 04/18/15 1621    Active Problems:   UTI (lower urinary tract infection)   Metabolic encephalopathy   Sepsis (HCC)    Time spent: 35 min   Jeralyn Bennett  [facility] Pager [phone]. If 7PM-7AM, please contact night-coverage at [email or web address], password [facility] 04/18/2015, 6:24 PM  LOS: 1 day

## 2015-04-18 NOTE — Care Management Note (Signed)
Case Management Note  Patient Details  Name: [name] [name] MRN: [id number] Date of Birth: 04/27/1946  Subjective/Objective:    Pt admitted with UTI, confusion                Action/Plan: Pt plan to DC home with 24/7 hour care   Expected Discharge Date:   (unknown)               Expected Discharge Plan:  Home w Home Health Services  In-House Referral:     Discharge planning Services  CM Consult  Post Acute Care Choice:    Choice offered to:     DME Arranged:    DME Agency:     HH Arranged:    HH Agency:     Status of Service:  In process, will continue to follow  Medicare Important Message Given:    Date Medicare IM Given:    Medicare IM give by:    Date Additional Medicare IM Given:    Additional Medicare Important Message give by:     If discussed at Long Length of Stay Meetings, dates discussed:    Additional CommentsGeni Bers:  Monico Sudduth, RN 04/18/2015, 4:11 PM

## 2015-04-19 DIAGNOSIS — J9602 Acute respiratory failure with hypercapnia: Secondary | ICD-10-CM

## 2015-04-19 LAB — URINE CULTURE: Culture: >=100000

## 2015-04-19 LAB — BASIC METABOLIC PANEL
ANION GAP: 11 (ref 5–15)
BUN: 18 mg/dL (ref 6–20)
CALCIUM: 9 mg/dL (ref 8.9–10.3)
CO2: 32 mmol/L (ref 22–32)
Chloride: 95 mmol/L — ABNORMAL LOW (ref 101–111)
Creatinine, Ser: 0.62 mg/dL (ref 0.44–1.00)
GLUCOSE: 234 mg/dL — AB (ref 65–99)
Potassium: 3.3 mmol/L — ABNORMAL LOW (ref 3.5–5.1)
Sodium: 138 mmol/L (ref 135–145)

## 2015-04-19 LAB — CBC
HCT: 40.5 % (ref 36.0–46.0)
HEMOGLOBIN: 12.9 g/dL (ref 12.0–15.0)
MCH: 28 pg (ref 26.0–34.0)
MCHC: 31.9 g/dL (ref 30.0–36.0)
MCV: 88 fL (ref 78.0–100.0)
Platelets: 226 10*3/uL (ref 150–400)
RBC: 4.6 MIL/uL (ref 3.87–5.11)
RDW: 14.3 % (ref 11.5–15.5)
WBC: 11.7 10*3/uL — ABNORMAL HIGH (ref 4.0–10.5)

## 2015-04-19 MED ORDER — POTASSIUM CHLORIDE CRYS ER 20 MEQ PO TBCR
40.0000 meq | EXTENDED_RELEASE_TABLET | Freq: Once | ORAL | Status: AC
Start: 2015-04-19 — End: 2015-04-19
  Administered 2015-04-19: 40 meq via ORAL
  Filled 2015-04-19: qty 2

## 2015-04-19 MED ORDER — NITROFURANTOIN MONOHYD MACRO 100 MG PO CAPS
100.0000 mg | ORAL_CAPSULE | Freq: Two times a day (BID) | ORAL | Status: DC
Start: 2015-04-19 — End: 2015-04-20
  Administered 2015-04-19 – 2015-04-20 (×2): 100 mg via ORAL
  Filled 2015-04-19 (×3): qty 1

## 2015-04-19 NOTE — Progress Notes (Signed)
Patient continues to refuse nocturnal CPAP/BiPAP. RT will continue to follow.

## 2015-04-19 NOTE — Progress Notes (Signed)
[facility] PROGRESS NOTE  [name] MeekerLinda [name] ZOX:[id number] DOB: 12/22/1945 DOA: 04/17/2015 PCP: [name], Samuel, MD  Assessment/Plan: 1. Acute encephalopathy -Likely secondary to combination of polypharmacy and urinary tract infection. -She is on multiple psychotropic medications including baclofen, gabapentin, lorazepam, oxycodone, Restoril, trazodone. Her son had reported that her Ativan dose was recently increased to 4 times daily. -I suspect this contributed to hypoxemia seen on admission. -On today's evaluation she appears better she is awake and alert, with her son who is present at bedside confirming improvement. -Her Ativan dose has been decreased to twice daily as needed, with the discontinuation of Restoril ,gabapentin and baclofen for now. -On 04/19/2015 she she appears much better, now functioning at her baseline. I spoke to her and her son about benefits of simplifying her medication regimen particularly with psychotropic medications. Will keep Ativan at twice-daily dose and continue holding Restoril gabapentin and baclofen for now.  2.  Urinary tract infection. -She presents with acute encephalopathy associate with a fever having temperature 100.7 on admission. -Urine cultures growing greater than 100,000 colony-forming units of Escherichia coli, organism appears to have resistance to several beta lactams. -We'll discontinue Zosyn and start nitrofurantoin 100 mg by mouth twice a day.  3.  Sepsis -Present on admission, evidenced by a white count of 22,700, acute encephalopathy, temperature 100.7 -Source of infection likely urinary tract infection. -Plan to transition to nitrofurantoin  4.  Acute hypercarbic respiratory failure -Evidenced by ABG showing a PCO2 of 69, having an increased oxygen requirement on admission. -likely secondary to oversedation from multiple psychotropic medications.   5.  Hypertension. -Blood pressure stable with systolic blood pressures in the  120s to 140s -She is on propranolol 20 mg by mouth 3 times a day   Code Status: Full code Family Communication: I spoke her son was present at bedside Disposition Plan: Anticipate discharge home in the next 24 hours   Antibiotics:  Zosyn discontinued on 04/19/2015  Nitrofurantoin started on 04/19/2015  HPI/Subjective: Patient is a pleasant 69 year old female with a past medical history of paraplegia, hypertension, chronic pain syndrome, admitted to the medicine service on 04/17/2015 when she presented with mental status changes and hypoxemia. Polypharmacy felt to contribute to her mental status changes as she had been on baclofen, gabapentin, Ativan, oxycodone, Restoril, and trazodone. She was also found to have urinary tract infection with the presence of bacteria and leukocyte Estrace and urinalysis. She had a temperature 100.7. She shows and again improvements with decreasing her Ativan dose to twice daily dosing and the discontinuation of Restoril, gabapentin, baclofen. Urine cultures grew greater than 100,000 colony-forming units of Escherichia coli. Organism susceptible to nitrofurantoin. On 04/19/2015 service and was discontinued as she was started on nitrofurantoin 100 mg by mouth twice a day   Objective: Filed Vitals:   04/19/15 1308  BP: 149/90  Pulse: 74  Temp: 98.3 F (36.8 C)  Resp: 18    Intake/Output Summary (Last 24 hours) at 04/19/15 1522 Last data filed at 04/19/15 1400  Gross per 24 hour  Intake 4476.25 ml  Output      0 ml  Net 4476.25 ml   Filed Weights   04/17/15 1601  Weight: 84.188 kg (185 lb 9.6 oz)    Exam:   General:  Patient looks well today, continues to show daily improvement.  Cardiovascular: Regular rate and rhythm normal S1-S2 no murmurs or gallops  Respiratory: Normal respiratory effort lungs are clear to auscultation  Abdomen: Soft nontender nondistended  Musculoskeletal: Bilateral lower extremity  paralysis, anatomical foot  deformities  Data Reviewed: Basic Metabolic Panel:  Recent Labs Lab 04/17/15 1043 04/18/15 0300 04/19/15 0428  NA 138 136 138  K 4.3 3.5 3.3*  CL 92* 93* 95*  CO2 34* 33* 32  GLUCOSE 237* 204* 234*  BUN 33* 26* 18  CREATININE 0.88 0.78 0.62  CALCIUM 9.3 8.5* 9.0   Liver Function Tests:  Recent Labs Lab 04/17/15 1043  AST 39  ALT 39  ALKPHOS 85  BILITOT 0.5  PROT 6.9  ALBUMIN 3.7   No results for input(s): LIPASE, AMYLASE in the last 168 hours. No results for input(s): AMMONIA in the last 168 hours. CBC:  Recent Labs Lab 04/17/15 1043 04/18/15 0300 04/19/15 0428  WBC 22.7* 13.6* 11.7*  NEUTROABS 17.6*  --   --   HGB 15.0 13.2 12.9  HCT 47.1* 40.5 40.5  MCV 90.9 89.6 88.0  PLT 273 220 226   Cardiac Enzymes:  Recent Labs Lab 04/17/15 1043  TROPONINI <0.03   BNP (last 3 results)  Recent Labs  01/21/15 1648 04/17/15 1043  BNP 99.7 77.7    ProBNP (last 3 results) No results for input(s): PROBNP in the last 8760 hours.  CBG: No results for input(s): GLUCAP in the last 168 hours.  Recent Results (from the past 240 hour(s))  Urine culture     Status: None   Collection Time: 04/17/15  1:18 PM  Result Value Ref Range Status   Specimen Description URINE, CATHETERIZED  Final   Special Requests NONE  Final   Culture   Final    >=100,000 COLONIES/mL ESCHERICHIA COLI Confirmed Extended Spectrum Beta-Lactamase Producer (ESBL) Performed at [facility]    Report Status 04/19/2015 FINAL  Final   Organism ID, Bacteria ESCHERICHIA COLI  Final      Susceptibility   Escherichia coli - MIC*    AMPICILLIN >=32 RESISTANT Resistant     CEFAZOLIN >=64 RESISTANT Resistant     CEFTRIAXONE >=64 RESISTANT Resistant     CIPROFLOXACIN >=4 RESISTANT Resistant     GENTAMICIN <=1 SENSITIVE Sensitive     IMIPENEM <=0.25 SENSITIVE Sensitive     NITROFURANTOIN <=16 SENSITIVE Sensitive     TRIMETH/SULFA <=20 SENSITIVE Sensitive     AMPICILLIN/SULBACTAM 16  INTERMEDIATE Intermediate     PIP/TAZO 8 SENSITIVE Sensitive     * >=100,000 COLONIES/mL ESCHERICHIA COLI     Studies: Ct Head Wo Contrast  04/17/2015  CLINICAL DATA:  Encephalopathy, possible opioid overdose, confusion EXAM: CT HEAD WITHOUT CONTRAST TECHNIQUE: Contiguous axial images were obtained from the base of the skull through the vertex without intravenous contrast. COMPARISON:  04/16/2014 FINDINGS: No evidence of parenchymal hemorrhage or extra-axial fluid collection. No mass lesion, mass effect, or midline shift. No CT evidence of acute infarction. Subcortical white matter and periventricular small vessel ischemic changes. Mild intracranial atherosclerosis. Mild cortical atrophy.  No ventriculomegaly. The visualized paranasal sinuses are essentially clear. The mastoid air cells are unopacified. No evidence of calvarial fracture. IMPRESSION: No evidence of acute intracranial abnormality. Atrophy with small vessel ischemic changes. Electronically Signed   By: Charline Bills M.D.   On: 04/17/2015 18:36   Ct Angio Chest Pe W/cm &/or Wo Cm  04/17/2015  CLINICAL DATA:  Shortness of breath and hypoxia EXAM: CT ANGIOGRAPHY CHEST WITH CONTRAST TECHNIQUE: Multidetector CT imaging of the chest was performed using the standard protocol during bolus administration of intravenous contrast. Multiplanar CT image reconstructions and MIPs were obtained to evaluate the vascular anatomy. CONTRAST:  OMNIPAQUE IOHEXOL 350 MG/ML SOLN COMPARISON:  None. FINDINGS: The lungs are well aerated bilaterally. Minimal atelectatic changes/ scarring is noted in the the lower lobes bilaterally. No focal confluent infiltrate is seen. A vague ground-glass nodule is seen measuring approximately 7 mm in greatest dimension. It lies within the right upper lobe and is best visualized on image number 36 of series 12. It appears to have tiny solid focus within measuring 1-2 mm. Some other smaller scattered ground-glass changes  are noted within the lower lobe on the right. The thoracic inlet is within normal limits. Mild aortic calcifications are noted without aneurysmal dilatation or dissection. Mild coronary calcifications are noted as well. No significant hilar or mediastinal adenopathy is noted. Pulmonary artery demonstrates a normal branching pattern. No intraluminal filling defect to suggest pulmonary embolism is seen. The upper abdomen shows diffuse fatty infiltration of the liver. No other focal abnormality is seen. No bony abnormality is noted. Review of the MIP images confirms the above findings. IMPRESSION: No evidence of pulmonary embolism. Vague ground-glass nodular densities in the right upper and right lower lobes as described. Follow-up CT in 3 months is recommended to assess for stability. If the lesions persist annual surveillance for 3 years is recommended. Fatty liver. Electronically Signed   By: Alcide Clever M.D.   On: 04/17/2015 15:54    Scheduled Meds: . aspirin EC  81 mg Oral q morning - 10a  . calcium-vitamin D  1 tablet Oral Q breakfast  . enoxaparin (LOVENOX) injection  40 mg Subcutaneous Q24H  . ezetimibe  10 mg Oral q morning - 10a  . gabapentin  300 mg Oral TID  . magnesium oxide  400 mg Oral BID  . pantoprazole  40 mg Oral Daily  . piperacillin-tazobactam (ZOSYN)  IV  3.375 g Intravenous 3 times per day  . predniSONE  15 mg Oral Q breakfast  . propranolol  20 mg Oral TID  . sodium chloride  3 mL Intravenous Q12H  . tamsulosin  0.4 mg Oral QHS  . traZODone  50 mg Oral QHS   Continuous Infusions: . sodium chloride 75 mL/hr at 04/18/15 1621    Active Problems:   UTI (lower urinary tract infection)   Metabolic encephalopathy   Sepsis (HCC)    Time spent: 25 min   Jeralyn Bennett  [facility] Pager [phone]. If 7PM-7AM, please contact night-coverage at [email or web address], password [facility] 04/19/2015, 3:22 PM  LOS: 2 days

## 2015-04-19 NOTE — Progress Notes (Signed)
Inpatient Diabetes Program Recommendations  AACE/ADA: New Consensus Statement on Inpatient Glycemic Control (2015)  Target Ranges:  Prepandial:   less than 140 mg/dL      Peak postprandial:   less than 180 mg/dL (1-2 hours)      Critically ill patients:  140 - 180 mg/dL   Review of Glycemic Control  Diabetes history: DM2 Outpatient Diabetes medications: None Current orders for Inpatient glycemic control: None  Results for Ninfa MeekerSTYERS, Eyanna (MRN [id number]) as of 04/19/2015 16:17  Ref. Range 04/17/2015 10:43 04/18/2015 03:00 04/19/2015 04:28  Glucose Latest Ref Range: 65-99 mg/dL 914237 (H) 782204 (H) 956234 (H)   Results for Ninfa MeekerSTYERS, Heyli (MRN [id number]) as of 04/19/2015 16:17  Ref. Range 01/17/2015 10:30  Hemoglobin A1C Latest Ref Range: 4.8-5.6 % 7.4 (H)   FBS > 200 mg/dL x past 3 days.  Inpatient Diabetes Program Recommendations:    Novolog sensitive tidwc Consider addition of Lantus 8 units QHS. Updated HgbA1C.  Will continue to follow. Thank you. Ailene Ardshonda Keiko Myricks, RD, LDN, CDE Inpatient Diabetes Coordinator [phone]

## 2015-04-19 NOTE — Progress Notes (Signed)
Nutrition Brief Note  Patient identified on the Braden report.  Wt Readings from Last 15 Encounters:  04/17/15 185 lb 9.6 oz (84.188 kg)  01/28/15 188 lb 15.8 oz (85.724 kg)  01/19/15 175 lb 7.8 oz (79.6 kg)  08/27/14 168 lb 6.9 oz (76.4 kg)  04/18/14 160 lb (72.576 kg)  03/29/14 171 lb 4.8 oz (77.7 kg)  03/14/14 165 lb 2 oz (74.9 kg)  06/10/13 173 lb (78.472 kg)    Body mass index is 33.94 kg/(m^2). Patient meets criteria for obesity based on current BMI.   Pt seen for Braden. Pt with wounds, not pressure ulcers, to perineum and heel. Pt has been eating 90-100% since admission. Per chart review, pt has been gaining weight over the past 8 months (17 lbs). Meeting needs currently.   Current diet order is Heart Healthy, patient is consuming approximately 100% of meals at this time. Labs and medications reviewed.   No nutrition interventions warranted at this time. If nutrition issues arise, please consult RD.      Trenton GammonJessica Khara Renaud, RD, LDN Inpatient Clinical Dietitian Pager # [phone] After hours/weekend pager # [phone]

## 2015-04-20 DIAGNOSIS — G894 Chronic pain syndrome: Secondary | ICD-10-CM

## 2015-04-20 MED ORDER — NITROFURANTOIN MONOHYD MACRO 100 MG PO CAPS: 100.0000 mg | ORAL_CAPSULE | Freq: Two times a day (BID) | ORAL | Status: DC

## 2015-04-20 MED ORDER — OXYCODONE HCL 5 MG PO TABS: 5.0000 mg | ORAL_TABLET | Freq: Three times a day (TID) | ORAL | Status: DC | PRN

## 2015-04-20 MED ORDER — TORSEMIDE 20 MG PO TABS
20.0000 mg | ORAL_TABLET | Freq: Every day | ORAL | Status: DC
Start: 2015-04-20 — End: 2015-05-03

## 2015-04-20 NOTE — Progress Notes (Signed)
Dicussed with patient and son discharge instructions, both verbalized agreement and understanding. Patient's IV was discontinued with no complications.  Patient to go down in wheelchair to go home in private vehicle with all belongings.  Morene CrockerKimberly Janeece Blok, RN-BSN, 04/20/2015, [phone]

## 2015-04-20 NOTE — Discharge Summary (Signed)
Physician Discharge Summary  [name] [name]:[id number] DOB: 1946-06-02 DOA: 04/17/2015  PCP: [name], MD  Admit date: 04/17/2015 Discharge date: 04/20/2015  Time spent: 35 minutes  Recommendations for Outpatient Follow-up:  1. Please follow up on medication regimen, she presented with acute encephalopathy in which psychotropic medications likely contributed. She reported taking ativan 4 x daily, was instructed to reduce dose to twuice daily dosing.    Discharge Diagnoses:  Principal Problem:   Metabolic encephalopathy Active Problems:   UTI (lower urinary tract infection)   Sepsis (HCC)   Paraplegia following spinal cord injury (HCC)   Chronic pain disorder   Hypertension   Respiratory failure (HCC)   Discharge Condition: Stable  Diet recommendation: Heart Healthy  Filed Weights   04/17/15 1601  Weight: 84.188 kg (185 lb 9.6 oz)    History of present illness:  [name] [name] is a 69 y.o. female with PMH of HTN, Chronic Pain, Quadriplegia, h/o sepsis/uti presented with shortness of breath, hypoxia, encephalopathy. Patient is poor historian. Per EMS and ED physician: Patient desat to 19's which resolved on oxygen nasal cannula 2 lpm. Patient had constricted pupils with encephalopathy, ED suspected possible opioid overdose. she was given narcan in ED which improved her encephalopathy. Patient denies nausea, vomiting. No acute chest pains.   Hospital Course:  Patient is a pleasant 69 year old female with a past medical history of paraplegia, hypertension, chronic pain syndrome, admitted to the medicine service on 04/17/2015 when she presented with mental status changes and hypoxemia. Polypharmacy felt to contribute to her mental status changes as she had been on baclofen, gabapentin, Ativan, oxycodone, Restoril, and trazodone. She was also found to have urinary tract infection with the presence of bacteria and leukocyte Estrace and urinalysis. She had a temperature 100.7.  She shows and again improvements with decreasing her Ativan dose to twice daily dosing and the discontinuation of Restoril, gabapentin, baclofen. Urine cultures grew greater than 100,000 colony-forming units of Escherichia coli. Organism susceptible to nitrofurantoin. On 04/19/2015 service and was discontinued as she was started on nitrofurantoin 100 mg by mouth twice a day    Acute encephalopathy -Likely secondary to combination of polypharmacy and urinary tract infection. -She is on multiple psychotropic medications including baclofen, gabapentin, lorazepam, oxycodone, Restoril, trazodone. Her son had reported that her Ativan dose was recently increased to 4 times daily. -I suspect this contributed to hypoxemia seen on admission. -She showed gradual clinical improvement during this hospitalization. -Her Ativan dose has been decreased to twice daily as needed, with the discontinuation of Restoril ,gabapentin and baclofen for now. -On 04/20/2015 she she appears much better, now functioning at her baseline. I spoke to her and her son about benefits of simplifying her medication, commanding she decrease her Ativan dose to twice daily dosing at home to speak with her primary care provider if she continues to have symptoms of anxiety.  2. Urinary tract infection. -She presents with acute encephalopathy associate with a fever having temperature 100.7 on admission. -Urine cultures growing greater than 100,000 colony-forming units of Escherichia coli, organism appears to have resistance to several beta lactams. -She was discharged on nitrofurantoin 100 mg by mouth twice a day.  3. Sepsis -Present on admission, evidenced by a white count of 22,700, acute encephalopathy, temperature 100.7 -Source of infection likely urinary tract infection. -Transitioned to nitrofurantoin  4. Acute hypercarbic respiratory failure -Evidenced by ABG showing a PCO2 of 69, having an increased oxygen requirement on  admission. -likely secondary to oversedation from multiple psychotropic medications.  5. Hypertension. -Blood pressure stable with systolic blood pressures in the 120s to 140s -She is on propranolol 20 mg by mouth 3 times a day    Discharge Exam: Filed Vitals:   04/20/15 0439  BP: 169/79  Pulse: 78  Temp: 98.7 F (37.1 C)  Resp: 20    General: Patient looks well today, she has no complaints today states feeling well, anticipating discharge.  Cardiovascular: Regular rate and rhythm normal S1-S2 no murmurs or gallops  Respiratory: Normal respiratory effort lungs are clear to auscultation  Abdomen: Soft nontender nondistended  Musculoskeletal: Bilateral lower extremity paralysis, anatomical foot deformities  Discharge Instructions   Discharge Instructions    (HEART FAILURE PATIENTS) Call MD:  Anytime you have any of the following symptoms: 1) 3 pound weight gain in 24 hours or 5 pounds in 1 week 2) shortness of breath, with or without a dry hacking cough 3) swelling in the hands, feet or stomach 4) if you have to sleep on extra pillows at night in order to breathe.    Complete by:  As directed      Call MD for:  difficulty breathing, headache or visual disturbances    Complete by:  As directed      Call MD for:  extreme fatigue    Complete by:  As directed      Call MD for:  hives    Complete by:  As directed      Call MD for:  persistant dizziness or light-headedness    Complete by:  As directed      Call MD for:  persistant nausea and vomiting    Complete by:  As directed      Call MD for:  redness, tenderness, or signs of infection (pain, swelling, redness, odor or green/yellow discharge around incision site)    Complete by:  As directed      Call MD for:  severe uncontrolled pain    Complete by:  As directed      Call MD for:  temperature >100.4    Complete by:  As directed      Call MD for:    Complete by:  As directed      Diet - low sodium heart healthy     Complete by:  As directed      Increase activity slowly    Complete by:  As directed           Current Discharge Medication List    START taking these medications   Details  nitrofurantoin, macrocrystal-monohydrate, (MACROBID) 100 MG capsule Take 1 capsule (100 mg total) by mouth every 12 (twelve) hours. Qty: 10 capsule, Refills: 0      CONTINUE these medications which have CHANGED   Details  oxyCODONE (OXY IR/ROXICODONE) 5 MG immediate release tablet Take 1 tablet (5 mg total) by mouth every 8 (eight) hours as needed for severe pain. Qty: 30 tablet, Refills: 0    torsemide (DEMADEX) 20 MG tablet Take 1 tablet (20 mg total) by mouth daily. Qty: 30 tablet, Refills: 0      CONTINUE these medications which have NOT CHANGED   Details  aspirin EC 81 MG tablet Take 81 mg by mouth every morning.     baclofen (LIORESAL) 10 MG tablet Take 10 mg by mouth 3 (three) times daily.    calcium-vitamin D (OSCAL WITH D) 500-200 MG-UNIT per tablet Take 1 tablet by mouth daily with breakfast.    ezetimibe (ZETIA) 10 MG  tablet Take 10 mg by mouth every morning.     gabapentin (NEURONTIN) 300 MG capsule Take 300 mg by mouth 3 (three) times daily.    LORazepam (ATIVAN) 1 MG tablet Take 0.5 tablets (0.5 mg total) by mouth every 12 (twelve) hours as needed for anxiety. Qty: 20 tablet, Refills: 0    magnesium oxide (MAG-OX) 400 MG tablet Take 400 mg by mouth 2 (two) times daily.    Multiple Vitamins-Minerals ([employer] SPECTRAVITE ADULT 50+ PO) Take 1 tablet by mouth daily.    omeprazole (PRILOSEC) 20 MG capsule Take 20 mg by mouth daily as needed (for acid reflex).     polyethylene glycol (MIRALAX / GLYCOLAX) packet Take 17 g by mouth daily as needed for mild constipation.    potassium chloride SA (K-DUR,KLOR-CON) 20 MEQ tablet Take 1 tablet (20 mEq total) by mouth 2 (two) times daily.    predniSONE (DELTASONE) 5 MG tablet Take 15 mg by mouth daily with breakfast.    propranolol (INDERAL) 20 MG  tablet Take 20 mg by mouth 3 (three) times daily. Refills: 4    rOPINIRole (REQUIP) 0.5 MG tablet Take 0.5 mg by mouth 2 (two) times daily.    tamsulosin (FLOMAX) 0.4 MG CAPS capsule Take 0.4 mg by mouth at bedtime.     traZODone (DESYREL) 50 MG tablet Take 50 mg by mouth at bedtime. Refills: 5    trifluoperazine (STELAZINE) 5 MG tablet Take 5 mg by mouth 2 (two) times daily as needed (for itching).      STOP taking these medications     Chlorpheniramine-PSE-Ibuprofen (ADVIL ALLERGY SINUS) 2-30-200 MG TABS      Ibuprofen-Diphenhydramine Cit (ADVIL PM) 200-38 MG TABS      temazepam (RESTORIL) 15 MG capsule        Allergies  Allergen Reactions  . Decadrol [Dexamethasone] Swelling    Swelling of face and stomach   . Decadron [Dexamethasone] Swelling  . Sodium Phosphate Swelling    Swelling of face and stomach       The results of significant diagnostics from this hospitalization (including imaging, microbiology, ancillary and laboratory) are listed below for reference.    Significant Diagnostic Studies: Ct Head Wo Contrast  04/17/2015  CLINICAL DATA:  Encephalopathy, possible opioid overdose, confusion EXAM: CT HEAD WITHOUT CONTRAST TECHNIQUE: Contiguous axial images were obtained from the base of the skull through the vertex without intravenous contrast. COMPARISON:  04/16/2014 FINDINGS: No evidence of parenchymal hemorrhage or extra-axial fluid collection. No mass lesion, mass effect, or midline shift. No CT evidence of acute infarction. Subcortical white matter and periventricular small vessel ischemic changes. Mild intracranial atherosclerosis. Mild cortical atrophy.  No ventriculomegaly. The visualized paranasal sinuses are essentially clear. The mastoid air cells are unopacified. No evidence of calvarial fracture. IMPRESSION: No evidence of acute intracranial abnormality. Atrophy with small vessel ischemic changes. Electronically Signed   By: Charline Bills M.D.   On:  04/17/2015 18:36   Ct Angio Chest Pe W/cm &/or Wo Cm  04/17/2015  CLINICAL DATA:  Shortness of breath and hypoxia EXAM: CT ANGIOGRAPHY CHEST WITH CONTRAST TECHNIQUE: Multidetector CT imaging of the chest was performed using the standard protocol during bolus administration of intravenous contrast. Multiplanar CT image reconstructions and MIPs were obtained to evaluate the vascular anatomy. CONTRAST:  OMNIPAQUE IOHEXOL 350 MG/ML SOLN COMPARISON:  None. FINDINGS: The lungs are well aerated bilaterally. Minimal atelectatic changes/ scarring is noted in the the lower lobes bilaterally. No focal confluent infiltrate is seen. A  vague ground-glass nodule is seen measuring approximately 7 mm in greatest dimension. It lies within the right upper lobe and is best visualized on image number 36 of series 12. It appears to have tiny solid focus within measuring 1-2 mm. Some other smaller scattered ground-glass changes are noted within the lower lobe on the right. The thoracic inlet is within normal limits. Mild aortic calcifications are noted without aneurysmal dilatation or dissection. Mild coronary calcifications are noted as well. No significant hilar or mediastinal adenopathy is noted. Pulmonary artery demonstrates a normal branching pattern. No intraluminal filling defect to suggest pulmonary embolism is seen. The upper abdomen shows diffuse fatty infiltration of the liver. No other focal abnormality is seen. No bony abnormality is noted. Review of the MIP images confirms the above findings. IMPRESSION: No evidence of pulmonary embolism. Vague ground-glass nodular densities in the right upper and right lower lobes as described. Follow-up CT in 3 months is recommended to assess for stability. If the lesions persist annual surveillance for 3 years is recommended. Fatty liver. Electronically Signed   By: Alcide Clever M.D.   On: 04/17/2015 15:54   Dg Chest Portable 1 View  04/17/2015  CLINICAL DATA:  Short of  breath.  Cough. EXAM: PORTABLE CHEST 1 VIEW COMPARISON:  01/22/2015 FINDINGS: Cardiac enlargement without heart failure. Mild bibasilar atelectasis. Negative for pneumonia IMPRESSION: Mild bibasilar atelectasis. Negative for heart failure or pneumonia. Electronically Signed   By: Marlan Palau M.D.   On: 04/17/2015 10:29    Microbiology: Recent Results (from the past 240 hour(s))  Urine culture     Status: None   Collection Time: 04/17/15  1:18 PM  Result Value Ref Range Status   Specimen Description URINE, CATHETERIZED  Final   Special Requests NONE  Final   Culture   Final    >=100,000 COLONIES/mL ESCHERICHIA COLI Confirmed Extended Spectrum Beta-Lactamase Producer (ESBL) Performed at [facility]    Report Status 04/19/2015 FINAL  Final   Organism ID, Bacteria ESCHERICHIA COLI  Final      Susceptibility   Escherichia coli - MIC*    AMPICILLIN >=32 RESISTANT Resistant     CEFAZOLIN >=64 RESISTANT Resistant     CEFTRIAXONE >=64 RESISTANT Resistant     CIPROFLOXACIN >=4 RESISTANT Resistant     GENTAMICIN <=1 SENSITIVE Sensitive     IMIPENEM <=0.25 SENSITIVE Sensitive     NITROFURANTOIN <=16 SENSITIVE Sensitive     TRIMETH/SULFA <=20 SENSITIVE Sensitive     AMPICILLIN/SULBACTAM 16 INTERMEDIATE Intermediate     PIP/TAZO 8 SENSITIVE Sensitive     * >=100,000 COLONIES/mL ESCHERICHIA COLI     Labs: Basic Metabolic Panel:  Recent Labs Lab 04/17/15 1043 04/18/15 0300 04/19/15 0428  NA 138 136 138  K 4.3 3.5 3.3*  CL 92* 93* 95*  CO2 34* 33* 32  GLUCOSE 237* 204* 234*  BUN 33* 26* 18  CREATININE 0.88 0.78 0.62  CALCIUM 9.3 8.5* 9.0   Liver Function Tests:  Recent Labs Lab 04/17/15 1043  AST 39  ALT 39  ALKPHOS 85  BILITOT 0.5  PROT 6.9  ALBUMIN 3.7   No results for input(s): LIPASE, AMYLASE in the last 168 hours. No results for input(s): AMMONIA in the last 168 hours. CBC:  Recent Labs Lab 04/17/15 1043 04/18/15 0300 04/19/15 0428  WBC 22.7* 13.6*  11.7*  NEUTROABS 17.6*  --   --   HGB 15.0 13.2 12.9  HCT 47.1* 40.5 40.5  MCV 90.9 89.6 88.0  PLT  273 220 226   Cardiac Enzymes:  Recent Labs Lab 04/17/15 1043  TROPONINI <0.03   BNP: BNP (last 3 results)  Recent Labs  01/21/15 1648 04/17/15 1043  BNP 99.7 77.7    ProBNP (last 3 results) No results for input(s): PROBNP in the last 8760 hours.  CBG: No results for input(s): GLUCAP in the last 168 hours.     SignedJeralyn Bennett  [facility] 04/20/2015, 8:25 AM

## 2015-04-23 ENCOUNTER — Emergency Department (HOSPITAL_COMMUNITY): Payer: Medicare Other

## 2015-04-23 ENCOUNTER — Encounter (HOSPITAL_COMMUNITY): Payer: Self-pay | Admitting: Emergency Medicine

## 2015-04-23 ENCOUNTER — Inpatient Hospital Stay (HOSPITAL_COMMUNITY)
Admission: EM | Admit: 2015-04-23 | Discharge: 2015-04-27 | DRG: 871 | Disposition: A | Discharge status: Home / Self Care | Payer: Medicare Other | Attending: Internal Medicine | Admitting: Internal Medicine

## 2015-04-23 DIAGNOSIS — Z87891 Personal history of nicotine dependence: Secondary | ICD-10-CM

## 2015-04-23 DIAGNOSIS — R197 Diarrhea, unspecified: Secondary | ICD-10-CM | POA: Diagnosis present

## 2015-04-23 DIAGNOSIS — G4700 Insomnia, unspecified: Secondary | ICD-10-CM | POA: Diagnosis present

## 2015-04-23 DIAGNOSIS — Z90710 Acquired absence of both cervix and uterus: Secondary | ICD-10-CM | POA: Diagnosis not present

## 2015-04-23 DIAGNOSIS — Z79899 Other long term (current) drug therapy: Secondary | ICD-10-CM | POA: Diagnosis not present

## 2015-04-23 DIAGNOSIS — Z1612 Extended spectrum beta lactamase (ESBL) resistance: Secondary | ICD-10-CM | POA: Diagnosis present

## 2015-04-23 DIAGNOSIS — Z888 Allergy status to other drugs, medicaments and biological substances status: Secondary | ICD-10-CM

## 2015-04-23 DIAGNOSIS — Z87440 Personal history of urinary (tract) infections: Secondary | ICD-10-CM

## 2015-04-23 DIAGNOSIS — R4182 Altered mental status, unspecified: Secondary | ICD-10-CM

## 2015-04-23 DIAGNOSIS — G9340 Encephalopathy, unspecified: Secondary | ICD-10-CM | POA: Insufficient documentation

## 2015-04-23 DIAGNOSIS — Z7982 Long term (current) use of aspirin: Secondary | ICD-10-CM | POA: Diagnosis not present

## 2015-04-23 DIAGNOSIS — T3695XA Adverse effect of unspecified systemic antibiotic, initial encounter: Secondary | ICD-10-CM | POA: Diagnosis not present

## 2015-04-23 DIAGNOSIS — E872 Acidosis: Secondary | ICD-10-CM | POA: Diagnosis present

## 2015-04-23 DIAGNOSIS — A4151 Sepsis due to Escherichia coli [E. coli]: Secondary | ICD-10-CM | POA: Diagnosis present

## 2015-04-23 DIAGNOSIS — I110 Hypertensive heart disease with heart failure: Secondary | ICD-10-CM | POA: Diagnosis present

## 2015-04-23 DIAGNOSIS — N12 Tubulo-interstitial nephritis, not specified as acute or chronic: Secondary | ICD-10-CM | POA: Diagnosis present

## 2015-04-23 DIAGNOSIS — R251 Tremor, unspecified: Secondary | ICD-10-CM

## 2015-04-23 DIAGNOSIS — F29 Unspecified psychosis not due to a substance or known physiological condition: Secondary | ICD-10-CM | POA: Diagnosis present

## 2015-04-23 DIAGNOSIS — K219 Gastro-esophageal reflux disease without esophagitis: Secondary | ICD-10-CM | POA: Diagnosis present

## 2015-04-23 DIAGNOSIS — I10 Essential (primary) hypertension: Secondary | ICD-10-CM | POA: Diagnosis not present

## 2015-04-23 DIAGNOSIS — R140 Abdominal distension (gaseous): Secondary | ICD-10-CM

## 2015-04-23 DIAGNOSIS — Z79891 Long term (current) use of opiate analgesic: Secondary | ICD-10-CM

## 2015-04-23 DIAGNOSIS — I5032 Chronic diastolic (congestive) heart failure: Secondary | ICD-10-CM | POA: Diagnosis present

## 2015-04-23 DIAGNOSIS — K521 Toxic gastroenteritis and colitis: Secondary | ICD-10-CM | POA: Diagnosis not present

## 2015-04-23 DIAGNOSIS — E1165 Type 2 diabetes mellitus with hyperglycemia: Secondary | ICD-10-CM | POA: Diagnosis present

## 2015-04-23 DIAGNOSIS — E785 Hyperlipidemia, unspecified: Secondary | ICD-10-CM | POA: Diagnosis present

## 2015-04-23 DIAGNOSIS — Z7952 Long term (current) use of systemic steroids: Secondary | ICD-10-CM

## 2015-04-23 DIAGNOSIS — E876 Hypokalemia: Secondary | ICD-10-CM | POA: Diagnosis present

## 2015-04-23 DIAGNOSIS — F419 Anxiety disorder, unspecified: Secondary | ICD-10-CM | POA: Diagnosis present

## 2015-04-23 DIAGNOSIS — R509 Fever, unspecified: Secondary | ICD-10-CM

## 2015-04-23 DIAGNOSIS — G8250 Quadriplegia, unspecified: Secondary | ICD-10-CM | POA: Diagnosis present

## 2015-04-23 DIAGNOSIS — N390 Urinary tract infection, site not specified: Secondary | ICD-10-CM | POA: Diagnosis present

## 2015-04-23 DIAGNOSIS — R258 Other abnormal involuntary movements: Secondary | ICD-10-CM | POA: Diagnosis not present

## 2015-04-23 LAB — COMPREHENSIVE METABOLIC PANEL
ALK PHOS: 68 U/L (ref 38–126)
ALT: 44 U/L (ref 14–54)
AST: 50 U/L — AB (ref 15–41)
Albumin: 3.6 g/dL (ref 3.5–5.0)
Anion gap: 15 (ref 5–15)
BUN: 16 mg/dL (ref 6–20)
CALCIUM: 9.1 mg/dL (ref 8.9–10.3)
CO2: 31 mmol/L (ref 22–32)
CREATININE: 0.66 mg/dL (ref 0.44–1.00)
Chloride: 93 mmol/L — ABNORMAL LOW (ref 101–111)
GFR calc non Af Amer: >60 mL/min (ref >60)
Glucose, Bld: 191 mg/dL — ABNORMAL HIGH (ref 65–99)
Potassium: 3.2 mmol/L — ABNORMAL LOW (ref 3.5–5.1)
Sodium: 139 mmol/L (ref 135–145)
Total Bilirubin: 0.6 mg/dL (ref 0.3–1.2)
Total Protein: 6.6 g/dL (ref 6.5–8.1)

## 2015-04-23 LAB — URINALYSIS, ROUTINE W REFLEX MICROSCOPIC
BILIRUBIN URINE: NEGATIVE
Glucose, UA: NEGATIVE mg/dL
Ketones, ur: NEGATIVE mg/dL
NITRITE: NEGATIVE
PROTEIN: 30 mg/dL — AB
Specific Gravity, Urine: 1.012 (ref 1.005–1.030)
Urobilinogen, UA: 0.2 mg/dL (ref 0.0–1.0)
pH: 5.5 (ref 5.0–8.0)

## 2015-04-23 LAB — I-STAT CG4 LACTIC ACID, ED: Lactic Acid, Venous: 4.53 mmol/L (ref 0.5–2.0)

## 2015-04-23 LAB — CBC
HEMATOCRIT: 45.6 % (ref 36.0–46.0)
Hemoglobin: 14.5 g/dL (ref 12.0–15.0)
MCH: 28.4 pg (ref 26.0–34.0)
MCHC: 31.8 g/dL (ref 30.0–36.0)
MCV: 89.4 fL (ref 78.0–100.0)
Platelets: 259 10*3/uL (ref 150–400)
RBC: 5.1 MIL/uL (ref 3.87–5.11)
RDW: 14.5 % (ref 11.5–15.5)
WBC: 13.6 10*3/uL — ABNORMAL HIGH (ref 4.0–10.5)

## 2015-04-23 LAB — URINE MICROSCOPIC-ADD ON

## 2015-04-23 MED ORDER — ONDANSETRON HCL 4 MG PO TABS: 4.0000 mg | ORAL_TABLET | Freq: Four times a day (QID) | ORAL | Status: DC | PRN

## 2015-04-23 MED ORDER — IMIPENEM-CILASTATIN 250 MG IV SOLR
250.0000 mg | Freq: Four times a day (QID) | INTRAVENOUS | Status: DC
  Administered 2015-04-24 (×2): 250 mg via INTRAVENOUS
  Filled 2015-04-23 (×2): qty 250

## 2015-04-23 MED ORDER — SODIUM CHLORIDE 0.9 % IV SOLN
INTRAVENOUS | Status: DC
  Administered 2015-04-24 – 2015-04-27 (×4): via INTRAVENOUS

## 2015-04-23 MED ORDER — GABAPENTIN 300 MG PO CAPS
300.0000 mg | ORAL_CAPSULE | Freq: Three times a day (TID) | ORAL | Status: DC
  Administered 2015-04-24 – 2015-04-27 (×11): 300 mg via ORAL
  Filled 2015-04-23 (×13): qty 1

## 2015-04-23 MED ORDER — TRAZODONE HCL 50 MG PO TABS
50.0000 mg | ORAL_TABLET | Freq: Every day | ORAL | Status: DC
  Administered 2015-04-24 – 2015-04-26 (×4): 50 mg via ORAL
  Filled 2015-04-23 (×6): qty 1

## 2015-04-23 MED ORDER — ASPIRIN EC 81 MG PO TBEC
81.0000 mg | DELAYED_RELEASE_TABLET | Freq: Every morning | ORAL | Status: DC
  Administered 2015-04-24 – 2015-04-27 (×4): 81 mg via ORAL
  Filled 2015-04-23 (×5): qty 1

## 2015-04-23 MED ORDER — PANTOPRAZOLE SODIUM 40 MG PO TBEC
80.0000 mg | DELAYED_RELEASE_TABLET | Freq: Every day | ORAL | Status: DC
  Administered 2015-04-24 – 2015-04-27 (×4): 80 mg via ORAL
  Filled 2015-04-23 (×4): qty 2

## 2015-04-23 MED ORDER — BACLOFEN 10 MG PO TABS
10.0000 mg | ORAL_TABLET | Freq: Three times a day (TID) | ORAL | Status: DC
  Administered 2015-04-24 – 2015-04-27 (×11): 10 mg via ORAL
  Filled 2015-04-23 (×13): qty 1

## 2015-04-23 MED ORDER — ONDANSETRON HCL 4 MG/2ML IJ SOLN: 4.0000 mg | Freq: Four times a day (QID) | INTRAMUSCULAR | Status: DC | PRN

## 2015-04-23 MED ORDER — SODIUM CHLORIDE 0.9 % IV BOLUS (SEPSIS)
2000.0000 mL | Freq: Once | INTRAVENOUS | Status: AC
  Administered 2015-04-23: 2000 mL via INTRAVENOUS

## 2015-04-23 MED ORDER — TAMSULOSIN HCL 0.4 MG PO CAPS
0.4000 mg | ORAL_CAPSULE | Freq: Every day | ORAL | Status: DC
  Administered 2015-04-24 – 2015-04-26 (×4): 0.4 mg via ORAL
  Filled 2015-04-23 (×5): qty 1

## 2015-04-23 MED ORDER — POTASSIUM CHLORIDE CRYS ER 20 MEQ PO TBCR
20.0000 meq | EXTENDED_RELEASE_TABLET | Freq: Three times a day (TID) | ORAL | Status: DC
  Administered 2015-04-24 (×2): 20 meq via ORAL
  Filled 2015-04-23 (×4): qty 1

## 2015-04-23 MED ORDER — ACETAMINOPHEN 325 MG PO TABS: 650.0000 mg | ORAL_TABLET | Freq: Four times a day (QID) | ORAL | Status: DC | PRN

## 2015-04-23 MED ORDER — LORAZEPAM 0.5 MG PO TABS
0.5000 mg | ORAL_TABLET | Freq: Two times a day (BID) | ORAL | Status: DC | PRN
  Administered 2015-04-24 (×2): 1 mg via ORAL
  Administered 2015-04-25 – 2015-04-26 (×2): 0.5 mg via ORAL
  Administered 2015-04-27: 1 mg via ORAL
  Filled 2015-04-23 (×2): qty 2
  Filled 2015-04-23: qty 1
  Filled 2015-04-23 (×2): qty 2

## 2015-04-23 MED ORDER — ROPINIROLE HCL 0.5 MG PO TABS
0.5000 mg | ORAL_TABLET | Freq: Two times a day (BID) | ORAL | Status: DC
  Administered 2015-04-24 – 2015-04-27 (×8): 0.5 mg via ORAL
  Filled 2015-04-23 (×9): qty 1

## 2015-04-23 MED ORDER — HEPARIN SODIUM (PORCINE) 5000 UNIT/ML IJ SOLN
5000.0000 [IU] | Freq: Three times a day (TID) | INTRAMUSCULAR | Status: DC
  Administered 2015-04-24 – 2015-04-27 (×11): 5000 [IU] via SUBCUTANEOUS
  Filled 2015-04-23 (×14): qty 1

## 2015-04-23 MED ORDER — PROPRANOLOL HCL 20 MG PO TABS
20.0000 mg | ORAL_TABLET | Freq: Three times a day (TID) | ORAL | Status: DC
  Administered 2015-04-24 – 2015-04-27 (×11): 20 mg via ORAL
  Filled 2015-04-23 (×14): qty 1

## 2015-04-23 MED ORDER — PREDNISONE 5 MG PO TABS
15.0000 mg | ORAL_TABLET | Freq: Every day | ORAL | Status: DC
  Administered 2015-04-24 – 2015-04-27 (×4): 15 mg via ORAL
  Filled 2015-04-23 (×5): qty 1

## 2015-04-23 MED ORDER — TRIFLUOPERAZINE HCL 5 MG PO TABS
5.0000 mg | ORAL_TABLET | Freq: Two times a day (BID) | ORAL | Status: DC | PRN
  Filled 2015-04-23: qty 1

## 2015-04-23 MED ORDER — TORSEMIDE 20 MG PO TABS
40.0000 mg | ORAL_TABLET | Freq: Two times a day (BID) | ORAL | Status: DC
  Administered 2015-04-24: 40 mg via ORAL
  Filled 2015-04-23 (×3): qty 2

## 2015-04-23 MED ORDER — SODIUM CHLORIDE 0.9 % IV SOLN
500.0000 mg | Freq: Once | INTRAVENOUS | Status: AC
  Administered 2015-04-23: 500 mg via INTRAVENOUS
  Filled 2015-04-23: qty 500

## 2015-04-23 MED ORDER — OXYCODONE HCL 5 MG PO TABS
5.0000 mg | ORAL_TABLET | ORAL | Status: DC | PRN
  Administered 2015-04-24 – 2015-04-27 (×4): 5 mg via ORAL
  Filled 2015-04-23 (×4): qty 1

## 2015-04-23 MED ORDER — ACETAMINOPHEN 650 MG RE SUPP: 650.0000 mg | Freq: Four times a day (QID) | RECTAL | Status: DC | PRN

## 2015-04-23 MED ORDER — SODIUM CHLORIDE 0.9 % IJ SOLN
3.0000 mL | Freq: Two times a day (BID) | INTRAMUSCULAR | Status: DC
  Administered 2015-04-24 – 2015-04-27 (×6): 3 mL via INTRAVENOUS

## 2015-04-23 MED ORDER — SODIUM CHLORIDE 0.9 % IV BOLUS (SEPSIS)
500.0000 mL | Freq: Once | INTRAVENOUS | Status: AC
  Administered 2015-04-23: 500 mL via INTRAVENOUS

## 2015-04-23 NOTE — ED Provider Notes (Signed)
CSN: [id number]     Arrival date & time 04/23/15  1837 History   First MD Initiated Contact with Patient 04/23/15 1918     Chief Complaint  Patient presents with  . Urinary Tract Infection  . Fever     HPI   Mahoganie Basher is a 69 y.o. female with a PMH of GERD, quadriplegia, HTN, HLD who presents to the ED with fever and altered mental status. She was admitted 10/11 for acute encephalopathy and sepsis thought to be secondary to a UTI. She was discharged Friday on nitrofurantoin. Her son is present at bedside, who states she had a fever to 101.5 today and has been more confused than usual. The patient is a poor historian, and states "I feel fine."   Past Medical History  Diagnosis Date  . GERD (gastroesophageal reflux disease)   . Quadriplegia (HCC)   . Hypertension   . Pressure ulcer of foot   . Chronic pain disorder   . Hyperlipidemia   . UTI (urinary tract infection)     reoccuring  . Occasional tremors    Past Surgical History  Procedure Laterality Date  . Back surgery  08/2013  . Abdominal hysterectomy     Family History  Problem Relation Age of Onset  . Adopted: Yes   Social History  Substance Use Topics  . Smoking status: Former Smoker -- 0.25 packs/day    Types: Cigarettes  . Smokeless tobacco: Never Used  . Alcohol Use: No   OB History    No data available      Review of Systems  Unable to perform ROS: Mental status change (Patient answers "I don't know" to ROS questions.)      Allergies  Decadrol; Decadron; and Sodium phosphate  Home Medications   Prior to Admission medications   Medication Sig Start Date End Date Taking? Authorizing Provider  aspirin EC 81 MG tablet Take 81 mg by mouth every morning.     Historical Provider, MD  baclofen (LIORESAL) 10 MG tablet Take 10 mg by mouth 3 (three) times daily.    Historical Provider, MD  calcium-vitamin D (OSCAL WITH D) 500-200 MG-UNIT per tablet Take 1 tablet by mouth daily with breakfast.     Historical Provider, MD  ezetimibe (ZETIA) 10 MG tablet Take 10 mg by mouth every morning.     Historical Provider, MD  gabapentin (NEURONTIN) 300 MG capsule Take 300 mg by mouth 3 (three) times daily.    Historical Provider, MD  LORazepam (ATIVAN) 1 MG tablet Take 0.5 tablets (0.5 mg total) by mouth every 12 (twelve) hours as needed for anxiety. Patient taking differently: Take 1 mg by mouth 3 (three) times daily.  04/18/14   Meredeth Ide, MD  magnesium oxide (MAG-OX) 400 MG tablet Take 400 mg by mouth 2 (two) times daily.    Historical Provider, MD  Multiple Vitamins-Minerals ([employer] SPECTRAVITE ADULT 50+ PO) Take 1 tablet by mouth daily.    Historical Provider, MD  nitrofurantoin, macrocrystal-monohydrate, (MACROBID) 100 MG capsule Take 1 capsule (100 mg total) by mouth every 12 (twelve) hours. 04/20/15   Jeralyn Bennett, MD  omeprazole (PRILOSEC) 20 MG capsule Take 20 mg by mouth daily as needed (for acid reflex).     Historical Provider, MD  oxyCODONE (OXY IR/ROXICODONE) 5 MG immediate release tablet Take 1 tablet (5 mg total) by mouth every 8 (eight) hours as needed for severe pain. 04/20/15   Jeralyn Bennett, MD  polyethylene glycol (MIRALAX / Ethelene Hal) packet  Take 17 g by mouth daily as needed for mild constipation.    Historical Provider, MD  potassium chloride SA (K-DUR,KLOR-CON) 20 MEQ tablet Take 1 tablet (20 mEq total) by mouth 2 (two) times daily. Patient taking differently: Take 20 mEq by mouth 3 (three) times daily.  04/18/14   Meredeth Ide, MD  predniSONE (DELTASONE) 5 MG tablet Take 15 mg by mouth daily with breakfast.    Historical Provider, MD  propranolol (INDERAL) 20 MG tablet Take 20 mg by mouth 3 (three) times daily. 04/14/15   Historical Provider, MD  rOPINIRole (REQUIP) 0.5 MG tablet Take 0.5 mg by mouth 2 (two) times daily.    Historical Provider, MD  tamsulosin (FLOMAX) 0.4 MG CAPS capsule Take 0.4 mg by mouth at bedtime.     Historical Provider, MD  torsemide (DEMADEX) 20 MG  tablet Take 1 tablet (20 mg total) by mouth daily. 04/20/15   Jeralyn Bennett, MD  traZODone (DESYREL) 50 MG tablet Take 50 mg by mouth at bedtime. 04/14/15   Historical Provider, MD  trifluoperazine (STELAZINE) 5 MG tablet Take 5 mg by mouth 2 (two) times daily as needed (for itching).    Historical Provider, MD    BP 122/64 mmHg  Pulse 93  Temp(Src) 100.5 F (38.1 C) (Oral)  Resp 18  SpO2 87% Physical Exam  Constitutional:  Obese female in no acute distress.  HENT:  Head: Normocephalic and atraumatic.  Right Ear: External ear normal.  Left Ear: External ear normal.  Nose: Nose normal.  Mouth/Throat: Oropharynx is clear and moist.  Eyes: Conjunctivae are normal. Pupils are equal, round, and reactive to light. Right eye exhibits no discharge. Left eye exhibits no discharge. No scleral icterus.  Neck: Normal range of motion. Neck supple.  Cardiovascular: Normal rate, regular rhythm, normal heart sounds and intact distal pulses.   Pulmonary/Chest: Effort normal and breath sounds normal. No respiratory distress. She has no wheezes. She has no rales.  Abdominal: Soft. Bowel sounds are normal. She exhibits distension. She exhibits no mass. There is no tenderness. There is no rebound and no guarding.  Abdomen mildly distended.  Musculoskeletal: Normal range of motion. She exhibits no edema.  Neurological:  Patient appears drowsy. She does not answer orientation questions. Uncooperative with cranial nerve testing. Patient is quadriplegic.   Skin: Skin is warm and dry.  Nursing note and vitals reviewed.   ED Course  Procedures (including critical care time)  Labs Review Labs Reviewed  URINALYSIS, ROUTINE W REFLEX MICROSCOPIC (NOT AT [facility]) - Abnormal; Notable for the following:    APPearance TURBID (*)    Hgb urine dipstick MODERATE (*)    Protein, ur 30 (*)    Leukocytes, UA LARGE (*)    All other components within normal limits  CBC - Abnormal; Notable for the following:    WBC  13.6 (*)    All other components within normal limits  COMPREHENSIVE METABOLIC PANEL - Abnormal; Notable for the following:    Potassium 3.2 (*)    Chloride 93 (*)    Glucose, Bld 191 (*)    AST 50 (*)    All other components within normal limits  URINE MICROSCOPIC-ADD ON - Abnormal; Notable for the following:    Bacteria, UA MANY (*)    All other components within normal limits  I-STAT CG4 LACTIC ACID, ED - Abnormal; Notable for the following:    Lactic Acid, Venous 4.53 (*)    All other components within normal limits  URINE CULTURE  CULTURE, BLOOD (ROUTINE X 2)  CULTURE, BLOOD (ROUTINE X 2)    Imaging Review Dg Chest 2 View  04/23/2015  CLINICAL DATA:  Persistent fever following UTI EXAM: CHEST - 2 VIEW COMPARISON:  04/17/2015 FINDINGS: Cardiac shadow is within normal limits. The lungs are well aerated bilaterally. No focal infiltrate or sizable effusion is seen. No bony abnormality noted. IMPRESSION: No active disease. Electronically Signed   By: Alcide CleverMark  Lukens M.D.   On: 04/23/2015 20:54     I have personally reviewed and evaluated these images and lab results as part of my medical decision-making.   EKG Interpretation None      MDM   Final diagnoses:  Altered mental status, unspecified altered mental status type  Fever, unspecified fever cause    69 year old female presents with fever and AMS. Recently discharged on nitrofurantoin for UTI. Patient is a poor historian and unable to answer ROS questions, stating "I'm fine" and "I don't know."  Patient febrile to 100.5. No hypotension. Heart RRR. Lungs clear to auscultation bilaterally. Abdomen mildly distended. No rebound, guarding, or palpable masses. Patient is quadriplegic. She appears drowsy, does not answer orientation questions, and is uncooperative with neuro exam.   Lactic acid elevated at 4.53. UA remarkable for moderate hemoglobin, large leukocytes. CBC demonstrates leukocytosis with WBC 13.6. CMP with potassium  3.2, AST 50. Urine and blood cultures ordered. CXR negative for active disease.   Will give IV fluids and start imipenem in the ED.  Hospitalist consulted for admission. Spoke with Dr. Konrad DoloresMerrell, who will see the patient in the ED and admit.  BP 126/43 mmHg  Pulse 90  Temp(Src) 100.5 F (38.1 C) (Oral)  Resp 22  SpO2 95%     Mady Gemmalizabeth C Westfall, PA-C 04/24/15 1151  Pricilla LovelessScott Goldston, MD 04/25/15 2352

## 2015-04-23 NOTE — H&P (Signed)
[facility] History and Physical  [name] MeekerLinda Bilotta ZOX:[id number] DOB: 08-Jan-1946 DOA: 04/23/2015  Referring physician: Dr. Merril AbbeGoldston - [facility] PCP: [name], Samuel, MD   Chief Complaint: UTI and fevers  HPI: [name] [name] is a 69 y.o. female  Level 5 caveat: pt poor historian at baseline. History provided by ED physician and son Rosanne AshingJim. Patient unable to provide any history at this time.  Patient was discharged from the hospital on 04/20/2015 after treatment for UTI and acute encephalitis secondary to sepsis. Pt at baseline at that time per family. Urine cultures from the hospitalization showed Escherichia coli ESBL. Patient was discharged on 04/20/2015 with a prescription for nitrofurantoin with which she has been compliant. Pt more sleepy over past couple of days. PO at baseline until day of admission when pt has eaten very little. Patient became febrile again today to 101.5 and became confused. Of note patient has some confusion at baseline but she is conservatively worse than at baseline.  At time of exam patient is only able to follow basic commands and give answers that do not correlate with questions asked.  Review of Systems:  Unable to obtain further review systems.  Past Medical History  Diagnosis Date  . GERD (gastroesophageal reflux disease)   . Quadriplegia (HCC)   . Hypertension   . Pressure ulcer of foot   . Chronic pain disorder   . Hyperlipidemia   . UTI (urinary tract infection)     reoccuring  . Occasional tremors    Past Surgical History  Procedure Laterality Date  . Back surgery  08/2013  . Abdominal hysterectomy     Social History:  reports that she has quit smoking. Her smoking use included Cigarettes. She smoked 0.25 packs per day. She has never used smokeless tobacco. She reports that she does not drink alcohol or use illicit drugs.  Allergies  Allergen Reactions  . Decadrol [Dexamethasone] Swelling    Swelling of face and stomach   . Decadron  [Dexamethasone] Swelling  . Sodium Phosphate Swelling    Swelling of face and stomach     Family History  Problem Relation Age of Onset  . Adopted: Yes     Prior to Admission medications   Medication Sig Start Date End Date Taking? Authorizing Provider  aspirin EC 81 MG tablet Take 81 mg by mouth every morning.    Yes Historical Provider, MD  baclofen (LIORESAL) 10 MG tablet Take 10 mg by mouth 3 (three) times daily.   Yes Historical Provider, MD  calcium-vitamin D (OSCAL WITH D) 500-200 MG-UNIT per tablet Take 1 tablet by mouth daily with breakfast.   Yes Historical Provider, MD  ezetimibe (ZETIA) 10 MG tablet Take 10 mg by mouth every morning.    Yes Historical Provider, MD  gabapentin (NEURONTIN) 300 MG capsule Take 300 mg by mouth 3 (three) times daily.   Yes Historical Provider, MD  LORazepam (ATIVAN) 1 MG tablet Take 0.5 tablets (0.5 mg total) by mouth every 12 (twelve) hours as needed for anxiety. Patient taking differently: Take 1 mg by mouth 3 (three) times daily.  04/18/14  Yes Meredeth IdeGagan S Lama, MD  magnesium oxide (MAG-OX) 400 MG tablet Take 400 mg by mouth 2 (two) times daily.   Yes Historical Provider, MD  Multiple Vitamins-Minerals ([employer] SPECTRAVITE ADULT 50+ PO) Take 1 tablet by mouth daily.   Yes Historical Provider, MD  nitrofurantoin, macrocrystal-monohydrate, (MACROBID) 100 MG capsule Take 1 capsule (100 mg total) by mouth every 12 (twelve) hours. 04/20/15  Yes Jeralyn Bennett, MD  omeprazole (PRILOSEC) 20 MG capsule Take 20 mg by mouth daily as needed (for acid reflex).    Yes Historical Provider, MD  oxyCODONE (OXY IR/ROXICODONE) 5 MG immediate release tablet Take 1 tablet (5 mg total) by mouth every 8 (eight) hours as needed for severe pain. 04/20/15  Yes Jeralyn Bennett, MD  polyethylene glycol (MIRALAX / GLYCOLAX) packet Take 17 g by mouth daily as needed for mild constipation.   Yes Historical Provider, MD  potassium chloride SA (K-DUR,KLOR-CON) 20 MEQ tablet Take 1  tablet (20 mEq total) by mouth 2 (two) times daily. Patient taking differently: Take 20 mEq by mouth 3 (three) times daily.  04/18/14  Yes Meredeth Ide, MD  predniSONE (DELTASONE) 5 MG tablet Take 15 mg by mouth daily with breakfast.   Yes Historical Provider, MD  propranolol (INDERAL) 20 MG tablet Take 20 mg by mouth 3 (three) times daily. 04/14/15  Yes Historical Provider, MD  rOPINIRole (REQUIP) 0.5 MG tablet Take 0.5 mg by mouth 2 (two) times daily.   Yes Historical Provider, MD  tamsulosin (FLOMAX) 0.4 MG CAPS capsule Take 0.4 mg by mouth at bedtime.    Yes Historical Provider, MD  torsemide (DEMADEX) 20 MG tablet Take 1 tablet (20 mg total) by mouth daily. Patient taking differently: Take 40 mg by mouth 2 (two) times daily.  04/20/15  Yes Jeralyn Bennett, MD  traZODone (DESYREL) 50 MG tablet Take 50 mg by mouth at bedtime. 04/14/15  Yes Historical Provider, MD  trifluoperazine (STELAZINE) 5 MG tablet Take 5 mg by mouth 2 (two) times daily as needed (for itching).   Yes Historical Provider, MD   Physical Exam: Filed Vitals:   04/23/15 1857 04/23/15 2103  BP: 122/64 126/43  Pulse: 93 90  Temp: 100.5 F (38.1 C)   TempSrc: Oral   Resp: 18 22  SpO2: 87% 95%    Wt Readings from Last 3 Encounters:  04/17/15 84.188 kg (185 lb 9.6 oz)  01/28/15 85.724 kg (188 lb 15.8 oz)  01/19/15 79.6 kg (175 lb 7.8 oz)    General: Sleepy but arousable. Eyes:  EOMI, normal lids, iris ENT:  grossly normal hearing, lips & tongue Neck:  no LAD, masses or thyromegaly Cardiovascular:  RRR, no m/r/g. trace LE edema.  Respiratory:  CTA bilaterally, no w/r/r. Normal respiratory effort. Abdomen:  soft, ntnd Skin: Small superficial abrasion of the left medial heel Musculoskeletal: Patient is a quadriplegic and unable to move upper and lower esxtremities.  Psychiatric: Patient unable to answer questions appropriately but does follow basic commands. Patient is alert and oriented 0 Neurologic:  CN 2-12  grossly intact, Unable to further evaluate due to mental status          Labs on Admission:  Basic Metabolic Panel:  Recent Labs Lab 04/17/15 1043 04/18/15 0300 04/19/15 0428 04/23/15 1949  NA 138 136 138 139  K 4.3 3.5 3.3* 3.2*  CL 92* 93* 95* 93*  CO2 34* 33* 32 31  GLUCOSE 237* 204* 234* 191*  BUN 33* 26* 18 16  CREATININE 0.88 0.78 0.62 0.66  CALCIUM 9.3 8.5* 9.0 9.1   Liver Function Tests:  Recent Labs Lab 04/17/15 1043 04/23/15 1949  AST 39 50*  ALT 39 44  ALKPHOS 85 68  BILITOT 0.5 0.6  PROT 6.9 6.6  ALBUMIN 3.7 3.6   No results for input(s): LIPASE, AMYLASE in the last 168 hours. No results for input(s): AMMONIA in the last 168 hours.  CBC:  Recent Labs Lab 04/17/15 1043 04/18/15 0300 04/19/15 0428 04/23/15 1949  WBC 22.7* 13.6* 11.7* 13.6*  NEUTROABS 17.6*  --   --   --   HGB 15.0 13.2 12.9 14.5  HCT 47.1* 40.5 40.5 45.6  MCV 90.9 89.6 88.0 89.4  PLT 273 220 226 259   Cardiac Enzymes:  Recent Labs Lab 04/17/15 1043  TROPONINI <0.03    BNP (last 3 results)  Recent Labs  01/21/15 1648 04/17/15 1043  BNP 99.7 77.7    ProBNP (last 3 results) No results for input(s): PROBNP in the last 8760 hours.  CBG: No results for input(s): GLUCAP in the last 168 hours.  Radiological Exams on Admission: Dg Chest 2 View  04/23/2015  CLINICAL DATA:  Persistent fever following UTI EXAM: CHEST - 2 VIEW COMPARISON:  04/17/2015 FINDINGS: Cardiac shadow is within normal limits. The lungs are well aerated bilaterally. No focal infiltrate or sizable effusion is seen. No bony abnormality noted. IMPRESSION: No active disease. Electronically Signed   By: Alcide Clever M.D.   On: 04/23/2015 20:54      Assessment/Plan Active Problems:   UTI (lower urinary tract infection)   Hypertension   Altered mental status   Pyelonephritis   Quadriplegia (HCC)   Chronic diastolic (congestive) heart failure (HCC)   Occasional tremors   Acute Encephalitis: Likely  secondary to UTI. Patient with a sign poor mental function. Confirmed departure from baseline by patient's son who stays with the patient, [name]. Of note patient discharged from previous hospitalization on 04/20/2015 with similar symptoms and diagnoses of UTI. Culture from admission showing Escherichia coli ESBL type. Patient was discharged on nitrofurantoin which culture sensitivities showed that should work. Lactic acid 4.53, UA grossly abnormal, WBC 13.6. - Telemetry - Imipenem - Follow-up UCX  Quadriplegia: chronic. Contractures at baseline.  - PT/OT  - continue baclofen, neurontin  HTN: normotensive - continue propranolol,   Chronic diastolic congestive heart failure: Echo from 2015 showing EF 65% and grade 1 diastolic dysfunction. No evidence of acute decompensation. - Continue torsemide  Insomnia: - Continue trazodone  Psych/Anxiety:  Continue Requip,   GERD: - continue ppi  Tremors:  - continue propranolol  Code Status: FULL  DVT Prophylaxis: Hep Family Communication: Son Disposition Plan:  Pending Improvement    Juandaniel Manfredo Shela Commons, MD Family Medicine [facility] [email or web address] Password [email or web address]

## 2015-04-23 NOTE — ED Notes (Signed)
Bed: WA07 Expected date:  Expected time:  Means of arrival:  Comments: EMS- elderly, fever

## 2015-04-23 NOTE — ED Notes (Signed)
Ruel FavorsJim Poe, son, [phone]

## 2015-04-23 NOTE — Progress Notes (Signed)
ANTIBIOTIC CONSULT NOTE - INITIAL  Pharmacy Consult for primaxin Indication: ESBL UTI  Allergies  Allergen Reactions  . Decadrol [Dexamethasone] Swelling    Swelling of face and stomach   . Decadron [Dexamethasone] Swelling  . Sodium Phosphate Swelling    Swelling of face and stomach     Patient Measurements:    Vital Signs: Temp: 100.5 F (38.1 C) (10/17 1857) Temp Source: Oral (10/17 1857) BP: 122/64 mmHg (10/17 1857) Pulse Rate: 93 (10/17 1857) Intake/Output from previous day:   Intake/Output from this shift:    Labs: No results for input(s): WBC, HGB, PLT, LABCREA, CREATININE in the last 72 hours. Estimated Creatinine Clearance: 65.8 mL/min (by C-G formula based on Cr of 0.62). No results for input(s): VANCOTROUGH, VANCOPEAK, VANCORANDOM, GENTTROUGH, GENTPEAK, GENTRANDOM, TOBRATROUGH, TOBRAPEAK, TOBRARND, AMIKACINPEAK, AMIKACINTROU, AMIKACIN in the last 72 hours.   Microbiology: Recent Results (from the past 720 hour(s))  Urine culture     Status: None   Collection Time: 04/17/15  1:18 PM  Result Value Ref Range Status   Specimen Description URINE, CATHETERIZED  Final   Special Requests NONE  Final   Culture   Final    >=100,000 COLONIES/mL ESCHERICHIA COLI Confirmed Extended Spectrum Beta-Lactamase Producer (ESBL) Performed at [facility]    Report Status 04/19/2015 FINAL  Final   Organism ID, Bacteria ESCHERICHIA COLI  Final      Susceptibility   Escherichia coli - MIC*    AMPICILLIN >=32 RESISTANT Resistant     CEFAZOLIN >=64 RESISTANT Resistant     CEFTRIAXONE >=64 RESISTANT Resistant     CIPROFLOXACIN >=4 RESISTANT Resistant     GENTAMICIN <=1 SENSITIVE Sensitive     IMIPENEM <=0.25 SENSITIVE Sensitive     NITROFURANTOIN <=16 SENSITIVE Sensitive     TRIMETH/SULFA <=20 SENSITIVE Sensitive     AMPICILLIN/SULBACTAM 16 INTERMEDIATE Intermediate     PIP/TAZO 8 SENSITIVE Sensitive     * >=100,000 COLONIES/mL ESCHERICHIA COLI    Medical  History: Past Medical History  Diagnosis Date  . GERD (gastroesophageal reflux disease)   . Quadriplegia (HCC)   . Hypertension   . Pressure ulcer of foot   . Chronic pain disorder   . Hyperlipidemia   . UTI (urinary tract infection)     reoccuring  . Occasional tremors     Assessment: Patient is a 69 y.o paraplegic F who was recently hospitalized and treated with zosyn and discharged on nitrofurantoin (for 5 days) a week ago for ESBL UTI.  She presented to the ED on 10/17 with c/o fever. To start primaxin for ESBL UTI, sepsis.  - Tmax 100.5, scr 0.66 (crcl~66) - LA on 10/17 4.53  10/11 zosyn>>10/13 10/13 nitrof>> (anticipated stop date 10/19) 10/17 primaxin>>  10/11: >100K ESBL ecoli (S=gent, imip, nitrof, zosyn, bactrim)  Goal of Therapy:  Eradication of infection  Plan:  - primaxin 500mg  IV x1, then 250mg  IV q6h  Pedro Whiters P 04/23/2015,8:13 PM

## 2015-04-23 NOTE — ED Notes (Signed)
Pt states she was admitted recently for a UTI, has been taking medications at home but still has a fever. Answering all questions appropriately but is not acting normally per family. Normally bedridden. Alert.

## 2015-04-23 NOTE — ED Notes (Signed)
Attempted to call report to floor, informed floor was not aware a patient was coming to the floor. I mentioned bed as been assigned for over an hour. This Clinical research associatewriter told "nurse was not aware of patient or bed assignment, so you'll have to wait until we call you back".

## 2015-04-23 NOTE — ED Notes (Signed)
Rn and PA made aware of critical lab value.

## 2015-04-24 ENCOUNTER — Inpatient Hospital Stay (HOSPITAL_COMMUNITY): Payer: Medicare Other

## 2015-04-24 LAB — LACTIC ACID, PLASMA
LACTIC ACID, VENOUS: 5.3 mmol/L — AB (ref 0.5–2.0)
Lactic Acid, Venous: 4.5 mmol/L (ref 0.5–2.0)

## 2015-04-24 LAB — BASIC METABOLIC PANEL
ANION GAP: 18 — AB (ref 5–15)
BUN: 11 mg/dL (ref 6–20)
CHLORIDE: 91 mmol/L — AB (ref 101–111)
CO2: 27 mmol/L (ref 22–32)
Calcium: 8.3 mg/dL — ABNORMAL LOW (ref 8.9–10.3)
Creatinine, Ser: 0.66 mg/dL (ref 0.44–1.00)
GFR calc Af Amer: >60 mL/min (ref >60)
GFR calc non Af Amer: >60 mL/min (ref >60)
Glucose, Bld: 464 mg/dL — ABNORMAL HIGH (ref 65–99)
POTASSIUM: 2.7 mmol/L — AB (ref 3.5–5.1)
SODIUM: 136 mmol/L (ref 135–145)

## 2015-04-24 LAB — URINE CULTURE: CULTURE: NO GROWTH

## 2015-04-24 LAB — GLUCOSE, CAPILLARY
GLUCOSE-CAPILLARY: 184 mg/dL — AB (ref 65–99)
Glucose-Capillary: 242 mg/dL — ABNORMAL HIGH (ref 65–99)

## 2015-04-24 LAB — MRSA PCR SCREENING: MRSA by PCR: NEGATIVE

## 2015-04-24 MED ORDER — IOHEXOL 300 MG/ML  SOLN
50.0000 mL | Freq: Once | INTRAMUSCULAR | Status: AC | PRN
  Administered 2015-04-24: 50 mL via ORAL

## 2015-04-24 MED ORDER — SODIUM CHLORIDE 0.9 % IV BOLUS (SEPSIS)
1000.0000 mL | Freq: Once | INTRAVENOUS | Status: AC
  Administered 2015-04-24: 1000 mL via INTRAVENOUS

## 2015-04-24 MED ORDER — POTASSIUM CHLORIDE CRYS ER 20 MEQ PO TBCR
20.0000 meq | EXTENDED_RELEASE_TABLET | Freq: Three times a day (TID) | ORAL | Status: AC
  Administered 2015-04-24 – 2015-04-25 (×3): 20 meq via ORAL
  Filled 2015-04-24: qty 1

## 2015-04-24 MED ORDER — SODIUM CHLORIDE 0.9 % IV SOLN
500.0000 mg | Freq: Three times a day (TID) | INTRAVENOUS | Status: DC
  Administered 2015-04-24 – 2015-04-26 (×5): 500 mg via INTRAVENOUS
  Filled 2015-04-24 (×6): qty 500

## 2015-04-24 MED ORDER — METRONIDAZOLE IN NACL 5-0.79 MG/ML-% IV SOLN
500.0000 mg | Freq: Three times a day (TID) | INTRAVENOUS | Status: DC
  Administered 2015-04-24 – 2015-04-25 (×4): 500 mg via INTRAVENOUS
  Filled 2015-04-24 (×5): qty 100

## 2015-04-24 MED ORDER — INSULIN ASPART 100 UNIT/ML ~~LOC~~ SOLN
0.0000 [IU] | Freq: Three times a day (TID) | SUBCUTANEOUS | Status: DC
  Administered 2015-04-24: 5 [IU] via SUBCUTANEOUS
  Administered 2015-04-25: 8 [IU] via SUBCUTANEOUS
  Administered 2015-04-25: 3 [IU] via SUBCUTANEOUS
  Administered 2015-04-25: 5 [IU] via SUBCUTANEOUS
  Administered 2015-04-26: 8 [IU] via SUBCUTANEOUS
  Administered 2015-04-26: 5 [IU] via SUBCUTANEOUS
  Administered 2015-04-26: 3 [IU] via SUBCUTANEOUS
  Administered 2015-04-27: 2 [IU] via SUBCUTANEOUS
  Administered 2015-04-27: 5 [IU] via SUBCUTANEOUS

## 2015-04-24 MED ORDER — POTASSIUM CHLORIDE 10 MEQ/100ML IV SOLN
10.0000 meq | INTRAVENOUS | Status: AC
  Administered 2015-04-24 (×4): 10 meq via INTRAVENOUS
  Filled 2015-04-24 (×5): qty 100

## 2015-04-24 MED ORDER — SODIUM CHLORIDE 0.9 % IV BOLUS (SEPSIS)
500.0000 mL | Freq: Once | INTRAVENOUS | Status: AC
  Administered 2015-04-24: 500 mL via INTRAVENOUS

## 2015-04-24 MED ORDER — IOHEXOL 300 MG/ML  SOLN
100.0000 mL | Freq: Once | INTRAMUSCULAR | Status: AC | PRN
  Administered 2015-04-24: 100 mL via INTRAVENOUS

## 2015-04-24 NOTE — Progress Notes (Signed)
ANTIBIOTIC CONSULT NOTE - Follow Up  Pharmacy Consult for primaxin Indication: ESBL UTI  Allergies  Allergen Reactions  . Decadrol [Dexamethasone] Swelling    Swelling of face and stomach   . Decadron [Dexamethasone] Swelling  . Sodium Phosphate Swelling    Swelling of face and stomach     Patient Measurements: Height: 5\' 2"  (157.5 cm) Weight: 194 lb 8 oz (88.225 kg) IBW/kg (Calculated) : 50.1  Vital Signs: Temp: 98.4 F (36.9 C) (10/18 0715) Temp Source: Oral (10/18 0715) BP: 136/73 mmHg (10/18 0715) Pulse Rate: 84 (10/18 0715) Intake/Output from previous day: 10/17 0701 - 10/18 0700 In: 3142.5 [I.V.:442.5; IV Piggyback:2700] Out: -  Intake/Output from this shift: Total I/O In: 480 [P.O.:480] Out: -   Labs:  Recent Labs  04/23/15 1949  WBC 13.6*  HGB 14.5  PLT 259  CREATININE 0.66   Estimated Creatinine Clearance: 67.5 mL/min (by C-G formula based on Cr of 0.66). No results for input(s): VANCOTROUGH, VANCOPEAK, VANCORANDOM, GENTTROUGH, GENTPEAK, GENTRANDOM, TOBRATROUGH, TOBRAPEAK, TOBRARND, AMIKACINPEAK, AMIKACINTROU, AMIKACIN in the last 72 hours.   Microbiology: Recent Results (from the past 720 hour(s))  Urine culture     Status: None   Collection Time: 04/17/15  1:18 PM  Result Value Ref Range Status   Specimen Description URINE, CATHETERIZED  Final   Special Requests NONE  Final   Culture   Final    >=100,000 COLONIES/mL ESCHERICHIA COLI Confirmed Extended Spectrum Beta-Lactamase Producer (ESBL) Performed at [facility]     Report Status 04/19/2015 FINAL  Final   Organism ID, Bacteria ESCHERICHIA COLI  Final      Susceptibility   Escherichia coli - MIC*    AMPICILLIN >=32 RESISTANT Resistant     CEFAZOLIN >=64 RESISTANT Resistant     CEFTRIAXONE >=64 RESISTANT Resistant     CIPROFLOXACIN >=4 RESISTANT Resistant     GENTAMICIN <=1 SENSITIVE Sensitive     IMIPENEM <=0.25 SENSITIVE Sensitive     NITROFURANTOIN <=16 SENSITIVE Sensitive      TRIMETH/SULFA <=20 SENSITIVE Sensitive     AMPICILLIN/SULBACTAM 16 INTERMEDIATE Intermediate     PIP/TAZO 8 SENSITIVE Sensitive     * >=100,000 COLONIES/mL ESCHERICHIA COLI  Urine C&S     Status: None (Preliminary result)   Collection Time: 04/23/15  7:40 PM  Result Value Ref Range Status   Specimen Description URINE, CATHETERIZED  Final   Special Requests NONE  Final   Culture   Final    NO GROWTH < 12 HOURS Performed at [facility]     Report Status PENDING  Incomplete  MRSA PCR Screening     Status: None   Collection Time: 04/24/15 12:05 AM  Result Value Ref Range Status   MRSA by PCR NEGATIVE NEGATIVE Final    Comment:        The GeneXpert MRSA Assay (FDA approved for NASAL specimens only), is one component of a comprehensive MRSA colonization surveillance program. It is not intended to diagnose MRSA infection nor to guide or monitor treatment for MRSA infections.     Medical History: Past Medical History  Diagnosis Date  . GERD (gastroesophageal reflux disease)   . Quadriplegia (HCC)   . Hypertension   . Pressure ulcer of foot   . Chronic pain disorder   . Hyperlipidemia   . UTI (urinary tract infection)     reoccuring  . Occasional tremors     Assessment: Patient is a 69 y.o paraplegic F who was recently hospitalized and treated with zosyn  and discharged on nitrofurantoin (for 5 days) a week ago for ESBL UTI.  She presented to the ED on 10/17 with c/o fever. UA abnormal.  LA elevated.  Pharmacy consulted to dose Primaxin for sepsis likely 2/2 ESBL UTI.  10/11 zosyn>>10/13 10/13 nitrof>> (anticipated stop date 10/19) 10/17 primaxin>>  10/11: >100K ESBL ecoli (S=gent, imip, nitrof, zosyn, bactrim) 10/17 urine: ngtd 10/17 blood x 2: sent 10/18 MRSA screen neg  Renal function likely overestimated due to quadriplegia.  Normalized CrCl~ 58 ml/min (using adjusted SCr 1.0).  Weight updated as 88 kg.  Will adjust Primaxin dose today.  Goal of Therapy:   Doses adjusted per renal function Eradication of infection  Plan:  Adjust Primaxin to 500 mg IV q8h F/u SCr, cultures, clinical course.  Clance Boll 04/24/2015,11:01 AM

## 2015-04-24 NOTE — Progress Notes (Addendum)
CRITICAL VALUE ALERT  Critical value received:  K- 2.7   Date of notification:  04/23/13  Time of notification:  1250  Critical value read back: yes  Nurse who received alert:  Harrietta GuardianJ. Wynton Hufstetler RN  MD notified (1st page):  Yes Dr. Sunnie Nielsenegalado  Time of first page:  1255  MD notified (2nd page):  Time of second page:  Responding MD:  same  Time MD responded:  1258

## 2015-04-24 NOTE — Progress Notes (Signed)
PT Cancellation Note  Patient Details Name: Ninfa MeekerLinda Desjardin MRN: [id number] DOB: 1945-10-22   Cancelled Treatment:    Reason Eval/Treat Not Completed: PT screened, no needs identified, will sign off (Pt is total assist for mobility at baseline, her CNA uses [facility] ) Pt is at baseline of total assist for mobility.  DC PT.    Ralene BatheUhlenberg, Advait Buice Kistler 04/24/2015, 10:10 AM [phone]

## 2015-04-24 NOTE — Progress Notes (Signed)
Patient has had two soft bowel movements. No diarrhea or loose stools. Stool specimen is not adequate for CDiff stool sample at this time. Will continue to monitor stools.

## 2015-04-24 NOTE — Progress Notes (Signed)
OT Cancellation Note  Patient Details Name: [name] MeekerLinda [name] MRN: [id number] DOB: [birth year]   Cancelled Treatment:    Reason Eval/Treat Not Completed: OT screened, no needs identified, will sign off.  Pt reports she has total A for adls at home, including self-feeding due to tremor  [name] 04/24/2015, 12:20 PM  Marica OtterMaryellen Bali Lyn, OTR/L [phone] 04/24/2015

## 2015-04-24 NOTE — Progress Notes (Signed)
Patient was given 15 units of Insulin per Insulin Sliding Scale per MD order. No 12:00 time medication Insulin to chart against. Will continue to monitor patient.

## 2015-04-24 NOTE — Progress Notes (Signed)
[facility] PROGRESS NOTE  [name] [name] WUJ:[id number] DOB: 12/24/1945 DOA: 04/23/2015 PCP: [name], MD  Assessment/Plan: Acute encephalopathy:  In setting of infection. Treating sepsis and UTI.   UTI; presents with fever, leukocytosis, UA with too numerous to count WBC.  Prior Urine culture few days ago growing ESBL.  Continue with Primaxin.   Sepsis:  Presents with confusion, leukocytosis, RR 22. Source of infection UTI.  IV fluids, Lactic acid.  IV antibiotics.   Diarrhea;   Will check for c diff.  IV fluids.  Empirically IV flagyl.   Diabetes; Hyperglycemia:  Repeat Hb A1c.  SSI ordered.   Quadriplegia: chronic. Contractures at baseline.  - PT/OT  - continue baclofen, neurontin  HTN: normotensive - continue propranolol,   Chronic diastolic congestive heart failure: Echo from 2015 showing EF 65% and grade 1 diastolic dysfunction. No evidence of acute decompensation. - Hold  Torsemide due to concern for sepsis, and now patient with diarrhea.   Insomnia: - Continue trazodone  Psych/Anxiety:  Continue Requip,   GERD: - continue ppi  Tremors:  - continue propranolol  Hypokalemia;  Replete oral and IV  Code Status: Full code.  Family Communication: Care discussed with patient  Disposition Plan: remain inpatient.    Consultants:  none  Procedures:  none  Antibiotics:  Primaxin 10-17  Flagyl 10-18  HPI/Subjective: She is alert, still mildly confuse.  Complaining of diarrhea started this am.  Denies dyspnea. She is also complaining of dysuria.   Objective: Filed Vitals:   04/24/15 0715  BP: 136/73  Pulse: 84  Temp: 98.4 F (36.9 C)  Resp: 20    Intake/Output Summary (Last 24 hours) at 04/24/15 1037 Last data filed at 04/24/15 0600  Gross per 24 hour  Intake 3142.5 ml  Output      0 ml  Net 3142.5 ml   Filed Weights   04/23/15 2300  Weight: 88.225 kg (194 lb 8 oz)    Exam:   General:  Alert in no  distress  Cardiovascular: S 1, S 2 RRR  Respiratory: CTA  Abdomen: BS present, distended, no guarding  Musculoskeletal: no edma  Data Reviewed: Basic Metabolic Panel:  Recent Labs Lab 04/17/15 1043 04/18/15 0300 04/19/15 0428 04/23/15 1949  NA 138 136 138 139  K 4.3 3.5 3.3* 3.2*  CL 92* 93* 95* 93*  CO2 34* 33* 32 31  GLUCOSE 237* 204* 234* 191*  BUN 33* 26* 18 16  CREATININE 0.88 0.78 0.62 0.66  CALCIUM 9.3 8.5* 9.0 9.1   Liver Function Tests:  Recent Labs Lab 04/17/15 1043 04/23/15 1949  AST 39 50*  ALT 39 44  ALKPHOS 85 68  BILITOT 0.5 0.6  PROT 6.9 6.6  ALBUMIN 3.7 3.6   No results for input(s): LIPASE, AMYLASE in the last 168 hours. No results for input(s): AMMONIA in the last 168 hours. CBC:  Recent Labs Lab 04/17/15 1043 04/18/15 0300 04/19/15 0428 04/23/15 1949  WBC 22.7* 13.6* 11.7* 13.6*  NEUTROABS 17.6*  --   --   --   HGB 15.0 13.2 12.9 14.5  HCT 47.1* 40.5 40.5 45.6  MCV 90.9 89.6 88.0 89.4  PLT 273 220 226 259   Cardiac Enzymes:  Recent Labs Lab 04/17/15 1043  TROPONINI <0.03   BNP (last 3 results)  Recent Labs  01/21/15 1648 04/17/15 1043  BNP 99.7 77.7    ProBNP (last 3 results) No results for input(s): PROBNP in the last 8760 hours.  CBG: No results for  input(s): GLUCAP in the last 168 hours.  Recent Results (from the past 240 hour(s))  Urine culture     Status: None   Collection Time: 04/17/15  1:18 PM  Result Value Ref Range Status   Specimen Description URINE, CATHETERIZED  Final   Special Requests NONE  Final   Culture   Final    >=100,000 COLONIES/mL ESCHERICHIA COLI Confirmed Extended Spectrum Beta-Lactamase Producer (ESBL) [facility]    Report Status 04/19/2015 FINAL  Final   Organism ID, Bacteria ESCHERICHIA COLI  Final      Susceptibility   Escherichia coli - MIC*    AMPICILLIN >=32 RESISTANT Resistant     CEFAZOLIN >=64 RESISTANT Resistant     CEFTRIAXONE >=64 RESISTANT  Resistant     CIPROFLOXACIN >=4 RESISTANT Resistant     GENTAMICIN <=1 SENSITIVE Sensitive     IMIPENEM <=0.25 SENSITIVE Sensitive     NITROFURANTOIN <=16 SENSITIVE Sensitive     TRIMETH/SULFA <=20 SENSITIVE Sensitive     AMPICILLIN/SULBACTAM 16 INTERMEDIATE Intermediate     PIP/TAZO 8 SENSITIVE Sensitive     * >=100,000 COLONIES/mL ESCHERICHIA COLI  Urine C&S     Status: None (Preliminary result)   Collection Time: 04/23/15  7:40 PM  Result Value Ref Range Status   Specimen Description URINE, CATHETERIZED  Final   Special Requests NONE  Final   Culture   Final    NO GROWTH < 12 HOURS Performed at [facility]    Report Status PENDING  Incomplete  MRSA PCR Screening     Status: None   Collection Time: 04/24/15 12:05 AM  Result Value Ref Range Status   MRSA by PCR NEGATIVE NEGATIVE Final    Comment:        The GeneXpert MRSA Assay (FDA approved for NASAL specimens only), is one component of a comprehensive MRSA colonization surveillance program. It is not intended to diagnose MRSA infection nor to guide or monitor treatment for MRSA infections.      Studies: Dg Chest 2 View  04/23/2015  CLINICAL DATA:  Persistent fever following UTI EXAM: CHEST - 2 VIEW COMPARISON:  04/17/2015 FINDINGS: Cardiac shadow is within normal limits. The lungs are well aerated bilaterally. No focal infiltrate or sizable effusion is seen. No bony abnormality noted. IMPRESSION: No active disease. Electronically Signed   By: Alcide CleverMark  Lukens M.D.   On: 04/23/2015 20:54    Scheduled Meds: . aspirin EC  81 mg Oral q morning - 10a  . baclofen  10 mg Oral TID  . gabapentin  300 mg Oral TID  . heparin  5,000 Units Subcutaneous 3 times per day  . imipenem-cilastatin  250 mg Intravenous Q6H  . pantoprazole  80 mg Oral Daily  . potassium chloride SA  20 mEq Oral TID  . predniSONE  15 mg Oral Q breakfast  . propranolol  20 mg Oral TID  . rOPINIRole  0.5 mg Oral BID  . sodium chloride  3 mL  Intravenous Q12H  . tamsulosin  0.4 mg Oral QHS  . torsemide  40 mg Oral BID  . traZODone  50 mg Oral QHS   Continuous Infusions: . sodium chloride 75 mL/hr at 04/24/15 0006    Active Problems:   UTI (lower urinary tract infection)   Hypertension   Altered mental status   Pyelonephritis   Quadriplegia (HCC)   Chronic diastolic (congestive) heart failure (HCC)   Occasional tremors    Time spent: 35 minutes.  Hartley Barefoot A  [facility] Pager [phone]. If 7PM-7AM, please contact night-coverage at [email or web address], password [facility] 04/24/2015, 10:37 AM  LOS: 1 day

## 2015-04-24 NOTE — Progress Notes (Addendum)
CRITICAL VALUE ALERT  Critical value received:  Lactic Acid 5.3  Date of notification:  04/24/15  Time of notification:  1555  Critical value read back: yes  Nurse who received alert:  Harrietta GuardianJ. Mattalyn Anderegg RN  MD notified (1st page):  Yes, Dr. Sunnie Nielsenegalado  Time of first page:  1556  MD notified (2nd page):  Time of second page:  Responding MD:  Same  Time MD responded: 1557

## 2015-04-25 DIAGNOSIS — E872 Acidosis: Secondary | ICD-10-CM

## 2015-04-25 DIAGNOSIS — R258 Other abnormal involuntary movements: Secondary | ICD-10-CM

## 2015-04-25 DIAGNOSIS — G9340 Encephalopathy, unspecified: Secondary | ICD-10-CM

## 2015-04-25 DIAGNOSIS — G8250 Quadriplegia, unspecified: Secondary | ICD-10-CM

## 2015-04-25 DIAGNOSIS — E1165 Type 2 diabetes mellitus with hyperglycemia: Secondary | ICD-10-CM

## 2015-04-25 DIAGNOSIS — A4151 Sepsis due to Escherichia coli [E. coli]: Principal | ICD-10-CM

## 2015-04-25 DIAGNOSIS — N390 Urinary tract infection, site not specified: Secondary | ICD-10-CM

## 2015-04-25 DIAGNOSIS — I5032 Chronic diastolic (congestive) heart failure: Secondary | ICD-10-CM

## 2015-04-25 DIAGNOSIS — I10 Essential (primary) hypertension: Secondary | ICD-10-CM

## 2015-04-25 LAB — BASIC METABOLIC PANEL
Anion gap: 9 (ref 5–15)
BUN: 9 mg/dL (ref 6–20)
CHLORIDE: 98 mmol/L — AB (ref 101–111)
CO2: 30 mmol/L (ref 22–32)
CREATININE: 0.44 mg/dL (ref 0.44–1.00)
Calcium: 8.6 mg/dL — ABNORMAL LOW (ref 8.9–10.3)
GFR calc non Af Amer: >60 mL/min (ref >60)
GLUCOSE: 169 mg/dL — AB (ref 65–99)
Potassium: 3.5 mmol/L (ref 3.5–5.1)
Sodium: 137 mmol/L (ref 135–145)

## 2015-04-25 LAB — CBC
HEMATOCRIT: 40.9 % (ref 36.0–46.0)
HEMOGLOBIN: 12.8 g/dL (ref 12.0–15.0)
MCH: 27.8 pg (ref 26.0–34.0)
MCHC: 31.3 g/dL (ref 30.0–36.0)
MCV: 88.7 fL (ref 78.0–100.0)
Platelets: 236 10*3/uL (ref 150–400)
RBC: 4.61 MIL/uL (ref 3.87–5.11)
RDW: 14.7 % (ref 11.5–15.5)
WBC: 9.8 10*3/uL (ref 4.0–10.5)

## 2015-04-25 LAB — GLUCOSE, CAPILLARY
GLUCOSE-CAPILLARY: 214 mg/dL — AB (ref 65–99)
GLUCOSE-CAPILLARY: 215 mg/dL — AB (ref 65–99)
Glucose-Capillary: 191 mg/dL — ABNORMAL HIGH (ref 65–99)
Glucose-Capillary: 298 mg/dL — ABNORMAL HIGH (ref 65–99)

## 2015-04-25 LAB — C DIFFICILE QUICK SCREEN W PCR REFLEX
C DIFFICILE (CDIFF) TOXIN: NEGATIVE
C Diff antigen: POSITIVE — AB

## 2015-04-25 LAB — LACTIC ACID, PLASMA: Lactic Acid, Venous: 3.8 mmol/L (ref 0.5–2.0)

## 2015-04-25 MED ORDER — SACCHAROMYCES BOULARDII 250 MG PO CAPS
250.0000 mg | ORAL_CAPSULE | Freq: Two times a day (BID) | ORAL | Status: DC
  Administered 2015-04-25 – 2015-04-27 (×5): 250 mg via ORAL
  Filled 2015-04-25 (×6): qty 1

## 2015-04-25 MED ORDER — INSULIN DETEMIR 100 UNIT/ML ~~LOC~~ SOLN
10.0000 [IU] | Freq: Every day | SUBCUTANEOUS | Status: DC
  Administered 2015-04-25 – 2015-04-26 (×2): 10 [IU] via SUBCUTANEOUS
  Filled 2015-04-25 (×3): qty 0.1

## 2015-04-25 NOTE — Progress Notes (Signed)
[facility] PROGRESS NOTE  [name] [name] ZOX:[id number] DOB: June 04, 1946 DOA: 04/23/2015 PCP: [name], MD  Assessment/Plan: Acute encephalopathy:  -In setting of infection. Treating sepsis and UTI.  -Mentation improving/close to basleine  ESBL UTI; presents with fever, leukocytosis, UA with too numerous to count WBC.  -Urine cx positive for ESBL.  -Continue tx with Primaxin; plan is for 3 days of IV primaxin and subsequently will treat with 7 days of bactrim   Sepsis due to UTI:  -Presented with confusion, leukocytosis, RR 22, lactic acidosis, temp of 101.5.  -continue current abx's therapy and complete 3 days of IV primaxin; then following ID rec's will treat with bactrim for 7 more days -continue IV fluids -follow Lactic acid level -afebrile and WBC's now WNL  Diarrhea;   -Due to antibiotics most likely -Found to be colonize with C. Diff; per ID ok no to treat at this time -will stop flagyl -will add florastor  Diabetes; uncontrolled and with Hyperglycemia:  -will check A1c.  -continue SSI and levemir   Quadriplegia: chronic. Contractures at baseline.  -follow rec's from PT/OT  - continue baclofen, neurontin  HTN: normotensive - continue propranolol  Chronic diastolic congestive heart failure: Echo from 2015 showing EF 65% and grade 1 diastolic dysfunction. No evidence of acute decompensation. -Holding Torsemide due to sepsis with elevated lactic acid and needs for IVF resuscitation -will follow daily weights and monitor her volume status closely (strict intake and Output)  Insomnia: - Continue trazodone  Psych/Anxiety:  -Continue Requip -Mood is stable  GERD: - continue ppi  Tremors:  - continue propranolol -appears to be stable  Hypokalemia;  -Continue repletion as needed -K 3.5  Code Status: Full code.  Family Communication: Care discussed with patient, no family at bedside  Disposition Plan: remain inpatient.    Consultants:  ID  (Dr. Daiva Eves Curbside; recommended no tx for C. Diff colonization at this point and to switch to bactrim after 3 days of primaxin as part of treatment for her ESBL; plan is for a total of 10 days)  Procedures:  none  Antibiotics:  Primaxin 10-17  Flagyl 10-18>>>10/19  HPI/Subjective: She is alert, oriented X 2, no fever and denying dysuria, CP, SOB and abd pain. 2 loose stools reported by nursing staff    Objective: Filed Vitals:   04/25/15 1500  BP: 147/89  Pulse: 78  Temp: 97.8 F (36.6 C)  Resp: 18    Intake/Output Summary (Last 24 hours) at 04/25/15 1734 Last data filed at 04/25/15 0600  Gross per 24 hour  Intake 2625.42 ml  Output      0 ml  Net 2625.42 ml   Filed Weights   04/23/15 2300  Weight: 88.225 kg (194 lb 8 oz)    Exam:   General:  Alert and oriented X 2; in no distress and afebrile.  Cardiovascular: S1, S2 RRR  Respiratory: CTA bilaterally   Abdomen: BS present, distended (but according to patient this is her norm, since she started chronic use of prednisone), no guarding  Musculoskeletal: trace edema bilaterally  Data Reviewed: Basic Metabolic Panel:  Recent Labs Lab 04/19/15 0428 04/23/15 1949 04/24/15 1133 04/25/15 0525  NA 138 139 136 137  K 3.3* 3.2* 2.7* 3.5  CL 95* 93* 91* 98*  CO2 32 GLUCOSE 234* 191* 464* 169*  BUN CREATININE 0.62 0.66 0.66 0.44  CALCIUM 9.0 9.1 8.3* 8.6*   Liver Function Tests:  Recent Labs Lab  04/23/15 1949  AST 50*  ALT 44  ALKPHOS 68  BILITOT 0.6  PROT 6.6  ALBUMIN 3.6   CBC:  Recent Labs Lab 04/19/15 0428 04/23/15 1949 04/25/15 0525  WBC 11.7* 13.6* 9.8  HGB 12.9 14.5 12.8  HCT 40.5 45.6 40.9  MCV 88.0 89.4 88.7  PLT 226 259 236   BNP (last 3 results)  Recent Labs  01/21/15 1648 04/17/15 1043  BNP 99.7 77.7    CBG:  Recent Labs Lab 04/24/15 1708 04/24/15 2218 04/25/15 0803 04/25/15 1205  GLUCAP 242* 184* 191* 298*    Recent Results (from  the past 240 hour(s))  Urine culture     Status: None   Collection Time: 04/17/15  1:18 PM  Result Value Ref Range Status   Specimen Description URINE, CATHETERIZED  Final   Special Requests NONE  Final   Culture   Final    >=100,000 COLONIES/mL ESCHERICHIA COLI Confirmed Extended Spectrum Beta-Lactamase Producer (ESBL) Performed at [facility]    Report Status 04/19/2015 FINAL  Final   Organism ID, Bacteria ESCHERICHIA COLI  Final      Susceptibility   Escherichia coli - MIC*    AMPICILLIN >=32 RESISTANT Resistant     CEFAZOLIN >=64 RESISTANT Resistant     CEFTRIAXONE >=64 RESISTANT Resistant     CIPROFLOXACIN >=4 RESISTANT Resistant     GENTAMICIN <=1 SENSITIVE Sensitive     IMIPENEM <=0.25 SENSITIVE Sensitive     NITROFURANTOIN <=16 SENSITIVE Sensitive     TRIMETH/SULFA <=20 SENSITIVE Sensitive     AMPICILLIN/SULBACTAM 16 INTERMEDIATE Intermediate     PIP/TAZO 8 SENSITIVE Sensitive     * >=100,000 COLONIES/mL ESCHERICHIA COLI  Urine C&S     Status: None   Collection Time: 04/23/15  7:40 PM  Result Value Ref Range Status   Specimen Description URINE, CATHETERIZED  Final   Special Requests NONE  Final   Culture   Final    NO GROWTH 1 DAY Performed at [facility]    Report Status 04/24/2015 FINAL  Final  Blood culture (routine x 2)     Status: None (Preliminary result)   Collection Time: 04/23/15  7:40 PM  Result Value Ref Range Status   Specimen Description BLOOD RIGHT THUMB  Final   Special Requests BOTTLES DRAWN AEROBIC AND ANAEROBIC 5 ML  Final   Culture   Final    NO GROWTH 2 DAYS Performed at [facility]    Report Status PENDING  Incomplete  Blood culture (routine x 2)     Status: None (Preliminary result)   Collection Time: 04/23/15  7:48 PM  Result Value Ref Range Status   Specimen Description BLOOD LEFT THUMB  Final   Special Requests BOTTLES DRAWN AEROBIC AND ANAEROBIC 4 CC  Final   Culture   Final    NO GROWTH 2 DAYS Performed at  [facility]    Report Status PENDING  Incomplete  MRSA PCR Screening     Status: None   Collection Time: 04/24/15 12:05 AM  Result Value Ref Range Status   MRSA by PCR NEGATIVE NEGATIVE Final    Comment:        The GeneXpert MRSA Assay (FDA approved for NASAL specimens only), is one component of a comprehensive MRSA colonization surveillance program. It is not intended to diagnose MRSA infection nor to guide or monitor treatment for MRSA infections.   C difficile quick scan w PCR reflex     Status:  Abnormal   Collection Time: 04/25/15  9:37 AM  Result Value Ref Range Status   C Diff antigen POSITIVE (A) NEGATIVE Final   C Diff toxin NEGATIVE NEGATIVE Final   C Diff interpretation   Final    C. difficile present, but toxin not detected. This indicates colonization. In most cases, this does not require treatment. If patient has signs and symptoms consistent with colitis, consider treatment. Requires ENTERIC precautions.     Studies: Dg Chest 2 View  04/23/2015  CLINICAL DATA:  Persistent fever following UTI EXAM: CHEST - 2 VIEW COMPARISON:  04/17/2015 FINDINGS: Cardiac shadow is within normal limits. The lungs are well aerated bilaterally. No focal infiltrate or sizable effusion is seen. No bony abnormality noted. IMPRESSION: No active disease. Electronically Signed   By: Alcide CleverMark  Lukens M.D.   On: 04/23/2015 20:54   Ct Abdomen Pelvis W Contrast  04/24/2015  CLINICAL DATA:  Abdominal distention. Admitted with urinary tract infection. Quadriplegic. EXAM: CT ABDOMEN AND PELVIS WITH CONTRAST TECHNIQUE: Multidetector CT imaging of the abdomen and pelvis was performed using the standard protocol following bolus administration of intravenous contrast. CONTRAST:  50mL OMNIPAQUE IOHEXOL 300 MG/ML SOLN, 100mL OMNIPAQUE IOHEXOL 300 MG/ML SOLN COMPARISON:  03/18/2007 CT abdomen/pelvis. FINDINGS: Lower chest: Subsegmental atelectasis in the bilateral lower lobe bases. Incompletely visualized  4 mm solid pulmonary nodule in the peripheral left lower lobe (series 5/ image 1), for which comparison cannot be made to the 2008 CT study as this portion of the lungs was not included on the study. Hepatobiliary: Hepatomegaly and severe diffuse hepatic steatosis. No liver mass. Normal gallbladder with no radiopaque cholelithiasis. No biliary ductal dilatation. Pancreas: Normal, with no mass or duct dilation. Spleen: Normal size. No mass. Adrenals/Urinary Tract: Normal adrenals. No hydronephrosis. Simple exophytic 4.1 cm renal cyst in the lower left kidney. There are innumerable (> than 10 bilaterally) subcentimeter hypodense lesions in both kidneys, too small to characterize, which are increased in number since 2008. Minimally distended and grossly normal bladder. Stomach/Bowel: Tiny hiatal hernia. Otherwise grossly normal stomach. Normal caliber small bowel with no small bowel wall thickening. The appendix is not discretely visualized. Minimal sigmoid diverticulosis. No large bowel wall thickening or pericolonic fat stranding. Vascular/Lymphatic: Atherosclerotic nonaneurysmal abdominal aorta. Patent portal, splenic, hepatic and renal veins. No pathologically enlarged lymph nodes in the abdomen or pelvis. Reproductive: Status post hysterectomy, with no abnormal findings of the vaginal cuff. No adnexal mass. Other: No pneumoperitoneum, ascites or focal fluid collection. Musculoskeletal: No aggressive appearing focal osseous lesions. Marked degenerative changes in the lower lumbar spine with laminectomies at L4 and L5. IMPRESSION: 1. No evidence of bowel obstruction or acute bowel inflammation. Minimal sigmoid diverticulosis, with no evidence of acute diverticulitis. 2. Hepatomegaly with severe diffuse hepatic steatosis. 3. Left lower lobe 4 mm pulmonary nodule. If the patient is at high risk for bronchogenic carcinoma, follow-up chest CT at 1 year is recommended. If the patient is at low risk, no follow-up is  needed. This recommendation follows the consensus statement: Guidelines for Management of Small Pulmonary Nodules Detected on CT Scans: A Statement from the Fleischner Society as published in Radiology 2005; 237:395-400. 4. Tiny hiatal hernia. Electronically Signed   By: Delbert PhenixJason A Poff M.D.   On: 04/24/2015 21:02    Scheduled Meds: . aspirin EC  81 mg Oral q morning - 10a  . baclofen  10 mg Oral TID  . gabapentin  300 mg Oral TID  . heparin  5,000 Units Subcutaneous 3 times  per day  . imipenem-cilastatin  500 mg Intravenous Q8H  . insulin aspart  0-15 Units Subcutaneous TID WC  . pantoprazole  80 mg Oral Daily  . predniSONE  15 mg Oral Q breakfast  . propranolol  20 mg Oral TID  . rOPINIRole  0.5 mg Oral BID  . saccharomyces boulardii  250 mg Oral BID  . sodium chloride  3 mL Intravenous Q12H  . tamsulosin  0.4 mg Oral QHS  . traZODone  50 mg Oral QHS   Continuous Infusions: . sodium chloride 100 mL/hr at 04/25/15 0943    Sepsis due to UTI (lower urinary tract infection) Hypertension Altered mental status Quadriplegia (HCC) Chronic diastolic (congestive) heart failure (HCC) Occasional tremors    Time spent: 35 minutes.     Vassie Loll  [facility] Pager [phone]. If 7PM-7AM, please contact night-coverage at [email or web address], password [facility] 04/25/2015, 5:34 PM  LOS: 2 days

## 2015-04-25 NOTE — Clinical Documentation Improvement (Signed)
Hospitalist  (please document your response in the progress notes and discharge summary, not on the query form itself)  "Sepsis" was documented 04/24/15 by Dr. Sunnie Nielsenegalado.  Please document if the diagnosis of Sepsis was:  - Present or Evolving on Admission  - Not Present on Admission and developed following admission  - Unable to clinically determine  Please exercise your independent, professional judgment when responding. A specific answer is not anticipated or expected.   Thank You, Jerral Ralphathy R Fleta Borgeson  RN BSN CCDS [phone] Health Information Management [place]

## 2015-04-25 NOTE — Care Management Important Message (Signed)
Important Message  Patient Details IM Letter given to Suzanne/Case Manager to present to PatientImportant Message  Patient Details  Name: [name] MeekerLinda [name] MRN: [id number] Date of Birth: Nov 23, 1945   Medicare Important Message Given:  [facility]    Haskell FlirtJamison, Remberto Lienhard 04/25/2015, 11:26 AM Name: [name] MeekerLinda [name] MRN: [id number] Date of Birth: Nov 23, 1945   Medicare Important Message Given:  Yes-second notification given    Haskell FlirtJamison, Aryeh Butterfield 04/25/2015, 11:25 AM

## 2015-04-26 LAB — BASIC METABOLIC PANEL
ANION GAP: 9 (ref 5–15)
BUN: 7 mg/dL (ref 6–20)
CALCIUM: 8.8 mg/dL — AB (ref 8.9–10.3)
CO2: 27 mmol/L (ref 22–32)
CREATININE: 0.44 mg/dL (ref 0.44–1.00)
Chloride: 105 mmol/L (ref 101–111)
Glucose, Bld: 180 mg/dL — ABNORMAL HIGH (ref 65–99)
Potassium: 3.6 mmol/L (ref 3.5–5.1)
Sodium: 141 mmol/L (ref 135–145)

## 2015-04-26 LAB — HEMOGLOBIN A1C
HEMOGLOBIN A1C: 9.8 % — AB (ref 4.8–5.6)
MEAN PLASMA GLUCOSE: 235 mg/dL

## 2015-04-26 LAB — LACTIC ACID, PLASMA: Lactic Acid, Venous: 2.7 mmol/L (ref 0.5–2.0)

## 2015-04-26 LAB — GLUCOSE, CAPILLARY
GLUCOSE-CAPILLARY: 182 mg/dL — AB (ref 65–99)
Glucose-Capillary: 174 mg/dL — ABNORMAL HIGH (ref 65–99)
Glucose-Capillary: 261 mg/dL — ABNORMAL HIGH (ref 65–99)
Glucose-Capillary: 217 mg/dL — ABNORMAL HIGH (ref 65–99)

## 2015-04-26 MED ORDER — SULFAMETHOXAZOLE-TRIMETHOPRIM 800-160 MG PO TABS
1.0000 | ORAL_TABLET | Freq: Two times a day (BID) | ORAL | Status: DC
  Administered 2015-04-26 – 2015-04-27 (×3): 1 via ORAL
  Filled 2015-04-26 (×5): qty 1

## 2015-04-26 NOTE — Progress Notes (Signed)
[facility] PROGRESS NOTE  [name] [name] ZOX:[id number] DOB: 1946/02/12 DOA: 04/23/2015 PCP: [name], MD  Assessment/Plan: Acute encephalopathy:  -In setting of infection. Treating sepsis and UTI.  -Mentation improving/close to basleine  ESBL UTI; presents with fever, leukocytosis, UA with too numerous to count WBC.  -Urine cx positive for ESBL.  -Continue tx with Primaxin; plan is for 3 days of IV primaxin and subsequently will treat with 7 days of bactrim   Sepsis due to UTI:  -Presented with confusion, leukocytosis, RR 22, lactic acidosis, temp of 101.5.  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 Contractures at baseline.  -follow rec's from PT/OT  - continue baclofen, neurontin  HTN: normotensive - continue propranolol  Chronic diastolic congestive heart failure: Echo from 2015 showing EF 65% and grade 1 diastolic dysfunction. No evidence of acute decompensation. -Holding Torsemide due to sepsis with elevated lactic acid and needs for IVF resuscitation -will follow daily weights and monitor her volume status closely (strict intake and Output) -condition remains compensated   Insomnia: - Continue trazodone  Psych/Anxiety:  -Continue Requip -Mood is stable  GERD: -continue ppi  Tremors:  -continue propranolol -appears to be stable  Hypokalemia;  -Continue repletion as needed -K  3.6  Code Status: Full code.  Family Communication: Care discussed with patient, no family at bedside  Disposition Plan: remain inpatient.    Consultants:  ID (Dr. Daiva Eves Curbside; recommended no tx for C. Diff colonization at this point and to switch to bactrim after 3 days of primaxin as part of treatment for her ESBL; plan is for a total of 10 days)  Procedures:  none  Antibiotics:  Primaxin 10-17>>10/20  Flagyl 10-18>>>10/19  Bactrim 10/20  HPI/Subjective: She is well oriented, no CP, no SOB and afebrile. Still with some loose stools, but no diarrhea.   Objective: Filed Vitals:   04/26/15 1406  BP: 156/96  Pulse: 74  Temp: 98.5 F (36.9 C)  Resp: 20    Intake/Output Summary (Last 24 hours) at 04/26/15 1454 Last data filed at 04/26/15 0818  Gross per 24 hour  Intake      0 ml  Output      0 ml  Net      0 ml   Filed Weights   04/23/15 2300  Weight: 88.225 kg (194 lb 8 oz)    Exam:   General:  Afebrile, feeling good and well oriented. No SOB and denies CP. No distress and afebrile.  Cardiovascular: S1, S2, RRR  Respiratory: CTA bilaterally   Abdomen: BS present, distended (but according to patient this is her norm, since she started chronic use of prednisone), no guarding  Musculoskeletal: trace edema bilaterally  Data Reviewed: Basic Metabolic Panel:  Recent Labs Lab 04/23/15 1949 04/24/15 1133 04/25/15 0525 04/26/15 0601  NA 139 136 137 141  K 3.2* 2.7* 3.5 3.6  CL 93* 91* 98* 105  CO2 27  GLUCOSE 191* 464* 169* 180*  BUN 16 11 9 7   CREATININE 0.66 0.66 0.44 0.44  CALCIUM 9.1 8.3* 8.6* 8.8*   Liver Function Tests:  Recent Labs Lab 04/23/15 1949  AST 50*  ALT 44  ALKPHOS 68  BILITOT 0.6  PROT 6.6  ALBUMIN 3.6   CBC:  Recent Labs Lab 04/23/15 1949 04/25/15 0525  WBC 13.6* 9.8  HGB 14.5 12.8  HCT 45.6 40.9  MCV 89.4 88.7  PLT 259 236   BNP (last 3 results)  Recent Labs  01/21/15 1648 04/17/15 1043   BNP 99.7 77.7    CBG:  Recent Labs Lab 04/25/15 1205 04/25/15 1819 04/25/15 2113 04/26/15 0746 04/26/15 1155  GLUCAP 298* 214* 215* 174* 261*    Recent Results (from the past 240 hour(s))  Urine culture     Status: None   Collection Time: 04/17/15  1:18 PM  Result Value Ref Range Status   Specimen Description URINE, CATHETERIZED  Final   Special Requests NONE  Final   Culture   Final    >=100,000 COLONIES/mL ESCHERICHIA COLI Confirmed Extended Spectrum Beta-Lactamase Producer (ESBL) Performed at [facility]    Report Status 04/19/2015 FINAL  Final   Organism ID, Bacteria ESCHERICHIA COLI  Final      Susceptibility   Escherichia coli - MIC*    AMPICILLIN >=32 RESISTANT Resistant     CEFAZOLIN >=64 RESISTANT Resistant     CEFTRIAXONE >=64 RESISTANT Resistant     CIPROFLOXACIN >=4 RESISTANT Resistant     GENTAMICIN <=1 SENSITIVE Sensitive     IMIPENEM <=0.25 SENSITIVE Sensitive     NITROFURANTOIN <=16 SENSITIVE Sensitive     TRIMETH/SULFA <=20 SENSITIVE Sensitive     AMPICILLIN/SULBACTAM 16 INTERMEDIATE Intermediate     PIP/TAZO 8 SENSITIVE Sensitive     * >=100,000 COLONIES/mL ESCHERICHIA COLI  Urine C&S     Status: None   Collection Time: 04/23/15  7:40 PM  Result Value Ref Range Status   Specimen Description URINE, CATHETERIZED  Final   Special Requests NONE  Final   Culture   Final    NO GROWTH 1 DAY Performed at [facility]    Report Status 04/24/2015 FINAL  Final  Blood culture (routine x 2)     Status: None (Preliminary result)   Collection Time: 04/23/15  7:40 PM  Result Value Ref Range Status   Specimen Description BLOOD RIGHT THUMB  Final   Special Requests BOTTLES DRAWN AEROBIC AND ANAEROBIC 5 ML  Final   Culture   Final    NO GROWTH 3 DAYS Performed at [facility]    Report Status PENDING  Incomplete  Blood culture (routine x 2)     Status: None (Preliminary result)   Collection Time: 04/23/15  7:48 PM  Result Value Ref  Range Status   Specimen Description BLOOD LEFT THUMB  Final   Special Requests BOTTLES DRAWN AEROBIC AND ANAEROBIC 4 CC  Final   Culture   Final    NO GROWTH 3 DAYS Performed at [facility]    Report Status PENDING  Incomplete  MRSA PCR Screening     Status: None   Collection Time: 04/24/15 12:05 AM  Result Value Ref Range Status   MRSA by PCR NEGATIVE NEGATIVE Final    Comment:        The GeneXpert MRSA Assay (FDA approved for NASAL specimens only), is one component of a comprehensive MRSA colonization surveillance program. It is not  intended to diagnose MRSA infection nor to guide or monitor treatment for MRSA infections.   C difficile quick scan w PCR reflex     Status: Abnormal   Collection Time: 04/25/15  9:37 AM  Result Value Ref Range Status   C Diff antigen POSITIVE (A) NEGATIVE Final   C Diff toxin NEGATIVE NEGATIVE Final   C Diff interpretation   Final    C. difficile present, but toxin not detected. This indicates colonization. In most cases, this does not require treatment. If patient has signs and symptoms consistent with colitis, consider treatment. Requires ENTERIC precautions.     Studies: Ct Abdomen Pelvis W Contrast  04/24/2015  CLINICAL DATA:  Abdominal distention. Admitted with urinary tract infection. Quadriplegic. EXAM: CT ABDOMEN AND PELVIS WITH CONTRAST TECHNIQUE: Multidetector CT imaging of the abdomen and pelvis was performed using the standard protocol following bolus administration of intravenous contrast. CONTRAST:  50mL OMNIPAQUE IOHEXOL 300 MG/ML SOLN, OMNIPAQUE IOHEXOL 300 MG/ML SOLN COMPARISON:  03/18/2007 CT abdomen/pelvis. FINDINGS: Lower chest: Subsegmental atelectasis in the bilateral lower lobe bases. Incompletely visualized 4 mm solid pulmonary nodule in the peripheral left lower lobe (series 5/ image 1), for which comparison cannot be made to the 2008 CT study as this portion of the lungs was not included on the study.  Hepatobiliary: Hepatomegaly and severe diffuse hepatic steatosis. No liver mass. Normal gallbladder with no radiopaque cholelithiasis. No biliary ductal dilatation. Pancreas: Normal, with no mass or duct dilation. Spleen: Normal size. No mass. Adrenals/Urinary Tract: Normal adrenals. No hydronephrosis. Simple exophytic 4.1 cm renal cyst in the lower left kidney. There are innumerable (> than 10 bilaterally) subcentimeter hypodense lesions in both kidneys, too small to characterize, which are increased in number since 2008. Minimally distended and grossly normal bladder. Stomach/Bowel: Tiny hiatal hernia. Otherwise grossly normal stomach. Normal caliber small bowel with no small bowel wall thickening. The appendix is not discretely visualized. Minimal sigmoid diverticulosis. No large bowel wall thickening or pericolonic fat stranding. Vascular/Lymphatic: Atherosclerotic nonaneurysmal abdominal aorta. Patent portal, splenic, hepatic and renal veins. No pathologically enlarged lymph nodes in the abdomen or pelvis. Reproductive: Status post hysterectomy, with no abnormal findings of the vaginal cuff. No adnexal mass. Other: No pneumoperitoneum, ascites or focal fluid collection. Musculoskeletal: No aggressive appearing focal osseous lesions. Marked degenerative changes in the lower lumbar spine with laminectomies at L4 and L5. IMPRESSION: 1. No evidence of bowel obstruction or acute bowel inflammation. Minimal sigmoid diverticulosis, with no evidence of acute diverticulitis. 2. Hepatomegaly with severe diffuse hepatic steatosis. 3. Left lower lobe 4 mm pulmonary nodule. If the patient is at high risk for bronchogenic carcinoma, follow-up chest CT at 1 year is recommended. If the patient is at low risk, no follow-up is needed. This recommendation follows the consensus statement: Guidelines for Management of Small Pulmonary Nodules Detected on CT Scans: A Statement from the Fleischner Society as published in Radiology  2005; 237:395-400. 4. Tiny hiatal hernia. Electronically Signed   By: Delbert Phenix M.D.   On: 04/24/2015 21:02    Scheduled Meds: . aspirin EC  81 mg Oral q morning - 10a  . baclofen  10 mg Oral TID  . gabapentin  300 mg Oral TID  . heparin  5,000 Units Subcutaneous 3 times per day  . insulin aspart  0-15 Units Subcutaneous TID WC  . insulin detemir  10 Units Subcutaneous QHS  . pantoprazole  80 mg Oral Daily  . predniSONE  15 mg Oral Q breakfast  .  propranolol  20 mg Oral TID  . rOPINIRole  0.5 mg Oral BID  . saccharomyces boulardii  250 mg Oral BID  . sodium chloride  3 mL Intravenous Q12H  . sulfamethoxazole-trimethoprim  1 tablet Oral Q12H  . tamsulosin  0.4 mg Oral QHS  . traZODone  50 mg Oral QHS   Continuous Infusions: . sodium chloride 100 mL/hr at 04/26/15 1103    Sepsis due to UTI (lower urinary tract infection) Hypertension Altered mental status Quadriplegia (HCC) Chronic diastolic (congestive) heart failure (HCC) Occasional tremors    Time spent: 35 minutes.     Vassie LollMadera, Erskine Steinfeldt  [facility] Pager [phone]. If 7PM-7AM, please contact night-coverage at [email or web address], password [email or web address] 04/26/2015, 2:54 PM  LOS: 3 days

## 2015-04-27 DIAGNOSIS — G9340 Encephalopathy, unspecified: Secondary | ICD-10-CM | POA: Insufficient documentation

## 2015-04-27 DIAGNOSIS — A4151 Sepsis due to Escherichia coli [E. coli]: Secondary | ICD-10-CM | POA: Insufficient documentation

## 2015-04-27 LAB — BASIC METABOLIC PANEL
Anion gap: 9 (ref 5–15)
CALCIUM: 8.6 mg/dL — AB (ref 8.9–10.3)
CHLORIDE: 108 mmol/L (ref 101–111)
CO2: 22 mmol/L (ref 22–32)
CREATININE: 0.39 mg/dL — AB (ref 0.44–1.00)
GFR calc Af Amer: >60 mL/min (ref >60)
GFR calc non Af Amer: >60 mL/min (ref >60)
GLUCOSE: 135 mg/dL — AB (ref 65–99)
Potassium: 3.5 mmol/L (ref 3.5–5.1)
Sodium: 139 mmol/L (ref 135–145)

## 2015-04-27 LAB — GLUCOSE, CAPILLARY
Glucose-Capillary: 134 mg/dL — ABNORMAL HIGH (ref 65–99)
Glucose-Capillary: 210 mg/dL — ABNORMAL HIGH (ref 65–99)

## 2015-04-27 LAB — LACTIC ACID, PLASMA: Lactic Acid, Venous: 1.6 mmol/L (ref 0.5–2.0)

## 2015-04-27 MED ORDER — OMEPRAZOLE 20 MG PO CPDR: 40.0000 mg | DELAYED_RELEASE_CAPSULE | Freq: Every day | ORAL | Status: DC

## 2015-04-27 MED ORDER — SACCHAROMYCES BOULARDII 250 MG PO CAPS: 250.0000 mg | ORAL_CAPSULE | Freq: Two times a day (BID) | ORAL | Status: AC

## 2015-04-27 MED ORDER — HYDRALAZINE HCL 25 MG PO TABS: 25.0000 mg | ORAL_TABLET | Freq: Two times a day (BID) | ORAL | Status: DC

## 2015-04-27 MED ORDER — SULFAMETHOXAZOLE-TRIMETHOPRIM 800-160 MG PO TABS: 1.0000 | ORAL_TABLET | Freq: Two times a day (BID) | ORAL | Status: DC

## 2015-04-27 MED ORDER — GLIMEPIRIDE 2 MG PO TABS: 3.0000 mg | ORAL_TABLET | Freq: Every day | ORAL | Status: DC

## 2015-04-27 NOTE — Discharge Summary (Signed)
Physician Discharge Summary  [name] [name] NWG:[id number] DOB: 06/01/1946 DOA: 04/23/2015  PCP: [name], MD  Admit date: 04/23/2015 Discharge date: 04/27/2015  Time spent: 40 minutes  Recommendations for Outpatient Follow-up:  1. Repeat basic metabolic panel to follow renal function and electrolytes 2. Please reassess blood pressure and adjust antihypertensive medications 3. Close Follow-up patient's CBGs and further adjustment to her hypoglycemic regimen.  Discharge Diagnoses:  Active Problems:   UTI (lower urinary tract infection)   Hypertension   Altered mental status   Pyelonephritis   Quadriplegia (HCC)   Chronic diastolic (congestive) heart failure (HCC)   Occasional tremors   Sepsis due to Escherichia coli (HCC)   Acute encephalopathy   Discharge Condition: Stable and improved. Patient mentation at baseline and without any active signs of infection. She was instructed to finish taking antibiotics as prescribed, to keep herself well hydrated and to follow with her primary care physician in approx 10 days after being discharged.  Diet recommendation: Heart healthy diet and low carbohydrates  Filed Weights   04/23/15 2300  Weight: 88.225 kg (194 lb 8 oz)    History of present illness:  69 y.o. female  Level 5 caveat: pt poor historian at baseline. History provided by ED physician and son [name] [name]. Patient unable to provide any history at the time of admission.   Patient was discharged from the hospital on 04/20/2015 after treatment for UTI and acute encephalitis secondary to sepsis. Pt at baseline at that time per family. Urine cultures from the hospitalization showed Escherichia coli ESBL. Patient was discharged on 04/20/2015 with a prescription for nitrofurantoin with which she has been compliant. Pt more sleepy over past couple of days. PO at baseline until day of admission when pt has eaten very little. Patient became febrile again today to 101.5 and became confused.  Of note patient has some confusion at baseline but she is conservatively worse than at baseline.  Hospital Course:  Acute encephalopathy:  -In setting of infection.  -Encephalopathy was Resolved after receiving fluid resuscitation and treating her infection.  -Patient's mentation back to baseline at discharge   ESBL UTI; presents with fever, leukocytosis, UA with too numerous to count WBC.  -Urine cx positive for ESBL.  -Patient received treatment with IV Primaxin for 3 days and subsequently treated with Bactrim, with the plan for a total 10 days of antibiotics.  -At discharge patient was in need of 6 more days of Bactrim twice a day.    Sepsis due to ESBL UTI:  -Presented with confusion, leukocytosis, RR 22, lactic acidosis, temp of 101.5.  -continue current abx's therapy and complete 3 days of IV primaxin; then following ID rec's will treat with bactrim for 7 more days -After fluid resuscitation her lactic acid was within normal limits to discharge. 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 -Advised to watch the amount of carbohydrates in her diet  Quadriplegia: chronic. Contractures at baseline.  -follow rec's from PT/OT; okay to discharge home with family care and schedule private CNA 24 7  to assist in her care. - continue baclofen, neurontin  HTN: - continue propranolol, hydralazine (newly added) and torsemide  Chronic diastolic congestive heart failure: Echo from 2015 showing EF 65% and grade 1 diastolic  dysfunction. No evidence of acute decompensation. -condition remains compensated during this hospitalization -Patient advise to have her weight check on daily basis and to follow a low-sodium diet. Torsemide resumed at discharge -Patient was also started on hydralazine twice a day for better control of her blood pressure.  Insomnia: - Continue trazodone  Psychosis/Anxiety:  -Continue Requip -Mood is stable; no hallucinations or agitation  GERD: -continue ppi as previously prescribed  Tremors:  -continue propranolol -appears to be stable and well controlled  Hypokalemia;  -repleted and within normal limits at discharge   Procedures:  See below for x-ray reports  Consultations:  ID (Dr. Daiva Eves Curbside; recommended no tx for C. Diff colonization at this point and to switch to bactrim after 3 days of primaxin as part of treatment for her ESBL; plan is for a total of 10 days)  Discharge Exam: Filed Vitals:   04/27/15 1400  BP: 189/99  Pulse: 76  Temp: 99.6 F (37.6 C)  Resp:     General: Afebrile, feeling good and well oriented. No SOB and denies CP. No nausea, vomiting or any further diarrhea.  Cardiovascular: S1, S2, RRR  Respiratory: CTA bilaterally   Abdomen: BS present, distended (but according to patient this is her norm, since she started chronic use of prednisone), no guarding  Musculoskeletal: trace edema bilaterally   Discharge Instructions   Discharge Instructions    Diet - low sodium heart healthy    Complete by:  As directed      Discharge instructions    Complete by:  As directed   Take medications as prescribed Follow low sodium and low carbohydrates diet Please make sure to keep yourself well hydrated Arrange follow up with PCP in November as already arranged  Check blood sugar BID          Current Discharge Medication List    START taking these medications   Details  glimepiride (AMARYL) 2 MG tablet Take 1.5 tablets (3 mg  total) by mouth daily with breakfast. Qty: 45 tablet, Refills: 1    hydrALAZINE (APRESOLINE) 25 MG tablet Take 1 tablet (25 mg total) by mouth 2 (two) times daily. Qty: 60 tablet, Refills: 1    saccharomyces boulardii (FLORASTOR) 250 MG capsule Take 1 capsule (250 mg total) by mouth 2 (two) times daily. Qty: 60 capsule, Refills: 0    sulfamethoxazole-trimethoprim (BACTRIM DS,SEPTRA DS) 800-160 MG tablet Take 1 tablet by mouth every 12 (twelve) hours. Qty: 12 tablet, Refills: 0      CONTINUE these medications which have CHANGED   Details  omeprazole (PRILOSEC) 20 MG capsule Take 2 capsules (40 mg total) by mouth daily. Qty: 60 capsule, Refills: 1      CONTINUE these medications which have NOT CHANGED   Details  aspirin EC 81 MG tablet Take 81 mg by mouth every morning.     baclofen (LIORESAL) 10 MG tablet Take 10 mg by mouth 3 (three) times daily.    calcium-vitamin D (OSCAL WITH D) 500-200 MG-UNIT per tablet Take 1 tablet by mouth daily with breakfast.    ezetimibe (ZETIA) 10 MG tablet Take 10 mg by mouth every morning.     gabapentin (NEURONTIN) 300 MG capsule Take 300 mg by mouth 3 (three) times daily.    LORazepam (ATIVAN)  1 MG tablet Take 0.5 tablets (0.5 mg total) by mouth every 12 (twelve) hours as needed for anxiety. Qty: 20 tablet, Refills: 0    magnesium oxide (MAG-OX) 400 MG tablet Take 400 mg by mouth 2 (two) times daily.    Multiple Vitamins-Minerals ([employer] SPECTRAVITE ADULT 50+ PO) Take 1 tablet by mouth daily.    oxyCODONE (OXY IR/ROXICODONE) 5 MG immediate release tablet Take 1 tablet (5 mg total) by mouth every 8 (eight) hours as needed for severe pain. Qty: 30 tablet, Refills: 0    potassium chloride SA (K-DUR,KLOR-CON) 20 MEQ tablet Take 1 tablet (20 mEq total) by mouth 2 (two) times daily.    predniSONE (DELTASONE) 5 MG tablet Take 15 mg by mouth daily with breakfast.    propranolol (INDERAL) 20 MG tablet Take 20 mg by mouth 3 (three) times  daily. Refills: 4    rOPINIRole (REQUIP) 0.5 MG tablet Take 0.5 mg by mouth 2 (two) times daily.    tamsulosin (FLOMAX) 0.4 MG CAPS capsule Take 0.4 mg by mouth at bedtime.     torsemide (DEMADEX) 20 MG tablet Take 1 tablet (20 mg total) by mouth daily. Qty: 30 tablet, Refills: 0    traZODone (DESYREL) 50 MG tablet Take 50 mg by mouth at bedtime. Refills: 5    trifluoperazine (STELAZINE) 5 MG tablet Take 5 mg by mouth 2 (two) times daily as needed (for itching).      STOP taking these medications     polyethylene glycol (MIRALAX / GLYCOLAX) packet        Allergies  Allergen Reactions  . Decadrol [Dexamethasone] Swelling    Swelling of face and stomach   . Decadron [Dexamethasone] Swelling  . Sodium Phosphate Swelling    Swelling of face and stomach    Follow-up Information    Go to [name], MD.   Specialty:  Family Medicine   Why:  appointment with PCP as already arranged in [facility] information:   [address] [place] [address] [phone]       The results of significant diagnostics from this hospitalization (including imaging, microbiology, ancillary and laboratory) are listed below for reference.    Significant Diagnostic Studies: Dg Chest 2 View  04/23/2015  CLINICAL DATA:  Persistent fever following UTI EXAM: CHEST - 2 VIEW COMPARISON:  04/17/2015 FINDINGS: Cardiac shadow is within normal limits. The lungs are well aerated bilaterally. No focal infiltrate or sizable effusion is seen. No bony abnormality noted. IMPRESSION: No active disease. Electronically Signed   By: Alcide Clever M.D.   On: 04/23/2015 20:54   Ct Head Wo Contrast  04/17/2015  CLINICAL DATA:  Encephalopathy, possible opioid overdose, confusion EXAM: CT HEAD WITHOUT CONTRAST TECHNIQUE: Contiguous axial images were obtained from the base of the skull through the vertex without intravenous contrast. COMPARISON:  04/16/2014 FINDINGS: No evidence of parenchymal hemorrhage or  extra-axial fluid collection. No mass lesion, mass effect, or midline shift. No CT evidence of acute infarction. Subcortical white matter and periventricular small vessel ischemic changes. Mild intracranial atherosclerosis. Mild cortical atrophy.  No ventriculomegaly. The visualized paranasal sinuses are essentially clear. The mastoid air cells are unopacified. No evidence of calvarial fracture. IMPRESSION: No evidence of acute intracranial abnormality. Atrophy with small vessel ischemic changes. Electronically Signed   By: Charline Bills M.D.   On: 04/17/2015 18:36   Ct Angio Chest Pe W/cm &/or Wo Cm  04/17/2015  CLINICAL DATA:  Shortness of breath and hypoxia EXAM: CT  ANGIOGRAPHY CHEST WITH CONTRAST TECHNIQUE: Multidetector CT imaging of the chest was performed using the standard protocol during bolus administration of intravenous contrast. Multiplanar CT image reconstructions and MIPs were obtained to evaluate the vascular anatomy. CONTRAST:  OMNIPAQUE IOHEXOL 350 MG/ML SOLN COMPARISON:  None. FINDINGS: The lungs are well aerated bilaterally. Minimal atelectatic changes/ scarring is noted in the the lower lobes bilaterally. No focal confluent infiltrate is seen. A vague ground-glass nodule is seen measuring approximately 7 mm in greatest dimension. It lies within the right upper lobe and is best visualized on image number 36 of series 12. It appears to have tiny solid focus within measuring 1-2 mm. Some other smaller scattered ground-glass changes are noted within the lower lobe on the right. The thoracic inlet is within normal limits. Mild aortic calcifications are noted without aneurysmal dilatation or dissection. Mild coronary calcifications are noted as well. No significant hilar or mediastinal adenopathy is noted. Pulmonary artery demonstrates a normal branching pattern. No intraluminal filling defect to suggest pulmonary embolism is seen. The upper abdomen shows diffuse fatty infiltration of  the liver. No other focal abnormality is seen. No bony abnormality is noted. Review of the MIP images confirms the above findings. IMPRESSION: No evidence of pulmonary embolism. Vague ground-glass nodular densities in the right upper and right lower lobes as described. Follow-up CT in 3 months is recommended to assess for stability. If the lesions persist annual surveillance for 3 years is recommended. Fatty liver. Electronically Signed   By: Alcide Clever M.D.   On: 04/17/2015 15:54   Ct Abdomen Pelvis W Contrast  04/24/2015  CLINICAL DATA:  Abdominal distention. Admitted with urinary tract infection. Quadriplegic. EXAM: CT ABDOMEN AND PELVIS WITH CONTRAST TECHNIQUE: Multidetector CT imaging of the abdomen and pelvis was performed using the standard protocol following bolus administration of intravenous contrast. CONTRAST:  50mL OMNIPAQUE IOHEXOL 300 MG/ML SOLN, OMNIPAQUE IOHEXOL 300 MG/ML SOLN COMPARISON:  03/18/2007 CT abdomen/pelvis. FINDINGS: Lower chest: Subsegmental atelectasis in the bilateral lower lobe bases. Incompletely visualized 4 mm solid pulmonary nodule in the peripheral left lower lobe (series 5/ image 1), for which comparison cannot be made to the 2008 CT study as this portion of the lungs was not included on the study. Hepatobiliary: Hepatomegaly and severe diffuse hepatic steatosis. No liver mass. Normal gallbladder with no radiopaque cholelithiasis. No biliary ductal dilatation. Pancreas: Normal, with no mass or duct dilation. Spleen: Normal size. No mass. Adrenals/Urinary Tract: Normal adrenals. No hydronephrosis. Simple exophytic 4.1 cm renal cyst in the lower left kidney. There are innumerable (> than 10 bilaterally) subcentimeter hypodense lesions in both kidneys, too small to characterize, which are increased in number since 2008. Minimally distended and grossly normal bladder. Stomach/Bowel: Tiny hiatal hernia. Otherwise grossly normal stomach. Normal caliber small bowel with no  small bowel wall thickening. The appendix is not discretely visualized. Minimal sigmoid diverticulosis. No large bowel wall thickening or pericolonic fat stranding. Vascular/Lymphatic: Atherosclerotic nonaneurysmal abdominal aorta. Patent portal, splenic, hepatic and renal veins. No pathologically enlarged lymph nodes in the abdomen or pelvis. Reproductive: Status post hysterectomy, with no abnormal findings of the vaginal cuff. No adnexal mass. Other: No pneumoperitoneum, ascites or focal fluid collection. Musculoskeletal: No aggressive appearing focal osseous lesions. Marked degenerative changes in the lower lumbar spine with laminectomies at L4 and L5. IMPRESSION: 1. No evidence of bowel obstruction or acute bowel inflammation. Minimal sigmoid diverticulosis, with no evidence of acute diverticulitis. 2. Hepatomegaly with severe diffuse hepatic steatosis. 3. Left lower lobe  4 mm pulmonary nodule. If the patient is at high risk for bronchogenic carcinoma, follow-up chest CT at 1 year is recommended. If the patient is at low risk, no follow-up is needed. This recommendation follows the consensus statement: Guidelines for Management of Small Pulmonary Nodules Detected on CT Scans: A Statement from the Fleischner Society as published in Radiology 2005; 237:395-400. 4. Tiny hiatal hernia. Electronically Signed   By: Delbert Phenix M.D.   On: 04/24/2015 21:02   Dg Chest Portable 1 View  04/17/2015  CLINICAL DATA:  Short of breath.  Cough. EXAM: PORTABLE CHEST 1 VIEW COMPARISON:  01/22/2015 FINDINGS: Cardiac enlargement without heart failure. Mild bibasilar atelectasis. Negative for pneumonia IMPRESSION: Mild bibasilar atelectasis. Negative for heart failure or pneumonia. Electronically Signed   By: Marlan Palau M.D.   On: 04/17/2015 10:29    Microbiology: Recent Results (from the past 240 hour(s))  Urine C&S     Status: None   Collection Time: 04/23/15  7:40 PM  Result Value Ref Range Status   Specimen  Description URINE, CATHETERIZED  Final   Special Requests NONE  Final   Culture   Final    NO GROWTH 1 DAY Performed at [facility]    Report Status 04/24/2015 FINAL  Final  Blood culture (routine x 2)     Status: None (Preliminary result)   Collection Time: 04/23/15  7:40 PM  Result Value Ref Range Status   Specimen Description BLOOD RIGHT THUMB  Final   Special Requests BOTTLES DRAWN AEROBIC AND ANAEROBIC 5 ML  Final   Culture   Final    NO GROWTH 3 DAYS Performed at [facility]    Report Status PENDING  Incomplete  Blood culture (routine x 2)     Status: None (Preliminary result)   Collection Time: 04/23/15  7:48 PM  Result Value Ref Range Status   Specimen Description BLOOD LEFT THUMB  Final   Special Requests BOTTLES DRAWN AEROBIC AND ANAEROBIC 4 CC  Final   Culture   Final    NO GROWTH 3 DAYS Performed at [facility]    Report Status PENDING  Incomplete  MRSA PCR Screening     Status: None   Collection Time: 04/24/15 12:05 AM  Result Value Ref Range Status   MRSA by PCR NEGATIVE NEGATIVE Final    Comment:        The GeneXpert MRSA Assay (FDA approved for NASAL specimens only), is one component of a comprehensive MRSA colonization surveillance program. It is not intended to diagnose MRSA infection nor to guide or monitor treatment for MRSA infections.   C difficile quick scan w PCR reflex     Status: Abnormal   Collection Time: 04/25/15  9:37 AM  Result Value Ref Range Status   C Diff antigen POSITIVE (A) NEGATIVE Final   C Diff toxin NEGATIVE NEGATIVE Final   C Diff interpretation   Final    C. difficile present, but toxin not detected. This indicates colonization. In most cases, this does not require treatment. If patient has signs and symptoms consistent with colitis, consider treatment. Requires ENTERIC precautions.     Labs: Basic Metabolic Panel:  Recent Labs Lab 04/23/15 1949 04/24/15 1133 04/25/15 0525 04/26/15 0601  04/27/15 0540  NA 139 136 137 141 139  K 3.2* 2.7* 3.5 3.6 3.5  CL 93* 91* 98* 105 108  CO2 GLUCOSE 191* 464* 169* 180* 135*  BUN 16  11 9 7  <5*  CREATININE 0.66 0.66 0.44 0.44 0.39*  CALCIUM 9.1 8.3* 8.6* 8.8* 8.6*   Liver Function Tests:  Recent Labs Lab 04/23/15 1949  AST 50*  ALT 44  ALKPHOS 68  BILITOT 0.6  PROT 6.6  ALBUMIN 3.6   CBC:  Recent Labs Lab 04/23/15 1949 04/25/15 0525  WBC 13.6* 9.8  HGB 14.5 12.8  HCT 45.6 40.9  MCV 89.4 88.7  PLT 259 236   BNP (last 3 results)  Recent Labs  01/21/15 1648 04/17/15 1043  BNP 99.7 77.7   CBG:  Recent Labs Lab 04/26/15 1155 04/26/15 1658 04/26/15 2106 04/27/15 0801 04/27/15 1209  GLUCAP 261* 217* 182* 134* 210*    Signed:  Vassie LollMadera, Oma Marzan  [facility] 04/27/2015, 2:24 PM

## 2015-04-28 LAB — CULTURE, BLOOD (ROUTINE X 2)
CULTURE: NO GROWTH
Culture: NO GROWTH

## 2015-04-30 ENCOUNTER — Encounter (HOSPITAL_COMMUNITY): Payer: Self-pay | Admitting: Emergency Medicine

## 2015-04-30 ENCOUNTER — Emergency Department (HOSPITAL_COMMUNITY): Payer: Medicare Other

## 2015-04-30 ENCOUNTER — Observation Stay (HOSPITAL_COMMUNITY)
Admission: EM | Admit: 2015-04-30 | Discharge: 2015-05-03 | Disposition: A | Discharge status: Home / Self Care | Payer: Medicare Other | Attending: Internal Medicine | Admitting: Internal Medicine

## 2015-04-30 DIAGNOSIS — G9340 Encephalopathy, unspecified: Secondary | ICD-10-CM | POA: Diagnosis not present

## 2015-04-30 DIAGNOSIS — A4151 Sepsis due to Escherichia coli [E. coli]: Secondary | ICD-10-CM | POA: Diagnosis not present

## 2015-04-30 DIAGNOSIS — J690 Pneumonitis due to inhalation of food and vomit: Secondary | ICD-10-CM | POA: Diagnosis not present

## 2015-04-30 DIAGNOSIS — Z79899 Other long term (current) drug therapy: Secondary | ICD-10-CM | POA: Insufficient documentation

## 2015-04-30 DIAGNOSIS — E1165 Type 2 diabetes mellitus with hyperglycemia: Secondary | ICD-10-CM

## 2015-04-30 DIAGNOSIS — I5032 Chronic diastolic (congestive) heart failure: Secondary | ICD-10-CM | POA: Diagnosis not present

## 2015-04-30 DIAGNOSIS — E860 Dehydration: Secondary | ICD-10-CM | POA: Diagnosis not present

## 2015-04-30 DIAGNOSIS — E872 Acidosis, unspecified: Secondary | ICD-10-CM | POA: Diagnosis present

## 2015-04-30 DIAGNOSIS — N390 Urinary tract infection, site not specified: Secondary | ICD-10-CM | POA: Insufficient documentation

## 2015-04-30 DIAGNOSIS — Z87828 Personal history of other (healed) physical injury and trauma: Secondary | ICD-10-CM | POA: Insufficient documentation

## 2015-04-30 DIAGNOSIS — I110 Hypertensive heart disease with heart failure: Secondary | ICD-10-CM | POA: Diagnosis not present

## 2015-04-30 DIAGNOSIS — G8929 Other chronic pain: Secondary | ICD-10-CM | POA: Insufficient documentation

## 2015-04-30 DIAGNOSIS — Z87891 Personal history of nicotine dependence: Secondary | ICD-10-CM | POA: Diagnosis not present

## 2015-04-30 DIAGNOSIS — G8220 Paraplegia, unspecified: Secondary | ICD-10-CM

## 2015-04-30 DIAGNOSIS — K219 Gastro-esophageal reflux disease without esophagitis: Secondary | ICD-10-CM | POA: Diagnosis not present

## 2015-04-30 DIAGNOSIS — E119 Type 2 diabetes mellitus without complications: Secondary | ICD-10-CM

## 2015-04-30 DIAGNOSIS — G894 Chronic pain syndrome: Secondary | ICD-10-CM | POA: Diagnosis not present

## 2015-04-30 DIAGNOSIS — E785 Hyperlipidemia, unspecified: Secondary | ICD-10-CM | POA: Diagnosis not present

## 2015-04-30 DIAGNOSIS — R4182 Altered mental status, unspecified: Secondary | ICD-10-CM | POA: Diagnosis present

## 2015-04-30 DIAGNOSIS — Z7982 Long term (current) use of aspirin: Secondary | ICD-10-CM | POA: Diagnosis not present

## 2015-04-30 DIAGNOSIS — J9601 Acute respiratory failure with hypoxia: Secondary | ICD-10-CM | POA: Diagnosis not present

## 2015-04-30 DIAGNOSIS — I10 Essential (primary) hypertension: Secondary | ICD-10-CM | POA: Diagnosis present

## 2015-04-30 LAB — URINALYSIS, ROUTINE W REFLEX MICROSCOPIC
Bilirubin Urine: NEGATIVE
Glucose, UA: NEGATIVE mg/dL
Hgb urine dipstick: NEGATIVE
Ketones, ur: NEGATIVE mg/dL
Nitrite: NEGATIVE
Protein, ur: NEGATIVE mg/dL
Specific Gravity, Urine: 1.011 (ref 1.005–1.030)
Urobilinogen, UA: 0.2 mg/dL (ref 0.0–1.0)
pH: 6.5 (ref 5.0–8.0)

## 2015-04-30 LAB — BASIC METABOLIC PANEL
Anion gap: 11 (ref 5–15)
BUN: 14 mg/dL (ref 6–20)
CO2: 34 mmol/L — ABNORMAL HIGH (ref 22–32)
Calcium: 9 mg/dL (ref 8.9–10.3)
Chloride: 93 mmol/L — ABNORMAL LOW (ref 101–111)
Creatinine, Ser: 0.85 mg/dL (ref 0.44–1.00)
GFR calc Af Amer: >60 mL/min (ref >60)
GFR calc non Af Amer: >60 mL/min (ref >60)
Glucose, Bld: 217 mg/dL — ABNORMAL HIGH (ref 65–99)
Potassium: 4 mmol/L (ref 3.5–5.1)
Sodium: 138 mmol/L (ref 135–145)

## 2015-04-30 LAB — GLUCOSE, CAPILLARY: Glucose-Capillary: 219 mg/dL — ABNORMAL HIGH (ref 65–99)

## 2015-04-30 LAB — URINE MICROSCOPIC-ADD ON

## 2015-04-30 LAB — TROPONIN I: Troponin I: <0.03 ng/mL (ref <0.031)

## 2015-04-30 LAB — CBC WITH DIFFERENTIAL/PLATELET
Basophils Absolute: 0.1 10*3/uL (ref 0.0–0.1)
Basophils Relative: 1 %
Eosinophils Absolute: 0.2 10*3/uL (ref 0.0–0.7)
Eosinophils Relative: 1 %
HCT: 42.6 % (ref 36.0–46.0)
Hemoglobin: 13.3 g/dL (ref 12.0–15.0)
Lymphocytes Relative: 9 %
Lymphs Abs: 1.2 10*3/uL (ref 0.7–4.0)
MCH: 28.1 pg (ref 26.0–34.0)
MCHC: 31.2 g/dL (ref 30.0–36.0)
MCV: 90.1 fL (ref 78.0–100.0)
Monocytes Absolute: 0.5 10*3/uL (ref 0.1–1.0)
Monocytes Relative: 3 %
Neutro Abs: 12.4 10*3/uL — ABNORMAL HIGH (ref 1.7–7.7)
Neutrophils Relative %: 86 %
Platelets: 281 10*3/uL (ref 150–400)
RBC: 4.73 MIL/uL (ref 3.87–5.11)
RDW: 15.3 % (ref 11.5–15.5)
WBC: 14.4 10*3/uL — ABNORMAL HIGH (ref 4.0–10.5)

## 2015-04-30 LAB — BLOOD GAS, ARTERIAL
Acid-Base Excess: 5.8 mmol/L — ABNORMAL HIGH (ref 0.0–2.0)
Bicarbonate: 31.3 mEq/L — ABNORMAL HIGH (ref 20.0–24.0)
Drawn by: 441261
O2 Content: 4 L/min
O2 Saturation: 92.8 %
Patient temperature: 98.6
TCO2: 28 mmol/L (ref 0–100)
pCO2 arterial: 51.2 mmHg — ABNORMAL HIGH (ref 35.0–45.0)
pH, Arterial: 7.404 (ref 7.350–7.450)
pO2, Arterial: 80.6 mmHg (ref 80.0–100.0)

## 2015-04-30 LAB — BRAIN NATRIURETIC PEPTIDE: B Natriuretic Peptide: 46.4 pg/mL (ref 0.0–100.0)

## 2015-04-30 LAB — RAPID URINE DRUG SCREEN, HOSP PERFORMED
Amphetamines: NOT DETECTED
Barbiturates: NOT DETECTED
Benzodiazepines: POSITIVE — AB
Cocaine: NOT DETECTED
Opiates: POSITIVE — AB
Tetrahydrocannabinol: NOT DETECTED

## 2015-04-30 LAB — LACTIC ACID, PLASMA
LACTIC ACID, VENOUS: 4.8 mmol/L — AB (ref 0.5–2.0)
LACTIC ACID, VENOUS: 4.2 mmol/L — AB (ref 0.5–2.0)

## 2015-04-30 MED ORDER — ASPIRIN EC 81 MG PO TBEC
81.0000 mg | DELAYED_RELEASE_TABLET | Freq: Every morning | ORAL | Status: DC
  Administered 2015-05-01 – 2015-05-03 (×3): 81 mg via ORAL
  Filled 2015-04-30 (×3): qty 1

## 2015-04-30 MED ORDER — SODIUM CHLORIDE 0.9 % IV BOLUS (SEPSIS)
1000.0000 mL | Freq: Once | INTRAVENOUS | Status: AC
  Administered 2015-04-30: 1000 mL via INTRAVENOUS

## 2015-04-30 MED ORDER — GABAPENTIN 300 MG PO CAPS
300.0000 mg | ORAL_CAPSULE | Freq: Three times a day (TID) | ORAL | Status: DC
  Administered 2015-04-30 – 2015-05-03 (×8): 300 mg via ORAL
  Filled 2015-04-30 (×10): qty 1

## 2015-04-30 MED ORDER — PIPERACILLIN-TAZOBACTAM 3.375 G IVPB: 3.3750 g | Freq: Once | INTRAVENOUS | Status: DC

## 2015-04-30 MED ORDER — PIPERACILLIN-TAZOBACTAM 3.375 G IVPB 30 MIN: 3.3750 g | INTRAVENOUS | Status: DC

## 2015-04-30 MED ORDER — TRIFLUOPERAZINE HCL 5 MG PO TABS
5.0000 mg | ORAL_TABLET | Freq: Two times a day (BID) | ORAL | Status: DC | PRN
  Filled 2015-04-30: qty 1

## 2015-04-30 MED ORDER — MAGNESIUM OXIDE 400 (241.3 MG) MG PO TABS
400.0000 mg | ORAL_TABLET | Freq: Two times a day (BID) | ORAL | Status: DC
  Administered 2015-04-30 – 2015-05-03 (×6): 400 mg via ORAL
  Filled 2015-04-30 (×7): qty 1

## 2015-04-30 MED ORDER — CALCIUM CARBONATE-VITAMIN D 500-200 MG-UNIT PO TABS
1.0000 | ORAL_TABLET | Freq: Every day | ORAL | Status: DC
  Administered 2015-05-01 – 2015-05-03 (×3): 1 via ORAL
  Filled 2015-04-30 (×4): qty 1

## 2015-04-30 MED ORDER — ONDANSETRON HCL 4 MG PO TABS: 4.0000 mg | ORAL_TABLET | Freq: Four times a day (QID) | ORAL | Status: DC | PRN

## 2015-04-30 MED ORDER — TRAZODONE HCL 50 MG PO TABS
50.0000 mg | ORAL_TABLET | Freq: Every day | ORAL | Status: DC
  Administered 2015-04-30 – 2015-05-02 (×3): 50 mg via ORAL
  Filled 2015-04-30 (×4): qty 1

## 2015-04-30 MED ORDER — PIPERACILLIN-TAZOBACTAM 3.375 G IVPB 30 MIN
3.3750 g | INTRAVENOUS | Status: AC
  Administered 2015-04-30: 3.375 g via INTRAVENOUS

## 2015-04-30 MED ORDER — PREDNISONE 5 MG PO TABS
15.0000 mg | ORAL_TABLET | Freq: Every day | ORAL | Status: DC
  Administered 2015-05-01 – 2015-05-03 (×3): 15 mg via ORAL
  Filled 2015-04-30 (×4): qty 1

## 2015-04-30 MED ORDER — LORAZEPAM 0.5 MG PO TABS
0.5000 mg | ORAL_TABLET | Freq: Two times a day (BID) | ORAL | Status: DC | PRN
  Administered 2015-05-02 – 2015-05-03 (×2): 0.5 mg via ORAL
  Filled 2015-04-30 (×2): qty 1

## 2015-04-30 MED ORDER — EZETIMIBE 10 MG PO TABS
10.0000 mg | ORAL_TABLET | Freq: Every morning | ORAL | Status: DC
  Administered 2015-05-01 – 2015-05-03 (×3): 10 mg via ORAL
  Filled 2015-04-30 (×3): qty 1

## 2015-04-30 MED ORDER — HEPARIN SODIUM (PORCINE) 5000 UNIT/ML IJ SOLN
5000.0000 [IU] | Freq: Three times a day (TID) | INTRAMUSCULAR | Status: DC
  Administered 2015-04-30 – 2015-05-03 (×8): 5000 [IU] via SUBCUTANEOUS
  Filled 2015-04-30 (×11): qty 1

## 2015-04-30 MED ORDER — ACETAMINOPHEN 325 MG PO TABS: 650.0000 mg | ORAL_TABLET | Freq: Four times a day (QID) | ORAL | Status: DC | PRN

## 2015-04-30 MED ORDER — SACCHAROMYCES BOULARDII 250 MG PO CAPS
250.0000 mg | ORAL_CAPSULE | Freq: Two times a day (BID) | ORAL | Status: DC
  Administered 2015-04-30 – 2015-05-03 (×6): 250 mg via ORAL
  Filled 2015-04-30 (×7): qty 1

## 2015-04-30 MED ORDER — ACETAMINOPHEN 650 MG RE SUPP: 650.0000 mg | Freq: Four times a day (QID) | RECTAL | Status: DC | PRN

## 2015-04-30 MED ORDER — VANCOMYCIN HCL IN DEXTROSE 1-5 GM/200ML-% IV SOLN: 1000.0000 mg | INTRAVENOUS | Status: DC

## 2015-04-30 MED ORDER — SODIUM CHLORIDE 0.9 % IV BOLUS (SEPSIS)
500.0000 mL | Freq: Once | INTRAVENOUS | Status: AC
  Administered 2015-04-30: 500 mL via INTRAVENOUS

## 2015-04-30 MED ORDER — TAMSULOSIN HCL 0.4 MG PO CAPS
0.4000 mg | ORAL_CAPSULE | Freq: Every day | ORAL | Status: DC
  Administered 2015-04-30 – 2015-05-02 (×3): 0.4 mg via ORAL
  Filled 2015-04-30 (×4): qty 1

## 2015-04-30 MED ORDER — OXYMORPHONE HCL ER 20 MG PO TB12
20.0000 mg | ORAL_TABLET | Freq: Two times a day (BID) | ORAL | Status: DC
  Administered 2015-04-30 – 2015-05-03 (×6): 20 mg via ORAL
  Filled 2015-04-30 (×6): qty 1

## 2015-04-30 MED ORDER — PANTOPRAZOLE SODIUM 40 MG PO TBEC
40.0000 mg | DELAYED_RELEASE_TABLET | Freq: Two times a day (BID) | ORAL | Status: DC
  Administered 2015-04-30 – 2015-05-03 (×6): 40 mg via ORAL
  Filled 2015-04-30 (×8): qty 1

## 2015-04-30 MED ORDER — GLIMEPIRIDE 2 MG PO TABS
3.0000 mg | ORAL_TABLET | Freq: Every day | ORAL | Status: DC
  Administered 2015-05-01 – 2015-05-03 (×3): 3 mg via ORAL
  Filled 2015-04-30 (×4): qty 1

## 2015-04-30 MED ORDER — BACLOFEN 10 MG PO TABS
10.0000 mg | ORAL_TABLET | Freq: Two times a day (BID) | ORAL | Status: DC
  Administered 2015-04-30 – 2015-05-03 (×6): 10 mg via ORAL
  Filled 2015-04-30 (×7): qty 1

## 2015-04-30 MED ORDER — VANCOMYCIN HCL IN DEXTROSE 1-5 GM/200ML-% IV SOLN
1000.0000 mg | Freq: Two times a day (BID) | INTRAVENOUS | Status: DC
  Filled 2015-04-30: qty 200

## 2015-04-30 MED ORDER — ROPINIROLE HCL 0.5 MG PO TABS
0.5000 mg | ORAL_TABLET | ORAL | Status: DC
  Administered 2015-04-30 – 2015-05-02 (×5): 0.5 mg via ORAL
  Filled 2015-04-30 (×7): qty 1

## 2015-04-30 MED ORDER — NALOXONE HCL 0.4 MG/ML IJ SOLN
0.4000 mg | Freq: Once | INTRAMUSCULAR | Status: AC
  Administered 2015-04-30: 0.4 mg via INTRAVENOUS
  Filled 2015-04-30: qty 1

## 2015-04-30 MED ORDER — PIPERACILLIN-TAZOBACTAM 3.375 G IVPB
3.3750 g | Freq: Three times a day (TID) | INTRAVENOUS | Status: DC
Start: 2015-05-01 — End: 2015-05-02
  Administered 2015-05-01: 3.375 g via INTRAVENOUS
  Filled 2015-04-30 (×6): qty 50

## 2015-04-30 MED ORDER — ONDANSETRON HCL 4 MG/2ML IJ SOLN: 4.0000 mg | Freq: Four times a day (QID) | INTRAMUSCULAR | Status: DC | PRN

## 2015-04-30 MED ORDER — INSULIN ASPART 100 UNIT/ML ~~LOC~~ SOLN
0.0000 [IU] | Freq: Three times a day (TID) | SUBCUTANEOUS | Status: DC
  Administered 2015-05-01 (×2): 3 [IU] via SUBCUTANEOUS
  Administered 2015-05-02: 8 [IU] via SUBCUTANEOUS
  Administered 2015-05-02: 3 [IU] via SUBCUTANEOUS
  Administered 2015-05-03: 8 [IU] via SUBCUTANEOUS

## 2015-04-30 MED ORDER — SODIUM CHLORIDE 0.9 % IV SOLN
INTRAVENOUS | Status: DC
  Administered 2015-04-30 – 2015-05-03 (×3): via INTRAVENOUS

## 2015-04-30 MED ORDER — PROPRANOLOL HCL 20 MG PO TABS
20.0000 mg | ORAL_TABLET | Freq: Two times a day (BID) | ORAL | Status: DC
  Administered 2015-04-30 – 2015-05-03 (×6): 20 mg via ORAL
  Filled 2015-04-30 (×7): qty 1

## 2015-04-30 MED ORDER — INSULIN ASPART 100 UNIT/ML ~~LOC~~ SOLN
0.0000 [IU] | Freq: Every day | SUBCUTANEOUS | Status: DC
  Administered 2015-04-30: 2 [IU] via SUBCUTANEOUS

## 2015-04-30 NOTE — Progress Notes (Signed)
ANTIBIOTIC CONSULT NOTE - INITIAL  Pharmacy Consult for Zosyn Indication: Aspiration pneumonia   Allergies  Allergen Reactions  . Decadrol [Dexamethasone] Swelling    Swelling of face and stomach   . Sodium Phosphate Swelling    Swelling of face and stomach     Patient Measurements:    Vital Signs: Temp: 99.9 F (37.7 C) (10/24 1548) Temp Source: Oral (10/24 1721) BP: 112/53 mmHg (10/24 1929) Pulse Rate: 74 (10/24 1929) Intake/Output from previous day:   Intake/Output from this shift:    Labs:  Recent Labs  04/30/15 1120  WBC 14.4*  HGB 13.3  PLT 281  CREATININE 0.85   Estimated Creatinine Clearance: 63.5 mL/min (by C-G formula based on Cr of 0.85). No results for input(s): VANCOTROUGH, VANCOPEAK, VANCORANDOM, GENTTROUGH, GENTPEAK, GENTRANDOM, TOBRATROUGH, TOBRAPEAK, TOBRARND, AMIKACINPEAK, AMIKACINTROU, AMIKACIN in the last 72 hours.   Microbiology: Recent Results (from the past 720 hour(s))  Urine culture     Status: None   Collection Time: 04/17/15  1:18 PM  Result Value Ref Range Status   Specimen Description URINE, CATHETERIZED  Final   Special Requests NONE  Final   Culture   Final    >=100,000 COLONIES/mL ESCHERICHIA COLI Confirmed Extended Spectrum Beta-Lactamase Producer (ESBL) Performed at [facility]    Report Status 04/19/2015 FINAL  Final   Organism ID, Bacteria ESCHERICHIA COLI  Final      Susceptibility   Escherichia coli - MIC*    AMPICILLIN >=32 RESISTANT Resistant     CEFAZOLIN >=64 RESISTANT Resistant     CEFTRIAXONE >=64 RESISTANT Resistant     CIPROFLOXACIN >=4 RESISTANT Resistant     GENTAMICIN <=1 SENSITIVE Sensitive     IMIPENEM <=0.25 SENSITIVE Sensitive     NITROFURANTOIN <=16 SENSITIVE Sensitive     TRIMETH/SULFA <=20 SENSITIVE Sensitive     AMPICILLIN/SULBACTAM 16 INTERMEDIATE Intermediate     PIP/TAZO 8 SENSITIVE Sensitive     * >=100,000 COLONIES/mL ESCHERICHIA COLI  Urine C&S     Status: None   Collection Time:  04/23/15  7:40 PM  Result Value Ref Range Status   Specimen Description URINE, CATHETERIZED  Final   Special Requests NONE  Final   Culture   Final    NO GROWTH 1 DAY Performed at [facility]    Report Status 04/24/2015 FINAL  Final  Blood culture (routine x 2)     Status: None   Collection Time: 04/23/15  7:40 PM  Result Value Ref Range Status   Specimen Description BLOOD RIGHT THUMB  Final   Special Requests BOTTLES DRAWN AEROBIC AND ANAEROBIC 5 ML  Final   Culture   Final    NO GROWTH 5 DAYS [facility]formed at [facility]    Report Status 04/28/2015 FINAL  Final  Blood culture (routine x 2)     Status: None   Collection Time: 04/23/15  7:48 PM  Result Value Ref Range Status   Specimen Description BLOOD LEFT THUMB  Final   Special Requests BOTTLES DRAWN AEROBIC AND ANAEROBIC 4 CC  Final   Culture   Final    NO GROWTH 5 DAYS [facility]    Report Status 04/28/2015 FINAL  Final  MRSA PCR Screening     Status: None   Collection Time: 04/24/15 12:05 AM  Result Value Ref Range Status   MRSA by PCR NEGATIVE NEGATIVE Final    Comment:        The GeneXpert MRSA Assay (FDA  approved for NASAL specimens only), is one component of a comprehensive MRSA colonization surveillance program. It is not intended to diagnose MRSA infection nor to guide or monitor treatment for MRSA infections.   C difficile quick scan w PCR reflex     Status: Abnormal   Collection Time: 04/25/15  9:37 AM  Result Value Ref Range Status   C Diff antigen POSITIVE (A) NEGATIVE Final   C Diff toxin NEGATIVE NEGATIVE Final   C Diff interpretation   Final    C. difficile present, but toxin not detected. This indicates colonization. In most cases, this does not require treatment. If patient has signs and symptoms consistent with colitis, consider treatment. Requires ENTERIC precautions.    Medical History: Past Medical History  Diagnosis Date  . GERD (gastroesophageal  reflux disease)   . Quadriplegia (HCC)   . Hypertension   . Pressure ulcer of foot   . Chronic pain disorder   . Hyperlipidemia   . UTI (urinary tract infection)     reoccuring  . Occasional tremors   . CHF (congestive heart failure) (HCC)      Assessment:  69 yr female with SOB, lethargy, elevated WBC, hypotensive  Initially in ED Zosyn 3.375gm IV x 1 ordered by MD and pharmacy consulted to dose Vancomycin for sepsis.  ED physician then d/c'ed antibiotics at noted request of admitted physician.  Patient did not receive any antibiotics.  Upon admission, physician has requested pharmacy to dose Zosyn for aspiration pneumonia (as well as provide coverage for recent UTI)  Blood and urine cultures ordered  10/24 >>zosyn >>   10/24 blood: 10/24 urine:  Goal of Therapy:  Eradication of infection  Plan:  Zosyn 3.375gm IV q8h (each dose infused over 4 hrs) F/u cultures   Raini Tiley, Joselyn Glassman, PharmD 04/30/2015,7:39 PM

## 2015-04-30 NOTE — ED Notes (Signed)
Son called and left #. Ruel FavorsJim Poe- # [phone].

## 2015-04-30 NOTE — H&P (Signed)
[facility] History and Physical  [name] MeekerLinda Rolison ZOX:[id number] DOB: December 07, 1945 DOA: 04/30/2015  Referring physician: Dr. Juleen ChinaKohut PCP: [name], Samuel, MD   Chief Complaint: Abdomen to status (appears to be secondary to misuse of narcotics).  HPI: [name] [name] is a 69 y.o. 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 Who came to the emergency department secondary to being found unresponsive by caregiver. According to history collected by EMS and emergency department physician, patient around lunch time and was complaining of pain for what she received more pain medication after already getting her morning dose of Opana; patient finished eating and went to bed. When the caregiver went to check on her multiple hours later found the patient to be unresponsive and with severe apnea (breathing only 6 times per minute). EMS was called and on arrival found the patient to be mildly hypoxic; patient received Narcan with excellent response. Per EMS she was also having significant rhonchi and mild wheezing for which she received 125 mg of Solu-Medrol and was started on albuterol nebulization. In day the patient blood pressure was on the soft side mild elevation of her WBCs of 14,000 range and even more alert still a little bit confused. [facility] has been called to admit the patient for further evaluation and treatment. Chest x-ray demonstrated no acute abnormalities. Lactic acid was 4.8.  Of note, patient denies fever, chills, abdominal pain, dysuria, nausea, vomiting, headaches, chest pain, cough or any other acute complaints.  Review of Systems:  Negative except as otherwise mentioned in history of present illness.  Past Medical History  Diagnosis Date  . GERD (gastroesophageal reflux disease)   .  Quadriplegia (HCC)   . Hypertension   . Pressure ulcer of foot   . Chronic pain disorder   . Hyperlipidemia   . UTI (urinary tract infection)     reoccuring  . Occasional tremors   . CHF (congestive heart failure) [facility](HCC)    Past Surgical History  Procedure Laterality Date  . Back surgery  08/2013  . Abdominal hysterectomy     Social History:  reports that she has quit smoking. Her smoking use included Cigarettes. She smoked 0.25 packs per day. She has never used smokeless tobacco. She reports that she does not drink alcohol or use illicit drugs.  Allergies  Allergen Reactions  . Decadrol [Dexamethasone] Swelling    Swelling of face and stomach   . Sodium Phosphate Swelling    Swelling of face and stomach     Family History  Problem Relation Age of Onset  . Adopted: Yes    Prior to Admission medications   Medication Sig Start Date End Date Taking? Authorizing Provider  aspirin EC 81 MG tablet Take 81 mg by mouth every morning.    Yes Historical Provider, MD  baclofen (LIORESAL) 10 MG tablet Take 10 mg by mouth 3 (three) times daily.   Yes Historical Provider, MD  calcium-vitamin D (OSCAL WITH D) 500-200 MG-UNIT per tablet Take 1 tablet by mouth daily with breakfast.   Yes Historical Provider, MD  ezetimibe (ZETIA) 10 MG tablet Take 10 mg by mouth every morning.    Yes Historical Provider, MD  gabapentin (NEURONTIN) 300 MG capsule Take 300 mg by mouth 3 (three) times daily.   Yes Historical Provider, MD  glimepiride (AMARYL) 2 MG tablet Take 1.5 tablets (3 mg total)  by mouth daily with breakfast. 04/27/15  Yes Vassie Loll, MD  hydrALAZINE (APRESOLINE) 25 MG tablet Take 1 tablet (25 mg total) by mouth 2 (two) times daily. 04/27/15  Yes Vassie Loll, MD  LORazepam (ATIVAN) 1 MG tablet Take 0.5 tablets (0.5 mg total) by mouth every 12 (twelve) hours as needed for anxiety. Patient taking differently: Take 1 mg by mouth 4 (four) times daily.  04/18/14  Yes Meredeth Ide, MD    magnesium oxide (MAG-OX) 400 MG tablet Take 400 mg by mouth 2 (two) times daily.   Yes Historical Provider, MD  Multiple Vitamins-Minerals ([employer] SPECTRAVITE ADULT 50+ PO) Take 1 tablet by mouth daily.   Yes Historical Provider, MD  omeprazole (PRILOSEC) 20 MG capsule Take 2 capsules (40 mg total) by mouth daily. Patient taking differently: Take 20 mg by mouth daily.  04/27/15  Yes Vassie Loll, MD  oxymorphone (OPANA ER) 20 MG 12 hr tablet Take 20-40 mg by mouth as directed. Take 40 mg Twice daily and may take 1 extra daily if needed for pain.   Yes Historical Provider, MD  potassium chloride SA (K-DUR,KLOR-CON) 20 MEQ tablet Take 1 tablet (20 mEq total) by mouth 2 (two) times daily. Patient taking differently: Take 20 mEq by mouth 3 (three) times daily.  04/18/14  Yes Meredeth Ide, MD  predniSONE (DELTASONE) 5 MG tablet Take 15 mg by mouth daily with breakfast.   Yes Historical Provider, MD  propranolol (INDERAL) 20 MG tablet Take 20 mg by mouth 3 (three) times daily. 04/14/15  Yes Historical Provider, MD  rOPINIRole (REQUIP) 0.5 MG tablet Take 0.5 mg by mouth 2 (two) times daily. Give 1 tablet 1-3 hours before bedtime (around 1900) and than 1 tablet at bedtime (around 2200)   Yes Historical Provider, MD  saccharomyces boulardii (FLORASTOR) 250 MG capsule Take 1 capsule (250 mg total) by mouth 2 (two) times daily. 04/27/15  Yes Vassie Loll, MD  sulfamethoxazole-trimethoprim (BACTRIM DS,SEPTRA DS) 800-160 MG tablet Take 1 tablet by mouth every 12 (twelve) hours. 04/27/15  Yes Vassie Loll, MD  tamsulosin (FLOMAX) 0.4 MG CAPS capsule Take 0.4 mg by mouth at bedtime.    Yes Historical Provider, MD  temazepam (RESTORIL) 15 MG capsule Take 15 mg by mouth at bedtime.   Yes Historical Provider, MD  torsemide (DEMADEX) 20 MG tablet Take 1 tablet (20 mg total) by mouth daily. Patient taking differently: Take 40 mg by mouth 2 (two) times daily.  04/20/15  Yes Jeralyn Bennett, MD  traZODone (DESYREL) 50 MG  tablet Take 50 mg by mouth at bedtime. 04/14/15  Yes Historical Provider, MD  trifluoperazine (STELAZINE) 5 MG tablet Take 5 mg by mouth 2 (two) times daily as needed (for itching).   Yes Historical Provider, MD   Physical Exam: Filed Vitals:   04/30/15 1343 04/30/15 1500 04/30/15 1548 04/30/15 1721  BP: 112/49 91/41  100/70  Pulse: 84 85  84  Temp:   99.9 F (37.7 C)   TempSrc:   Rectal Oral  Resp: 16 15  16   SpO2: 95% 94%  98%    Wt Readings from Last 3 Encounters:  04/23/15 88.225 kg (194 lb 8 oz)  04/17/15 84.188 kg (185 lb 9.6 oz)  01/28/15 85.724 kg (188 lb 15.8 oz)    General:  Appears calm and comfortable; and at the moment of my examination patient was alert, awake and oriented 3. Afebrile. Able to answer questions appropriately and speaking in full sentences. Eyes: PERRL, normal  lids, irises & conjunctiva, no icterus or nystagmus ENT: grossly normal hearing, dry mucous membranes appreciated on her lips & tongue; no erythema, no exudates, no thrush. No drainage out of her ears or nostrils Neck: no LAD, masses or thyromegaly, no JVD Cardiovascular: RRR, no murmurs, no gallops. Trace LE edema. Respiratory: Good air movement, scattered rhonchi; no wheezing. Patient is not using accessory muscles Abdomen: soft, nontender, positive bowel sounds Skin: Rash especially on her upper extremities and lower extremities, no open wounds appreciated currently.  Musculoskeletal: Trace edema bilaterally, some atrophy and contractions appreciated Psychiatric: grossly normal mood and affect, speech fluent and appropriate Neurologic: grossly non-focal. With unchanged chronic neurologic deficit from prior paraplegia           Labs on Admission:  Basic Metabolic Panel:  Recent Labs Lab 04/24/15 1133 04/25/15 0525 04/26/15 0601 04/27/15 0540 04/30/15 1120  NA 136 137 141 139 138  K 2.7* 3.5 3.6 3.5 4.0  CL 91* 98* 105 108 93*  CO2 34*  GLUCOSE 464* 169* 180* 135* 217*    BUN <5* 14  CREATININE 0.66 0.44 0.44 0.39* 0.85  CALCIUM 8.3* 8.6* 8.8* 8.6* 9.0   Liver Function Tests:  Recent Labs Lab 04/23/15 1949  AST 50*  ALT 44  ALKPHOS 68  BILITOT 0.6  PROT 6.6  ALBUMIN 3.6   CBC:  Recent Labs Lab 04/23/15 1949 04/25/15 0525 04/30/15 1120  WBC 13.6* 9.8 14.4*  NEUTROABS  --   --  12.4*  HGB 14.5 12.8 13.3  HCT 45.6 40.9 42.6  MCV 89.4 88.7 90.1  PLT 259 236 281   Cardiac Enzymes:  Recent Labs Lab 04/30/15 1120  TROPONINI <0.03    BNP (last 3 results)  Recent Labs  01/21/15 1648 04/17/15 1043 04/30/15 1120  BNP 99.7 77.7 46.4    CBG:  Recent Labs Lab 04/26/15 1155 04/26/15 1658 04/26/15 2106 04/27/15 0801 04/27/15 1209  GLUCAP 261* 217* 182* 134* 210*    Radiological Exams on Admission: Dg Chest 2 View  04/30/2015  CLINICAL DATA:  Decreased responsiveness with wheezing and productive cough over the past few days. EXAM: CHEST  2 VIEW COMPARISON:  PA and lateral chest 04/23/2015.  CT chest 04/17/2015. FINDINGS: The lungs are clear. Heart size is normal. No pneumothorax or pleural effusion. No focal bony abnormality. IMPRESSION: No acute disease. Electronically Signed   By: Drusilla Kanner M.D.   On: 04/30/2015 11:03    EKG:  None  Assessment/Plan 1-Altered mental status: due to non-intentional narcotic overuse  -patient reported to be in pain around lunch tim; after she has taken her dose of opana; care giver following home medication instruction gave her more pain medication and once patient finish eating went to bed. Was then found unresponsive and breathing 6 X per minute. EMS found her hypoxic. Patient with excellent response to narcan. -on my evaluation was AAOX3 and appropriate -will monitor and minimize narcotics -patient has chronically been on pain meds due to her paraplegia and chronic pain -will provide IVF's and observe response  2-elevated lactic acid: due to hypotension and  dehydration -patient on diuretics and has also been experiencing elevated CBG's, with associated increase urine output and constant thirst  -will provide IVF's -other concerns is potential aspiration -will cover with zosyn (which would also provide tx for her recent UTI)  3-recent admission for sepsis due to E. Coli UTI -was still on treatment with Bactrim  -Denies dysuria or abdominal  pain -Given concern for potential aspiration patient will be covered with Zosyn (based on prior culture Micronase and was also sensitive to it).   4-Paraplegia following spinal cord injury (HCC) -Continue supportive care -Continue when necessary baclofen  5-Diabetes [facility]): Elevated CBGs and elevated A1c (9.8) on prior admission -Will continue home glimepiride and will also start sliding scale insulin  6-Chronic pain disorder: Will continue a lower dose of her Opana and no other narcotic medication.  7-Hypertension: Currently soft but stable. -Will hold antihypertensive medications (especially diuretics) overnight -Will provide fluid resuscitation  8-Chronic diastolic (congestive) heart failure [facility]): Patient denies any orthopnea -Will be holding diuretics overnight -Continue beta blocker -Strict intake and output -Daily weights  9-Lactic acidosis: Will provide fluid resuscitation -Will hold antihypertensive agents -Will follow lactic acid trend  10-Aspiration pneumonitis (HCC): As mentioned above will call with Zosyn -Patient with good oxygen saturation currently will wean O2 supplementation  Code Status: Full code  DVT Prophylaxis:heaprin  Family Communication: no family at bedside Disposition Plan: observation, LOS < 2 midnights, med-surg  Time spent: 60 minutes  Vassie Loll [facility] Pager [phone]

## 2015-04-30 NOTE — ED Notes (Signed)
Nurse drawing labs. 

## 2015-04-30 NOTE — ED Notes (Signed)
attempt to draw labs was unable to collect

## 2015-04-30 NOTE — ED Notes (Signed)
MD at bedside. 

## 2015-04-30 NOTE — ED Notes (Signed)
Phlebotomy unable to get blood from Pt x2.  RN attempted x1 also.  Phlebotomy have been notified for another person to attempt.

## 2015-04-30 NOTE — ED Notes (Signed)
Bed: [id number] Expected date:  Expected time:  Means of arrival:  Comments: EMS- 69yo F, lethargic/SOB

## 2015-04-30 NOTE — ED Notes (Addendum)
Pt from home. Pt woke up, ate breakfast this am then went back to bed. Home health aide found pt unresponsive, breathing 6x a minute. Pt given 0.5mg  narcan IV which woke pt back up. Pt alert and oriented at present. EMS also noted wheezing and rhonchi. Gave 125mg  solu-medrol and 5 albuterol. O2 sat 88% on RA on arrival. Pt just starting EMS duoneb. Denies any pain. Pt coughed up brown sputum after first breathing tx.

## 2015-04-30 NOTE — ED Notes (Signed)
Dr. Ottie GlazierMedera notified of Pts Lactic Acid (4.8).

## 2015-04-30 NOTE — Progress Notes (Signed)
ANTIBIOTIC CONSULT NOTE - INITIAL  Pharmacy Consult for Vancomycin Indication: Sepsis  Allergies  Allergen Reactions  . Decadrol [Dexamethasone] Swelling    Swelling of face and stomach   . Sodium Phosphate Swelling    Swelling of face and stomach     Patient Measurements:    Vital Signs: Temp: 99.9 F (37.7 C) (10/24 1548) Temp Source: Rectal (10/24 1548) BP: 91/41 mmHg (10/24 1500) Pulse Rate: 85 (10/24 1500) Intake/Output from previous day:   Intake/Output from this shift: Total I/O In: -  Out: 500 [Urine:500]  Labs:  Recent Labs  04/30/15 1120  WBC 14.4*  HGB 13.3  PLT 281  CREATININE 0.85   Estimated Creatinine Clearance: 63.5 mL/min (by C-G formula based on Cr of 0.85). No results for input(s): VANCOTROUGH, VANCOPEAK, VANCORANDOM, GENTTROUGH, GENTPEAK, GENTRANDOM, TOBRATROUGH, TOBRAPEAK, TOBRARND, AMIKACINPEAK, AMIKACINTROU, AMIKACIN in the last 72 hours.   Microbiology: Recent Results (from the past 720 hour(s))  Urine culture     Status: None   Collection Time: 04/17/15  1:18 PM  Result Value Ref Range Status   Specimen Description URINE, CATHETERIZED  Final   Special Requests NONE  Final   Culture   Final    >=100,000 COLONIES/mL ESCHERICHIA COLI Confirmed Extended Spectrum Beta-Lactamase Producer (ESBL) [facility]formed [facility]    Report Status 04/19/2015 FINAL  Final   Organism ID, Bacteria ESCHERICHIA COLI  Final      Susceptibility   Escherichia coli - MIC*    AMPICILLIN >=32 RESISTANT Resistant     CEFAZOLIN >=64 RESISTANT Resistant     CEFTRIAXONE >=64 RESISTANT Resistant     CIPROFLOXACIN >=4 RESISTANT Resistant     GENTAMICIN <=1 SENSITIVE Sensitive     IMIPENEM <=0.25 SENSITIVE Sensitive     NITROFURANTOIN <=16 SENSITIVE Sensitive     TRIMETH/SULFA <=20 SENSITIVE Sensitive     AMPICILLIN/SULBACTAM 16 INTERMEDIATE Intermediate     PIP/TAZO 8 SENSITIVE Sensitive     * >=100,000 COLONIES/mL ESCHERICHIA COLI  Urine C&S      Status: None   Collection Time: 04/23/15  7:40 PM  Result Value Ref Range Status   Specimen Description URINE, CATHETERIZED  Final   Special Requests NONE  Final   Culture   Final    NO GROWTH 1 DAY [facility]formed at [facility] [place]    Report Status 04/24/2015 FINAL  Final  Blood culture (routine x 2)     Status: None   Collection Time: 04/23/15  7:40 PM  Result Value Ref Range Status   Specimen Description BLOOD RIGHT THUMB  Final   Special Requests BOTTLES DRAWN AEROBIC AND ANAEROBIC 5 ML  Final   Culture   Final    NO GROWTH 5 DAYS Performed at [facility]    Report Status 04/28/2015 FINAL  Final  Blood culture (routine x 2)     Status: None   Collection Time: 04/23/15  7:48 PM  Result Value Ref Range Status   Specimen Description BLOOD LEFT THUMB  Final   Special Requests BOTTLES DRAWN AEROBIC AND ANAEROBIC 4 CC  Final   Culture   Final    NO GROWTH 5 DAYS [facility]    Report Status 04/28/2015 FINAL  Final  MRSA PCR Screening     Status: None   Collection Time: 04/24/15 12:05 AM  Result Value Ref Range Status   MRSA by PCR NEGATIVE NEGATIVE Final    Comment:        The  GeneXpert MRSA Assay (FDA approved for NASAL specimens only), is one component of a comprehensive MRSA colonization surveillance program. It is not intended to diagnose MRSA infection nor to guide or monitor treatment for MRSA infections.   C difficile quick scan w PCR reflex     Status: Abnormal   Collection Time: 04/25/15  9:37 AM  Result Value Ref Range Status   C Diff antigen POSITIVE (A) NEGATIVE Final   C Diff toxin NEGATIVE NEGATIVE Final   C Diff interpretation   Final    C. difficile present, but toxin not detected. This indicates colonization. In most cases, this does not require treatment. If patient has signs and symptoms consistent with colitis, consider treatment. Requires ENTERIC precautions.    Medical History: Past Medical History  Diagnosis Date   . GERD (gastroesophageal reflux disease)   . Quadriplegia (HCC)   . Hypertension   . Pressure ulcer of foot   . Chronic pain disorder   . Hyperlipidemia   . UTI (urinary tract infection)     reoccuring  . Occasional tremors   . CHF (congestive heart failure) (HCC)     Medications:  Scheduled:   Infusions:  . piperacillin-tazobactam    . vancomycin    . [START ON 05/01/2015] vancomycin     Assessment:  69 yr female with SOB, lethargy  Elevated WBC, hypotensive  Zosyn 3.375gm IV x 1 ordered in MD  Pharmacy consulted to dose Vancomycin for sepsis  Blood and urine cultures ordered  Goal of Therapy:  Vancomycin trough level 15-20 mcg/ml  Plan:  Measure antibiotic drug levels at steady state Follow up culture results  Vancomycin 1gm IV q12h  Kashawna Manzer, Joselyn Glassman, PharmD 04/30/2015,4:01 PM

## 2015-04-30 NOTE — ED Provider Notes (Signed)
CSN: [id number]     Arrival date & time 04/30/15  5638 History   First MD Initiated Contact with Patient 04/30/15 1039     Chief Complaint  Patient presents with  . Shortness of Breath  . unresponsive episode, responded to narcan      (Consider location/radiation/quality/duration/timing/severity/associated sxs/prior Treatment) HPI   69 year old female coming from home for evaluation decreased mental status. Apparently home health aide found the patient poorly responsive this morning. Unsure time last seen normal. Apparently she was breathing without 6 times per minute. EMS gave her Narcan IV on their arrival apparently had a wrist clinical response. She had wheezing on their exam and was given albuterol 125 mg of Solu-Medrol. O2 sats are initially 88% on their arrival. Patient currently is drowsy but awakens to voice light tactile stimuli. Plenty feeling tired. Denies any acute pain. Denies any ingestion aside from her prescribed medications. No fevers or chills. Has been coughing somewhat.  Past Medical History  Diagnosis Date  . GERD (gastroesophageal reflux disease)   . Quadriplegia (HCC)   . Hypertension   . Pressure ulcer of foot   . Chronic pain disorder   . Hyperlipidemia   . UTI (urinary tract infection)     reoccuring  . Occasional tremors   . CHF (congestive heart failure) [place])    Past Surgical History  Procedure Laterality Date  . Back surgery  08/2013  . Abdominal hysterectomy     Family History  Problem Relation Age of Onset  . Adopted: Yes   Social History  Substance Use Topics  . Smoking status: Former Smoker -- 0.25 packs/day    Types: Cigarettes  . Smokeless tobacco: Never Used  . Alcohol Use: No   OB History    No data available     Review of Systems  Level 5 caveat because pt is encephalopathic.   Allergies  Decadrol and Sodium phosphate  Home Medications   Prior to Admission medications   Medication Sig Start Date End Date Taking?  Authorizing Provider  aspirin EC 81 MG tablet Take 81 mg by mouth every morning.    Yes Historical Provider, MD  baclofen (LIORESAL) 10 MG tablet Take 10 mg by mouth 3 (three) times daily.   Yes Historical Provider, MD  calcium-vitamin D (OSCAL WITH D) 500-200 MG-UNIT per tablet Take 1 tablet by mouth daily with breakfast.   Yes Historical Provider, MD  ezetimibe (ZETIA) 10 MG tablet Take 10 mg by mouth every morning.    Yes Historical Provider, MD  gabapentin (NEURONTIN) 300 MG capsule Take 300 mg by mouth 3 (three) times daily.   Yes Historical Provider, MD  glimepiride (AMARYL) 2 MG tablet Take 1.5 tablets (3 mg total) by mouth daily with breakfast. 04/27/15  Yes Vassie Loll, MD  hydrALAZINE (APRESOLINE) 25 MG tablet Take 1 tablet (25 mg total) by mouth 2 (two) times daily. 04/27/15  Yes Vassie Loll, MD  LORazepam (ATIVAN) 1 MG tablet Take 0.5 tablets (0.5 mg total) by mouth every 12 (twelve) hours as needed for anxiety. Patient taking differently: Take 1 mg by mouth 4 (four) times daily.  04/18/14  Yes Meredeth Ide, MD  magnesium oxide (MAG-OX) 400 MG tablet Take 400 mg by mouth 2 (two) times daily.   Yes Historical Provider, MD  Multiple Vitamins-Minerals ([employer] SPECTRAVITE ADULT 50+ PO) Take 1 tablet by mouth daily.   Yes Historical Provider, MD  omeprazole (PRILOSEC) 20 MG capsule Take 2 capsules (40 mg total) by mouth  daily. Patient taking differently: Take 20 mg by mouth daily.  04/27/15  Yes Vassie Lollarlos Madera, MD  oxymorphone (OPANA ER) 20 MG 12 hr tablet Take 20-40 mg by mouth as directed. Take 40 mg Twice daily and may take 1 extra daily if needed for pain.   Yes Historical Provider, MD  potassium chloride SA (K-DUR,KLOR-CON) 20 MEQ tablet Take 1 tablet (20 mEq total) by mouth 2 (two) times daily. Patient taking differently: Take 20 mEq by mouth 3 (three) times daily.  04/18/14  Yes Meredeth IdeGagan S Lama, MD  predniSONE (DELTASONE) 5 MG tablet Take 15 mg by mouth daily with breakfast.   Yes  Historical Provider, MD  propranolol (INDERAL) 20 MG tablet Take 20 mg by mouth 3 (three) times daily. 04/14/15  Yes Historical Provider, MD  rOPINIRole (REQUIP) 0.5 MG tablet Take 0.5 mg by mouth 2 (two) times daily. Give 1 tablet 1-3 hours before bedtime (around 1900) and than 1 tablet at bedtime (around 2200)   Yes Historical Provider, MD  saccharomyces boulardii (FLORASTOR) 250 MG capsule Take 1 capsule (250 mg total) by mouth 2 (two) times daily. 04/27/15  Yes Vassie Lollarlos Madera, MD  sulfamethoxazole-trimethoprim (BACTRIM DS,SEPTRA DS) 800-160 MG tablet Take 1 tablet by mouth every 12 (twelve) hours. 04/27/15  Yes Vassie Lollarlos Madera, MD  tamsulosin (FLOMAX) 0.4 MG CAPS capsule Take 0.4 mg by mouth at bedtime.    Yes Historical Provider, MD  temazepam (RESTORIL) 15 MG capsule Take 15 mg by mouth at bedtime.   Yes Historical Provider, MD  torsemide (DEMADEX) 20 MG tablet Take 1 tablet (20 mg total) by mouth daily. Patient taking differently: Take 40 mg by mouth 2 (two) times daily.  04/20/15  Yes Jeralyn BennettEzequiel Zamora, MD  traZODone (DESYREL) 50 MG tablet Take 50 mg by mouth at bedtime. 04/14/15  Yes Historical Provider, MD  trifluoperazine (STELAZINE) 5 MG tablet Take 5 mg by mouth 2 (two) times daily as needed (for itching).   Yes Historical Provider, MD  nitrofurantoin, macrocrystal-monohydrate, (MACROBID) 100 MG capsule 1 capsule 2 (two) times daily. 5 day course, completed on 04/25/15 04/20/15   Historical Provider, MD  oxyCODONE (OXY IR/ROXICODONE) 5 MG immediate release tablet Take 1 tablet (5 mg total) by mouth every 8 (eight) hours as needed for severe pain. Patient not taking: Reported on 04/30/2015 04/20/15   Jeralyn BennettEzequiel Zamora, MD   BP 91/41 mmHg  Pulse 85  Temp(Src) 98.9 F (37.2 C) (Oral)  Resp 15  SpO2 94% Physical Exam  Constitutional:  Laying in bed with eyes closed.   HENT:  Head: Normocephalic and atraumatic.  Eyes: Conjunctivae are normal. Right eye exhibits no discharge. Left eye  exhibits no discharge.  Pupils constricted  Neck: Neck supple.  Cardiovascular: Normal rate, regular rhythm and normal heart sounds.  Exam reveals no gallop and no friction rub.   No murmur heard. Pulmonary/Chest: Effort normal and breath sounds normal. No respiratory distress.  Abdominal: Soft. She exhibits no distension. There is no tenderness.  Musculoskeletal: She exhibits no edema or tenderness.  Neurological:  Very drowsy. Opens eyes to loud voice and painful stimuli. Speech not understandable beyond occasional yes/no answers. Doesn't stay awake long enough to follow commands.   Skin: Skin is warm and dry.  Psychiatric: She has a normal mood and affect. Her behavior is normal. Thought content normal.  Nursing note and vitals reviewed.   ED Course  Procedures (including critical care time) Labs Review Labs Reviewed  CBC WITH DIFFERENTIAL/PLATELET - Abnormal; Notable for the  following:    WBC 14.4 (*)    Neutro Abs 12.4 (*)    All other components within normal limits  BASIC METABOLIC PANEL - Abnormal; Notable for the following:    Chloride 93 (*)    CO2 34 (*)    Glucose, Bld 217 (*)    All other components within normal limits  URINALYSIS, ROUTINE W REFLEX MICROSCOPIC (NOT AT [facility]) - Abnormal; Notable for the following:    Leukocytes, UA MODERATE (*)    All other components within normal limits  URINE RAPID DRUG SCREEN, HOSP PERFORMED - Abnormal; Notable for the following:    Opiates POSITIVE (*)    Benzodiazepines POSITIVE (*)    All other components within normal limits  URINE MICROSCOPIC-ADD ON - Abnormal; Notable for the following:    Casts HYALINE CASTS (*)    All other components within normal limits  CULTURE, BLOOD (ROUTINE X 2)  CULTURE, BLOOD (ROUTINE X 2)  BRAIN NATRIURETIC PEPTIDE  TROPONIN I  LACTIC ACID, PLASMA  LACTIC ACID, PLASMA  BLOOD GAS, ARTERIAL    Imaging Review Dg Chest 2 View  04/30/2015  CLINICAL DATA:  Decreased responsiveness with  wheezing and productive cough over the past few days. EXAM: CHEST  2 VIEW COMPARISON:  PA and lateral chest 04/23/2015.  CT chest 04/17/2015. FINDINGS: The lungs are clear. Heart size is normal. No pneumothorax or pleural effusion. No focal bony abnormality. IMPRESSION: No acute disease. Electronically Signed   By: Drusilla Kanner M.D.   On: 04/30/2015 11:03   I have personally reviewed and evaluated these images and lab results as part of my medical decision-making.   EKG Interpretation   Date/Time:  Monday April 30 2015 09:52:05 EDT Ventricular Rate:  82 PR Interval:  158 QRS Duration: 70 QT Interval:  476 QTC Calculation: 556 R Axis:   29 Text Interpretation:  Sinus rhythm Probable left atrial enlargement  Borderline repolarization abnormality Prolonged QT interval ED PHYSICIAN  INTERPRETATION AVAILABLE IN [facility] Confirmed by TEST, Record  ([id number]) on 05/01/2015 7:55:03 AM      MDM   Final diagnoses:  Mental status, decreased    69yF with decreased mental status. Initial impression was medication overdose. She apparently did seem to respond to Narcan for EMS. No discernable clinical response in ED. On multiple sedating meds. Continues to remain very drowsy despite being in ED 7 hours at this point. Developped hypotension. IVF. Consider infectious etiology. Will send urine/blood cultures. Initially ordered abx, but discontinued at request of admitting physician. Remains very drowsy. Will admit for observation.     Raeford Razor, MD 05/02/15 [phone]

## 2015-05-01 DIAGNOSIS — R400 Somnolence: Secondary | ICD-10-CM

## 2015-05-01 DIAGNOSIS — G894 Chronic pain syndrome: Secondary | ICD-10-CM | POA: Diagnosis not present

## 2015-05-01 DIAGNOSIS — I5032 Chronic diastolic (congestive) heart failure: Secondary | ICD-10-CM | POA: Diagnosis not present

## 2015-05-01 DIAGNOSIS — J690 Pneumonitis due to inhalation of food and vomit: Secondary | ICD-10-CM | POA: Diagnosis not present

## 2015-05-01 LAB — BASIC METABOLIC PANEL
ANION GAP: 10 (ref 5–15)
BUN: 13 mg/dL (ref 6–20)
CALCIUM: 8.7 mg/dL — AB (ref 8.9–10.3)
CHLORIDE: 93 mmol/L — AB (ref 101–111)
CO2: 34 mmol/L — AB (ref 22–32)
Creatinine, Ser: 0.75 mg/dL (ref 0.44–1.00)
GFR calc non Af Amer: >60 mL/min (ref >60)
Glucose, Bld: 118 mg/dL — ABNORMAL HIGH (ref 65–99)
POTASSIUM: 3.9 mmol/L (ref 3.5–5.1)
Sodium: 137 mmol/L (ref 135–145)

## 2015-05-01 LAB — CBC
HCT: 38 % (ref 36.0–46.0)
HEMOGLOBIN: 12.1 g/dL (ref 12.0–15.0)
MCH: 28.6 pg (ref 26.0–34.0)
MCHC: 31.8 g/dL (ref 30.0–36.0)
MCV: 89.8 fL (ref 78.0–100.0)
Platelets: 290 10*3/uL (ref 150–400)
RBC: 4.23 MIL/uL (ref 3.87–5.11)
RDW: 15.1 % (ref 11.5–15.5)
WBC: 13.6 10*3/uL — ABNORMAL HIGH (ref 4.0–10.5)

## 2015-05-01 LAB — GLUCOSE, CAPILLARY
Glucose-Capillary: 92 mg/dL (ref 65–99)
Glucose-Capillary: 185 mg/dL — ABNORMAL HIGH (ref 65–99)
Glucose-Capillary: 168 mg/dL — ABNORMAL HIGH (ref 65–99)
Glucose-Capillary: 152 mg/dL — ABNORMAL HIGH (ref 65–99)

## 2015-05-01 LAB — LACTIC ACID, PLASMA
LACTIC ACID, VENOUS: 4.7 mmol/L — AB (ref 0.5–2.0)
Lactic Acid, Venous: 3.5 mmol/L (ref 0.5–2.0)
Lactic Acid, Venous: 2.5 mmol/L (ref 0.5–2.0)

## 2015-05-01 NOTE — Progress Notes (Signed)
Came to insert PICC.   Explained the procedure benefits and risks to her and husband at bedside.   She states,  "I'm terrified of this"   I gave reassurance however she wants to know if she can just take her medication by pill.  They have several questions for the MD so I encouraged they talk with MD first.   She stated,   "I really don't want to do this".  Primary RN made aware and will notify MD.

## 2015-05-01 NOTE — Progress Notes (Signed)
Initial Nutrition Assessment  DOCUMENTATION CODES:   Obesity unspecified  INTERVENTION:  - Continue Heart Healthy/Carb Modified diet - RD will continue to monitor for needs  NUTRITION DIAGNOSIS:   Altered nutrition lab value related to chronic illness as evidenced by other (see comment) (CBGs: 92-261 mg/dL.)  GOAL:   Patient will meet greater than or equal to 90% of their needs  MONITOR:   PO intake, Weight trends, Labs, Skin, I & O's  REASON FOR ASSESSMENT:   Malnutrition Screening Tool  ASSESSMENT:   69 y.o. 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 Who came to the emergency department secondary to being found unresponsive by caregiver. According to history collected by EMS and emergency department physician, patient around lunch time and was complaining of pain for what she received more pain medication after already getting her morning dose of Opana; patient finished eating and went to bed. When the caregiver went to check on her multiple hours later found the patient to be unresponsive and with severe apnea (breathing only 6 times per minute). EMS was called and on arrival found the patient to be mildly hypoxic; patient received Narcan with excellent response. Per EMS she was also having significant rhonchi and mild wheezing for which she received 125 mg of Solu-Medrol and was started on albuterol nebulization. In day the patient blood pressure was on the soft side mild elevation of her WBCs of 14,000 range and even more alert still a little bit confused. [facility] has been called to admit the patient for further evaluation and treatment. Chest x-ray demonstrated no acute abnormalities. Lactic acid was 4.8.  Pt seen for MST. BMI indicates obesity. Pt reports she ate breakfast  potatoes, 2 pudding cups, and 2 cups of orange juice for breakfast this AM; she did not like the egg whites. She had not ordered lunch at time of RD visit. Pt reports she was started on Prednisone about 1 year ago and that appetite has increased due to this and weight has trended up about 50 lbs in this time frame. She states that she was recently discharged from the hospital and that during that admission MD had encouraged her to monitor portion sizes. She states that since d/c she had been monitoring portion sizes as well as sodium and carbohydrate intake.  Pt states hx of CHF and need to limit sodium due to this and states MD in the hospital had previously told her she is diabetic but that PCP states she is not; HgbA1c: 9.8%.  Son who was at bedside at beginning of RD visit states he bought a book to help with low-Na diet but requests additional handouts related to this, carbohydrates, and high cholesterol reduction. Provided pt with handouts from the [employer] outlining low sodium foods, the sodium content of foods, CHF nutrition therapy, hypercholesterolemia nutrition therapy, and low-carbohydrate snack options. Briefly went over these with pt. Son had left prior to RD being able to print handouts and pt reports son is the one who goes grocery shopping and cooks at home. She states she has used Mrs[employer] in the past; encouraged the use of seasonings such as this to further limit the addition of salt to foods.  Expect good to fair compliance.  Pt likely meeting needs PTA. No muscle or fat wasting present; mild edema to BLE. Medications  reviewed. Labs reviewed; CBGs: 92-261 mg/dL, Cl: 93 mmol/L, Ca: 8.7 mg/dL.   Diet Order:  Diet heart healthy/carb modified Room service appropriate?: Yes; Fluid consistency:: Thin  Skin:   WDL  Last BM:  10/24  Height:   Ht Readings from Last 1 Encounters:  04/30/15 5\' 2"  (1.575 m)    Weight:   Wt Readings from Last 1 Encounters:   04/23/15 194 lb 8 oz (88.225 kg)    Ideal Body Weight:  50 kg (kg)  BMI:  35.57 kg/m2  Estimated Nutritional Needs:   Kcal:  1590-1790  Protein:  65-75 grams  Fluid:  1.5-1.7 L/day  EDUCATION NEEDS:   Education needs addressed     Trenton GammonJessica Gerik Coberly, RD, LDN Inpatient Clinical Dietitian Pager # [phone] After hours/weekend pager # [phone]

## 2015-05-01 NOTE — Progress Notes (Signed)
Patient ID: [name] [name], female   DOB: 1945-08-29, 69 y.o.   MRN: [id number]  [facility] PROGRESS NOTE  [name] [name] KPQ:[id number] DOB: 05-10-1946 DOA: 04/30/2015 PCP: Paris Lore, MD   Brief narrative:    69 y.o. 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 Who came to the emergency department secondary to being found unresponsive by caregiver. EMS was called and on arrival found the patient to be mildly hypoxic; patient received Narcan with excellent response. Per EMS she was also having significant rhonchi and mild wheezing for which she received 125 mg of Solu-Medrol and was started on albuterol neb.   Assessment/Plan:    Principal Problem:   Acute encephalopathy - possibly secondary to narcotic OD - responded to narcan in ED - mental status is clear this AM  - avoid oversedation with narcotics   Active Problems:   Acute respiratory failure with hypoxia and hypercarbia - with ? underlying aspiration pneumonitis in the setting of narcotic OD - respiratory status stable this AM - keep on O2 via [place]     Sepsis secondary to ?UTI/pneumonitis  - pt met criteria for sepsis on admission with WBC > 12, RR > 22, elevated lactic acid and SBP in 90's - continue Zosyn day #2 - pt was recently treated with Bactrim for UTI and has not completed treatment  - also due to concern of aspiration, zosyn should be adequate for now - CBC in AM    Paraplegia following spinal cord injury ([place]) - frequent turning     Diabetes ([place]) - continue SSI     Chronic pain disorder - avoid oversedation     Chronic diastolic (congestive) heart failure (HCC) - no signs of volume overload on exam this AM - daily weights, strict I/O    Essential HTN - continue to hold antihypertensive regimen for now as SBP still on low end of  normal   DVT prophylaxis - Heparin SQ  Code Status: Full.  Family Communication:  plan of care discussed with the patient Disposition Plan: Home when stable.   IV access:  Peripheral IV  Procedures and diagnostic studies:    Dg Chest 2 View 04/30/2015  No acute disease.   Medical Consultants:  None  Other Consultants:  None  IAnti-Infectives:   Zosyn 10/24 -->  Faye Ramsay, MD  [facility] Pager [phone]  If 7PM-7AM, please contact night-coverage [email or web address] Password [facility] 05/01/2015, 12:37 PM     HPI/Subjective: No events overnight.   Objective: Filed Vitals:   04/30/15 1929 04/30/15 2024 04/30/15 2059 05/01/15 0425  BP: 112/53 131/98  116/59  Pulse: 74 77  59  Temp:  98.1 F (36.7 C)  98.6 F (37 C)  TempSrc:  Oral  Oral  Resp: '15 22  20  ' Height:   '5\' 2"'  (1.575 m)   SpO2: 99% 100%  100%    Intake/Output Summary (Last 24 hours) at 05/01/15 1237 Last data filed at 05/01/15 0945  Gross per 24 hour  Intake    720 ml  Output      0 ml  Net    720 ml    Exam:   General:  Pt is alert, follows commands appropriately, not in acute distress  Cardiovascular: Regular rate and rhythm, S1/S2, no murmurs, no rubs, no gallops  Respiratory: Clear to auscultation bilaterally, no wheezing, diminished breath sounds at bases  Abdomen: Soft, non tender, non distended, bowel sounds present, no guarding   Data Reviewed: Basic Metabolic Panel:  Recent Labs Lab 04/25/15 0525 04/26/15 0601 04/27/15 0540 04/30/15 1120 05/01/15 0405  NA 137 141 139 138 137  K 3.5 3.6 3.5 4.0 3.9  CL 98* 105 108 93* 93*  CO2 '30 27 22 ' 34* 34*  GLUCOSE 169* 180* 135* 217* 118*  BUN 9 7 <5* 14 13  CREATININE 0.44 0.44 0.39* 0.85 0.75  CALCIUM 8.6* 8.8* 8.6* 9.0 8.7*   CBC:  Recent Labs Lab 04/25/15 0525 04/30/15 1120 05/01/15 0405  WBC 9.8 14.4* 13.6*  NEUTROABS  --  12.4*  --   HGB 12.8 13.3 12.1  HCT 40.9 42.6 38.0  MCV 88.7 90.1 89.8  PLT 236 281 290    Cardiac Enzymes:  Recent Labs Lab 04/30/15 1120  TROPONINI <0.03   CBG:  Recent Labs Lab 04/27/15 0801 04/27/15 1209 04/30/15 2027 05/01/15 0744 05/01/15 1211  GLUCAP 134* 210* 219* 92 185*    Recent Results (from the past 240 hour(s))  Urine C&S     Status: None   Collection Time: 04/23/15  7:40 PM  Result Value Ref Range Status   Specimen Description URINE, CATHETERIZED  Final   Special Requests NONE  Final   Culture   Final    NO GROWTH 1 DAY Performed at [facility]    Report Status 04/24/2015 FINAL  Final  Blood culture (routine x 2)     Status: None   Collection Time: 04/23/15  7:40 PM  Result Value Ref Range Status   Specimen Description BLOOD RIGHT THUMB  Final   Special Requests BOTTLES DRAWN AEROBIC AND ANAEROBIC 5 ML  Final   Culture   Final    NO GROWTH 5 DAYS Performed at [facility]    Report Status 04/28/2015 FINAL  Final  Blood culture (routine x 2)     Status: None   Collection Time: 04/23/15  7:48 PM  Result Value Ref Range Status   Specimen Description BLOOD LEFT THUMB  Final   Special Requests BOTTLES DRAWN AEROBIC AND ANAEROBIC 4 CC  Final   Culture   Final    NO GROWTH 5 DAYS Performed at [facility]    Report Status 04/28/2015 FINAL  Final  MRSA PCR Screening     Status: None   Collection Time: 04/24/15 12:05 AM  Result Value Ref Range Status   MRSA by PCR NEGATIVE NEGATIVE Final    Comment:        The GeneXpert MRSA Assay (FDA approved for NASAL specimens only), is one component of a comprehensive MRSA colonization surveillance program. It is not intended to diagnose MRSA infection nor to guide or monitor treatment for MRSA infections.   C difficile quick scan w PCR reflex     Status: Abnormal   Collection Time: 04/25/15  9:37 AM  Result Value Ref Range Status   C Diff antigen POSITIVE (A) NEGATIVE Final   C Diff toxin NEGATIVE NEGATIVE Final   C Diff interpretation   Final    C. difficile  present, but toxin not detected. This indicates colonization. In most cases, this does not require treatment. If patient has signs and symptoms consistent with colitis, consider treatment. Requires ENTERIC precautions.  Urine culture     Status: None (Preliminary result)   Collection Time: 04/30/15  3:30 PM  Result Value Ref Range Status   Specimen Description URINE, CATHETERIZED  Final  Special Requests NONE  Final   Culture   Final    NO GROWTH < 24 HOURS Performed at [facility]    Report Status PENDING  Incomplete     Scheduled Meds: . aspirin EC  81 mg Oral q morning - 10a  . baclofen  10 mg Oral BID  . calcium-vitamin D  1 tablet Oral Q breakfast  . ezetimibe  10 mg Oral q morning - 10a  . gabapentin  300 mg Oral TID  . glimepiride  3 mg Oral Q breakfast  . heparin  5,000 Units Subcutaneous 3 times per day  . insulin aspart  0-15 Units Subcutaneous TID WC  . insulin aspart  0-5 Units Subcutaneous QHS  . magnesium oxide  400 mg Oral BID  . oxymorphone  20 mg Oral Q12H  . pantoprazole  40 mg Oral BID  . piperacillin-tazobactam (ZOSYN)  IV  3.375 g Intravenous Q8H  . predniSONE  15 mg Oral Q breakfast  . propranolol  20 mg Oral BID  . rOPINIRole  0.5 mg Oral 2 times per day  . saccharomyces boulardii  250 mg Oral BID  . tamsulosin  0.4 mg Oral QHS  . traZODone  50 mg Oral QHS   Continuous Infusions: . sodium chloride 100 mL/hr at 05/01/15 0426

## 2015-05-02 DIAGNOSIS — G894 Chronic pain syndrome: Secondary | ICD-10-CM | POA: Diagnosis not present

## 2015-05-02 DIAGNOSIS — J690 Pneumonitis due to inhalation of food and vomit: Secondary | ICD-10-CM

## 2015-05-02 DIAGNOSIS — I5032 Chronic diastolic (congestive) heart failure: Secondary | ICD-10-CM | POA: Diagnosis not present

## 2015-05-02 LAB — CBC
HEMATOCRIT: 39.2 % (ref 36.0–46.0)
Hemoglobin: 12.2 g/dL (ref 12.0–15.0)
MCH: 28.4 pg (ref 26.0–34.0)
MCHC: 31.1 g/dL (ref 30.0–36.0)
MCV: 91.2 fL (ref 78.0–100.0)
Platelets: 253 10*3/uL (ref 150–400)
RBC: 4.3 MIL/uL (ref 3.87–5.11)
RDW: 15.3 % (ref 11.5–15.5)
WBC: 10.5 10*3/uL (ref 4.0–10.5)

## 2015-05-02 LAB — URINE CULTURE: Culture: NO GROWTH

## 2015-05-02 LAB — BASIC METABOLIC PANEL
Anion gap: 8 (ref 5–15)
BUN: 14 mg/dL (ref 6–20)
CHLORIDE: 93 mmol/L — AB (ref 101–111)
CO2: 37 mmol/L — AB (ref 22–32)
CREATININE: 0.69 mg/dL (ref 0.44–1.00)
Calcium: 9 mg/dL (ref 8.9–10.3)
GFR calc Af Amer: >60 mL/min (ref >60)
GFR calc non Af Amer: >60 mL/min (ref >60)
Glucose, Bld: 88 mg/dL (ref 65–99)
Potassium: 4.4 mmol/L (ref 3.5–5.1)
Sodium: 138 mmol/L (ref 135–145)

## 2015-05-02 LAB — LACTIC ACID, PLASMA: Lactic Acid, Venous: 4.1 mmol/L (ref 0.5–2.0)

## 2015-05-02 LAB — GLUCOSE, CAPILLARY
GLUCOSE-CAPILLARY: 188 mg/dL — AB (ref 65–99)
GLUCOSE-CAPILLARY: 151 mg/dL — AB (ref 65–99)
Glucose-Capillary: 76 mg/dL (ref 65–99)
Glucose-Capillary: 264 mg/dL — ABNORMAL HIGH (ref 65–99)

## 2015-05-02 MED ORDER — PIPERACILLIN-TAZOBACTAM 3.375 G IVPB 30 MIN
3.3750 g | INTRAVENOUS | Status: AC
  Administered 2015-05-02: 3.375 g via INTRAVENOUS
  Filled 2015-05-02: qty 50

## 2015-05-02 MED ORDER — SULFAMETHOXAZOLE-TRIMETHOPRIM 400-80 MG PO TABS
1.0000 | ORAL_TABLET | Freq: Two times a day (BID) | ORAL | Status: DC
  Administered 2015-05-02: 1 via ORAL
  Filled 2015-05-02 (×2): qty 1

## 2015-05-02 MED ORDER — PIPERACILLIN-TAZOBACTAM 3.375 G IVPB
3.3750 g | Freq: Three times a day (TID) | INTRAVENOUS | Status: DC
  Administered 2015-05-03 (×2): 3.375 g via INTRAVENOUS
  Filled 2015-05-02 (×3): qty 50

## 2015-05-02 MED ORDER — SULFAMETHOXAZOLE-TRIMETHOPRIM 400-80 MG PO TABS
1.0000 | ORAL_TABLET | Freq: Once | ORAL | Status: AC
  Administered 2015-05-02: 1 via ORAL
  Filled 2015-05-02: qty 1

## 2015-05-02 NOTE — Progress Notes (Signed)
Pt is a difficult stick, RN unable to place line, I.V team notified for assistance. Zosyn on hold, Pt has no line, awaiting i.v. Team for restart. MD aware at this time.

## 2015-05-02 NOTE — Progress Notes (Signed)
ANTIBIOTIC CONSULT NOTE - INITIAL  Pharmacy Consult for Zosyn Indication: Sepsis  Allergies  Allergen Reactions  . Decadrol [Dexamethasone] Swelling    Swelling of face and stomach   . Sodium Phosphate Swelling    Swelling of face and stomach     Patient Measurements: Height: 5\' 2"  (157.5 cm) Weight: 201 lb 11.2 oz (91.491 kg) IBW/kg (Calculated) : 50.1 Adjusted Body Weight:   Vital Signs: Temp: 98.4 F (36.9 C) (10/26 0510) Temp Source: Oral (10/26 0510) BP: 139/62 mmHg (10/26 0510) Pulse Rate: 72 (10/26 0510) Intake/Output from previous day: 10/25 0701 - 10/26 0700 In: 480 [P.O.:480] Out: -  Intake/Output from this shift: Total I/O In: 360 [P.O.:360] Out: -   Labs:  Recent Labs  04/30/15 1120 05/01/15 0405 05/02/15 0545  WBC 14.4* 13.6* 10.5  HGB 13.3 12.1 12.2  PLT 281 290 253  CREATININE 0.85 0.75 0.69   Estimated Creatinine Clearance: 68.9 mL/min (by C-G formula based on Cr of 0.69). No results for input(s): VANCOTROUGH, VANCOPEAK, VANCORANDOM, GENTTROUGH, GENTPEAK, GENTRANDOM, TOBRATROUGH, TOBRAPEAK, TOBRARND, AMIKACINPEAK, AMIKACINTROU, AMIKACIN in the last 72 hours.   Microbiology: Recent Results (from the past 720 hour(s))  Urine culture     Status: None   Collection Time: 04/17/15  1:18 PM  Result Value Ref Range Status   Specimen Description URINE, CATHETERIZED  Final   Special Requests NONE  Final   Culture   Final    >=100,000 COLONIES/mL ESCHERICHIA COLI Confirmed Extended Spectrum Beta-Lactamase Producer (ESBL) Performed at [facility]    Report Status 04/19/2015 FINAL  Final   Organism ID, Bacteria ESCHERICHIA COLI  Final      Susceptibility   Escherichia coli - MIC*    AMPICILLIN >=32 RESISTANT Resistant     CEFAZOLIN >=64 RESISTANT Resistant     CEFTRIAXONE >=64 RESISTANT Resistant     CIPROFLOXACIN >=4 RESISTANT Resistant     GENTAMICIN <=1 SENSITIVE Sensitive     IMIPENEM <=0.25 SENSITIVE Sensitive     NITROFURANTOIN  <=16 SENSITIVE Sensitive     TRIMETH/SULFA <=20 SENSITIVE Sensitive     AMPICILLIN/SULBACTAM 16 INTERMEDIATE Intermediate     PIP/TAZO 8 SENSITIVE Sensitive     * >=100,000 COLONIES/mL ESCHERICHIA COLI  Urine C&S     Status: None   Collection Time: 04/23/15  7:40 PM  Result Value Ref Range Status   Specimen Description URINE, CATHETERIZED  Final   Special Requests NONE  Final   Culture   Final    NO GROWTH 1 DAY Performed [facility]    Report Status 04/24/2015 FINAL  Final  Blood culture (routine x 2)     Status: None   Collection Time: 04/23/15  7:40 PM  Result Value Ref Range Status   Specimen Description BLOOD RIGHT THUMB  Final   Special Requests BOTTLES DRAWN AEROBIC AND ANAEROBIC 5 ML  Final   Culture   Final    NO GROWTH 5 DAYS Performed at [facility]    Report Status 04/28/2015 FINAL  Final  Blood culture (routine x 2)     Status: None   Collection Time: 04/23/15  7:48 PM  Result Value Ref Range Status   Specimen Description BLOOD LEFT THUMB  Final   Special Requests BOTTLES DRAWN AEROBIC AND ANAEROBIC 4 CC  Final   Culture   Final    NO GROWTH 5 DAYS Performed at [facility]    Report Status 04/28/2015 FINAL  Final  MRSA PCR Screening  Status: None   Collection Time: 04/24/15 12:05 AM  Result Value Ref Range Status   MRSA by PCR NEGATIVE NEGATIVE Final    Comment:        The GeneXpert MRSA Assay (FDA approved for NASAL specimens only), is one component of a comprehensive MRSA colonization surveillance program. It is not intended to diagnose MRSA infection nor to guide or monitor treatment for MRSA infections.   C difficile quick scan w PCR reflex     Status: Abnormal   Collection Time: 04/25/15  9:37 AM  Result Value Ref Range Status   C Diff antigen POSITIVE (A) NEGATIVE Final   C Diff toxin NEGATIVE NEGATIVE Final   C Diff interpretation   Final    C. difficile present, but toxin not detected. This indicates  colonization. In most cases, this does not require treatment. If patient has signs and symptoms consistent with colitis, consider treatment. Requires ENTERIC precautions.  Urine culture     Status: None   Collection Time: 04/30/15  3:30 PM  Result Value Ref Range Status   Specimen Description URINE, CATHETERIZED  Final   Special Requests NONE  Final   Culture   Final    NO GROWTH 2 DAYS Performed at [facility]    Report Status 05/02/2015 FINAL  Final  Blood culture (routine x 2)     Status: None (Preliminary result)   Collection Time: 04/30/15  5:11 PM  Result Value Ref Range Status   Specimen Description BLOOD LEFT WRIST  Final   Special Requests BOTTLES DRAWN AEROBIC AND ANAEROBIC  Final   Culture   Final    NO GROWTH 2 DAYS Performed at [facility]    Report Status PENDING  Incomplete  Blood culture (routine x 2)     Status: None (Preliminary result)   Collection Time: 04/30/15  8:50 PM  Result Value Ref Range Status   Specimen Description BLOOD LEFT HAND  Final   Special Requests IN PEDIATRIC BOTTLE  1CC  Final   Culture   Final    NO GROWTH 2 DAYS Performed at [facility]    Report Status PENDING  Incomplete    Medical History: Past Medical History  Diagnosis Date  . GERD (gastroesophageal reflux disease)   . Quadriplegia (HCC)   . Hypertension   . Pressure ulcer of foot   . Chronic pain disorder   . Hyperlipidemia   . UTI (urinary tract infection)     reoccuring  . Occasional tremors   . CHF (congestive heart failure) (HCC)     Assessment: 40 yoF with known paraplegia and recent admission for ESBL UTI admitted 10/24 after found unresponsive by caregiver, started on zosyn for concern for potential aspiration which was discontinued after 2 doses. CXR 10/24 negative for acute disease. MD resumed zosyn today per pharmacy consult for sepsis.    Anti-infectives 10/24 >> Zosyn  >> 10/25, 10/26 >> 10/26 >> Bactrim >>     Vitals/Labs WBC: Improved to WNL Tm24h:Afebrile Renal: SCr stable, CrCl ~69 (likely overestimated with paraplegia)  Cultures 10/24 bloodx2: NGTD 10/24 urine: NGF   Goal of Therapy:  Eradication of infection  Plan:  Resume Zosyn 3.375g IV q8h (infuse over 4 hours)  Haynes Hoehn, PharmD, BCPS 05/02/2015, 3:49 PM  Pager: [phone]

## 2015-05-02 NOTE — Progress Notes (Signed)
Patient ID: [name] [name], female   DOB: April 27, 1946, 69 y.o.   MRN: [id number]  [facility] PROGRESS NOTE  [name] [name] IFO:[id number] DOB: 07-16-1945 DOA: 04/30/2015 PCP: Paris Lore, MD   Brief narrative:    69 y.o. 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 Who came to the emergency department secondary to being found unresponsive by caregiver. EMS was called and on arrival found the patient to be mildly hypoxic; patient received Narcan with excellent response. Per EMS she was also having significant rhonchi and mild wheezing for which she received 125 mg of Solu-Medrol and was started on albuterol neb.   Assessment/Plan:    Principal Problem:   Acute encephalopathy - possibly secondary to narcotic OD - responded to narcan in ED - mental status is clear this AM  - avoid oversedation with narcotics  -tolerating opana.     Acute respiratory failure with hypoxia and hypercarbia - with ? underlying aspiration pneumonitis in the setting of narcotic OD - keep on O2 via [place]     Sepsis secondary to ?UTI/pneumonitis  - pt met criteria for sepsis on admission with WBC > 12, RR > 22, elevated lactic acid and SBP in 90's - resume Zosyn day #2 , lactic acid elevated.  - pt was recently treated with Bactrim for UTI and has not completed treatment  - also due to concern of aspiration, zosyn should be adequate for now - CBC in AM    Paraplegia following spinal cord injury ([place]) - frequent turning     Diabetes ([place]) - continue SSI     Chronic pain disorder - avoid oversedation     Chronic diastolic (congestive) heart failure (HCC) - no signs of volume overload on exam this AM - daily weights, strict I/O    Essential HTN - continue to hold antihypertensive regimen for now as SBP still on low end of normal    DVT prophylaxis - Heparin SQ  Code Status: Full.  Family Communication:  plan of care discussed with the patient Disposition Plan: Home when stable.   IV access:  Peripheral IV  Procedures and diagnostic studies:    Dg Chest 2 View 04/30/2015  No acute disease.   Medical Consultants:  None  Other Consultants:  None  IAnti-Infectives:   Zosyn 10/24 -->  [name] [name], Cassie Freer, MD  [facility] Pager [phone]  If 7PM-7AM, please contact night-coverage [email or web address] Password [email or web address] 05/02/2015, 12:21 PM     HPI/Subjective: Alert, answering questions. She is afraid of picc line. Explain that will check lactic acid, if still elevated she will needs IV fluids and IV antibiotics.   Objective: Filed Vitals:   05/01/15 0425 05/01/15 1707 05/01/15 2106 05/02/15 0510  BP: 116/59 149/62 157/74 139/62  Pulse: 59 64 73 72  Temp: 98.6 F (37 C) 98.9 F (37.2 C) 98.9 F (37.2 C) 98.4 F (36.9 C)  TempSrc: Oral Oral Oral Oral  Resp: '20 19 18 18  ' Height:      Weight:    91.491 kg (201 lb 11.2 oz)  SpO2: 100% 100% 100% 100%    Intake/Output Summary (Last 24 hours) at 05/02/15 1221 Last data filed at 05/02/15 0915  Gross per 24 hour  Intake    600 ml  Output      0 ml  Net    600 ml    Exam:   General:  Pt is alert, follows commands appropriately, not in acute distress  Cardiovascular: Regular rate and rhythm, S1/S2, no murmurs, no rubs, no gallops  Respiratory: Clear to auscultation bilaterally, no wheezing, diminished breath sounds at bases   Abdomen: Soft, non tender, non distended, bowel sounds present, no guarding   Data Reviewed: Basic Metabolic Panel:  Recent Labs Lab 04/26/15 0601 04/27/15 0540 04/30/15 1120 05/01/15 0405 05/02/15 0545  NA 141 139 138 137 138  K 3.6 3.5 4.0 3.9 4.4  CL 105 108 93* 93* 93*  CO2 27 22 34* 34* 37*  GLUCOSE 180* 135* 217* 118* 88  BUN 7 <5* '14 13 14  ' CREATININE 0.44 0.39* 0.85 0.75 0.69  CALCIUM 8.8* 8.6* 9.0 8.7* 9.0    CBC:  Recent Labs Lab 04/30/15 1120 05/01/15 0405 05/02/15 0545  WBC 14.4* 13.6* 10.5  NEUTROABS 12.4*  --   --   HGB 13.3 12.1 12.2  HCT 42.6 38.0 39.2  MCV 90.1 89.8 91.2  PLT 281 290 253   Cardiac Enzymes:  Recent Labs Lab 04/30/15 1120  TROPONINI <0.03   CBG:  Recent Labs Lab 05/01/15 1211 05/01/15 1705 05/01/15 2109 05/02/15 0808 05/02/15 1215  GLUCAP 185* 168* 152* 76 264*    Recent Results (from the past 240 hour(s))  Urine C&S     Status: None   Collection Time: 04/23/15  7:40 PM  Result Value Ref Range Status   Specimen Description URINE, CATHETERIZED  Final   Special Requests NONE  Final   Culture   Final    NO GROWTH 1 DAY Performed at [facility]    Report Status 04/24/2015 FINAL  Final  Blood culture (routine x 2)     Status: None   Collection Time: 04/23/15  7:40 PM  Result Value Ref Range Status   Specimen Description BLOOD RIGHT THUMB  Final   Special Requests BOTTLES DRAWN AEROBIC AND ANAEROBIC 5 ML  Final   Culture   Final    NO GROWTH 5 DAYS Performed at [facility]    Report Status 04/28/2015 FINAL  Final  Blood culture (routine x 2)     Status: None   Collection Time: 04/23/15  7:48 PM  Result Value Ref Range Status   Specimen Description BLOOD LEFT THUMB  Final   Special Requests BOTTLES DRAWN AEROBIC AND ANAEROBIC 4 CC  Final   Culture   Final    NO GROWTH 5 DAYS Performed at [facility]    Report Status 04/28/2015 FINAL  Final  MRSA PCR Screening     Status: None   Collection Time: 04/24/15 12:05 AM  Result Value Ref Range Status   MRSA by PCR NEGATIVE NEGATIVE Final    Comment:        The GeneXpert MRSA Assay (FDA approved for NASAL specimens only), is one component of a comprehensive MRSA colonization surveillance program. It is not intended to diagnose MRSA infection nor to guide or monitor treatment for MRSA infections.   C difficile quick scan w PCR reflex     Status: Abnormal    Collection Time: 04/25/15  9:37 AM  Result Value Ref Range Status   C Diff antigen POSITIVE (A) NEGATIVE Final   C Diff toxin NEGATIVE NEGATIVE Final   C Diff interpretation   Final    C. difficile present, but toxin not detected. This indicates colonization. In most cases, this does not require treatment. If patient has signs and symptoms consistent with colitis,  consider treatment. Requires ENTERIC precautions.  Urine culture     Status: None (Preliminary result)   Collection Time: 04/30/15  3:30 PM  Result Value Ref Range Status   Specimen Description URINE, CATHETERIZED  Final   Special Requests NONE  Final   Culture   Final    NO GROWTH < 24 HOURS Performed at [facility]    Report Status PENDING  Incomplete  Blood culture (routine x 2)     Status: None (Preliminary result)   Collection Time: 04/30/15  5:11 PM  Result Value Ref Range Status   Specimen Description BLOOD LEFT WRIST  Final   Special Requests BOTTLES DRAWN AEROBIC AND ANAEROBIC 5ML  Final   Culture   Final    NO GROWTH < 24 HOURS Performed at [facility]    Report Status PENDING  Incomplete  Blood culture (routine x 2)     Status: None (Preliminary result)   Collection Time: 04/30/15  8:50 PM  Result Value Ref Range Status   Specimen Description BLOOD LEFT HAND  Final   Special Requests IN PEDIATRIC BOTTLE  [place]  Final   Culture   Final    NO GROWTH < 24 HOURS Performed at [facility]    Report Status PENDING  Incomplete     Scheduled Meds: . aspirin EC  81 mg Oral q morning - 10a  . baclofen  10 mg Oral BID  . calcium-vitamin D  1 tablet Oral Q breakfast  . ezetimibe  10 mg Oral q morning - 10a  . gabapentin  300 mg Oral TID  . glimepiride  3 mg Oral Q breakfast  . heparin  5,000 Units Subcutaneous 3 times per day  . insulin aspart  0-15 Units Subcutaneous TID WC  . insulin aspart  0-5 Units Subcutaneous QHS  . magnesium oxide  400 mg Oral BID  . oxymorphone  20 mg Oral Q12H   . pantoprazole  40 mg Oral BID  . predniSONE  15 mg Oral Q breakfast  . propranolol  20 mg Oral BID  . rOPINIRole  0.5 mg Oral 2 times per day  . saccharomyces boulardii  250 mg Oral BID  . sulfamethoxazole-trimethoprim  1 tablet Oral Q12H  . tamsulosin  0.4 mg Oral QHS  . traZODone  50 mg Oral QHS   Continuous Infusions: . sodium chloride 100 mL/hr at 05/01/15 0426

## 2015-05-03 DIAGNOSIS — G894 Chronic pain syndrome: Secondary | ICD-10-CM

## 2015-05-03 DIAGNOSIS — I5032 Chronic diastolic (congestive) heart failure: Secondary | ICD-10-CM | POA: Diagnosis not present

## 2015-05-03 LAB — BASIC METABOLIC PANEL
ANION GAP: 9 (ref 5–15)
BUN: 13 mg/dL (ref 6–20)
CHLORIDE: 95 mmol/L — AB (ref 101–111)
CO2: 33 mmol/L — AB (ref 22–32)
Calcium: 8.8 mg/dL — ABNORMAL LOW (ref 8.9–10.3)
Creatinine, Ser: 0.63 mg/dL (ref 0.44–1.00)
GFR calc Af Amer: >60 mL/min (ref >60)
GLUCOSE: 101 mg/dL — AB (ref 65–99)
POTASSIUM: 3.8 mmol/L (ref 3.5–5.1)
Sodium: 137 mmol/L (ref 135–145)

## 2015-05-03 LAB — GLUCOSE, CAPILLARY
GLUCOSE-CAPILLARY: 99 mg/dL (ref 65–99)
Glucose-Capillary: 260 mg/dL — ABNORMAL HIGH (ref 65–99)

## 2015-05-03 LAB — LACTIC ACID, PLASMA: Lactic Acid, Venous: 2.2 mmol/L (ref 0.5–2.0)

## 2015-05-03 LAB — CBC
HEMATOCRIT: 36.5 % (ref 36.0–46.0)
HEMOGLOBIN: 11.5 g/dL — AB (ref 12.0–15.0)
MCH: 28.3 pg (ref 26.0–34.0)
MCHC: 31.5 g/dL (ref 30.0–36.0)
MCV: 89.7 fL (ref 78.0–100.0)
Platelets: 237 10*3/uL (ref 150–400)
RBC: 4.07 MIL/uL (ref 3.87–5.11)
RDW: 15 % (ref 11.5–15.5)
WBC: 9.8 10*3/uL (ref 4.0–10.5)

## 2015-05-03 MED ORDER — OXYMORPHONE HCL ER 20 MG PO TB12: 20.0000 mg | ORAL_TABLET | Freq: Two times a day (BID) | ORAL | Status: DC

## 2015-05-03 MED ORDER — ACETAMINOPHEN 325 MG PO TABS: 650.0000 mg | ORAL_TABLET | Freq: Four times a day (QID) | ORAL | Status: AC | PRN

## 2015-05-03 MED ORDER — BACLOFEN 10 MG PO TABS: 10.0000 mg | ORAL_TABLET | Freq: Two times a day (BID) | ORAL | Status: DC

## 2015-05-03 MED ORDER — TORSEMIDE 20 MG PO TABS: 20.0000 mg | ORAL_TABLET | Freq: Every day | ORAL | Status: AC

## 2015-05-03 MED ORDER — LORAZEPAM 1 MG PO TABS: 0.5000 mg | ORAL_TABLET | Freq: Two times a day (BID) | ORAL | Status: DC | PRN

## 2015-05-03 MED ORDER — POTASSIUM CHLORIDE CRYS ER 20 MEQ PO TBCR: 20.0000 meq | EXTENDED_RELEASE_TABLET | Freq: Every day | ORAL | Status: AC

## 2015-05-03 MED ORDER — PROPRANOLOL HCL 20 MG PO TABS: 20.0000 mg | ORAL_TABLET | Freq: Two times a day (BID) | ORAL | Status: DC

## 2015-05-03 NOTE — Discharge Summary (Signed)
Physician Discharge Summary  Daila Elbert ZJI:[id number] DOB: [birth year] DOA: 04/30/2015  PCP: Paris Lore, MD  Admit date: 04/30/2015 Discharge date: 05/03/2015  Time spent: 35 minutes  Recommendations for Outpatient Follow-up:  Needs bmet to follow renal function  Discharge Diagnoses:    Encephalopathy   Paraplegia following spinal cord injury ([place])   Diabetes ([place])   Chronic pain disorder   Hypertension   Chronic diastolic (congestive) heart failure ([place])   Sepsis due to Escherichia coli ([place])   Lactic acidosis   Aspiration pneumonitis ([place])   Discharge Condition: stable  Diet recommendation: heart healthy  Filed Weights   05/02/15 0510 05/03/15 0449  Weight: 91.491 kg (201 lb 11.2 oz) 93.759 kg (206 lb 11.2 oz)    History of present illness:  Chalese Peach is a 69 y.o. 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 Who came to the emergency department secondary to being found unresponsive by caregiver. According to history collected by EMS and emergency department physician, patient around lunch time and was complaining of pain for what she received more pain medication after already getting her morning dose of Opana; patient finished eating and went to bed. When the caregiver went to check on her multiple hours later found the patient to be unresponsive and with severe apnea (breathing only 6 times per minute). EMS was called and on arrival found the patient to be mildly hypoxic; patient received Narcan with excellent response. Per EMS she was also having significant rhonchi and mild wheezing for which she received 125 mg of Solu-Medrol and was started on albuterol nebulization. In day the patient blood pressure was on the soft side mild elevation of her WBCs of 14,000 range and  even more alert still a little bit confused. [facility] has been called to admit the patient for further evaluation and treatment. Chest x-ray demonstrated no acute abnormalities. Lactic acid was 4.8.  Of note, patient denies fever, chills, abdominal pain, dysuria, nausea, vomiting, headaches, chest pain, cough or any other acute complaints.  Hospital Course:   Principal Problem:  Acute encephalopathy - possibly secondary to narcotic OD - responded to narcan in ED - mental status is clear this AM  - avoid oversedation with narcotics  -tolerating opana lower dose.    Acute respiratory failure with hypoxia and hypercarbia - with ? underlying aspiration pneumonitis in the setting of narcotic OD - keep on O2 via [place]    Sepsis secondary to ?UTI/pneumonitis  - pt met criteria for sepsis on admission with WBC > 12, RR > 22, elevated lactic acid and SBP in 90's - received  Zosyn day #2 , lactic acid elevated.  - pt was recently treated with Bactrim for UTI and has not completed treatment  -lactic acid has decreased. Afebrile, discharge on Bactrim to finished 3 more days.    Paraplegia following spinal cord injury ([place]) - frequent turning    Diabetes ([place]) - continue SSI    Chronic pain disorder - avoid oversedation    Chronic diastolic (congestive) heart failure ([facility]) - no signs of volume overload on exam this AM - daily weights, strict I/O -resume lower dose of torsemide at discharge    Essential HTN - continue to hold antihypertensive regimen for now as SBP still on low end of normal   DVT prophylaxis - Heparin SQ  Procedures:  none  Consultations:  none  Discharge Exam: Filed Vitals:   05/03/15 0449  BP: 109/51  Pulse: 74  Temp: 99.1 F (37.3 C)  Resp: 20    General: NAD Cardiovascular: S 1, S 2 RRR Respiratory: CTA  Discharge Instructions   Discharge Instructions    Diet - low sodium heart healthy    Complete by:  As directed       Increase activity slowly    Complete by:  As directed           Current Discharge Medication List    START taking these medications   Details  acetaminophen (TYLENOL) 325 MG tablet Take 2 tablets (650 mg total) by mouth every 6 (six) hours as needed for mild pain (or Fever >/= 101). Qty: 30 tablet, Refills: 0      CONTINUE these medications which have CHANGED   Details  baclofen (LIORESAL) 10 MG tablet Take 1 tablet (10 mg total) by mouth 2 (two) times daily. Qty: 30 each, Refills: 0    LORazepam (ATIVAN) 1 MG tablet Take 0.5 tablets (0.5 mg total) by mouth every 12 (twelve) hours as needed for anxiety. Qty: 20 tablet, Refills: 0    oxymorphone (OPANA ER) 20 MG 12 hr tablet Take 1 tablet (20 mg total) by mouth every 12 (twelve) hours. Qty: 10 tablet, Refills: 0    potassium chloride SA (K-DUR,KLOR-CON) 20 MEQ tablet Take 1 tablet (20 mEq total) by mouth daily. Qty: 20 tablet, Refills: 0    propranolol (INDERAL) 20 MG tablet Take 1 tablet (20 mg total) by mouth 2 (two) times daily. Qty: 30 tablet, Refills: 0    torsemide (DEMADEX) 20 MG tablet Take 1 tablet (20 mg total) by mouth daily. Qty: 30 tablet, Refills: 0      CONTINUE these medications which have NOT CHANGED   Details  aspirin EC 81 MG tablet Take 81 mg by mouth every morning.     calcium-vitamin D (OSCAL WITH D) 500-200 MG-UNIT per tablet Take 1 tablet by mouth daily with breakfast.    ezetimibe (ZETIA) 10 MG tablet Take 10 mg by mouth every morning.     gabapentin (NEURONTIN) 300 MG capsule Take 300 mg by mouth 3 (three) times daily.    glimepiride (AMARYL) 2 MG tablet Take 1.5 tablets (3 mg total) by mouth daily with breakfast. Qty: 45 tablet, Refills: 1    magnesium oxide (MAG-OX) 400 MG tablet Take 400 mg by mouth 2 (two) times daily.    Multiple Vitamins-Minerals ([employer] SPECTRAVITE ADULT 50+ PO) Take 1 tablet by mouth daily.    omeprazole (PRILOSEC) 20 MG capsule Take 2 capsules (40 mg total) by mouth  daily. Qty: 60 capsule, Refills: 1    predniSONE (DELTASONE) 5 MG tablet Take 15 mg by mouth daily with breakfast.    rOPINIRole (REQUIP) 0.5 MG tablet Take 0.5 mg by mouth 2 (two) times daily. Give 1 tablet 1-3 hours before bedtime (around 1900) and than 1 tablet at bedtime (around 2200)    saccharomyces boulardii (FLORASTOR) 250 MG capsule Take 1 capsule (250 mg total) by mouth 2 (two) times daily. Qty: 60 capsule, Refills: 0    sulfamethoxazole-trimethoprim (BACTRIM DS,SEPTRA DS) 800-160 MG tablet Take 1 tablet by mouth every 12 (twelve) hours. Qty: 12 tablet, Refills: 0    tamsulosin (FLOMAX) 0.4 MG CAPS capsule Take 0.4 mg by mouth at bedtime.     traZODone (DESYREL) 50 MG tablet Take 50 mg by mouth at bedtime. Refills: 5  trifluoperazine (STELAZINE) 5 MG tablet Take 5 mg by mouth 2 (two) times daily as needed (for itching).      STOP taking these medications     hydrALAZINE (APRESOLINE) 25 MG tablet      temazepam (RESTORIL) 15 MG capsule        Allergies  Allergen Reactions  . Decadrol [Dexamethasone] Swelling    Swelling of face and stomach   . Sodium Phosphate Swelling    Swelling of face and stomach    Follow-up Information    Follow up with Paris Lore, MD.   Specialty:  Family Medicine   Contact information:   [place] [place] [address] [phone]        The results of significant diagnostics from this hospitalization (including imaging, microbiology, ancillary and laboratory) are listed below for reference.    Significant Diagnostic Studies: Dg Chest 2 View  04/30/2015  CLINICAL DATA:  Decreased responsiveness with wheezing and productive cough over the past few days. EXAM: CHEST  2 VIEW COMPARISON:  PA and lateral chest 04/23/2015.  CT chest 04/17/2015. FINDINGS: The lungs are clear. Heart size is normal. No pneumothorax or pleural effusion. No focal bony abnormality. IMPRESSION: No acute disease. Electronically Signed   By: Inge Rise M.D.   On: 04/30/2015 11:03   Dg Chest 2 View  04/23/2015  CLINICAL DATA:  Persistent fever following UTI EXAM: CHEST - 2 VIEW COMPARISON:  04/17/2015 FINDINGS: Cardiac shadow is within normal limits. The lungs are well aerated bilaterally. No focal infiltrate or sizable effusion is seen. No bony abnormality noted. IMPRESSION: No active disease. Electronically Signed   By: Inez Catalina M.D.   On: 04/23/2015 20:54   Ct Head Wo Contrast  04/17/2015  CLINICAL DATA:  Encephalopathy, possible opioid overdose, confusion EXAM: CT HEAD WITHOUT CONTRAST TECHNIQUE: Contiguous axial images were obtained from the base of the skull through the vertex without intravenous contrast. COMPARISON:  04/16/2014 FINDINGS: No evidence of parenchymal hemorrhage or extra-axial fluid collection. No mass lesion, mass effect, or midline shift. No CT evidence of acute infarction. Subcortical white matter and periventricular small vessel ischemic changes. Mild intracranial atherosclerosis. Mild cortical atrophy.  No ventriculomegaly. The visualized paranasal sinuses are essentially clear. The mastoid air cells are unopacified. No evidence of calvarial fracture. IMPRESSION: No evidence of acute intracranial abnormality. Atrophy with small vessel ischemic changes. Electronically Signed   By: Julian Hy M.D.   On: 04/17/2015 18:36   Ct Angio Chest Pe W/cm &/or Wo Cm  04/17/2015  CLINICAL DATA:  Shortness of breath and hypoxia EXAM: CT ANGIOGRAPHY CHEST WITH CONTRAST TECHNIQUE: Multidetector CT imaging of the chest was performed using the standard protocol during bolus administration of intravenous contrast. Multiplanar CT image reconstructions and MIPs were obtained to evaluate the vascular anatomy. CONTRAST:  161m OMNIPAQUE IOHEXOL 350 MG/ML SOLN COMPARISON:  None. FINDINGS: The lungs are well aerated bilaterally. Minimal atelectatic changes/ scarring is noted in the the lower lobes bilaterally. No focal confluent  infiltrate is seen. A vague ground-glass nodule is seen measuring approximately 7 mm in greatest dimension. It lies within the right upper lobe and is best visualized on image number 36 of series 12. It appears to have tiny solid focus within measuring 1-2 mm. Some other smaller scattered ground-glass changes are noted within the lower lobe on the right. The thoracic inlet is within normal limits. Mild aortic calcifications are noted without aneurysmal dilatation or dissection. Mild coronary calcifications are noted as well. No significant  hilar or mediastinal adenopathy is noted. Pulmonary artery demonstrates a normal branching pattern. No intraluminal filling defect to suggest pulmonary embolism is seen. The upper abdomen shows diffuse fatty infiltration of the liver. No other focal abnormality is seen. No bony abnormality is noted. Review of the MIP images confirms the above findings. IMPRESSION: No evidence of pulmonary embolism. Vague ground-glass nodular densities in the right upper and right lower lobes as described. Follow-up CT in 3 months is recommended to assess for stability. If the lesions persist annual surveillance for 3 years is recommended. Fatty liver. Electronically Signed   By: Inez Catalina M.D.   On: 04/17/2015 15:54   Ct Abdomen Pelvis W Contrast  04/24/2015  CLINICAL DATA:  Abdominal distention. Admitted with urinary tract infection. Quadriplegic. EXAM: CT ABDOMEN AND PELVIS WITH CONTRAST TECHNIQUE: Multidetector CT imaging of the abdomen and pelvis was performed using the standard protocol following bolus administration of intravenous contrast. CONTRAST:  46m OMNIPAQUE IOHEXOL 300 MG/ML SOLN, 1070mOMNIPAQUE IOHEXOL 300 MG/ML SOLN COMPARISON:  03/18/2007 CT abdomen/pelvis. FINDINGS: Lower chest: Subsegmental atelectasis in the bilateral lower lobe bases. Incompletely visualized 4 mm solid pulmonary nodule in the peripheral left lower lobe (series 5/ image 1), for which comparison cannot  be made to the 2008 CT study as this portion of the lungs was not included on the study. Hepatobiliary: Hepatomegaly and severe diffuse hepatic steatosis. No liver mass. Normal gallbladder with no radiopaque cholelithiasis. No biliary ductal dilatation. Pancreas: Normal, with no mass or duct dilation. Spleen: Normal size. No mass. Adrenals/Urinary Tract: Normal adrenals. No hydronephrosis. Simple exophytic 4.1 cm renal cyst in the lower left kidney. There are innumerable (> than 10 bilaterally) subcentimeter hypodense lesions in both kidneys, too small to characterize, which are increased in number since 2008. Minimally distended and grossly normal bladder. Stomach/Bowel: Tiny hiatal hernia. Otherwise grossly normal stomach. Normal caliber small bowel with no small bowel wall thickening. The appendix is not discretely visualized. Minimal sigmoid diverticulosis. No large bowel wall thickening or pericolonic fat stranding. Vascular/Lymphatic: Atherosclerotic nonaneurysmal abdominal aorta. Patent portal, splenic, hepatic and renal veins. No pathologically enlarged lymph nodes in the abdomen or pelvis. Reproductive: Status post hysterectomy, with no abnormal findings of the vaginal cuff. No adnexal mass. Other: No pneumoperitoneum, ascites or focal fluid collection. Musculoskeletal: No aggressive appearing focal osseous lesions. Marked degenerative changes in the lower lumbar spine with laminectomies at L4 and L5. IMPRESSION: 1. No evidence of bowel obstruction or acute bowel inflammation. Minimal sigmoid diverticulosis, with no evidence of acute diverticulitis. 2. Hepatomegaly with severe diffuse hepatic steatosis. 3. Left lower lobe 4 mm pulmonary nodule. If the patient is at high risk for bronchogenic carcinoma, follow-up chest CT at 1 year is recommended. If the patient is at low risk, no follow-up is needed. This recommendation follows the consensus statement: Guidelines for Management of Small Pulmonary Nodules  Detected on CT Scans: A Statement from the FlDiablo Grandes published in Radiology 2005; 237:395-400. 4. Tiny hiatal hernia. Electronically Signed   By: JaIlona Sorrel.D.   On: 04/24/2015 21:02   Dg Chest Portable 1 View  04/17/2015  CLINICAL DATA:  Short of breath.  Cough. EXAM: PORTABLE CHEST 1 VIEW COMPARISON:  01/22/2015 FINDINGS: Cardiac enlargement without heart failure. Mild bibasilar atelectasis. Negative for pneumonia IMPRESSION: Mild bibasilar atelectasis. Negative for heart failure or pneumonia. Electronically Signed   By: ChFranchot Gallo.D.   On: 04/17/2015 10:29    Microbiology: Recent Results (from the past 240 hour(s))  Urine  C&S     Status: None   Collection Time: 04/23/15  7:40 PM  Result Value Ref Range Status   Specimen Description URINE, CATHETERIZED  Final   Special Requests NONE  Final   Culture   Final    NO GROWTH 1 DAY Performed at [facility]    Report Status 04/24/2015 FINAL  Final  Blood culture (routine x 2)     Status: None   Collection Time: 04/23/15  7:40 PM  Result Value Ref Range Status   Specimen Description BLOOD RIGHT THUMB  Final   Special Requests BOTTLES DRAWN AEROBIC AND ANAEROBIC 5 ML  Final   Culture   Final    NO GROWTH 5 DAYS Performed at [facility]    Report Status 04/28/2015 FINAL  Final  Blood culture (routine x 2)     Status: None   Collection Time: 04/23/15  7:48 PM  Result Value Ref Range Status   Specimen Description BLOOD LEFT THUMB  Final   Special Requests BOTTLES DRAWN AEROBIC AND ANAEROBIC 4 CC  Final   Culture   Final    NO GROWTH 5 DAYS Performed at [facility]    Report Status 04/28/2015 FINAL  Final  MRSA PCR Screening     Status: None   Collection Time: 04/24/15 12:05 AM  Result Value Ref Range Status   MRSA by PCR NEGATIVE NEGATIVE Final    Comment:        The GeneXpert MRSA Assay (FDA approved for NASAL specimens only), is one component of a comprehensive MRSA  colonization surveillance program. It is not intended to diagnose MRSA infection nor to guide or monitor treatment for MRSA infections.   C difficile quick scan w PCR reflex     Status: Abnormal   Collection Time: 04/25/15  9:37 AM  Result Value Ref Range Status   C Diff antigen POSITIVE (A) NEGATIVE Final   C Diff toxin NEGATIVE NEGATIVE Final   C Diff interpretation   Final    C. difficile present, but toxin not detected. This indicates colonization. In most cases, this does not require treatment. If patient has signs and symptoms consistent with colitis, consider treatment. Requires ENTERIC precautions.  Urine culture     Status: None   Collection Time: 04/30/15  3:30 PM  Result Value Ref Range Status   Specimen Description URINE, CATHETERIZED  Final   Special Requests NONE  Final   Culture   Final    NO GROWTH 2 DAYS Performed at [facility]    Report Status 05/02/2015 FINAL  Final  Blood culture (routine x 2)     Status: None (Preliminary result)   Collection Time: 04/30/15  5:11 PM  Result Value Ref Range Status   Specimen Description BLOOD LEFT WRIST  Final   Special Requests BOTTLES DRAWN AEROBIC AND ANAEROBIC 5ML  Final   Culture   Final    NO GROWTH 2 DAYS Performed at [facility]    Report Status PENDING  Incomplete  Blood culture (routine x 2)     Status: None (Preliminary result)   Collection Time: 04/30/15  8:50 PM  Result Value Ref Range Status   Specimen Description BLOOD LEFT HAND  Final   Special Requests IN PEDIATRIC BOTTLE  [place]  Final   Culture   Final    NO GROWTH 2 DAYS Performed at [facility]    Report Status PENDING  Incomplete  Labs: Basic Metabolic Panel:  Recent Labs Lab 04/27/15 0540 04/30/15 1120 05/01/15 0405 05/02/15 0545 05/03/15 0550  NA 139 138 137 138 137  K 3.5 4.0 3.9 4.4 3.8  CL 108 93* 93* 93* 95*  CO2 22 34* 34* 37* 33*  GLUCOSE 135* 217* 118* 88 101*  BUN <5* '14 13 14 13  ' CREATININE  0.39* 0.85 0.75 0.69 0.63  CALCIUM 8.6* 9.0 8.7* 9.0 8.8*   Liver Function Tests: No results for input(s): AST, ALT, ALKPHOS, BILITOT, PROT, ALBUMIN in the last 168 hours. No results for input(s): LIPASE, AMYLASE in the last 168 hours. No results for input(s): AMMONIA in the last 168 hours. CBC:  Recent Labs Lab 04/30/15 1120 05/01/15 0405 05/02/15 0545 05/03/15 0550  WBC 14.4* 13.6* 10.5 9.8  NEUTROABS 12.4*  --   --   --   HGB 13.3 12.1 12.2 11.5*  HCT 42.6 38.0 39.2 36.5  MCV 90.1 89.8 91.2 89.7  PLT 281 290 253 237   Cardiac Enzymes:  Recent Labs Lab 04/30/15 1120  TROPONINI <0.03   BNP: BNP (last 3 results)  Recent Labs  01/21/15 1648 04/17/15 1043 04/30/15 1120  BNP 99.7 77.7 46.4    ProBNP (last 3 results) No results for input(s): PROBNP in the last 8760 hours.  CBG:  Recent Labs Lab 05/02/15 0808 05/02/15 1215 05/02/15 1700 05/02/15 2102 05/03/15 0801  GLUCAP 76 264* 188* 151* 99       Signed:  Keyleen Cerrato A  [facility] 05/03/2015, 10:50 AM

## 2015-05-03 NOTE — Progress Notes (Signed)
Pt given discharge instructions and reviewed with son prior to discharge. Pt discharged via wheelchair to transport van set up by son. All belongings were sent home with pt. No questions or complaints from the pt at this time.  Kinsie Belford W Spruha Weight, RN.

## 2015-05-05 LAB — CULTURE, BLOOD (ROUTINE X 2)
CULTURE: NO GROWTH
Culture: NO GROWTH

## 2015-08-09 ENCOUNTER — Emergency Department (HOSPITAL_COMMUNITY): Payer: Medicare Other

## 2015-08-09 ENCOUNTER — Encounter (HOSPITAL_COMMUNITY): Payer: Self-pay | Admitting: Emergency Medicine

## 2015-08-09 ENCOUNTER — Inpatient Hospital Stay (HOSPITAL_COMMUNITY): Payer: Medicare Other

## 2015-08-09 ENCOUNTER — Inpatient Hospital Stay (HOSPITAL_COMMUNITY)
Admission: EM | Admit: 2015-08-09 | Discharge: 2015-08-16 | DRG: 871 | Disposition: A | Discharge status: Home Health | Payer: Medicare Other | Attending: Internal Medicine | Admitting: Internal Medicine

## 2015-08-09 DIAGNOSIS — G8929 Other chronic pain: Secondary | ICD-10-CM | POA: Diagnosis present

## 2015-08-09 DIAGNOSIS — G894 Chronic pain syndrome: Secondary | ICD-10-CM | POA: Diagnosis not present

## 2015-08-09 DIAGNOSIS — I3139 Other pericardial effusion (noninflammatory): Secondary | ICD-10-CM | POA: Diagnosis present

## 2015-08-09 DIAGNOSIS — Z794 Long term (current) use of insulin: Secondary | ICD-10-CM

## 2015-08-09 DIAGNOSIS — Z9049 Acquired absence of other specified parts of digestive tract: Secondary | ICD-10-CM | POA: Diagnosis not present

## 2015-08-09 DIAGNOSIS — I313 Pericardial effusion (noninflammatory): Secondary | ICD-10-CM | POA: Diagnosis present

## 2015-08-09 DIAGNOSIS — A419 Sepsis, unspecified organism: Secondary | ICD-10-CM | POA: Diagnosis present

## 2015-08-09 DIAGNOSIS — Z6838 Body mass index (BMI) 38.0-38.9, adult: Secondary | ICD-10-CM

## 2015-08-09 DIAGNOSIS — Z87891 Personal history of nicotine dependence: Secondary | ICD-10-CM | POA: Diagnosis not present

## 2015-08-09 DIAGNOSIS — G9341 Metabolic encephalopathy: Secondary | ICD-10-CM | POA: Diagnosis not present

## 2015-08-09 DIAGNOSIS — Z7982 Long term (current) use of aspirin: Secondary | ICD-10-CM | POA: Diagnosis not present

## 2015-08-09 DIAGNOSIS — E872 Acidosis, unspecified: Secondary | ICD-10-CM | POA: Diagnosis present

## 2015-08-09 DIAGNOSIS — I5032 Chronic diastolic (congestive) heart failure: Secondary | ICD-10-CM | POA: Diagnosis not present

## 2015-08-09 DIAGNOSIS — Z87440 Personal history of urinary (tract) infections: Secondary | ICD-10-CM

## 2015-08-09 DIAGNOSIS — N390 Urinary tract infection, site not specified: Secondary | ICD-10-CM | POA: Diagnosis present

## 2015-08-09 DIAGNOSIS — R0902 Hypoxemia: Secondary | ICD-10-CM

## 2015-08-09 DIAGNOSIS — R4182 Altered mental status, unspecified: Secondary | ICD-10-CM

## 2015-08-09 DIAGNOSIS — I5033 Acute on chronic diastolic (congestive) heart failure: Secondary | ICD-10-CM | POA: Diagnosis present

## 2015-08-09 DIAGNOSIS — Z79899 Other long term (current) drug therapy: Secondary | ICD-10-CM

## 2015-08-09 DIAGNOSIS — I10 Essential (primary) hypertension: Secondary | ICD-10-CM | POA: Diagnosis present

## 2015-08-09 DIAGNOSIS — Z8701 Personal history of pneumonia (recurrent): Secondary | ICD-10-CM | POA: Diagnosis not present

## 2015-08-09 DIAGNOSIS — R0602 Shortness of breath: Secondary | ICD-10-CM

## 2015-08-09 DIAGNOSIS — E0811 Diabetes mellitus due to underlying condition with ketoacidosis with coma: Secondary | ICD-10-CM

## 2015-08-09 DIAGNOSIS — E785 Hyperlipidemia, unspecified: Secondary | ICD-10-CM | POA: Diagnosis present

## 2015-08-09 DIAGNOSIS — R627 Adult failure to thrive: Secondary | ICD-10-CM | POA: Diagnosis present

## 2015-08-09 DIAGNOSIS — J9602 Acute respiratory failure with hypercapnia: Secondary | ICD-10-CM | POA: Diagnosis present

## 2015-08-09 DIAGNOSIS — K5900 Constipation, unspecified: Secondary | ICD-10-CM | POA: Diagnosis present

## 2015-08-09 DIAGNOSIS — J9601 Acute respiratory failure with hypoxia: Secondary | ICD-10-CM | POA: Diagnosis present

## 2015-08-09 DIAGNOSIS — R400 Somnolence: Secondary | ICD-10-CM

## 2015-08-09 DIAGNOSIS — I480 Paroxysmal atrial fibrillation: Secondary | ICD-10-CM

## 2015-08-09 DIAGNOSIS — R918 Other nonspecific abnormal finding of lung field: Secondary | ICD-10-CM

## 2015-08-09 DIAGNOSIS — A408 Other streptococcal sepsis: Secondary | ICD-10-CM

## 2015-08-09 DIAGNOSIS — G8250 Quadriplegia, unspecified: Secondary | ICD-10-CM | POA: Diagnosis present

## 2015-08-09 DIAGNOSIS — E669 Obesity, unspecified: Secondary | ICD-10-CM | POA: Diagnosis present

## 2015-08-09 DIAGNOSIS — E119 Type 2 diabetes mellitus without complications: Secondary | ICD-10-CM | POA: Insufficient documentation

## 2015-08-09 DIAGNOSIS — K219 Gastro-esophageal reflux disease without esophagitis: Secondary | ICD-10-CM | POA: Diagnosis present

## 2015-08-09 DIAGNOSIS — R0600 Dyspnea, unspecified: Secondary | ICD-10-CM | POA: Diagnosis not present

## 2015-08-09 DIAGNOSIS — R197 Diarrhea, unspecified: Secondary | ICD-10-CM | POA: Diagnosis not present

## 2015-08-09 HISTORY — DX: Chronic diastolic (congestive) heart failure: I50.32

## 2015-08-09 HISTORY — DX: Other pericardial effusion (noninflammatory): I31.39

## 2015-08-09 HISTORY — DX: Personal history of urinary (tract) infections: Z87.440

## 2015-08-09 HISTORY — DX: Other nonspecific abnormal finding of lung field: R91.8

## 2015-08-09 HISTORY — DX: Sepsis, unspecified organism: A41.9

## 2015-08-09 HISTORY — DX: Type 2 diabetes mellitus without complications: E11.9

## 2015-08-09 HISTORY — DX: Personal history of other infectious and parasitic diseases: Z86.19

## 2015-08-09 HISTORY — DX: Pericardial effusion (noninflammatory): I31.3

## 2015-08-09 HISTORY — DX: Unspecified atrial fibrillation: I48.91

## 2015-08-09 LAB — COMPREHENSIVE METABOLIC PANEL
ALBUMIN: 3.6 g/dL (ref 3.5–5.0)
ALK PHOS: 51 U/L (ref 38–126)
ALT: 32 U/L (ref 14–54)
AST: 31 U/L (ref 15–41)
Anion gap: 11 (ref 5–15)
BUN: 13 mg/dL (ref 6–20)
CALCIUM: 8.9 mg/dL (ref 8.9–10.3)
CHLORIDE: 95 mmol/L — AB (ref 101–111)
CO2: 33 mmol/L — AB (ref 22–32)
CREATININE: 0.72 mg/dL (ref 0.44–1.00)
GFR calc non Af Amer: >60 mL/min (ref >60)
GLUCOSE: 264 mg/dL — AB (ref 65–99)
Potassium: 4.4 mmol/L (ref 3.5–5.1)
SODIUM: 139 mmol/L (ref 135–145)
Total Bilirubin: 1 mg/dL (ref 0.3–1.2)
Total Protein: 6.5 g/dL (ref 6.5–8.1)

## 2015-08-09 LAB — APTT: APTT: 21 s — AB (ref 24–37)

## 2015-08-09 LAB — HEPATIC FUNCTION PANEL
ALBUMIN: 3.6 g/dL (ref 3.5–5.0)
ALT: 34 U/L (ref 14–54)
AST: 28 U/L (ref 15–41)
Alkaline Phosphatase: 50 U/L (ref 38–126)
BILIRUBIN TOTAL: 0.9 mg/dL (ref 0.3–1.2)
Bilirubin, Direct: 0.2 mg/dL (ref 0.1–0.5)
Indirect Bilirubin: 0.7 mg/dL (ref 0.3–0.9)
Total Protein: 6.5 g/dL (ref 6.5–8.1)

## 2015-08-09 LAB — I-STAT CG4 LACTIC ACID, ED
LACTIC ACID, VENOUS: 3.53 mmol/L — AB (ref 0.5–2.0)
Lactic Acid, Venous: 1.98 mmol/L (ref 0.5–2.0)

## 2015-08-09 LAB — BLOOD GAS, VENOUS
ACID-BASE EXCESS: 6.9 mmol/L — AB (ref 0.0–2.0)
Bicarbonate: 34.5 mEq/L — ABNORMAL HIGH (ref 20.0–24.0)
O2 SAT: 89.2 %
PCO2 VEN: 63.5 mmHg — AB (ref 45.0–50.0)
PH VEN: 7.354 — AB (ref 7.250–7.300)
Patient temperature: 98.6
TCO2: 30.5 mmol/L (ref 0–100)
pO2, Ven: 64.1 mmHg — ABNORMAL HIGH (ref 30.0–45.0)

## 2015-08-09 LAB — GLUCOSE, CAPILLARY
GLUCOSE-CAPILLARY: 185 mg/dL — AB (ref 65–99)
Glucose-Capillary: 155 mg/dL — ABNORMAL HIGH (ref 65–99)

## 2015-08-09 LAB — URINALYSIS, ROUTINE W REFLEX MICROSCOPIC
BILIRUBIN URINE: NEGATIVE
GLUCOSE, UA: NEGATIVE mg/dL
Ketones, ur: NEGATIVE mg/dL
Nitrite: NEGATIVE
PROTEIN: NEGATIVE mg/dL
Specific Gravity, Urine: 1.011 (ref 1.005–1.030)
pH: 8 (ref 5.0–8.0)

## 2015-08-09 LAB — TROPONIN I

## 2015-08-09 LAB — INFLUENZA PANEL BY PCR (TYPE A & B)
H1N1 flu by pcr: NOT DETECTED
INFLAPCR: NEGATIVE
INFLBPCR: NEGATIVE

## 2015-08-09 LAB — CBC
HCT: 45.8 % (ref 36.0–46.0)
Hemoglobin: 14.2 g/dL (ref 12.0–15.0)
MCH: 26.9 pg (ref 26.0–34.0)
MCHC: 31 g/dL (ref 30.0–36.0)
MCV: 86.9 fL (ref 78.0–100.0)
PLATELETS: 212 10*3/uL (ref 150–400)
RBC: 5.27 MIL/uL — AB (ref 3.87–5.11)
RDW: 14.9 % (ref 11.5–15.5)
WBC: 14.6 10*3/uL — ABNORMAL HIGH (ref 4.0–10.5)

## 2015-08-09 LAB — URINE MICROSCOPIC-ADD ON: SQUAMOUS EPITHELIAL / LPF: NONE SEEN

## 2015-08-09 LAB — TSH: TSH: 2.092 u[IU]/mL (ref 0.350–4.500)

## 2015-08-09 LAB — PROCALCITONIN

## 2015-08-09 LAB — PROTIME-INR
INR: 1.05 (ref 0.00–1.49)
PROTHROMBIN TIME: 13.9 s (ref 11.6–15.2)

## 2015-08-09 LAB — BRAIN NATRIURETIC PEPTIDE: B NATRIURETIC PEPTIDE 5: 130.8 pg/mL — AB (ref 0.0–100.0)

## 2015-08-09 LAB — MRSA PCR SCREENING: MRSA by PCR: NEGATIVE

## 2015-08-09 LAB — LACTIC ACID, PLASMA: LACTIC ACID, VENOUS: 2 mmol/L (ref 0.5–2.0)

## 2015-08-09 LAB — D-DIMER, QUANTITATIVE (NOT AT ARMC): D DIMER QUANT: 0.55 ug{FEU}/mL — AB (ref 0.00–0.50)

## 2015-08-09 LAB — LIPASE, BLOOD: LIPASE: 20 U/L (ref 11–51)

## 2015-08-09 MED ORDER — ONDANSETRON HCL 4 MG/2ML IJ SOLN: 4.0000 mg | Freq: Four times a day (QID) | INTRAMUSCULAR | Status: DC | PRN

## 2015-08-09 MED ORDER — LEVALBUTEROL HCL 0.63 MG/3ML IN NEBU: 0.6300 mg | INHALATION_SOLUTION | Freq: Four times a day (QID) | RESPIRATORY_TRACT | Status: DC | PRN

## 2015-08-09 MED ORDER — PIPERACILLIN-TAZOBACTAM 3.375 G IVPB
3.3750 g | Freq: Three times a day (TID) | INTRAVENOUS | Status: DC
  Administered 2015-08-09 – 2015-08-16 (×19): 3.375 g via INTRAVENOUS
  Filled 2015-08-09 (×16): qty 50

## 2015-08-09 MED ORDER — PIPERACILLIN-TAZOBACTAM 3.375 G IVPB 30 MIN
3.3750 g | Freq: Once | INTRAVENOUS | Status: AC
  Administered 2015-08-09: 3.375 g via INTRAVENOUS
  Filled 2015-08-09: qty 50

## 2015-08-09 MED ORDER — SODIUM CHLORIDE 0.9 % IV BOLUS (SEPSIS)
500.0000 mL | Freq: Once | INTRAVENOUS | Status: AC
  Administered 2015-08-09: 500 mL via INTRAVENOUS

## 2015-08-09 MED ORDER — ONDANSETRON HCL 4 MG PO TABS: 4.0000 mg | ORAL_TABLET | Freq: Four times a day (QID) | ORAL | Status: DC | PRN

## 2015-08-09 MED ORDER — VITAMINS A & D EX OINT
TOPICAL_OINTMENT | CUTANEOUS | Status: AC
  Administered 2015-08-09: 1
  Filled 2015-08-09: qty 5

## 2015-08-09 MED ORDER — LORAZEPAM 0.5 MG PO TABS: 0.5000 mg | ORAL_TABLET | Freq: Two times a day (BID) | ORAL | Status: DC | PRN

## 2015-08-09 MED ORDER — ACETAMINOPHEN 650 MG RE SUPP: 650.0000 mg | Freq: Four times a day (QID) | RECTAL | Status: DC | PRN

## 2015-08-09 MED ORDER — PANTOPRAZOLE SODIUM 40 MG PO TBEC
40.0000 mg | DELAYED_RELEASE_TABLET | Freq: Every day | ORAL | Status: DC
  Administered 2015-08-10 – 2015-08-16 (×7): 40 mg via ORAL
  Filled 2015-08-09 (×7): qty 1

## 2015-08-09 MED ORDER — ENOXAPARIN SODIUM 100 MG/ML ~~LOC~~ SOLN
90.0000 mg | Freq: Two times a day (BID) | SUBCUTANEOUS | Status: DC
  Administered 2015-08-10 – 2015-08-11 (×3): 90 mg via SUBCUTANEOUS
  Filled 2015-08-09 (×6): qty 1

## 2015-08-09 MED ORDER — IOHEXOL 300 MG/ML  SOLN
100.0000 mL | Freq: Once | INTRAMUSCULAR | Status: AC | PRN
  Administered 2015-08-09: 100 mL via INTRAVENOUS

## 2015-08-09 MED ORDER — ACETAMINOPHEN 500 MG PO TABS
1000.0000 mg | ORAL_TABLET | Freq: Once | ORAL | Status: AC
  Administered 2015-08-09: 1000 mg via ORAL
  Filled 2015-08-09: qty 2

## 2015-08-09 MED ORDER — SODIUM CHLORIDE 0.9 % IV BOLUS (SEPSIS)
1000.0000 mL | Freq: Once | INTRAVENOUS | Status: AC
  Administered 2015-08-09: 1000 mL via INTRAVENOUS

## 2015-08-09 MED ORDER — CETYLPYRIDINIUM CHLORIDE 0.05 % MT LIQD
7.0000 mL | Freq: Two times a day (BID) | OROMUCOSAL | Status: DC
  Administered 2015-08-10 – 2015-08-15 (×8): 7 mL via OROMUCOSAL

## 2015-08-09 MED ORDER — METHYLPREDNISOLONE SODIUM SUCC 125 MG IJ SOLR
60.0000 mg | Freq: Three times a day (TID) | INTRAMUSCULAR | Status: DC
  Administered 2015-08-09 – 2015-08-11 (×5): 60 mg via INTRAVENOUS
  Filled 2015-08-09 (×5): qty 2

## 2015-08-09 MED ORDER — IOHEXOL 300 MG/ML  SOLN
25.0000 mL | Freq: Once | INTRAMUSCULAR | Status: AC | PRN
  Administered 2015-08-09: 25 mL via ORAL

## 2015-08-09 MED ORDER — ACETAMINOPHEN 325 MG PO TABS
650.0000 mg | ORAL_TABLET | Freq: Four times a day (QID) | ORAL | Status: DC | PRN
  Administered 2015-08-10 – 2015-08-11 (×3): 650 mg via ORAL
  Filled 2015-08-09 (×3): qty 2

## 2015-08-09 MED ORDER — INSULIN ASPART 100 UNIT/ML ~~LOC~~ SOLN: 0.0000 [IU] | Freq: Three times a day (TID) | SUBCUTANEOUS | Status: DC

## 2015-08-09 MED ORDER — SODIUM CHLORIDE 0.9% FLUSH
3.0000 mL | Freq: Two times a day (BID) | INTRAVENOUS | Status: DC
Start: 2015-08-09 — End: 2015-08-16
  Administered 2015-08-09 – 2015-08-14 (×7): 3 mL via INTRAVENOUS

## 2015-08-09 MED ORDER — VANCOMYCIN HCL IN DEXTROSE 1-5 GM/200ML-% IV SOLN
1000.0000 mg | Freq: Two times a day (BID) | INTRAVENOUS | Status: DC
  Administered 2015-08-09 – 2015-08-10 (×3): 1000 mg via INTRAVENOUS
  Filled 2015-08-09 (×2): qty 200

## 2015-08-09 MED ORDER — TAMSULOSIN HCL 0.4 MG PO CAPS
0.4000 mg | ORAL_CAPSULE | Freq: Every day | ORAL | Status: DC
  Administered 2015-08-10 – 2015-08-15 (×6): 0.4 mg via ORAL
  Filled 2015-08-09 (×6): qty 1

## 2015-08-09 MED ORDER — SODIUM CHLORIDE 0.9 % IV SOLN
INTRAVENOUS | Status: DC
  Administered 2015-08-09 – 2015-08-11 (×3): via INTRAVENOUS

## 2015-08-09 MED ORDER — CHLORHEXIDINE GLUCONATE 0.12 % MT SOLN
15.0000 mL | Freq: Two times a day (BID) | OROMUCOSAL | Status: DC
  Administered 2015-08-09 – 2015-08-16 (×9): 15 mL via OROMUCOSAL
  Filled 2015-08-09 (×9): qty 15

## 2015-08-09 MED ORDER — SACCHAROMYCES BOULARDII 250 MG PO CAPS
250.0000 mg | ORAL_CAPSULE | Freq: Two times a day (BID) | ORAL | Status: DC
  Administered 2015-08-10 – 2015-08-16 (×13): 250 mg via ORAL
  Filled 2015-08-09 (×13): qty 1

## 2015-08-09 MED ORDER — VANCOMYCIN HCL IN DEXTROSE 1-5 GM/200ML-% IV SOLN
1000.0000 mg | Freq: Once | INTRAVENOUS | Status: AC
  Administered 2015-08-09: 1000 mg via INTRAVENOUS
  Filled 2015-08-09: qty 200

## 2015-08-09 NOTE — ED Notes (Signed)
MD does not want sepsis IV fluid bolus at present

## 2015-08-09 NOTE — ED Notes (Signed)
Will let pt get CT head before transport to floor

## 2015-08-09 NOTE — ED Notes (Signed)
RN and NT getting urine. RN to draw labs when completed

## 2015-08-09 NOTE — H&P (Addendum)
[facility] History and Physical  [name] [name] WJX:[id number] DOB: 1946/04/03 DOA: 08/09/2015  Referring physician: Cathlean Cower  PCP: [name], MD   Chief Complaint: Fatigue, generalized abdominal pain  HPI:  70 y.o. 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 Who came to the emergency department brought in via EMS with complaints of generalized abdominal pain, nausea, received 4 mg of Zofran by EMS, apparently has been constipated for 1 week, intermittent nausea but no vomiting. Patient son who provides most of the history states that she was on antibiotics 2 weeks ago for pneumonia, patient's oxygen saturation on room air was found to be 88%. She was also found to have a fever of 101.2, patient is somnolent and arousable to sternal lab but unable to provide any meaningful history. Upon exam the patient was found to be confused, 99% on 4 L of oxygen, ER initiated code sepsis with vancomycin, Zosyn, cultures, and small IV fluid bolus because of the patient's history of CHF. Lab work showed a lactate of 3.5, WBC 14.6, venous pH 7.35 with CO2 63, BNP 130. Chest x-ray negative acute. Based on reassuring chest x-ray and BNP, gave more fluid to total 30 mg/kg. attained CT to rule out acute intra-abdominal process. CT without acute findings to explain the patient's fever. UA indicates infection and I suspect a UTI is the source of the patient's sepsis, PCCM  was called and , Dr. Kendrick Fries thought that the patient was appropriate for stepdown admission.        Review of Systems:  Unable to perform ROS: Mental status change      Past Medical History  Diagnosis Date  . GERD (gastroesophageal reflux disease)   . Quadriplegia (HCC)   . Hypertension   . Pressure ulcer of foot   . Chronic pain  disorder   . Hyperlipidemia   . UTI (urinary tract infection)     reoccuring  . Occasional tremors   . CHF (congestive heart failure) [facility])      Past Surgical History  Procedure Laterality Date  . Back surgery  08/2013  . Abdominal hysterectomy        Social History:  reports that she has quit smoking. Her smoking use included Cigarettes. She smoked 0.25 packs per day. She has never used smokeless tobacco. She reports that she does not drink alcohol or use illicit drugs.    Allergies  Allergen Reactions  . Decadrol [Dexamethasone] Swelling    Swelling of face and stomach   . Sodium Phosphate Swelling    Swelling of face and stomach     Family History  Problem Relation Age of Onset  . Adopted: Yes        Prior to Admission medications   Medication Sig Start Date End Date Taking? Authorizing Provider  acetaminophen (TYLENOL) 325 MG tablet Take 2 tablets (650 mg total) by mouth every 6 (six) hours as needed for mild pain (or Fever >/= 101). 05/03/15  Yes Belkys A Regalado, MD  aspirin EC 81 MG tablet Take 81 mg by mouth every morning.    Yes Historical Provider, MD  baclofen (LIORESAL) 10 MG tablet Take 1 tablet (10 mg total) by mouth 2 (two) times daily. 05/03/15  Yes Belkys A Regalado, MD  calcium-vitamin D (OSCAL WITH D) 500-200 MG-UNIT per tablet Take 1 tablet by mouth daily with breakfast.  Yes Historical Provider, MD  gabapentin (NEURONTIN) 300 MG capsule Take 300 mg by mouth 3 (three) times daily.   Yes Historical Provider, MD  glimepiride (AMARYL) 2 MG tablet Take 1.5 tablets (3 mg total) by mouth daily with breakfast. Patient taking differently: Take 2 mg by mouth daily with breakfast.  04/27/15  Yes Vassie Loll, MD  LORazepam (ATIVAN) 1 MG tablet Take 0.5 tablets (0.5 mg total) by mouth every 12 (twelve) hours as needed for anxiety. 05/03/15  Yes Belkys A Regalado, MD  magnesium oxide (MAG-OX) 400 MG tablet Take 400 mg by mouth 2 (two) times daily.   Yes  Historical Provider, MD  Multiple Vitamins-Minerals ([employer] SPECTRAVITE ADULT 50+ PO) Take 1 tablet by mouth daily.   Yes Historical Provider, MD  omeprazole (PRILOSEC) 20 MG capsule Take 2 capsules (40 mg total) by mouth daily. Patient taking differently: Take 20 mg by mouth daily.  04/27/15  Yes Vassie Loll, MD  oxymorphone (OPANA ER) 20 MG 12 hr tablet Take 1 tablet (20 mg total) by mouth every 12 (twelve) hours. 05/03/15  Yes Belkys A Regalado, MD  potassium chloride SA (K-DUR,KLOR-CON) 20 MEQ tablet Take 1 tablet (20 mEq total) by mouth daily. 05/03/15  Yes Belkys A Regalado, MD  predniSONE (DELTASONE) 5 MG tablet Take 15 mg by mouth daily with breakfast.   Yes Historical Provider, MD  propranolol (INDERAL) 20 MG tablet Take 1 tablet (20 mg total) by mouth 2 (two) times daily. 05/03/15  Yes Belkys A Regalado, MD  rOPINIRole (REQUIP) 0.5 MG tablet Take 0.5 mg by mouth 2 (two) times daily. Give 1 tablet 1-3 hours before bedtime (around 1900) and than 1 tablet at bedtime (around 2200)   Yes Historical Provider, MD  rosuvastatin (CRESTOR) 10 MG tablet Take 10 mg by mouth daily. 08/07/15  Yes Historical Provider, MD  saccharomyces boulardii (FLORASTOR) 250 MG capsule Take 1 capsule (250 mg total) by mouth 2 (two) times daily. 04/27/15  Yes Vassie Loll, MD  tamsulosin (FLOMAX) 0.4 MG CAPS capsule Take 0.4 mg by mouth at bedtime.    Yes Historical Provider, MD  torsemide (DEMADEX) 20 MG tablet Take 1 tablet (20 mg total) by mouth daily. 05/03/15  Yes Belkys A Regalado, MD  traZODone (DESYREL) 50 MG tablet Take 50 mg by mouth at bedtime. 04/14/15  Yes Historical Provider, MD  trifluoperazine (STELAZINE) 5 MG tablet Take 5 mg by mouth 2 (two) times daily as needed (for itching).   Yes Historical Provider, MD  sulfamethoxazole-trimethoprim (BACTRIM DS,SEPTRA DS) 800-160 MG tablet Take 1 tablet by mouth every 12 (twelve) hours. Patient not taking: Reported on 08/09/2015 04/27/15   Vassie Loll, MD      Physical Exam: Filed Vitals:   08/09/15 1545 08/09/15 1600 08/09/15 1633 08/09/15 1645  BP: 101/52 89/53 93/49  110/59  Pulse: 72 75 72 79  Temp:      TempSrc:      Resp: Weight:      SpO2: 99% 99% 100% 97%    Constitutional: She appears chronically ill appearing but moon facies,  Ill-appearing, mildly dyspneic  HENT:  Head: Normocephalic and atraumatic.  dry mucous membranes  Eyes: Conjunctivae are normal. Pupils are equal, round, and reactive to light.  Neck: Neck supple.  Cardiovascular: Normal rate, regular rhythm and normal heart sounds.  No murmur heard. Pulmonary/Chest:  Mildly increased WOB w/ tachypnea, diminished BS and crackles b/l  Abdominal: Soft. Bowel sounds are normal. She exhibits no distension. There  is no rebound and no guarding.  Generalized TTP  Musculoskeletal:  Trace B/L LE edema  Neurological:  Sleepy but awake, oriented to person and place, mild confusion  Skin: Skin is warm and dry.  Nursing note and vitals reviewed. Skin: Warm, dry and intact. No rash, cyanosis, or clubbing.  Psychiatric: Normal mood and affect. speech and behavior is normal. Judgment and thought content normal. Cognition and memory are normal.      Data Review   Micro Results Recent Results (from the past 240 hour(s))  Blood Culture (routine x 2)     Status: None (Preliminary result)   Collection Time: 08/09/15 10:50 AM  Result Value Ref Range Status   Specimen Description BLOOD LEFT HAND  Final   Special Requests   Final    BOTTLES DRAWN AEROBIC AND ANAEROBIC 5CC Performed at [facility]    Culture PENDING  Incomplete   Report Status PENDING  Incomplete    Radiology Reports Dg Chest 2 View  08/09/2015  CLINICAL DATA:  Generalized weakness, fever EXAM: CHEST  2 VIEW COMPARISON:  04/30/2015 FINDINGS: Borderline cardiomegaly. No infiltrate or pulmonary edema. Bony thorax is unremarkable. IMPRESSION: No active cardiopulmonary disease.   Borderline cardiomegaly. Electronically Signed   By: Natasha Mead M.D.   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 Patient diagnosed with pneumonia approximately 2 weeks ago for which she is completing antibiotic therapy. EXAM: CT ABDOMEN AND PELVIS WITH CONTRAST TECHNIQUE: Multidetector CT imaging of the abdomen and pelvis was performed using the standard protocol following bolus administration of intravenous contrast. CONTRAST:  OMNIPAQUE IOHEXOL 300 MG/ML IV. Oral contrast was also administered. COMPARISON:  04/24/2015, 03/18/2007. FINDINGS: Respiratory motion blurs images of the lower chest and upper abdomen. The study appears diagnostic, however. Lower chest: Linear scarring in the lower lobes, unchanged. Visualized lung bases otherwise clear. The 4 mm left lower lobe nodule identified on the October, 2016 examination was not imaged today. Heart mildly to moderately enlarged with a very large amount of epicardial fat as noted previously. Hepatobiliary: Mild hepatomegaly with diffuse hepatic steatosis with focal sparing surrounding the gallbladder and scattered areas of focal sparing peripherally in both lobes, less severe than on the October, 2016 examination. No significant focal hepatic parenchymal abnormality. Gallbladder normal in appearance without calcified gallstones. No biliary ductal dilation. Pancreas: Normal in appearance without evidence of mass, ductal dilation, or inflammation. Spleen: Normal in size and appearance. Adrenals/Urinary Tract: Normal appearing adrenal glands. Numerous cortical cysts involving both kidneys, the largest arising from the lower pole of the left kidney measuring approximately 4.5 cm. All mild diffuse cortical thinning involving both kidneys as noted previously.  Atherosclerotic calcification involving the distal right main renal artery at the right renal hilus. No hydronephrosis. No urinary tract calculi. Urinary bladder unremarkable. Stomach/Bowel: Stomach normal in appearance for the degree of distention. Normal-appearing small bowel. Descending and sigmoid colon diverticulosis without evidence of acute diverticulitis. Moderate colonic stool burden. Surgical clips at the cecum from prior appendectomy. 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 Visceral arteries patent though atherosclerotic. No pathologic lymphadenopathy. Reproductive: Uterus and ovaries surgically absent. No adnexal masses. Other: Atrophic paraspinous muscles and visualized lower extremity muscles indicating long-standing tissues. Musculoskeletal: Osseous demineralization. Degenerative disc disease at L3-4 L4-5. Degenerative retrolisthesis of L4 relative to L5. Severe diffuse facet degenerative  changes involving the lumbar spine. No acute abnormality. IMPRESSION: 1. No acute abnormalities involving the abdomen or pelvis. 2. Descending and sigmoid colon diverticulosis without evidence of acute diverticulitis. 3. Mild hepatomegaly with diffuse hepatic steatosis. Focal sparing surrounding the gallbladder and peripherally in both hepatic lobes. No significant focal hepatic parenchymal abnormality. Steatosis is less severe than on the October, 2016 CT. 4. 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 Electronically Signed   By: Hulan Saas M.D.   On: 08/09/2015 13:32     CBC  Recent Labs Lab 08/09/15 1050  WBC 14.6*  HGB 14.2  HCT 45.8  PLT 212  MCV 86.9  MCH 26.9  MCHC 31.0  RDW 14.9    Chemistries   Recent Labs Lab 08/09/15 1050  NA 139  K 4.4  CL 95*  CO2 33*  GLUCOSE 264*  BUN 13  CREATININE 0.72  CALCIUM 8.9  AST 31  ALT 32  ALKPHOS 51  BILITOT 1.0    ------------------------------------------------------------------------------------------------------------------ estimated creatinine clearance is 68.5 mL/min (by C-G formula based on Cr of 0.72). ------------------------------------------------------------------------------------------------------------------ No results for input(s): HGBA1C in the last 72 hours. ------------------------------------------------------------------------------------------------------------------ No results for input(s): CHOL, HDL, LDLCALC, TRIG, CHOLHDL, LDLDIRECT in the last 72 hours. ------------------------------------------------------------------------------------------------------------------ No results for input(s): TSH, T4TOTAL, T3FREE, THYROIDAB in the last 72 hours.  Invalid input(s): FREET3 ------------------------------------------------------------------------------------------------------------------ No results for input(s): VITAMINB12, FOLATE, FERRITIN, TIBC, IRON, RETICCTPCT in the last 72 hours.  Coagulation profile No results for input(s): INR, PROTIME in the last 168 hours.  No results for input(s): DDIMER in the last 72 hours.  Cardiac Enzymes No results for input(s): CKMB, TROPONINI, MYOGLOBIN in the last 168 hours.  Invalid input(s): CK ------------------------------------------------------------------------------------------------------------------ Invalid input(s): POCBNP   CBG: No results for input(s): GLUCAP in the last 168 hours.     EKG: Independently reviewed.    Assessment/Plan Principal Problem:   Sepsis (HCC), Source likely urine, also suspect possible aspiration as the patient is obtunded even if  the initial chest x-ray was negative Initiated sepsis protocol Follow blood culture, urine culture results Continue IV fluids per sepsis protocol    Acute hypoxemic hypercapnic respiratory failure Unclear etiology, cannot rule out aspiration pneumonia the  patient is quite confused and somnolent Continue Empiric coverage for pneumonia, possible PE, COPD exacerbation Check d-dimer,VQ scan in am if d-dimer is abnormal , in the meantime we'll treat with therapeutic dose of Lovenox to cover for possible PE Hold all sedating medications Patient has chronic  Metabolic/toxic encephalopathy Patient on multiple medications including Neurontin, lorazepam, Opana, requip, trazodone, stelazine  Patient found to be in hypercapnic hypoxic respiratory failure Will repeat ABG in am and see if it is improving on BiPAP      UTI (lower urinary tract infection)   history of ESBL , started on Zosyn Follow urine culture     Diabetes (HCC) -hold oral medications, patient will be placed on sliding scale insulin, doubt DKA, anion gap 11, bicarbonate 33    Chronic pain disorder - hold all sedating medications for now    Hypertension-patient hypotensive upon admission hold antihypertensive medications and continue IV fluids    Quadriplegia (HCC)- stable    Chronic diastolic (congestive) heart failure (HCC)- without exacerbation, last 2-D echo was 03/14/14, EF 65-70%  resume lower dose of torsemide at discharge        Code Status Orders        Start     Ordered   08/09/15 1701  Full code   Continuous  08/09/15 1702    Code Status History     Family Communication: bedside Disposition Plan: admit   Total time spent 55 minutes.Greater than 50% of this time was spent in counseling, explanation of diagnosis, planning of further management, and coordination of care  [facility] Pager [phone]  If 7PM-7AM, please contact night-coverage [email or web address] Password [facility] 08/09/2015, 5:04 PM

## 2015-08-09 NOTE — Progress Notes (Addendum)
BIPAP not needed at this time. Pt in no respiratory distress and vitals stable. Pt currently on 4lpm [place],chest x-ray clear.

## 2015-08-09 NOTE — ED Notes (Signed)
Pt from home. Reports generalized abd pain since last night accompanied by nausea. EMS gave  zofran which has relieved nausea. Pt reports she has not had a "good" BM in a week. Pt also has a HA. Was diagnosed with PNA 2 weeks ago, has been taking her abx. Pt's room air 02 sat was 88% on RA with EMS. EMS put pt on 4L [place]. Pt was 88% on 2L [place] on arrival. 99% on 4L [place]. Also has a fever

## 2015-08-09 NOTE — ED Notes (Signed)
Patient transported to CT 

## 2015-08-09 NOTE — ED Provider Notes (Signed)
CSN: [id number]     Arrival date & time 08/09/15  1007 History   First MD Initiated Contact with Patient 08/09/15 1023     Chief Complaint  Patient presents with  . Fever  . Abdominal Pain     (Consider location/radiation/quality/duration/timing/severity/associated sxs/prior Treatment) HPI Comments: 70yo F w/ PMH including CHF, GERD, HTN, HLD, frequent UTIs who p/w fever and shortness of breath. History limited because of the patient's altered mentation and obtained from her son. He reports that a few days ago, they called the fire department and because the patient was feeling short of breath. She had been lifted with a chair lift and he thought that it had been too tight and had caused bruised ribs, so she did not seek medical attention at that time. Patient reports that she began having generalized abdominal pain last night associated with nausea. She states that she has not had a bowel movement in 1 week. She also reports gradual onset of headache. She began feeling sweaty this morning and was noted to have a fever on arrival. EMS noted that she was 88% on room air and was placed on 4 L nasal cannula. Patient endorses shortness of breath and cough that began today. Son notes that she was treated a few weeks ago for urinary tract infection. She reports having dysuria for "a long time."  LEVEL 5 CAVEAT 2/2 AMS  Patient is a 70 y.o. female presenting with fever and abdominal pain. The history is provided by the patient and a relative.  Fever Abdominal Pain Associated symptoms: fever     Past Medical History  Diagnosis Date  . GERD (gastroesophageal reflux disease)   . Quadriplegia (HCC)   . Hypertension   . Pressure ulcer of foot   . Chronic pain disorder   . Hyperlipidemia   . UTI (urinary tract infection)     reoccuring  . Occasional tremors   . CHF (congestive heart failure) [facility])    Past Surgical History  Procedure Laterality Date  . Back surgery  08/2013  . Abdominal  hysterectomy     Family History  Problem Relation Age of Onset  . Adopted: Yes   Social History  Substance Use Topics  . Smoking status: Former Smoker -- 0.25 packs/day    Types: Cigarettes  . Smokeless tobacco: Never Used  . Alcohol Use: No   OB History    No data available     Review of Systems  Unable to perform ROS: Mental status change  Constitutional: Positive for fever.  Gastrointestinal: Positive for abdominal pain.      Allergies  Decadrol and Sodium phosphate  Home Medications   Prior to Admission medications   Medication Sig Start Date End Date Taking? Authorizing Provider  acetaminophen (TYLENOL) 325 MG tablet Take 2 tablets (650 mg total) by mouth every 6 (six) hours as needed for mild pain (or Fever >/= 101). 05/03/15  Yes Belkys A Regalado, MD  aspirin EC 81 MG tablet Take 81 mg by mouth every morning.    Yes Historical Provider, MD  baclofen (LIORESAL) 10 MG tablet Take 1 tablet (10 mg total) by mouth 2 (two) times daily. 05/03/15  Yes Belkys A Regalado, MD  calcium-vitamin D (OSCAL WITH D) 500-200 MG-UNIT per tablet Take 1 tablet by mouth daily with breakfast.   Yes Historical Provider, MD  gabapentin (NEURONTIN) 300 MG capsule Take 300 mg by mouth 3 (three) times daily.   Yes Historical Provider, MD  glimepiride (AMARYL) 2  MG tablet Take 1.5 tablets (3 mg total) by mouth daily with breakfast. Patient taking differently: Take 2 mg by mouth daily with breakfast.  04/27/15  Yes Vassie Loll, MD  LORazepam (ATIVAN) 1 MG tablet Take 0.5 tablets (0.5 mg total) by mouth every 12 (twelve) hours as needed for anxiety. 05/03/15  Yes Belkys A Regalado, MD  magnesium oxide (MAG-OX) 400 MG tablet Take 400 mg by mouth 2 (two) times daily.   Yes Historical Provider, MD  Multiple Vitamins-Minerals ([employer] SPECTRAVITE ADULT 50+ PO) Take 1 tablet by mouth daily.   Yes Historical Provider, MD  omeprazole (PRILOSEC) 20 MG capsule Take 2 capsules (40 mg total) by mouth  daily. Patient taking differently: Take 20 mg by mouth daily.  04/27/15  Yes Vassie Loll, MD  oxymorphone (OPANA ER) 20 MG 12 hr tablet Take 1 tablet (20 mg total) by mouth every 12 (twelve) hours. 05/03/15  Yes Belkys A Regalado, MD  potassium chloride SA (K-DUR,KLOR-CON) 20 MEQ tablet Take 1 tablet (20 mEq total) by mouth daily. 05/03/15  Yes Belkys A Regalado, MD  predniSONE (DELTASONE) 5 MG tablet Take 15 mg by mouth daily with breakfast.   Yes Historical Provider, MD  propranolol (INDERAL) 20 MG tablet Take 1 tablet (20 mg total) by mouth 2 (two) times daily. 05/03/15  Yes Belkys A Regalado, MD  rOPINIRole (REQUIP) 0.5 MG tablet Take 0.5 mg by mouth 2 (two) times daily. Give 1 tablet 1-3 hours before bedtime (around 1900) and than 1 tablet at bedtime (around 2200)   Yes Historical Provider, MD  rosuvastatin (CRESTOR) 10 MG tablet Take 10 mg by mouth daily. 08/07/15  Yes Historical Provider, MD  saccharomyces boulardii (FLORASTOR) 250 MG capsule Take 1 capsule (250 mg total) by mouth 2 (two) times daily. 04/27/15  Yes Vassie Loll, MD  tamsulosin (FLOMAX) 0.4 MG CAPS capsule Take 0.4 mg by mouth at bedtime.    Yes Historical Provider, MD  torsemide (DEMADEX) 20 MG tablet Take 1 tablet (20 mg total) by mouth daily. 05/03/15  Yes Belkys A Regalado, MD  traZODone (DESYREL) 50 MG tablet Take 50 mg by mouth at bedtime. 04/14/15  Yes Historical Provider, MD  trifluoperazine (STELAZINE) 5 MG tablet Take 5 mg by mouth 2 (two) times daily as needed (for itching).   Yes Historical Provider, MD  sulfamethoxazole-trimethoprim (BACTRIM DS,SEPTRA DS) 800-160 MG tablet Take 1 tablet by mouth every 12 (twelve) hours. Patient not taking: Reported on 08/09/2015 04/27/15   Vassie Loll, MD   BP 93/80 mmHg  Pulse 71  Temp(Src) 98.9 F (37.2 C) (Oral)  Resp 12  Wt 200 lb (90.719 kg)  SpO2 99% Physical Exam  Constitutional: She appears well-developed and well-nourished. No distress.  Ill-appearing, mildly  dyspneic  HENT:  Head: Normocephalic and atraumatic.  dry mucous membranes  Eyes: Conjunctivae are normal. Pupils are equal, round, and reactive to light.  Neck: Neck supple.  Cardiovascular: Normal rate, regular rhythm and normal heart sounds.   No murmur heard. Pulmonary/Chest:  Mildly increased WOB w/ tachypnea, diminished BS and crackles b/l  Abdominal: Soft. Bowel sounds are normal. She exhibits no distension. There is no rebound and no guarding.  Generalized TTP  Musculoskeletal:  Trace B/L LE edema  Neurological:  Sleepy but awake, oriented to person and place, mild confusion  Skin: Skin is warm and dry.  Nursing note and vitals reviewed.   ED Course  .Critical Care Performed by: Laurence Spates Authorized by: Laurence Spates Total critical care  time: 45 minutes Critical care time was exclusive of separately billable procedures and treating other patients. Critical care was necessary to treat or prevent imminent or life-threatening deterioration of the following conditions: sepsis. Critical care was time spent personally by me on the following activities: development of treatment plan with patient or surrogate, discussions with consultants, evaluation of patient's response to treatment, examination of patient, obtaining history from patient or surrogate, ordering and performing treatments and interventions, ordering and review of laboratory studies, ordering and review of radiographic studies, re-evaluation of patient's condition and review of old charts.   (including critical care time) Labs Review Labs Reviewed  COMPREHENSIVE METABOLIC PANEL - Abnormal; Notable for the following:    Chloride 95 (*)    CO2 33 (*)    Glucose, Bld 264 (*)    All other components within normal limits  CBC - Abnormal; Notable for the following:    WBC 14.6 (*)    RBC 5.27 (*)    All other components within normal limits  URINALYSIS, ROUTINE W REFLEX MICROSCOPIC (NOT AT [facility]) -  Abnormal; Notable for the following:    Hgb urine dipstick SMALL (*)    Leukocytes, UA LARGE (*)    All other components within normal limits  BRAIN NATRIURETIC PEPTIDE - Abnormal; Notable for the following:    B Natriuretic Peptide 130.8 (*)    All other components within normal limits  BLOOD GAS, VENOUS - Abnormal; Notable for the following:    pH, Ven 7.354 (*)    pCO2, Ven 63.5 (*)    pO2, Ven 64.1 (*)    Bicarbonate 34.5 (*)    Acid-Base Excess 6.9 (*)    All other components within normal limits  URINE MICROSCOPIC-ADD ON - Abnormal; Notable for the following:    Bacteria, UA MANY (*)    All other components within normal limits  I-STAT CG4 LACTIC ACID, ED - Abnormal; Notable for the following:    Lactic Acid, Venous 3.53 (*)    All other components within normal limits  CULTURE, BLOOD (ROUTINE X 2)  CULTURE, BLOOD (ROUTINE X 2)  URINE CULTURE  LIPASE, BLOOD  I-STAT CG4 LACTIC ACID, ED    Imaging Review Dg Chest 2 View  08/09/2015  CLINICAL DATA:  Generalized weakness, fever EXAM: CHEST  2 VIEW COMPARISON:  04/30/2015 FINDINGS: Borderline cardiomegaly. No infiltrate or pulmonary edema. Bony thorax is unremarkable. IMPRESSION: No active cardiopulmonary disease.  Borderline cardiomegaly. Electronically Signed   By: Natasha Mead M.D.   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 Patient diagnosed with pneumonia approximately 2 weeks ago for which she is completing antibiotic therapy. EXAM: CT ABDOMEN AND PELVIS WITH CONTRAST TECHNIQUE: Multidetector CT imaging of the abdomen and pelvis was performed using the standard protocol following bolus administration of intravenous contrast. CONTRAST:  OMNIPAQUE IOHEXOL 300 MG/ML IV. Oral contrast was also administered. COMPARISON:  04/24/2015,  03/18/2007. FINDINGS: Respiratory motion blurs images of the lower chest and upper abdomen. The study appears diagnostic, however. Lower chest: Linear scarring in the lower lobes, unchanged. Visualized lung bases otherwise clear. The 4 mm left lower lobe nodule identified on the October, 2016 examination was not imaged today. Heart mildly to moderately enlarged with a very large amount of epicardial fat as noted previously. Hepatobiliary: Mild hepatomegaly with diffuse hepatic  steatosis with focal sparing surrounding the gallbladder and scattered areas of focal sparing peripherally in both lobes, less severe than on the October, 2016 examination. No significant focal hepatic parenchymal abnormality. Gallbladder normal in appearance without calcified gallstones. No biliary ductal dilation. Pancreas: Normal in appearance without evidence of mass, ductal dilation, or inflammation. Spleen: Normal in size and appearance. Adrenals/Urinary Tract: Normal appearing adrenal glands. Numerous cortical cysts involving both kidneys, the largest arising from the lower pole of the left kidney measuring approximately 4.5 cm. All mild diffuse cortical thinning involving both kidneys as noted previously. Atherosclerotic calcification involving the distal right main renal artery at the right renal hilus. No hydronephrosis. No urinary tract calculi. Urinary bladder unremarkable. Stomach/Bowel: Stomach normal in appearance for the degree of distention. Normal-appearing small bowel. Descending and sigmoid colon diverticulosis without evidence of acute diverticulitis. Moderate colonic stool burden. Surgical clips at the cecum from prior appendectomy. 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 Visceral arteries patent though atherosclerotic. No pathologic lymphadenopathy. Reproductive: Uterus and ovaries surgically absent. No adnexal masses. Other: Atrophic paraspinous muscles and visualized lower  extremity muscles indicating long-standing tissues. Musculoskeletal: Osseous demineralization. Degenerative disc disease at L3-4 L4-5. Degenerative retrolisthesis of L4 relative to L5. Severe diffuse facet degenerative changes involving the lumbar spine. No acute abnormality. IMPRESSION: 1. No acute abnormalities involving the abdomen or pelvis. 2. Descending and sigmoid colon diverticulosis without evidence of acute diverticulitis. 3. Mild hepatomegaly with diffuse hepatic steatosis. Focal sparing surrounding the gallbladder and peripherally in both hepatic lobes. No significant focal hepatic parenchymal abnormality. Steatosis is less severe than on the October, 2016 CT. 4. 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 Electronically Signed   By: Hulan Saas M.D.   On: 08/09/2015 13:32   I have personally reviewed and evaluated these lab results as part of my medical decision-making.   EKG Interpretation   Date/Time:  Thursday August 09 2015 10:39:49 EST Ventricular Rate:  78 PR Interval:  156 QRS Duration: 76 QT Interval:  388 QTC Calculation: 442 R Axis:   18 Text Interpretation:  Sinus rhythm Borderline T wave abnormalities  Baseline wander in lead(s) V6 No significant change since last tracing  Confirmed by LITTLE MD, RACHEL [phone]) on 08/09/2015 11:34:09 AM     Medications  piperacillin-tazobactam (ZOSYN) IVPB 3.375 g (not administered)  vancomycin (VANCOCIN) IVPB 1000 mg/200 mL premix (not administered)  piperacillin-tazobactam (ZOSYN) IVPB 3.375 g (0 g Intravenous Stopped 08/09/15 1235)  vancomycin (VANCOCIN) IVPB 1000 mg/200 mL premix (0 mg Intravenous Stopped 08/09/15 1450)  acetaminophen (TYLENOL) tablet 1,000 mg (1,000 mg Oral Given 08/09/15 1113)  sodium chloride 0.9 % bolus 500 mL (0 mLs Intravenous Stopped 08/09/15 1225)  sodium chloride 0.9 % bolus 500 mL (0 mLs Intravenous Stopped 08/09/15 1336)  iohexol (OMNIPAQUE) 300 MG/ML solution 25 mL (25 mLs Oral  Contrast Given 08/09/15 1244)  sodium chloride 0.9 % bolus 1,000 mL (0 mLs Intravenous Stopped 08/09/15 1527)  sodium chloride 0.9 % bolus 500 mL (0 mLs Intravenous Stopped 08/09/15 1343)  iohexol (OMNIPAQUE) 300 MG/ML solution 100 mL (100 mLs Intravenous Contrast Given 08/09/15 1244)   MDM   Final diagnoses:  Sepsis, due to unspecified organism [facility])  UTI (lower urinary tract infection)  Hypoxia    Pt p/w abdominal pain associated with nausea that began last night. Patient recently treated for UTI and finished antibiotics last week. On arrival, patient was awake but slightly confused, in no acute distress. O2 sat 99% on 4L [place]; patient does not normally require  oxygen. She was febrile to 101.2. No focal abdominal tenderness but patient complaining of generalized tenderness to palpation. Given abnormal vital signs, initiated a code sepsis with vancomycin, Zosyn, cultures, and small IV fluid bolus because of the patient's history of CHF. Lab work showed  a lactate of 3.5, WBC 14.6, venous pH 7.35 with CO2 63, BNP 130. Chest x-ray negative acute. Based on reassuring chest x-ray and BNP, gave more fluid to total 30 mg/kg. attained CT to rule out acute intra-abdominal process. CT without acute findings to explain the patient's fever. UA indicates infection and I suspect a UTI is the source of the patient's sepsis. Repeat lactate after IV fluids is normal. The patient is mildly hypotensive in the 90s systolic but the remainder of her vital signs have been reassuring and her mental status has been stable. I discussed with critical care, Dr. Kendrick Fries, who felt patient is stable for stepdown with repeat lactate normal. Discussed with Abrol and pt admitted in guarded condition.   Laurence Spates, MD 08/09/15 [phone]

## 2015-08-09 NOTE — ED Notes (Signed)
Son [name] left phone number [phone].

## 2015-08-09 NOTE — ED Notes (Signed)
Pt in CT.

## 2015-08-09 NOTE — Procedures (Signed)
BiPAP is not needed at this time.  Pt is resting comfortably on 4L HR-80, RR-12, SpO2-100.  RT will continue to monitor pt and will place if needed or requested.

## 2015-08-09 NOTE — ED Notes (Signed)
Patient transported to X-ray 

## 2015-08-09 NOTE — Progress Notes (Signed)
ANTICOAGULATION CONSULT NOTE - Initial Consult  Pharmacy Consult for Lovenox Indication: pulmonary embolus and DVT  Allergies  Allergen Reactions  . Decadrol [Dexamethasone] Swelling    Swelling of face and stomach   . Sodium Phosphate Swelling    Swelling of face and stomach     Patient Measurements: Weight: 200 lb (90.719 kg)  Vital Signs: Temp: 98.9 F (37.2 C) (02/02 1224) Temp Source: Oral (02/02 1224) BP: 97/47 mmHg (02/02 1704) Pulse Rate: 75 (02/02 1704)  Labs:  Recent Labs  08/09/15 1050  HGB 14.2  HCT 45.8  PLT 212  CREATININE 0.72    Estimated Creatinine Clearance: 68.5 mL/min (by C-G formula based on Cr of 0.72).   Medical History: Past Medical History  Diagnosis Date  . GERD (gastroesophageal reflux disease)   . Quadriplegia (HCC)   . Hypertension   . Pressure ulcer of foot   . Chronic pain disorder   . Hyperlipidemia   . UTI (urinary tract infection)     reoccuring  . Occasional tremors   . CHF (congestive heart failure) (HCC)     Medications:  Scheduled:  . [START ON 08/10/2015] insulin aspart  0-9 Units Subcutaneous TID WC  . methylPREDNISolone (SOLU-MEDROL) injection  60 mg Intravenous 3 times per day  . sodium chloride flush  3 mL Intravenous Q12H    AssessmentL 69 yoF admitted with AMS started on Vanc & Zosyn for presumed urosepsis.  VQ scan pending to rule out DVT.   CBC wnl.  No bleeding reported.   Renal function at baseline.  CrCl > 83ml/min.   Goal of Therapy:  Anti-Xa level 0.6-1 units/ml 4hrs after LMWH dose given Monitor platelets by anticoagulation protocol: Yes   Plan:  Lovenox  sq q12h Monitor CBC at least q3 days *Change to prophylactic dose in am if VQ scan negative per MD order  Elson Clan 08/09/2015,5:27 PM

## 2015-08-09 NOTE — Progress Notes (Signed)
ANTIBIOTIC CONSULT NOTE   Pharmacy Consult for Vancomycin & Zosyn Indication: sepsis  Allergies  Allergen Reactions  . Decadrol [Dexamethasone] Swelling    Swelling of face and stomach   . Sodium Phosphate Swelling    Swelling of face and stomach     Patient Measurements:   Total body weight:   Vital Signs: Temp: 101.2 F (38.4 C) (02/02 1020) Temp Source: Oral (02/02 1020) BP: 111/51 mmHg (02/02 1020) Pulse Rate: 79 (02/02 1020) Intake/Output from previous day:   Intake/Output from this shift:    Labs: No results for input(s): WBC, HGB, PLT, LABCREA, CREATININE in the last 72 hours. CrCl cannot be calculated (Unknown ideal weight.). No results for input(s): VANCOTROUGH, VANCOPEAK, VANCORANDOM, GENTTROUGH, GENTPEAK, GENTRANDOM, TOBRATROUGH, TOBRAPEAK, TOBRARND, AMIKACINPEAK, AMIKACINTROU, AMIKACIN in the last 72 hours.   Microbiology: No results found for this or any previous visit (from the past 720 hour(s)).  Medical History: Past Medical History  Diagnosis Date  . GERD (gastroesophageal reflux disease)   . Quadriplegia (HCC)   . Hypertension   . Pressure ulcer of foot   . Chronic pain disorder   . Hyperlipidemia   . UTI (urinary tract infection)     reoccuring  . Occasional tremors   . CHF (congestive heart failure) (HCC)    Medications:  Scheduled:  . acetaminophen  1,000 mg Oral Once   Anti-infectives    Start     Dose/Rate Route Frequency Ordered Stop   08/09/15 1030  piperacillin-tazobactam (ZOSYN) IVPB 3.375 g     3.375 g 100 mL/hr over 30 Minutes Intravenous  Once 08/09/15 1026     08/09/15 1030  vancomycin (VANCOCIN) IVPB 1000 mg/200 mL premix     1,000 mg 200 mL/hr over 60 Minutes Intravenous  Once 08/09/15 1026       Assessment: 69 yoF to ED with sx of abd pain/nausea, headache. Sats per EMS 88% on RA, started on 4L, sats 99%. Patient stated treatment for recent UTI a few weeks ago, abx unknown. Begin Vanc/Zosyn for rule out sepsis.  Goal  of Therapy:  Vancomycin trough level 15-20 mcg/ml  Plan:   Zosyn 3.375gm IV q8hr- 4 hr infusion  Vancomycin 1gm in ED, followed by 1gm q12hr  Vancomycin trough at steady state if necessary  Otho Bellows PharmD Pager [phone] 08/09/2015, 12:16 PM

## 2015-08-10 ENCOUNTER — Inpatient Hospital Stay (HOSPITAL_COMMUNITY): Payer: Medicare Other

## 2015-08-10 LAB — CBC WITH DIFFERENTIAL/PLATELET
BASOS PCT: 0 %
Basophils Absolute: 0 10*3/uL (ref 0.0–0.1)
Eosinophils Absolute: 0 10*3/uL (ref 0.0–0.7)
Eosinophils Relative: 0 %
HEMATOCRIT: 42.3 % (ref 36.0–46.0)
HEMOGLOBIN: 13.3 g/dL (ref 12.0–15.0)
Lymphocytes Relative: 6 %
Lymphs Abs: 0.8 10*3/uL (ref 0.7–4.0)
MCH: 27.5 pg (ref 26.0–34.0)
MCHC: 31.4 g/dL (ref 30.0–36.0)
MCV: 87.4 fL (ref 78.0–100.0)
MONO ABS: 0.2 10*3/uL (ref 0.1–1.0)
MONOS PCT: 2 %
NEUTROS ABS: 12.1 10*3/uL — AB (ref 1.7–7.7)
Neutrophils Relative %: 92 %
Platelets: 198 10*3/uL (ref 150–400)
RBC: 4.84 MIL/uL (ref 3.87–5.11)
RDW: 14.8 % (ref 11.5–15.5)
WBC: 13.2 10*3/uL — ABNORMAL HIGH (ref 4.0–10.5)

## 2015-08-10 LAB — BLOOD GAS, ARTERIAL
ACID-BASE EXCESS: 0.7 mmol/L (ref 0.0–2.0)
ACID-BASE EXCESS: 0.1 mmol/L (ref 0.0–2.0)
Bicarbonate: 28.1 mEq/L — ABNORMAL HIGH (ref 20.0–24.0)
Bicarbonate: 27.5 mEq/L — ABNORMAL HIGH (ref 20.0–24.0)
DELIVERY SYSTEMS: POSITIVE
DRAWN BY: 42624
DRAWN BY: 295031
EXPIRATORY PAP: 5
FIO2: 0.4
Inspiratory PAP: 12
MODE: POSITIVE
O2 Content: 4 L/min
O2 Saturation: 95.6 %
O2 Saturation: 96.2 %
PATIENT TEMPERATURE: 98.6
PCO2 ART: 60.7 mmHg — AB (ref 35.0–45.0)
PH ART: 7.286 — AB (ref 7.350–7.450)
PO2 ART: 127 mmHg — AB (ref 80.0–100.0)
Patient temperature: 98.6
RATE: 12 resp/min
TCO2: 25.8 mmol/L (ref 0–100)
TCO2: 25.2 mmol/L (ref 0–100)
pCO2 arterial: 59.6 mmHg (ref 35.0–45.0)
pH, Arterial: 7.288 — ABNORMAL LOW (ref 7.350–7.450)
pO2, Arterial: 89 mmHg (ref 80.0–100.0)

## 2015-08-10 LAB — COMPREHENSIVE METABOLIC PANEL
ALBUMIN: 3.3 g/dL — AB (ref 3.5–5.0)
ALK PHOS: 47 U/L (ref 38–126)
ALT: 31 U/L (ref 14–54)
ANION GAP: 10 (ref 5–15)
AST: 27 U/L (ref 15–41)
BILIRUBIN TOTAL: 0.9 mg/dL (ref 0.3–1.2)
BUN: 12 mg/dL (ref 6–20)
CALCIUM: 8.2 mg/dL — AB (ref 8.9–10.3)
CO2: 27 mmol/L (ref 22–32)
Chloride: 102 mmol/L (ref 101–111)
Creatinine, Ser: 0.72 mg/dL (ref 0.44–1.00)
GLUCOSE: 310 mg/dL — AB (ref 65–99)
POTASSIUM: 4.8 mmol/L (ref 3.5–5.1)
Sodium: 139 mmol/L (ref 135–145)
TOTAL PROTEIN: 6.3 g/dL — AB (ref 6.5–8.1)

## 2015-08-10 LAB — GLUCOSE, CAPILLARY
GLUCOSE-CAPILLARY: 280 mg/dL — AB (ref 65–99)
GLUCOSE-CAPILLARY: 257 mg/dL — AB (ref 65–99)
GLUCOSE-CAPILLARY: 324 mg/dL — AB (ref 65–99)
Glucose-Capillary: 268 mg/dL — ABNORMAL HIGH (ref 65–99)
Glucose-Capillary: 289 mg/dL — ABNORMAL HIGH (ref 65–99)

## 2015-08-10 LAB — URINE CULTURE: Culture: NO GROWTH

## 2015-08-10 LAB — LACTIC ACID, PLASMA
LACTIC ACID, VENOUS: 2.5 mmol/L — AB (ref 0.5–2.0)
Lactic Acid, Venous: 2.1 mmol/L (ref 0.5–2.0)

## 2015-08-10 LAB — TROPONIN I
Troponin I: <0.03 ng/mL (ref <0.031)
Troponin I: <0.03 ng/mL (ref <0.031)

## 2015-08-10 LAB — PHOSPHORUS: PHOSPHORUS: 3.1 mg/dL (ref 2.5–4.6)

## 2015-08-10 LAB — MAGNESIUM: MAGNESIUM: 2.1 mg/dL (ref 1.7–2.4)

## 2015-08-10 MED ORDER — LORAZEPAM 2 MG/ML IJ SOLN
INTRAMUSCULAR | Status: AC
  Filled 2015-08-10: qty 1

## 2015-08-10 MED ORDER — LORAZEPAM 2 MG/ML IJ SOLN
0.5000 mg | Freq: Once | INTRAMUSCULAR | Status: AC
  Administered 2015-08-10: 0.5 mg via INTRAVENOUS

## 2015-08-10 MED ORDER — IPRATROPIUM BROMIDE 0.02 % IN SOLN
0.5000 mg | Freq: Once | RESPIRATORY_TRACT | Status: DC
  Filled 2015-08-10: qty 2.5

## 2015-08-10 MED ORDER — KETOROLAC TROMETHAMINE 15 MG/ML IJ SOLN
15.0000 mg | Freq: Once | INTRAMUSCULAR | Status: AC
Start: 2015-08-10 — End: 2015-08-10
  Administered 2015-08-10: 15 mg via INTRAVENOUS
  Filled 2015-08-10: qty 1

## 2015-08-10 MED ORDER — LEVALBUTEROL HCL 1.25 MG/0.5ML IN NEBU
1.2500 mg | INHALATION_SOLUTION | Freq: Once | RESPIRATORY_TRACT | Status: DC
  Filled 2015-08-10: qty 0.5

## 2015-08-10 MED ORDER — DEXTROSE 5 % IV SOLN
500.0000 mg | INTRAVENOUS | Status: AC
  Administered 2015-08-10 – 2015-08-12 (×3): 500 mg via INTRAVENOUS
  Filled 2015-08-10 (×3): qty 500

## 2015-08-10 MED ORDER — INSULIN ASPART 100 UNIT/ML ~~LOC~~ SOLN: 0.0000 [IU] | Freq: Three times a day (TID) | SUBCUTANEOUS | Status: DC

## 2015-08-10 MED ORDER — INSULIN GLARGINE 100 UNIT/ML ~~LOC~~ SOLN
10.0000 [IU] | Freq: Every day | SUBCUTANEOUS | Status: DC
  Administered 2015-08-10: 10 [IU] via SUBCUTANEOUS
  Filled 2015-08-10 (×2): qty 0.1

## 2015-08-10 MED ORDER — INSULIN ASPART 100 UNIT/ML ~~LOC~~ SOLN
0.0000 [IU] | SUBCUTANEOUS | Status: DC
  Administered 2015-08-10 (×3): 5 [IU] via SUBCUTANEOUS

## 2015-08-10 MED ORDER — SODIUM CHLORIDE 0.9 % IV BOLUS (SEPSIS)
500.0000 mL | Freq: Once | INTRAVENOUS | Status: AC
  Administered 2015-08-10: 500 mL via INTRAVENOUS

## 2015-08-10 MED ORDER — INSULIN GLARGINE 100 UNIT/ML ~~LOC~~ SOLN
10.0000 [IU] | Freq: Every day | SUBCUTANEOUS | Status: DC
  Filled 2015-08-10: qty 0.1

## 2015-08-10 MED ORDER — ZOLPIDEM TARTRATE 5 MG PO TABS
5.0000 mg | ORAL_TABLET | Freq: Once | ORAL | Status: AC
  Administered 2015-08-10: 5 mg via ORAL
  Filled 2015-08-10: qty 1

## 2015-08-10 MED ORDER — INSULIN ASPART 100 UNIT/ML ~~LOC~~ SOLN
0.0000 [IU] | Freq: Three times a day (TID) | SUBCUTANEOUS | Status: DC
  Administered 2015-08-10: 11 [IU] via SUBCUTANEOUS
  Administered 2015-08-11: 5 [IU] via SUBCUTANEOUS
  Administered 2015-08-11 (×2): 8 [IU] via SUBCUTANEOUS
  Administered 2015-08-12: 5 [IU] via SUBCUTANEOUS
  Administered 2015-08-12: 8 [IU] via SUBCUTANEOUS
  Administered 2015-08-13: 5 [IU] via SUBCUTANEOUS
  Administered 2015-08-13: 3 [IU] via SUBCUTANEOUS
  Administered 2015-08-13 – 2015-08-14 (×2): 5 [IU] via SUBCUTANEOUS
  Administered 2015-08-14: 3 [IU] via SUBCUTANEOUS
  Administered 2015-08-14: 5 [IU] via SUBCUTANEOUS
  Administered 2015-08-15 (×2): 3 [IU] via SUBCUTANEOUS
  Administered 2015-08-15: 8 [IU] via SUBCUTANEOUS

## 2015-08-10 MED ORDER — INSULIN ASPART 100 UNIT/ML ~~LOC~~ SOLN
0.0000 [IU] | Freq: Every day | SUBCUTANEOUS | Status: DC
  Administered 2015-08-10: 3 [IU] via SUBCUTANEOUS
  Administered 2015-08-11 – 2015-08-15 (×3): 2 [IU] via SUBCUTANEOUS

## 2015-08-10 MED ORDER — INSULIN ASPART 100 UNIT/ML ~~LOC~~ SOLN: 10.0000 [IU] | Freq: Once | SUBCUTANEOUS | Status: DC

## 2015-08-10 NOTE — Progress Notes (Signed)
CRITICAL VALUE ALERT  Critical value received:  PCO2 60.7  Date of notification:  08/10/2015  Time of notification:  0550  Critical value read back:Yes.    Nurse who received alert:  Effie Berkshire  MD notified (1st page):  L Harduk  Time of first page:  0552  MD notified (2nd page):  Time of second page:  Responding MD:  Wyn Forster  Time MD responded:  [phone]

## 2015-08-10 NOTE — Progress Notes (Addendum)
[name] [name] OZH:[id number] DOB: [birth year] DOA: 08/09/2015 PCP: [name], MD  Assessment at time of admission by Dr Susie Cassette on 08/09/15 70 y.o. 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 Who came to the emergency department brought in via EMS with complaints of generalized abdominal pain, nausea, received 4 mg of Zofran by EMS, apparently has been constipated for 1 week, intermittent nausea but no vomiting. Patient son who provides most of the history states that she was on antibiotics 2 weeks ago for pneumonia, patient's oxygen saturation on room air was found to be 88%. She was also found to have a fever of 101.2, patient is somnolent and arousable to sternal lab but unable to provide any meaningful history. Upon exam the patient was found to be confused, 99% on 4 L of oxygen, ER initiated code sepsis with vancomycin, Zosyn, cultures, and small IV fluid bolus because of the patient's history of CHF. Lab work showed a lactate of 3.5, WBC 14.6, venous pH 7.35 with CO2 63, BNP 130. Chest x-ray negative acute. Based on reassuring chest x-ray and BNP, gave more fluid to total 30 mg/kg. attained CT to rule out acute intra-abdominal process. CT without acute findings to explain the patient's fever. UA indicates infection and I suspect a UTI is the source of the patient's sepsis, PCCM was called and , Dr. Kendrick Fries thought that the patient was appropriate for stepdown admission. Summary&Daily Progress Notes since admission 08/10/15: I saw and examined [name] [name] at bedside, reviewed her chart and discussed with nursing. I also called her son [name] and left a message. Appreciate PCCM. Patient required BIPAP support overnight and remains confused,, baseline mentation not clear at present. There is possibility of  aspiration/CAP not evident on initial CXR because of dehydration. Will repeat chest x-ray since patient is now hydrated, obtain CBC/CMP/magnesium/phosphorus/lactic acid/abg, add Zithromax to cover atypical organisms for possible pneumonia, d/c V/Q scan, continue therapeutic doses of Lovenox for now and consider CTA chest in am depending on clinical progress. Problem List Plan  Principal Problem:   Sepsis (HCC) Active Problems:   UTI (lower urinary tract infection)   Metabolic encephalopathy   Diabetes (HCC)   Chronic pain disorder   Hypertension   Quadriplegia (HCC)   Chronic diastolic (congestive) heart failure (HCC)   Lactic acidosis   Acute respiratory failure with hypoxia and hypercarbia (HCC)    Repeat chest x-ray/abg/cbc/cmp/magnesium/phosphorus/lactic acid  DC V/Q scan as patient unstable to transfer for study-?CT chest with contrast tomorrow  Continue Lovenox at therapeutic dose for now  Day 2 vanc/Zosyn  Zithromax  Follow pulmonary and critical care recommendations   Code Status: Full Code Family Communication: Called patient's son [name] at [phone] and left message to call back Disposition Plan: To be determined. Keep in SDU today Consultants:  None Procedures:  On BIPAP Antibiotics:  Vancomycin 08/09/15>>  Zosyn 08/09/15>>  Zithromax 08/10/15>>  HPI/Subjective: Patient not saying much  Objective: Filed Vitals:   08/10/15 0400 08/10/15 0600  BP: 101/49 131/69  Pulse:  92  Temp: 98.3 F (36.8 C)   Resp: 17 16    Intake/Output Summary (Last 24 hours) at 08/10/15 0840 Last data filed at 08/10/15 0600  Gross per 24 hour  Intake   4825 ml  Output      0 ml  Net   4825 ml   Ceasar Mons  Weights   08/09/15 1053 08/09/15 1800 08/10/15 0400  Weight: 90.719 kg (200 lb) 92.3 kg (203 lb 7.8 oz) 93.4 kg (205 lb 14.6 oz)    Exam:   General:  Comfortable on BIPAP.  Cardiovascular: S1-S2 normal. No murmurs. Pulse regular.  Respiratory: Reduced air entry  bilaterally. Scant rhonchi, no rales or wheezing.  Abdomen: Soft and nontender. Normal bowel sounds. No organomegaly.  Musculoskeletal: No pedal edema   Neurological: Intact  Data Reviewed: Basic Metabolic Panel:  Recent Labs Lab 08/09/15 1050  NA 139  K 4.4  CL 95*  CO2 33*  GLUCOSE 264*  BUN 13  CREATININE 0.72  CALCIUM 8.9   Liver Function Tests:  Recent Labs Lab 08/09/15 1050 08/09/15 1838  AST 31 28  ALT 32 34  ALKPHOS 51 50  BILITOT 1.0 0.9  PROT 6.5 6.5  ALBUMIN 3.6 3.6    Recent Labs Lab 08/09/15 1050  LIPASE 20   No results for input(s): AMMONIA in the last 168 hours. CBC:  Recent Labs Lab 08/09/15 1050  WBC 14.6*  HGB 14.2  HCT 45.8  MCV 86.9  PLT 212   Cardiac Enzymes:  Recent Labs Lab 08/09/15 1838 08/09/15 2328 08/10/15 0510  TROPONINI <0.03 <0.03 <0.03   BNP (last 3 results)  Recent Labs  04/17/15 1043 04/30/15 1120 08/09/15 1050  BNP 77.7 46.4 130.8*    ProBNP (last 3 results) No results for input(s): PROBNP in the last 8760 hours.  CBG:  Recent Labs Lab 08/09/15 2045 08/09/15 2324 08/10/15 0414  GLUCAP 155* 185* 268*    Recent Results (from the past 240 hour(s))  Blood Culture (routine x 2)     Status: None (Preliminary result)   Collection Time: 08/09/15 10:50 AM  Result Value Ref Range Status   Specimen Description BLOOD LEFT HAND  Final   Special Requests   Final    BOTTLES DRAWN AEROBIC AND ANAEROBIC 5CC Performed at [facility]    Culture PENDING  Incomplete   Report Status PENDING  Incomplete  MRSA PCR Screening     Status: None   Collection Time: 08/09/15  6:08 PM  Result Value Ref Range Status   MRSA by PCR NEGATIVE NEGATIVE Final    Comment:        The GeneXpert MRSA Assay (FDA approved for NASAL specimens only), is one component of a comprehensive MRSA colonization surveillance program. It is not intended to diagnose MRSA infection nor to guide or monitor treatment for MRSA  infections.      Studies: Dg Chest 2 View  08/09/2015  CLINICAL DATA:  Generalized weakness, fever EXAM: CHEST  2 VIEW COMPARISON:  04/30/2015 FINDINGS: Borderline cardiomegaly. No infiltrate or pulmonary edema. Bony thorax is unremarkable. IMPRESSION: No active cardiopulmonary disease.  Borderline cardiomegaly. Electronically Signed   By: Natasha Mead M.D.   On: 08/09/2015 11:41   Ct Head Wo Contrast  08/09/2015  CLINICAL DATA:  Altered mental status, headache, recent pneumonia, hypertension, quadriplegia, history CHF EXAM: CT HEAD WITHOUT CONTRAST TECHNIQUE: Contiguous axial images were obtained from the base of the skull through the vertex without intravenous contrast. COMPARISON:  04/17/2015 FINDINGS: Mild generalized atrophy. Normal ventricular morphology. No midline shift or mass effect. Otherwise normal appearance of brain parenchyma. No intracranial hemorrhage, mass lesion or evidence acute infarction. No extra-axial fluid collection. Sinuses clear and bones unremarkable. Atherosclerotic calcifications at the carotid siphons. IMPRESSION: No acute intracranial abnormalities. Electronically Signed   By: Angelyn Punt.D.  On: 08/09/2015 17:32   Ct Abdomen Pelvis W Contrast  08/09/2015  CLINICAL DATA:  70 year old long-time paraplegic due to remote spinal cord injury, presenting with acute onset of generalized abdominal pain and nausea which began yesterday. Patient diagnosed with pneumonia approximately 2 weeks ago for which she is completing antibiotic therapy. EXAM: CT ABDOMEN AND PELVIS WITH CONTRAST TECHNIQUE: Multidetector CT imaging of the abdomen and pelvis was performed using the standard protocol following bolus administration of intravenous contrast. CONTRAST:  OMNIPAQUE IOHEXOL 300 MG/ML IV. Oral contrast was also administered. COMPARISON:  04/24/2015, 03/18/2007. FINDINGS: Respiratory motion blurs images of the lower chest and upper abdomen. The study appears diagnostic, however. Lower  chest: Linear scarring in the lower lobes, unchanged. Visualized lung bases otherwise clear. The 4 mm left lower lobe nodule identified on the October, 2016 examination was not imaged today. Heart mildly to moderately enlarged with a very large amount of epicardial fat as noted previously. Hepatobiliary: Mild hepatomegaly with diffuse hepatic steatosis with focal sparing surrounding the gallbladder and scattered areas of focal sparing peripherally in both lobes, less severe than on the October, 2016 examination. No significant focal hepatic parenchymal abnormality. Gallbladder normal in appearance without calcified gallstones. No biliary ductal dilation. Pancreas: Normal in appearance without evidence of mass, ductal dilation, or inflammation. Spleen: Normal in size and appearance. Adrenals/Urinary Tract: Normal appearing adrenal glands. Numerous cortical cysts involving both kidneys, the largest arising from the lower pole of the left kidney measuring approximately 4.5 cm. All mild diffuse cortical thinning involving both kidneys as noted previously. Atherosclerotic calcification involving the distal right main renal artery at the right renal hilus. No hydronephrosis. No urinary tract calculi. Urinary bladder unremarkable. Stomach/Bowel: Stomach normal in appearance for the degree of distention. Normal-appearing small bowel. Descending and sigmoid colon diverticulosis without evidence of acute diverticulitis. Moderate colonic stool burden. Surgical clips at the cecum from prior appendectomy. 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 Visceral arteries patent though atherosclerotic. No pathologic lymphadenopathy. Reproductive: Uterus and ovaries surgically absent. No adnexal masses. Other: Atrophic paraspinous muscles and visualized lower extremity muscles indicating long-standing tissues. Musculoskeletal: Osseous demineralization. Degenerative disc disease at L3-4 L4-5.  Degenerative retrolisthesis of L4 relative to L5. Severe diffuse facet degenerative changes involving the lumbar spine. No acute abnormality. IMPRESSION: 1. No acute abnormalities involving the abdomen or pelvis. 2. Descending and sigmoid colon diverticulosis without evidence of acute diverticulitis. 3. Mild hepatomegaly with diffuse hepatic steatosis. Focal sparing surrounding the gallbladder and peripherally in both hepatic lobes. No significant focal hepatic parenchymal abnormality. Steatosis is less severe than on the October, 2016 CT. 4. 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 Electronically Signed   By: Hulan Saas M.D.   On: 08/09/2015 13:32    Scheduled Meds: . antiseptic oral rinse  7 mL Mouth Rinse q12n4p  . chlorhexidine  15 mL Mouth Rinse BID  . enoxaparin (LOVENOX) injection  90 mg Subcutaneous Q12H  . insulin aspart  0-9 Units Subcutaneous Q4H  . ipratropium  0.5 mg Nebulization Once  . levalbuterol  1.25 mg Nebulization Once  . methylPREDNISolone (SOLU-MEDROL) injection  60 mg Intravenous 3 times per day  . pantoprazole  40 mg Oral Daily  . piperacillin-tazobactam (ZOSYN)  IV  3.375 g Intravenous Q8H  . saccharomyces boulardii  250 mg Oral BID  . sodium chloride flush  3 mL Intravenous Q12H  . tamsulosin  0.4 mg Oral QHS  . vancomycin  1,000 mg Intravenous Q12H   Continuous Infusions: . sodium chloride 100  mL/hr at 08/09/15 2301     Time spent: 25 minutes    Juanjesus Pepperman  [facility] Pager [phone]. If 7PM-7AM, please contact night-coverage at [email or web address], password [email or web address] 08/10/2015, 8:40 AM  LOS: 1 day

## 2015-08-10 NOTE — Progress Notes (Signed)
CRITICAL VALUE ALERT  Critical value received:  Lactic acid 2.1  Date of notification:  08/10/2015  Time of notification:  0004  Critical value read back:Yes.    Nurse who received alert:  Effie Berkshire  MD notified (1st page): L Harduk   Time of first page:  0012  MD notified (2nd page):  Time of second page:  Responding MD:  L Harduk  Time MD responded:  0030

## 2015-08-10 NOTE — Progress Notes (Signed)
Nutrition Brief Note  Patient identified on the Malnutrition Screening Tool (MST) Report  Wt Readings from Last 15 Encounters:  08/10/15 205 lb 14.6 oz (93.4 kg)  05/03/15 206 lb 11.2 oz (93.759 kg)  04/23/15 194 lb 8 oz (88.225 kg)  04/17/15 185 lb 9.6 oz (84.188 kg)  01/28/15 188 lb 15.8 oz (85.724 kg)  01/19/15 175 lb 7.8 oz (79.6 kg)  08/27/14 168 lb 6.9 oz (76.4 kg)  04/18/14 160 lb (72.576 kg)  03/29/14 171 lb 4.8 oz (77.7 kg)  03/14/14 165 lb 2 oz (74.9 kg)  06/10/13 173 lb (78.472 kg)    Body mass index is 37.65 kg/(m^2). Patient meets criteria for obese class II based on current BMI.   Pt endorses regular diet at home, eats well, no acute concerns. Per MD note, UTI is likely source of her Sepsis.   Current diet order is NPO, patient is consuming approximately N/A% of meals at this time. Labs and medications reviewed.   No nutrition interventions warranted at this time. If nutrition issues arise, please consult RD.   Dionne Ano. Demetria Iwai, MS, RD LDN After Hours/Weekend Pager [phone]

## 2015-08-10 NOTE — Progress Notes (Signed)
Inpatient Diabetes Program Recommendations  AACE/ADA: New Consensus Statement on Inpatient Glycemic Control (2015)  Target Ranges:  Prepandial:   less than 140 mg/dL      Peak postprandial:   less than 180 mg/dL (1-2 hours)      Critically ill patients:  140 - 180 mg/dL   Review of Glycemic Control  Diabetes history: DM2 Outpatient Diabetes medications: Amaryl 3 mg QAM Current orders for Inpatient glycemic control: Novolog sensitive Q4H  Results for HADIA, MINIER (MRN [id number]) as of 08/10/2015 14:20  Ref. Range 08/09/2015 23:24 08/10/2015 04:14 08/10/2015 08:54 08/10/2015 11:16  Glucose-Capillary Latest Ref Range: 65-99 mg/dL 409 (H) 811 (H) 914 (H) 257 (H)   Inpatient Diabetes Program Recommendations:     Increase Novolog to resistant tidwc and hs Add Levemir 10 units QHS  Will follow while inpatient. Thank you. Ailene Ards, RD, LDN, CDE Inpatient Diabetes Coordinator [phone]

## 2015-08-10 NOTE — Procedures (Signed)
BiPAP is not needed at this time.  Pt is resting comfortably on room air without any distress, HR-104, RR-17, SpO2-96.  RT will continue to monitor pt and will place if needed or requested.

## 2015-08-11 ENCOUNTER — Inpatient Hospital Stay (HOSPITAL_COMMUNITY): Payer: Medicare Other

## 2015-08-11 DIAGNOSIS — R197 Diarrhea, unspecified: Secondary | ICD-10-CM | POA: Diagnosis not present

## 2015-08-11 LAB — CBC WITH DIFFERENTIAL/PLATELET
BASOS ABS: 0.1 10*3/uL (ref 0.0–0.1)
Basophils Relative: 1 %
EOS ABS: 0 10*3/uL (ref 0.0–0.7)
EOS PCT: 0 %
HCT: 41 % (ref 36.0–46.0)
Hemoglobin: 12.8 g/dL (ref 12.0–15.0)
Lymphocytes Relative: 8 %
Lymphs Abs: 0.9 10*3/uL (ref 0.7–4.0)
MCH: 27.4 pg (ref 26.0–34.0)
MCHC: 31.2 g/dL (ref 30.0–36.0)
MCV: 87.6 fL (ref 78.0–100.0)
MONO ABS: 0.5 10*3/uL (ref 0.1–1.0)
Monocytes Relative: 4 %
NEUTROS PCT: 87 %
Neutro Abs: 9.8 10*3/uL — ABNORMAL HIGH (ref 1.7–7.7)
PLATELETS: 238 10*3/uL (ref 150–400)
RBC: 4.68 MIL/uL (ref 3.87–5.11)
RDW: 14.8 % (ref 11.5–15.5)
WBC: 11.3 10*3/uL — AB (ref 4.0–10.5)

## 2015-08-11 LAB — COMPREHENSIVE METABOLIC PANEL
ALBUMIN: 3.5 g/dL (ref 3.5–5.0)
ALT: 35 U/L (ref 14–54)
AST: 51 U/L — AB (ref 15–41)
Alkaline Phosphatase: 44 U/L (ref 38–126)
Anion gap: 10 (ref 5–15)
BUN: 10 mg/dL (ref 6–20)
CHLORIDE: 101 mmol/L (ref 101–111)
CO2: 27 mmol/L (ref 22–32)
CREATININE: 0.69 mg/dL (ref 0.44–1.00)
Calcium: 8.6 mg/dL — ABNORMAL LOW (ref 8.9–10.3)
GFR calc Af Amer: >60 mL/min (ref >60)
GFR calc non Af Amer: >60 mL/min (ref >60)
Glucose, Bld: 312 mg/dL — ABNORMAL HIGH (ref 65–99)
Potassium: 4.8 mmol/L (ref 3.5–5.1)
SODIUM: 138 mmol/L (ref 135–145)
Total Bilirubin: 1 mg/dL (ref 0.3–1.2)
Total Protein: 6.6 g/dL (ref 6.5–8.1)

## 2015-08-11 LAB — HEMOGLOBIN A1C
Hgb A1c MFr Bld: 9.3 % — ABNORMAL HIGH (ref 4.8–5.6)
Mean Plasma Glucose: 220 mg/dL

## 2015-08-11 LAB — GLUCOSE, CAPILLARY
GLUCOSE-CAPILLARY: 298 mg/dL — AB (ref 65–99)
GLUCOSE-CAPILLARY: 239 mg/dL — AB (ref 65–99)
GLUCOSE-CAPILLARY: 216 mg/dL — AB (ref 65–99)
Glucose-Capillary: 252 mg/dL — ABNORMAL HIGH (ref 65–99)

## 2015-08-11 LAB — LACTIC ACID, PLASMA
Lactic Acid, Venous: 3.7 mmol/L (ref 0.5–2.0)
Lactic Acid, Venous: 3.9 mmol/L (ref 0.5–2.0)

## 2015-08-11 MED ORDER — CALCIUM CARBONATE-VITAMIN D 500-200 MG-UNIT PO TABS
1.0000 | ORAL_TABLET | Freq: Every day | ORAL | Status: DC
  Administered 2015-08-12 – 2015-08-16 (×5): 1 via ORAL
  Filled 2015-08-11 (×5): qty 1

## 2015-08-11 MED ORDER — SODIUM CHLORIDE 0.9 % IV SOLN
INTRAVENOUS | Status: DC
  Administered 2015-08-12 – 2015-08-13 (×2): via INTRAVENOUS

## 2015-08-11 MED ORDER — MORPHINE SULFATE ER 15 MG PO TBCR: 15.0000 mg | EXTENDED_RELEASE_TABLET | Freq: Two times a day (BID) | ORAL | Status: DC

## 2015-08-11 MED ORDER — PROPRANOLOL HCL 10 MG PO TABS
20.0000 mg | ORAL_TABLET | Freq: Two times a day (BID) | ORAL | Status: DC
  Administered 2015-08-11 – 2015-08-14 (×7): 20 mg via ORAL
  Filled 2015-08-11: qty 2
  Filled 2015-08-11: qty 1
  Filled 2015-08-11: qty 2
  Filled 2015-08-11 (×2): qty 1
  Filled 2015-08-11 (×2): qty 2
  Filled 2015-08-11: qty 1

## 2015-08-11 MED ORDER — MORPHINE SULFATE ER 15 MG PO TBCR
15.0000 mg | EXTENDED_RELEASE_TABLET | Freq: Two times a day (BID) | ORAL | Status: DC
  Administered 2015-08-11: 15 mg via ORAL
  Filled 2015-08-11: qty 1

## 2015-08-11 MED ORDER — ENOXAPARIN SODIUM 40 MG/0.4ML ~~LOC~~ SOLN
40.0000 mg | SUBCUTANEOUS | Status: DC
  Administered 2015-08-12 – 2015-08-14 (×3): 40 mg via SUBCUTANEOUS
  Filled 2015-08-11 (×3): qty 0.4

## 2015-08-11 MED ORDER — LOPERAMIDE HCL 2 MG PO CAPS
4.0000 mg | ORAL_CAPSULE | Freq: Once | ORAL | Status: AC
  Administered 2015-08-11: 4 mg via ORAL
  Filled 2015-08-11: qty 2

## 2015-08-11 MED ORDER — INSULIN GLARGINE 100 UNIT/ML ~~LOC~~ SOLN
15.0000 [IU] | Freq: Every day | SUBCUTANEOUS | Status: DC
  Administered 2015-08-11: 15 [IU] via SUBCUTANEOUS
  Filled 2015-08-11 (×2): qty 0.15

## 2015-08-11 MED ORDER — ZOLPIDEM TARTRATE 5 MG PO TABS
5.0000 mg | ORAL_TABLET | Freq: Every evening | ORAL | Status: DC | PRN
  Administered 2015-08-11 – 2015-08-15 (×5): 5 mg via ORAL
  Filled 2015-08-11 (×5): qty 1

## 2015-08-11 MED ORDER — ASPIRIN EC 81 MG PO TBEC
81.0000 mg | DELAYED_RELEASE_TABLET | Freq: Every morning | ORAL | Status: DC
  Administered 2015-08-11 – 2015-08-15 (×5): 81 mg via ORAL
  Filled 2015-08-11 (×5): qty 1

## 2015-08-11 MED ORDER — METHYLPREDNISOLONE SODIUM SUCC 40 MG IJ SOLR
40.0000 mg | Freq: Three times a day (TID) | INTRAMUSCULAR | Status: DC
  Administered 2015-08-11 – 2015-08-15 (×13): 40 mg via INTRAVENOUS
  Filled 2015-08-11 (×14): qty 1

## 2015-08-11 MED ORDER — IOHEXOL 350 MG/ML SOLN
100.0000 mL | Freq: Once | INTRAVENOUS | Status: AC | PRN
  Administered 2015-08-11: 100 mL via INTRAVENOUS

## 2015-08-11 MED ORDER — LOPERAMIDE HCL 2 MG PO CAPS
2.0000 mg | ORAL_CAPSULE | ORAL | Status: DC | PRN
  Administered 2015-08-12: 2 mg via ORAL
  Filled 2015-08-11: qty 1

## 2015-08-11 MED ORDER — GABAPENTIN 300 MG PO CAPS: 300.0000 mg | ORAL_CAPSULE | Freq: Three times a day (TID) | ORAL | Status: DC

## 2015-08-11 MED ORDER — BACLOFEN 10 MG PO TABS
10.0000 mg | ORAL_TABLET | Freq: Two times a day (BID) | ORAL | Status: DC
  Administered 2015-08-11 (×2): 10 mg via ORAL
  Filled 2015-08-11 (×2): qty 1

## 2015-08-11 MED ORDER — HYDRALAZINE HCL 20 MG/ML IJ SOLN
10.0000 mg | Freq: Four times a day (QID) | INTRAMUSCULAR | Status: DC | PRN
  Administered 2015-08-11 – 2015-08-14 (×6): 10 mg via INTRAVENOUS
  Filled 2015-08-11 (×7): qty 1

## 2015-08-11 NOTE — Plan of Care (Signed)
Problem: Tissue Perfusion: Goal: Risk factors for ineffective tissue perfusion will decrease Outcome: Completed/Met Date Met:  08/11/15 Patient receiving subcutaneous Lovenox

## 2015-08-11 NOTE — Progress Notes (Signed)
Pt's face has become increasingly flushed.  Pt also seems to have a slight increase in work of breathing.  She is satting 97% on room air.  MD is aware of these assessment findings.   Will continue to monitor.

## 2015-08-11 NOTE — Progress Notes (Signed)
Pt has refused repositioning.  Education was provided about the importance of moving from one side to the other to prevent skin breakdown.  She states she is aware of the risks.  She also states being on one side or the other makes it harder to breathe.  Will continue to monitor.

## 2015-08-11 NOTE — Progress Notes (Signed)
CRITICAL VALUE ALERT  Critical value received:  Lactic Acid 3.9  Date of notification:  08/11/2015  Time of notification:  1458  Critical value read back:Yes.    Nurse who received alert:  Sharl Ma, RN  MD notified (1st page):  Ranga  Time of first page:  1502  Time of second page: 1522  Responding MD:  Venetia Constable  Time MD responded:  1525

## 2015-08-11 NOTE — Progress Notes (Signed)
PT Cancellation Note  Patient Details Name: [name] [name] MRN: [id number] DOB: 1946-05-07   Cancelled Treatment:    Reason Eval/Treat Not Completed: Medical issues which prohibited therapy (noted hypertension, diastiolic > 100 this day. will check back tomorrow.)   Sharen Heck PT [phone]  08/11/2015, 1:54 PM

## 2015-08-11 NOTE — Significant Event (Addendum)
Okay to keep patient stepdown unit today as she has dyspnea well as lactic acidosis. Will increase fluids to 100 cc/hr, and place Foley catheter as incontinent.

## 2015-08-11 NOTE — Progress Notes (Signed)
[name] [name] ZHY:[id number] DOB: Mar 08, 1946 DOA: 08/09/2015 PCP: [name], MD  Assessment at time of admission by Dr Susie Cassette on 08/09/15 70 y.o. 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 Who came to the emergency department brought in via EMS with complaints of generalized abdominal pain, nausea, received 4 mg of Zofran by EMS, apparently has been constipated for 1 week, intermittent nausea but no vomiting. Patient son who provides most of the history states that she was on antibiotics 2 weeks ago for pneumonia, patient's oxygen saturation on room air was found to be 88%. She was also found to have a fever of 101.2, patient is somnolent and arousable to sternal lab but unable to provide any meaningful history. Upon exam the patient was found to be confused, 99% on 4 L of oxygen, ER initiated code sepsis with vancomycin, Zosyn, cultures, and small IV fluid bolus because of the patient's history of CHF. Lab work showed a lactate of 3.5, WBC 14.6, venous pH 7.35 with CO2 63, BNP 130. Chest x-ray negative acute. Based on reassuring chest x-ray and BNP, gave more fluid to total 30 mg/kg. attained CT to rule out acute intra-abdominal process. CT without acute findings to explain the patient's fever. UA indicates infection and I suspect a UTI is the source of the patient's sepsis, PCCM was called and , Dr. Kendrick Fries thought that the patient was appropriate for stepdown admission. Summary&Daily Progress Notes since admission 08/10/15: I saw and examined [name] [name] at bedside, reviewed her chart and discussed with nursing. I also called her son [name] and left a message. Appreciate PCCM. Patient required BIPAP support overnight and remains confused,, baseline mentation not clear at present. There is possibility of  aspiration/CAP not evident on initial CXR because of dehydration. Will repeat chest x-ray since patient is now hydrated, obtain CBC/CMP/magnesium/phosphorus/lactic acid/abg, add Zithromax to cover atypical organisms for possible pneumonia, d/c V/Q scan, continue therapeutic doses of Lovenox for now and consider CTA chest in am depending on clinical progress. 08/11/15: Off BIPAP. White count improved to 11.200. Septic work up remains negative. Fever subsided. Has diarrhea which is probably antibiotic induced. Will obtain CTA chest, d/c Vancomycin, plan quick steroid taper, Increase Lantus gradually to optimize DM control, give Imodium, discontinue normal saline, mobilize patient and transfer to med surg when bed becomes available. Problem List Plan  Principal Problem:   Sepsis (HCC) Active Problems:   UTI (lower urinary tract infection)   Metabolic encephalopathy   Diabetes (HCC)   Chronic pain disorder   Hypertension   Quadriplegia (HCC)   Chronic diastolic (congestive) heart failure (HCC)   Lactic acidosis   Acute respiratory failure with hypoxia and hypercarbia (HCC)   Diarrhea   D/c Vancomycin  Day 3 Zosyn  Day 2 Zithromax  CTA chest with contrast to rule out PE  Change Lovenox to prophylactic dose if no PE  Change Lantus to 15 units daily  Reduce dose of Solumedrol to   Imodium  Discontinue normal saline   Physical therapy evaluation for mobilization  Transfer to med surg when bed becomes available  Code Status: Full Code Family Communication: Called patient's son [name] at [phone] and left message to call back Disposition Plan: To be determined. Keep in SDU today Consultants:  None Procedures:  On BIPAP Antibiotics:  Vancomycin 08/09/15>>08/11/15  Zosyn 08/09/15>>  Zithromax 08/10/15>>  HPI/Subjective: Feels better but "that antibiotic is causing me diarrhea, and the water pill helps".  Objective: Filed Vitals:   08/11/15 0600 08/11/15 0700  BP:  121/102   Pulse:    Temp:    Resp: 21 17    Intake/Output Summary (Last 24 hours) at 08/11/15 0823 Last data filed at 08/11/15 0700  Gross per 24 hour  Intake   3140 ml  Output      0 ml  Net   3140 ml   Filed Weights   08/09/15 1053 08/09/15 1800 08/10/15 0400  Weight: 90.719 kg (200 lb) 92.3 kg (203 lb 7.8 oz) 93.4 kg (205 lb 14.6 oz)    Exam:   General:  Comfortable at rest.  Cardiovascular: S1-S2 normal. No murmurs. Pulse regular.  Respiratory: Good air entry bilaterally. No rhonchi or rales.  Abdomen: Soft and nontender. Normal bowel sounds. No organomegaly.  Musculoskeletal: No pedal edema   Neurological: Intact  Data Reviewed: Basic Metabolic Panel:  Recent Labs Lab 08/09/15 1050 08/10/15 0930 08/11/15 0402  NA 139 139 138  K 4.4 4.8 4.8  CL 95* 102 101  CO2 33* 27 27  GLUCOSE 264* 310* 312*  BUN 13 12 10   CREATININE 0.72 0.72 0.69  CALCIUM 8.9 8.2* 8.6*  MG  --  2.1  --   PHOS  --  3.1  --    Liver Function Tests:  Recent Labs Lab 08/09/15 1050 08/09/15 1838 08/10/15 0930 08/11/15 0402  AST 31 28 27  51*  ALT 32 34 31 35  ALKPHOS 51 50 47 44  BILITOT 1.0 0.9 0.9 1.0  PROT 6.5 6.5 6.3* 6.6  ALBUMIN 3.6 3.6 3.3* 3.5    Recent Labs Lab 08/09/15 1050  LIPASE 20   No results for input(s): AMMONIA in the last 168 hours. CBC:  Recent Labs Lab 08/09/15 1050 08/10/15 0930 08/11/15 0402  WBC 14.6* 13.2* 11.3*  NEUTROABS  --  12.1* 9.8*  HGB 14.2 13.3 12.8  HCT 45.8 42.3 41.0  MCV 86.9 87.4 87.6  PLT 212 198 238   Cardiac Enzymes:  Recent Labs Lab 08/09/15 1838 08/09/15 2328 08/10/15 0510  TROPONINI <0.03 <0.03 <0.03   BNP (last 3 results)  Recent Labs  04/17/15 1043 04/30/15 1120 08/09/15 1050  BNP 77.7 46.4 130.8*    ProBNP (last 3 results) No results for input(s): PROBNP in the last 8760 hours.  CBG:  Recent Labs Lab 08/10/15 0854 08/10/15 1116 08/10/15 1556 08/10/15 2146 08/11/15 0743  GLUCAP 280*  257* 324* 289* 252*    Recent Results (from the past 240 hour(s))  Blood Culture (routine x 2)     Status: None (Preliminary result)   Collection Time: 08/09/15 10:50 AM  Result Value Ref Range Status   Specimen Description BLOOD LEFT HAND  Final   Special Requests BOTTLES DRAWN AEROBIC AND ANAEROBIC 5CC  Final   Culture   Final    NO GROWTH 1 DAY Performed at [facility]    Report Status PENDING  Incomplete  Urine culture     Status: None   Collection Time: 08/09/15  2:11 PM  Result Value Ref Range Status   Specimen Description URINE, CATHETERIZED  Final   Special Requests NONE  Final   Culture   Final    NO GROWTH 1 DAY Performed at [facility]    Report Status 08/10/2015 FINAL  Final  MRSA PCR Screening     Status: None   Collection Time:  08/09/15  6:08 PM  Result Value Ref Range Status   MRSA by PCR NEGATIVE NEGATIVE Final    Comment:        The GeneXpert MRSA Assay (FDA approved for NASAL specimens only), is one component of a comprehensive MRSA colonization surveillance program. It is not intended to diagnose MRSA infection nor to guide or monitor treatment for MRSA infections.      Studies: Dg Chest 2 View  08/09/2015  CLINICAL DATA:  Generalized weakness, fever EXAM: CHEST  2 VIEW COMPARISON:  04/30/2015 FINDINGS: Borderline cardiomegaly. No infiltrate or pulmonary edema. Bony thorax is unremarkable. IMPRESSION: No active cardiopulmonary disease.  Borderline cardiomegaly. Electronically Signed   By: Natasha Mead M.D.   On: 08/09/2015 11:41   Ct Head Wo Contrast  08/09/2015  CLINICAL DATA:  Altered mental status, headache, recent pneumonia, hypertension, quadriplegia, history CHF EXAM: CT HEAD WITHOUT CONTRAST TECHNIQUE: Contiguous axial images were obtained from the base of the skull through the vertex without intravenous contrast. COMPARISON:  04/17/2015 FINDINGS: Mild generalized atrophy. Normal ventricular morphology. No midline shift or mass  effect. Otherwise normal appearance of brain parenchyma. No intracranial hemorrhage, mass lesion or evidence acute infarction. No extra-axial fluid collection. Sinuses clear and bones unremarkable. Atherosclerotic calcifications at the carotid siphons. IMPRESSION: No acute intracranial abnormalities. Electronically Signed   By: Ulyses Southward M.D.   On: 08/09/2015 17:32   Ct Abdomen Pelvis W Contrast  08/09/2015  CLINICAL DATA:  70 year old long-time paraplegic due to remote spinal cord injury, presenting with acute onset of generalized abdominal pain and nausea which began yesterday. Patient diagnosed with pneumonia approximately 2 weeks ago for which she is completing antibiotic therapy. EXAM: CT ABDOMEN AND PELVIS WITH CONTRAST TECHNIQUE: Multidetector CT imaging of the abdomen and pelvis was performed using the standard protocol following bolus administration of intravenous contrast. CONTRAST:  OMNIPAQUE IOHEXOL 300 MG/ML IV. Oral contrast was also administered. COMPARISON:  04/24/2015, 03/18/2007. FINDINGS: Respiratory motion blurs images of the lower chest and upper abdomen. The study appears diagnostic, however. Lower chest: Linear scarring in the lower lobes, unchanged. Visualized lung bases otherwise clear. The 4 mm left lower lobe nodule identified on the October, 2016 examination was not imaged today. Heart mildly to moderately enlarged with a very large amount of epicardial fat as noted previously. Hepatobiliary: Mild hepatomegaly with diffuse hepatic steatosis with focal sparing surrounding the gallbladder and scattered areas of focal sparing peripherally in both lobes, less severe than on the October, 2016 examination. No significant focal hepatic parenchymal abnormality. Gallbladder normal in appearance without calcified gallstones. No biliary ductal dilation. Pancreas: Normal in appearance without evidence of mass, ductal dilation, or inflammation. Spleen: Normal in size and appearance.  Adrenals/Urinary Tract: Normal appearing adrenal glands. Numerous cortical cysts involving both kidneys, the largest arising from the lower pole of the left kidney measuring approximately 4.5 cm. All mild diffuse cortical thinning involving both kidneys as noted previously. Atherosclerotic calcification involving the distal right main renal artery at the right renal hilus. No hydronephrosis. No urinary tract calculi. Urinary bladder unremarkable. Stomach/Bowel: Stomach normal in appearance for the degree of distention. Normal-appearing small bowel. Descending and sigmoid colon diverticulosis without evidence of acute diverticulitis. Moderate colonic stool burden. Surgical clips at the cecum from prior appendectomy. 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 Visceral arteries patent though atherosclerotic. No pathologic lymphadenopathy. Reproductive: Uterus and ovaries surgically absent. No adnexal masses. Other: Atrophic paraspinous muscles and visualized lower extremity muscles indicating long-standing tissues. Musculoskeletal: Osseous demineralization. Degenerative disc  disease at L3-4 L4-5. Degenerative retrolisthesis of L4 relative to L5. Severe diffuse facet degenerative changes involving the lumbar spine. No acute abnormality. IMPRESSION: 1. No acute abnormalities involving the abdomen or pelvis. 2. Descending and sigmoid colon diverticulosis without evidence of acute diverticulitis. 3. Mild hepatomegaly with diffuse hepatic steatosis. Focal sparing surrounding the gallbladder and peripherally in both hepatic lobes. No significant focal hepatic parenchymal abnormality. Steatosis is less severe than on the October, 2016 CT. 4. 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 Electronically Signed   By: Hulan Saas M.D.   On: 08/09/2015 13:32   Dg Chest Port 1 View  08/10/2015  CLINICAL DATA:  Shortness of Breath EXAM: PORTABLE CHEST 1 VIEW  COMPARISON:  08/09/2015 FINDINGS: Cardiac shadow is mildly enlarged. The lungs are well aerated bilaterally. No focal infiltrate or sizable effusion is seen. No bony abnormality is noted. IMPRESSION: No active disease. Electronically Signed   By: Alcide Clever M.D.   On: 08/10/2015 09:10    Scheduled Meds: . antiseptic oral rinse  7 mL Mouth Rinse q12n4p  . azithromycin  500 mg Intravenous Q24H  . chlorhexidine  15 mL Mouth Rinse BID  . enoxaparin (LOVENOX) injection  90 mg Subcutaneous Q12H  . insulin aspart  0-15 Units Subcutaneous TID WC  . insulin aspart  0-5 Units Subcutaneous QHS  . insulin glargine  15 Units Subcutaneous Daily  . ipratropium  0.5 mg Nebulization Once  . levalbuterol  1.25 mg Nebulization Once  . loperamide  4 mg Oral Once  . methylPREDNISolone (SOLU-MEDROL) injection  40 mg Intravenous 3 times per day  . pantoprazole  40 mg Oral Daily  . piperacillin-tazobactam (ZOSYN)  IV  3.375 g Intravenous Q8H  . saccharomyces boulardii  250 mg Oral BID  . sodium chloride flush  3 mL Intravenous Q12H  . tamsulosin  0.4 mg Oral QHS   Continuous Infusions:      Trayce Maino  [facility] Pager [phone]. If 7PM-7AM, please contact night-coverage at [email or web address], password [facility] 08/11/2015, 8:23 AM  LOS: 2 days

## 2015-08-11 NOTE — Significant Event (Addendum)
CTA chest does not suggest PE, therefore will change Lovenox to therapeutic dose, "No acute chest abnormality. Negative for a pulmonary embolism. Noevidence for an aortic dissection.Small pulmonary nodules as described. No significantly changed since 04/17/2015. At least 1 of these nodules may represent a calcified granuloma. Largest nodule measures up to 5 mm. If the patient is at high risk for bronchogenic carcinoma, follow-up chest CT at 6-12 months is recommended. If the patient is at low risk for bronchogenic carcinoma, follow-up chest CT at 12 months is recommended. This recommendation follows the consensus statement: Guidelines for Management of Small Pulmonary Nodules Detected on CT Scans: A Statement from the Fleischner Society as published in Radiology 2005;237:395-400".

## 2015-08-11 NOTE — Progress Notes (Signed)
CRITICAL VALUE ALERT  Critical value received: Lactic Acid 3.7  Date of notification: 08/11/2015  Time of notification:  1144  Critical value read back:Yes.    Nurse who received alert:  Sharl Ma  MD notified (1st page): Ranga  Time of first page: 1145  Time MD responded:  1148

## 2015-08-12 LAB — CK TOTAL AND CKMB (NOT AT ARMC)
CK TOTAL: 86 U/L (ref 38–234)
CK TOTAL: 36 U/L — AB (ref 38–234)
CK, MB: 4.6 ng/mL (ref 0.5–5.0)
CK, MB: 3 ng/mL (ref 0.5–5.0)
RELATIVE INDEX: INVALID (ref 0.0–2.5)
Relative Index: INVALID (ref 0.0–2.5)

## 2015-08-12 LAB — CBC WITH DIFFERENTIAL/PLATELET
BASOS ABS: 0 10*3/uL (ref 0.0–0.1)
BASOS PCT: 0 %
EOS ABS: 0 10*3/uL (ref 0.0–0.7)
Eosinophils Relative: 0 %
HEMATOCRIT: 40.7 % (ref 36.0–46.0)
HEMOGLOBIN: 12.8 g/dL (ref 12.0–15.0)
Lymphocytes Relative: 8 %
Lymphs Abs: 0.8 10*3/uL (ref 0.7–4.0)
MCH: 27.2 pg (ref 26.0–34.0)
MCHC: 31.4 g/dL (ref 30.0–36.0)
MCV: 86.4 fL (ref 78.0–100.0)
Monocytes Absolute: 0.6 10*3/uL (ref 0.1–1.0)
Monocytes Relative: 6 %
NEUTROS ABS: 7.9 10*3/uL — AB (ref 1.7–7.7)
NEUTROS PCT: 86 %
Platelets: 246 10*3/uL (ref 150–400)
RBC: 4.71 MIL/uL (ref 3.87–5.11)
RDW: 15.2 % (ref 11.5–15.5)
WBC: 9.2 10*3/uL (ref 4.0–10.5)

## 2015-08-12 LAB — GLUCOSE, CAPILLARY
GLUCOSE-CAPILLARY: 233 mg/dL — AB (ref 65–99)
GLUCOSE-CAPILLARY: 251 mg/dL — AB (ref 65–99)
GLUCOSE-CAPILLARY: 140 mg/dL — AB (ref 65–99)

## 2015-08-12 LAB — BASIC METABOLIC PANEL
ANION GAP: 9 (ref 5–15)
BUN: 12 mg/dL (ref 6–20)
CALCIUM: 8.4 mg/dL — AB (ref 8.9–10.3)
CO2: 25 mmol/L (ref 22–32)
CREATININE: 0.55 mg/dL (ref 0.44–1.00)
Chloride: 104 mmol/L (ref 101–111)
GFR calc non Af Amer: >60 mL/min (ref >60)
Glucose, Bld: 251 mg/dL — ABNORMAL HIGH (ref 65–99)
Potassium: 3.8 mmol/L (ref 3.5–5.1)
SODIUM: 138 mmol/L (ref 135–145)

## 2015-08-12 LAB — TROPONIN I

## 2015-08-12 LAB — LACTIC ACID, PLASMA: Lactic Acid, Venous: 1.8 mmol/L (ref 0.5–2.0)

## 2015-08-12 MED ORDER — INSULIN GLARGINE 100 UNIT/ML ~~LOC~~ SOLN
20.0000 [IU] | Freq: Every day | SUBCUTANEOUS | Status: DC
  Administered 2015-08-12 – 2015-08-13 (×2): 20 [IU] via SUBCUTANEOUS
  Filled 2015-08-12 (×3): qty 0.2

## 2015-08-12 MED ORDER — FUROSEMIDE 10 MG/ML IJ SOLN
20.0000 mg | Freq: Once | INTRAMUSCULAR | Status: AC
  Administered 2015-08-12: 20 mg via INTRAVENOUS
  Filled 2015-08-12: qty 2

## 2015-08-12 MED ORDER — MORPHINE SULFATE ER 15 MG PO TBCR
15.0000 mg | EXTENDED_RELEASE_TABLET | Freq: Two times a day (BID) | ORAL | Status: DC
  Administered 2015-08-13 – 2015-08-16 (×7): 15 mg via ORAL
  Filled 2015-08-12 (×7): qty 1

## 2015-08-12 MED ORDER — GABAPENTIN 100 MG PO CAPS
100.0000 mg | ORAL_CAPSULE | Freq: Three times a day (TID) | ORAL | Status: DC
  Administered 2015-08-12 – 2015-08-16 (×13): 100 mg via ORAL
  Filled 2015-08-12 (×13): qty 1

## 2015-08-12 MED ORDER — AMLODIPINE BESYLATE 5 MG PO TABS
5.0000 mg | ORAL_TABLET | Freq: Every day | ORAL | Status: DC
  Administered 2015-08-12 – 2015-08-16 (×5): 5 mg via ORAL
  Filled 2015-08-12 (×5): qty 1

## 2015-08-12 NOTE — Progress Notes (Addendum)
[name] [name] WUJ:[id number] DOB: 1945/12/26 DOA: 08/09/2015 PCP: [name], MD  Assessment at time of admission by Dr Susie Cassette on 08/09/15 70 y.o. 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 Who came to the emergency department brought in via EMS with complaints of generalized abdominal pain, nausea, received 4 mg of Zofran by EMS, apparently has been constipated for 1 week, intermittent nausea but no vomiting. Patient son who provides most of the history states that she was on antibiotics 2 weeks ago for pneumonia, patient's oxygen saturation on room air was found to be 88%. She was also found to have a fever of 101.2, patient is somnolent and arousable to sternal lab but unable to provide any meaningful history. Upon exam the patient was found to be confused, 99% on 4 L of oxygen, ER initiated code sepsis with vancomycin, Zosyn, cultures, and small IV fluid bolus because of the patient's history of CHF. Lab work showed a lactate of 3.5, WBC 14.6, venous pH 7.35 with CO2 63, BNP 130. Chest x-ray negative acute. Based on reassuring chest x-ray and BNP, gave more fluid to total 30 mg/kg. attained CT to rule out acute intra-abdominal process. CT without acute findings to explain the patient's fever. UA indicates infection and I suspect a UTI is the source of the patient's sepsis, PCCM was called and , Dr. Kendrick Fries thought that the patient was appropriate for stepdown admission. Summary&Daily Progress Notes since admission 08/10/15: I saw and examined [name] at bedside, reviewed her chart and discussed with nursing. I also called her son [name] and left a message. Appreciate PCCM. Patient required BIPAP support overnight and remains confused,, baseline mentation not clear at present. There is possibility of  aspiration/CAP not evident on initial CXR because of dehydration. Will repeat chest x-ray since patient is now hydrated, obtain CBC/CMP/magnesium/phosphorus/lactic acid/abg, add Zithromax to cover atypical organisms for possible pneumonia, d/c V/Q scan, continue therapeutic doses of Lovenox for now and consider CTA chest in am depending on clinical progress. 08/11/15: Off BIPAP. White count improved to 11.200. Septic work up remains negative. Fever subsided. Has diarrhea which is probably antibiotic induced. Will obtain CTA chest, d/c Vancomycin, plan quick steroid taper, Increase Lantus gradually to optimize DM control, give Imodium, discontinue normal saline, mobilize patient and transfer to med surg when bed becomes available. 959a:CTA chest does not suggest PE, therefore will change Lovenox to therapeutic dose, "No acute chest abnormality. Negative for a pulmonary embolism. Noevidence for an aortic dissection.Small pulmonary nodules as described. No significantly changed since 04/17/2015. At least 1 of these nodules may represent a calcified granuloma. Largest nodule measures up to 5 mm. If the patient is at high risk for bronchogenic carcinoma, follow-up chest CT at 6-12 months is recommended. If the patient is at low risk for bronchogenic carcinoma, follow-up chest CT at 12 months is recommended. This recommendation follows the consensus statement: Guidelines for Management of Small Pulmonary Nodules Detected on CT Scans: A Statement from the Fleischner Society as published in Radiology 2005;237:395-400". 330p:Okay to keep patient stepdown unit today as she has dyspnea well as lactic acidosis. Will increase fluids to 100 cc/hr, and place Foley catheter as incontinent. 08/12/15: Had fever 100.25F last night. This is concerning. Will watch for now. BP/blood sugar uncontrolled. Add Norvasc, continue to gradually increase Lantus to optimize control. Will follow septic work up- ?broaden  antibiotics/consult ID if  fever recurs, otherwise continue current antibiotics for now. Patient somewhat somnolent today. Will hold Baclofen, reduce Neurontin dose for now and scaled back on narcotics until patient less tenuous respiratory wise. Will check lactic acid level(Not sure of cause of acidosis), check troponin/CPK/CK-MB/2-D echo, give dose of Lasix and reduce rate of normal saline to 50 cc/hr. Will transfer patient to telemetry when bed becomes available. Problem List Plan  Principal Problem:   Sepsis (HCC) Active Problems:   UTI (lower urinary tract infection)   Metabolic encephalopathy   Diabetes (HCC)   Chronic pain disorder   Hypertension   Quadriplegia (HCC)   Chronic diastolic (congestive) heart failure (HCC)   Lactic acidosis   Acute respiratory failure with hypoxia and hypercarbia (HCC)   Diarrhea   Follow septic work up  Day 3 Zosyn  Day 3 Zithromax  Norvasc  Lasix 20 mg IV 1  2D echo/lactic acid level/troponin/cpk level  Reduce normal to 50 mL/h  Increase Lantus to 20 units daily  Hold Baclofen/reduce Neurontin dose for now  Transfer to telemetry when bed becomes available  Code Status: Full Code Family Communication: Called patient's son [name] at [phone] and left message to call back on 08/11/15 Disposition Plan: Transfer to telemetry when bed becomes available Consultants:  None Procedures:  On BIPAP Antibiotics:  Vancomycin 08/09/15>>08/11/15  Zosyn 08/09/15>>  Zithromax 08/10/15>>  HPI/Subjective: Feels okay. No complaints.  Objective: Filed Vitals:   08/12/15 0500 08/12/15 0600  BP: 199/106 176/96  Pulse:    Temp:    Resp: 25 25    Intake/Output Summary (Last 24 hours) at 08/12/15 0802 Last data filed at 08/12/15 0600  Gross per 24 hour  Intake   2125 ml  Output    950 ml  Net   1175 ml   Filed Weights   08/09/15 1053 08/09/15 1800 08/10/15 0400  Weight: 90.719 kg (200 lb) 92.3 kg (203 lb 7.8 oz) 93.4 kg (205 lb 14.6 oz)    Exam:   General:   Comfortable at rest.  Cardiovascular: S1-S2 normal. No murmurs. Pulse regular.  Respiratory: Mild respiratory distress. Good air entry bilaterally. No rhonchi or rales.  Abdomen: Soft and nontender. Normal bowel sounds. No organomegaly.  Musculoskeletal: No pedal edema   Neurological: Intact  Data Reviewed: Basic Metabolic Panel:  Recent Labs Lab 08/09/15 1050 08/10/15 0930 08/11/15 0402 08/12/15 0344  NA 139 139 138 138  K 4.4 4.8 4.8 3.8  CL 95* 102 101 104  CO2 33* 27 27 25   GLUCOSE 264* 310* 312* 251*  BUN 13 12 10 12   CREATININE 0.72 0.72 0.69 0.55  CALCIUM 8.9 8.2* 8.6* 8.4*  MG  --  2.1  --   --   PHOS  --  3.1  --   --    Liver Function Tests:  Recent Labs Lab 08/09/15 1050 08/09/15 1838 08/10/15 0930 08/11/15 0402  AST 31 28 27  51*  ALT 32 34 31 35  ALKPHOS 51 50 47 44  BILITOT 1.0 0.9 0.9 1.0  PROT 6.5 6.5 6.3* 6.6  ALBUMIN 3.6 3.6 3.3* 3.5    Recent Labs Lab 08/09/15 1050  LIPASE 20   No results for input(s): AMMONIA in the last 168 hours. CBC:  Recent Labs Lab 08/09/15 1050 08/10/15 0930 08/11/15 0402 08/12/15 0344  WBC 14.6* 13.2* 11.3* 9.2  NEUTROABS  --  12.1* 9.8* 7.9*  HGB 14.2 13.3 12.8 12.8  HCT 45.8 42.3 41.0 40.7  MCV  86.9 87.4 87.6 86.4  PLT 212 198 238 246   Cardiac Enzymes:  Recent Labs Lab 08/09/15 1838 08/09/15 2328 08/10/15 0510 08/11/15 1606  CKTOTAL  --   --   --  86  CKMB  --   --   --  4.6  TROPONINI <0.03 <0.03 <0.03  --    BNP (last 3 results)  Recent Labs  04/17/15 1043 04/30/15 1120 08/09/15 1050  BNP 77.7 46.4 130.8*    ProBNP (last 3 results) No results for input(s): PROBNP in the last 8760 hours.  CBG:  Recent Labs Lab 08/10/15 2146 08/11/15 0743 08/11/15 1131 08/11/15 1716 08/11/15 2147  GLUCAP 289* 252* 298* 239* 216*    Recent Results (from the past 240 hour(s))  Blood Culture (routine x 2)     Status: None (Preliminary result)   Collection Time: 08/09/15 10:50 AM   Result Value Ref Range Status   Specimen Description BLOOD LEFT HAND  Final   Special Requests BOTTLES DRAWN AEROBIC AND ANAEROBIC 5CC  Final   Culture   Final    NO GROWTH 2 DAYS Performed at [facility]    Report Status PENDING  Incomplete  Blood Culture (routine x 2)     Status: None (Preliminary result)   Collection Time: 08/09/15 10:50 AM  Result Value Ref Range Status   Specimen Description BLOOD LEFT ANTECUBITAL  Final   Special Requests BOTTLES DRAWN AEROBIC AND ANAEROBIC 5CC EA  Final   Culture   Final    NO GROWTH 2 DAYS Performed at [facility]    Report Status PENDING  Incomplete  Urine culture     Status: None   Collection Time: 08/09/15  2:11 PM  Result Value Ref Range Status   Specimen Description URINE, CATHETERIZED  Final   Special Requests NONE  Final   Culture   Final    NO GROWTH 1 DAY Performed at [facility]    Report Status 08/10/2015 FINAL  Final  MRSA PCR Screening     Status: None   Collection Time: 08/09/15  6:08 PM  Result Value Ref Range Status   MRSA by PCR NEGATIVE NEGATIVE Final    Comment:        The GeneXpert MRSA Assay (FDA approved for NASAL specimens only), is one component of a comprehensive MRSA colonization surveillance program. It is not intended to diagnose MRSA infection nor to guide or monitor treatment for MRSA infections.      Studies: Ct Angio Chest Pe W/cm &/or Wo Cm  08/11/2015  CLINICAL DATA:  Hypoxia and possible aspiration. Evaluate for pulmonary embolism. Patient is quadriplegic. EXAM: CT ANGIOGRAPHY CHEST WITH CONTRAST TECHNIQUE: Multidetector CT imaging of the chest was performed using the standard protocol during bolus administration of intravenous contrast. Multiplanar CT image reconstructions and MIPs were obtained to evaluate the vascular anatomy. CONTRAST:  OMNIPAQUE IOHEXOL 350 MG/ML SOLN COMPARISON:  04/07/2015 FINDINGS: Mediastinum/Lymph Nodes: Negative for pulmonary embolism.  Normal caliber of the thoracic aorta without dissection. Mild atherosclerotic plaque at the aortic arch. Great vessels are patent. No significant chest lymphadenopathy. There are coronary artery calcifications. No significant pericardial fluid. Lungs/Pleura: Trachea and mainstem bronchi are patent. Again noted are a few dependent and basilar lung densities which are similar to the previous examination and probably represent a combination of scarring and atelectasis. There is a 5 mm subtle density in the right upper lobe. This nodular structure has a flat configuration on the coronal and  sagittal reformats. In addition, there is probably a small focus of calcium within this structure on based on the soft tissue windows. This structure is present on sequence 7, image 36. There is also punctate nodular density along the right minor fissure which is stable on sequence 7, image 44. Stable 5 mm nodular density in the right lower lobe superior segment on sequence 7, image 47. There is mild pleural thickening, particularly on left side. No significant pleural effusions. Upper abdomen: Low-attenuation of the liver is suggestive for hepatic steatosis. No acute abnormalities in the upper abdomen. Musculoskeletal: No acute bone abnormality. Review of the MIP images confirms the above findings. IMPRESSION: No acute chest abnormality. Negative for a pulmonary embolism. No evidence for an aortic dissection. Small pulmonary nodules as described. No significantly changed since 04/17/2015. At least 1 of these nodules may represent a calcified granuloma. Largest nodule measures up to 5 mm. If the patient is at high risk for bronchogenic carcinoma, follow-up chest CT at 6-12 months is recommended. If the patient is at low risk for bronchogenic carcinoma, follow-up chest CT at 12 months is recommended. This recommendation follows the consensus statement: Guidelines for Management of Small Pulmonary Nodules Detected on CT Scans: A  Statement from the Fleischner Society as published in Radiology 2005;237:395-400. Electronically Signed   By: Richarda Overlie M.D.   On: 08/11/2015 09:20   Dg Chest Port 1 View  08/10/2015  CLINICAL DATA:  Shortness of Breath EXAM: PORTABLE CHEST 1 VIEW COMPARISON:  08/09/2015 FINDINGS: Cardiac shadow is mildly enlarged. The lungs are well aerated bilaterally. No focal infiltrate or sizable effusion is seen. No bony abnormality is noted. IMPRESSION: No active disease. Electronically Signed   By: Alcide Clever M.D.   On: 08/10/2015 09:10    Scheduled Meds: . antiseptic oral rinse  7 mL Mouth Rinse q12n4p  . aspirin EC  81 mg Oral q morning - 10a  . azithromycin  500 mg Intravenous Q24H  . baclofen  10 mg Oral BID  . calcium-vitamin D  1 tablet Oral Q breakfast  . chlorhexidine  15 mL Mouth Rinse BID  . enoxaparin (LOVENOX) injection  40 mg Subcutaneous Q24H  . gabapentin  300 mg Oral TID  . insulin aspart  0-15 Units Subcutaneous TID WC  . insulin aspart  0-5 Units Subcutaneous QHS  . insulin glargine  15 Units Subcutaneous Daily  . ipratropium  0.5 mg Nebulization Once  . levalbuterol  1.25 mg Nebulization Once  . methylPREDNISolone (SOLU-MEDROL) injection  40 mg Intravenous 3 times per day  . morphine  15 mg Oral Q12H  . pantoprazole  40 mg Oral Daily  . piperacillin-tazobactam (ZOSYN)  IV  3.375 g Intravenous Q8H  . propranolol  20 mg Oral BID  . saccharomyces boulardii  250 mg Oral BID  . sodium chloride flush  3 mL Intravenous Q12H  . tamsulosin  0.4 mg Oral QHS   Continuous Infusions: . sodium chloride 100 mL/hr at 08/12/15 0014       [facility]  [facility] Pager [phone]. If 7PM-7AM, please contact night-coverage at [email or web address], password [facility] 08/12/2015, 8:02 AM  LOS: 3 days

## 2015-08-12 NOTE — Progress Notes (Signed)
PT Cancellation Note  Patient Details Name: [name] [name] MRN: [id number] DOB: 1946-03-09   Cancelled Treatment:    Reason Eval/Treat Not Completed: Medical issues which prohibited therapy (patient is very drowsy, not staying aroused. Patient currently will require mechanical lift  for OOB. will f/u 08/13/15 for PT needs. 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 )   Sharen Heck PT [phone]  08/12/2015, 9:41 AM

## 2015-08-12 NOTE — Progress Notes (Signed)
Pharmacy Antibiotic Follow-up Note  [name] [name] is a 70 y.o. year-old female admitted on 08/09/2015.  The patient is currently on day 4 of Zosyn and day 3 Zithromycin for sepsis.  Assessment/Plan:  Continue Zosyn 3.375gm IV q8h (4hr extended infusions) Follow up renal function & cultures  Temp (24hrs), Avg:99.2 F (37.3 C), Min:98.5 F (36.9 C), Max:100.4 F (38 C)   Recent Labs Lab 08/09/15 1050 08/10/15 0930 08/11/15 0402 08/12/15 0344  WBC 14.6* 13.2* 11.3* 9.2    Recent Labs Lab 08/09/15 1050 08/10/15 0930 08/11/15 0402 08/12/15 0344  CREATININE 0.72 0.72 0.69 0.55   Estimated Creatinine Clearance: 69.6 mL/min (by C-G formula based on Cr of 0.55).    Allergies  Allergen Reactions  . Decadrol [Dexamethasone] Swelling    Swelling of face and stomach   . Sodium Phosphate Swelling    Swelling of face and stomach     Antimicrobials this admission: 2/2 >> Vanc >> 2/4 2/2 >> Zosyn >>  2/3 >> Zithro x 3 >> (2/5)  Levels/dose changes this admission:  Microbiology Results: 2/2 BCx: ngtd 2/2 UCx: NGF  2/2 MRSA PCR (-) 2/2 Influenza panel (-) Thank you for allowing pharmacy to be a part of this patient's care.  Loralee Pacas, PharmD, BCPS Pager: [phone]  08/12/2015 9:46 AM

## 2015-08-13 ENCOUNTER — Inpatient Hospital Stay (HOSPITAL_COMMUNITY): Payer: Medicare Other

## 2015-08-13 DIAGNOSIS — R0600 Dyspnea, unspecified: Secondary | ICD-10-CM

## 2015-08-13 LAB — CBC WITH DIFFERENTIAL/PLATELET
Basophils Absolute: 0 K/uL (ref 0.0–0.1)
Basophils Relative: 0 %
Eosinophils Absolute: 0 K/uL (ref 0.0–0.7)
Eosinophils Relative: 0 %
HCT: 43.1 % (ref 36.0–46.0)
Hemoglobin: 13.4 g/dL (ref 12.0–15.0)
Lymphocytes Relative: 9 %
Lymphs Abs: 0.9 K/uL (ref 0.7–4.0)
MCH: 27.1 pg (ref 26.0–34.0)
MCHC: 31.1 g/dL (ref 30.0–36.0)
MCV: 87.1 fL (ref 78.0–100.0)
Monocytes Absolute: 0.4 K/uL (ref 0.1–1.0)
Monocytes Relative: 4 %
Neutro Abs: 8.5 K/uL — ABNORMAL HIGH (ref 1.7–7.7)
Neutrophils Relative %: 87 %
Platelets: 244 K/uL (ref 150–400)
RBC: 4.95 MIL/uL (ref 3.87–5.11)
RDW: 15.3 % (ref 11.5–15.5)
WBC: 9.7 K/uL (ref 4.0–10.5)

## 2015-08-13 LAB — COMPREHENSIVE METABOLIC PANEL WITH GFR
ALT: 45 U/L (ref 14–54)
AST: 31 U/L (ref 15–41)
Albumin: 3.6 g/dL (ref 3.5–5.0)
Alkaline Phosphatase: 44 U/L (ref 38–126)
Anion gap: 9 (ref 5–15)
BUN: 13 mg/dL (ref 6–20)
CO2: 29 mmol/L (ref 22–32)
Calcium: 9.1 mg/dL (ref 8.9–10.3)
Chloride: 103 mmol/L (ref 101–111)
Creatinine, Ser: 0.6 mg/dL (ref 0.44–1.00)
GFR calc Af Amer: >60 mL/min
GFR calc non Af Amer: >60 mL/min
Glucose, Bld: 232 mg/dL — ABNORMAL HIGH (ref 65–99)
Potassium: 3.7 mmol/L (ref 3.5–5.1)
Sodium: 141 mmol/L (ref 135–145)
Total Bilirubin: 0.5 mg/dL (ref 0.3–1.2)
Total Protein: 6.6 g/dL (ref 6.5–8.1)

## 2015-08-13 LAB — GLUCOSE, CAPILLARY
Glucose-Capillary: 222 mg/dL — ABNORMAL HIGH (ref 65–99)
Glucose-Capillary: 239 mg/dL — ABNORMAL HIGH (ref 65–99)
Glucose-Capillary: 163 mg/dL — ABNORMAL HIGH (ref 65–99)
Glucose-Capillary: 174 mg/dL — ABNORMAL HIGH (ref 65–99)

## 2015-08-13 MED ORDER — INSULIN GLARGINE 100 UNIT/ML ~~LOC~~ SOLN
24.0000 [IU] | Freq: Every day | SUBCUTANEOUS | Status: DC
  Administered 2015-08-14 – 2015-08-16 (×3): 24 [IU] via SUBCUTANEOUS
  Filled 2015-08-13 (×3): qty 0.24

## 2015-08-13 MED ORDER — MORPHINE SULFATE (PF) 2 MG/ML IV SOLN
2.0000 mg | INTRAVENOUS | Status: DC | PRN
  Administered 2015-08-16: 2 mg via INTRAVENOUS
  Filled 2015-08-13: qty 1

## 2015-08-13 NOTE — Progress Notes (Addendum)
[name] [name] ZOX:[id number] DOB: Jun 25, 1946 DOA: 08/09/2015 PCP: [name], MD  Assessment at time of admission by Dr Susie Cassette on 08/09/15 70 y.o. 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 Who came to the emergency department brought in via EMS with complaints of generalized abdominal pain, nausea, received 4 mg of Zofran by EMS, apparently has been constipated for 1 week, intermittent nausea but no vomiting. Patient son who provides most of the history states that she was on antibiotics 2 weeks ago for pneumonia, patient's oxygen saturation on room air was found to be 88%. She was also found to have a fever of 101.2, patient is somnolent and arousable to sternal lab but unable to provide any meaningful history. Upon exam the patient was found to be confused, 99% on 4 L of oxygen, ER initiated code sepsis with vancomycin, Zosyn, cultures, and small IV fluid bolus because of the patient's history of CHF. Lab work showed a lactate of 3.5, WBC 14.6, venous pH 7.35 with CO2 63, BNP 130. Chest x-ray negative acute. Based on reassuring chest x-ray and BNP, gave more fluid to total 30 mg/kg. attained CT to rule out acute intra-abdominal process. CT without acute findings to explain the patient's fever. UA indicates infection and I suspect a UTI is the source of the patient's sepsis, PCCM was called and , Dr. Kendrick Fries thought that the patient was appropriate for stepdown admission. Summary&Daily Progress Notes since admission 08/10/15: I saw and examined Ms. Rishel at bedside, reviewed her chart and discussed with nursing. I also called her son [name] and left a message. Appreciate PCCM. Patient required BIPAP support overnight and remains confused,, baseline mentation not clear at present. There is possibility of  aspiration/CAP not evident on initial CXR because of dehydration. Will repeat chest x-ray since patient is now hydrated, obtain CBC/CMP/magnesium/phosphorus/lactic acid/abg, add Zithromax to cover atypical organisms for possible pneumonia, d/c V/Q scan, continue therapeutic doses of Lovenox for now and consider CTA chest in am depending on clinical progress. 08/11/15: Off BIPAP. White count improved to 11.200. Septic work up remains negative. Fever subsided. Has diarrhea which is probably antibiotic induced. Will obtain CTA chest, d/c Vancomycin, plan quick steroid taper, Increase Lantus gradually to optimize DM control, give Imodium, discontinue normal saline, mobilize patient and transfer to med surg when bed becomes available. 959a:CTA chest does not suggest PE, therefore will change Lovenox to therapeutic dose, "No acute chest abnormality. Negative for a pulmonary embolism. Noevidence for an aortic dissection.Small pulmonary nodules as described. No significantly changed since 04/17/2015. At least 1 of these nodules may represent a calcified granuloma. Largest nodule measures up to 5 mm. If the patient is at high risk for bronchogenic carcinoma, follow-up chest CT at 6-12 months is recommended. If the patient is at low risk for bronchogenic carcinoma, follow-up chest CT at 12 months is recommended. This recommendation follows the consensus statement: Guidelines for Management of Small Pulmonary Nodules Detected on CT Scans: A Statement from the Fleischner Society as published in Radiology 2005;237:395-400". 330p:Okay to keep patient stepdown unit today as she has dyspnea well as lactic acidosis. Will increase fluids to 100 cc/hr, and place Foley catheter as incontinent. 08/12/15: Had fever 100.64F last night. This is concerning. Will watch for now. BP/blood sugar uncontrolled. Add Norvasc, continue to gradually increase Lantus to optimize control. Will follow septic work up- ?broaden  antibiotics/consult ID if  fever recurs, otherwise continue current antibiotics for now. Patient somewhat somnolent today. Will hold Baclofen, reduce Neurontin dose for now and scaled back on narcotics until patient less tenuous respiratory wise. Will check lactic acid level(Not sure of cause of acidosis), check troponin/CPK/CK-MB/2-D echo, give dose of Lasix and reduce rate of normal saline to 50 cc/hr. Will transfer patient to telemetry when bed becomes available. 08/13/15: Lactic acidosis resolved. CPK/troponin normal. Sugars remain uncontrolled.Septic workup remains negative.  White count remains normal. Patient more comfortable. She has a facial rush but tells me this is chronic.we will continue current management and follow 2-D echocardiogram results. Patient will eventually return home where she says she gets 24-hour supervision. Problem List Plan  Principal Problem:   Sepsis (HCC) Active Problems:   UTI (lower urinary tract infection)   Metabolic encephalopathy   Diabetes (HCC)   Chronic pain disorder   Hypertension   Quadriplegia (HCC)   Chronic diastolic (congestive) heart failure (HCC)   Lactic acidosis   Acute respiratory failure with hypoxia and hypercarbia (HCC)   Diarrhea   Follow blood cultures/urine culture  Day 4 Zosyn  Follow 2-D echocardiogram results  Increase Lantus to 24 units daily  Continue current dose of baclofen/Neurontin  Code Status: Full Code Family Communication: Called patient's son [name] at [phone] and left message to call back on 08/11/15 Disposition Plan: Hopefully home with home health services in the next 48-72 hours if she continues to do well Consultants:  None Procedures:  On BIPAP Antibiotics:  Vancomycin 08/09/15>>08/11/15  Zosyn 08/09/15>>  Zithromax 08/10/15>>  HPI/Subjective: Feels better. She is grateful that she feels better  Objective: Filed Vitals:   08/13/15 0947 08/13/15 1415  BP: 174/80 159/83  Pulse:  98  Temp:  98.8 F (37.1 C)  Resp:  20     Intake/Output Summary (Last 24 hours) at 08/13/15 1444 Last data filed at 08/13/15 0981  Gross per 24 hour  Intake 1868.33 ml  Output   2750 ml  Net -881.67 ml   Filed Weights   08/09/15 1800 08/10/15 0400 08/13/15 0617  Weight: 92.3 kg (203 lb 7.8 oz) 93.4 kg (205 lb 14.6 oz) 93.4 kg (205 lb 14.6 oz)    Exam:   General:  Comfortable at rest. Facial rash.  Cardiovascular: S1-S2 normal. No murmurs. Pulse regular.  Respiratory: Good air entry bilaterally. No rhonchi or rales.  Abdomen: Soft and nontender. Normal bowel sounds. No organomegaly.  Musculoskeletal: No pedal edema   Neurological: Intact  Data Reviewed: Basic Metabolic Panel:  Recent Labs Lab 08/09/15 1050 08/10/15 0930 08/11/15 0402 08/12/15 0344 08/13/15 0606  NA 139 139 138 138 141  K 4.4 4.8 4.8 3.8 3.7  CL 95* 102 101 104 103  CO2 33* 27 27 25 29   GLUCOSE 264* 310* 312* 251* 232*  BUN 13 12 10 12 13   CREATININE 0.72 0.72 0.69 0.55 0.60  CALCIUM 8.9 8.2* 8.6* 8.4* 9.1  MG  --  2.1  --   --   --   PHOS  --  3.1  --   --   --    Liver Function Tests:  Recent Labs Lab 08/09/15 1050 08/09/15 1838 08/10/15 0930 08/11/15 0402 08/13/15 0606  AST 31 28 27  51* 31  ALT 32 34 31 35 45  ALKPHOS 51 50 47 44 44  BILITOT 1.0 0.9 0.9 1.0 0.5  PROT 6.5 6.5 6.3* 6.6 6.6  ALBUMIN 3.6 3.6 3.3* 3.5 3.6  Recent Labs Lab 08/09/15 1050  LIPASE 20   No results for input(s): AMMONIA in the last 168 hours. CBC:  Recent Labs Lab 08/09/15 1050 08/10/15 0930 08/11/15 0402 08/12/15 0344 08/13/15 0606  WBC 14.6* 13.2* 11.3* 9.2 9.7  NEUTROABS  --  12.1* 9.8* 7.9* 8.5*  HGB 14.2 13.3 12.8 12.8 13.4  HCT 45.8 42.3 41.0 40.7 43.1  MCV 86.9 87.4 87.6 86.4 87.1  PLT 212 198 238 246 244   Cardiac Enzymes:  Recent Labs Lab 08/09/15 1838 08/09/15 2328 08/10/15 0510 08/11/15 1606 08/12/15 0924 08/12/15 1634  CKTOTAL  --   --   --  86  --  36*  CKMB  --   --   --  4.6  --  3.0  TROPONINI  <0.03 <0.03 <0.03  --  <0.03  --    BNP (last 3 results)  Recent Labs  04/17/15 1043 04/30/15 1120 08/09/15 1050  BNP 77.7 46.4 130.8*    ProBNP (last 3 results) No results for input(s): PROBNP in the last 8760 hours.  CBG:  Recent Labs Lab 08/12/15 0811 08/12/15 1609 08/12/15 2233 08/13/15 0751 08/13/15 1154  GLUCAP 233* 251* 140* 222* 239*    Recent Results (from the past 240 hour(s))  Blood Culture (routine x 2)     Status: None (Preliminary result)   Collection Time: 08/09/15 10:50 AM  Result Value Ref Range Status   Specimen Description BLOOD LEFT HAND  Final   Special Requests BOTTLES DRAWN AEROBIC AND ANAEROBIC 5CC  Final   Culture   Final    NO GROWTH 3 DAYS Performed at [facility]    Report Status PENDING  Incomplete  Blood Culture (routine x 2)     Status: None (Preliminary result)   Collection Time: 08/09/15 10:50 AM  Result Value Ref Range Status   Specimen Description BLOOD LEFT ANTECUBITAL  Final   Special Requests BOTTLES DRAWN AEROBIC AND ANAEROBIC 5CC EA  Final   Culture   Final    NO GROWTH 3 DAYS Performed at [facility]    Report Status PENDING  Incomplete  Urine culture     Status: None   Collection Time: 08/09/15  2:11 PM  Result Value Ref Range Status   Specimen Description URINE, CATHETERIZED  Final   Special Requests NONE  Final   Culture   Final    NO GROWTH 1 DAY Performed at [facility]    Report Status 08/10/2015 FINAL  Final  MRSA PCR Screening     Status: None   Collection Time: 08/09/15  6:08 PM  Result Value Ref Range Status   MRSA by PCR NEGATIVE NEGATIVE Final    Comment:        The GeneXpert MRSA Assay (FDA approved for NASAL specimens only), is one component of a comprehensive MRSA colonization surveillance program. It is not intended to diagnose MRSA infection nor to guide or monitor treatment for MRSA infections.      Studies: No results found.  Scheduled Meds: . amLODipine   5 mg Oral Daily  . antiseptic oral rinse  7 mL Mouth Rinse q12n4p  . aspirin EC  81 mg Oral q morning - 10a  . calcium-vitamin D  1 tablet Oral Q breakfast  . chlorhexidine  15 mL Mouth Rinse BID  . enoxaparin (LOVENOX) injection  40 mg Subcutaneous Q24H  . gabapentin  100 mg Oral TID  . insulin aspart  0-15 Units Subcutaneous TID WC  .  insulin aspart  0-5 Units Subcutaneous QHS  . insulin glargine  20 Units Subcutaneous Daily  . ipratropium  0.5 mg Nebulization Once  . levalbuterol  1.25 mg Nebulization Once  . methylPREDNISolone (SOLU-MEDROL) injection  40 mg Intravenous 3 times per day  . morphine  15 mg Oral Q12H  . pantoprazole  40 mg Oral Daily  . piperacillin-tazobactam (ZOSYN)  IV  3.375 g Intravenous Q8H  . propranolol  20 mg Oral BID  . saccharomyces boulardii  250 mg Oral BID  . sodium chloride flush  3 mL Intravenous Q12H  . tamsulosin  0.4 mg Oral QHS   Continuous Infusions:    Jemaine Prokop  [facility] Pager [phone]. If 7PM-7AM, please contact night-coverage at [email or web address], password [facility] 08/13/2015, 2:44 PM  LOS: 4 days

## 2015-08-13 NOTE — Evaluation (Signed)
Physical Therapy Evaluation Patient Details Name: [name] [name] MRN: [id number] DOB: 18-Mar-1946 Today's Date: 08/13/2015   History of Present Illness  70 yo female admitted 2/2 17 with abdominal pain, fever, recurrent UTI, respiratory distress. H/O quadriparesis of longstanding. Has 24/7 caregu=ivers, Hoyer lift, total care.  Clinical Impression  Patient has longstanding tetraparesis, able to grossly use UE's. Has 24/7 caregivers and DME, hoyer lift. No further acute PT needs at this time. PT will sign off.    Follow Up Recommendations No PT follow up    Equipment Recommendations  None recommended by PT    Recommendations for Other Services       Precautions / Restrictions Restrictions Weight Bearing Restrictions: No      Mobility  Bed Mobility Overal bed mobility: Needs Assistance;+2 for physical assistance;+ 2 for safety/equipment Bed Mobility: Sidelying to Sit;Rolling Rolling: +2 for safety/equipment;Total assist  Patient declined OOB via lift at this time.         General bed mobility comments: able to  somewhat rweach for tuning, trunk and legs are rigid.  requires lift OOB which was not  performed. Nursing  can assist with maxu=isky,  Transfers                    Ambulation/Gait                Stairs            Wheelchair Mobility    Modified Rankin (Stroke Patients Only)       Balance                                             Pertinent Vitals/Pain Pain Assessment: No/denies pain    Home Living Family/patient expects to be discharged to:: Private residence Living Arrangements: Children Available Help at Discharge: Available 24 hours/day;Personal care attendant Type of Home: House       Home Layout: One level   Additional Comments: has Product/process development scientist    Prior Function Level of Independence: Needs assistance   Gait / Transfers Assistance Needed: total assist           Hand Dominance         Extremity/Trunk Assessment   Upper Extremity Assessment: RUE deficits/detail;LUE deficits/detail RUE Deficits / Details: active movement of extremeity will  groos grip, able to hold call bed. has soft touch bell.     LUE Deficits / Details: similar to R   Lower Extremity Assessment: RLE deficits/detail;LLE deficits/detail RLE Deficits / Details: contractures of ankles equinoc=varus, 1/5 hip flexion, decreased knee flexion ROM. LLE Deficits / Details: similar to R     Communication      Cognition Arousal/Alertness: Awake/alert Behavior During Therapy: WFL for tasks assessed/performed Overall Cognitive Status: Within Functional Limits for tasks assessed                      General Comments      Exercises        Assessment/Plan    PT Assessment Patent does not need any further PT services  PT Diagnosis     PT Problem List    PT Treatment Interventions     PT Goals (Current goals can be found in the Care Plan section) Acute Rehab PT Goals PT Goal Formulation: All assessment and education complete, DC therapy    Frequency  Barriers to discharge        Co-evaluation               End of Session   Activity Tolerance: Patient tolerated treatment well Patient left: in bed;with call bell/phone within reach Nurse Communication: Mobility status;Need for lift equipment         Time: 1015-1030 PT Time Calculation (min) (ACUTE ONLY): 15 min   Charges:   PT Evaluation $PT Eval Low Complexity: 1 Procedure     PT G CodesRada Hay 08/13/2015, 10:39 AM Blanchard Kelch PT [phone]

## 2015-08-13 NOTE — Progress Notes (Signed)
Echocardiogram 2D Echocardiogram has been performed.  [name] [name] 08/13/2015, 12:52 PM

## 2015-08-13 NOTE — Care Management Important Message (Signed)
Important Message  Patient Details IM Letter given to Cookie/Case Manager to present to Patient Name: Abigael Mogle MRN: [id number] Date of Birth: 1945/08/06   Medicare Important Message Given:  Yes    Mayreli, Alden 08/13/2015, 11:42 AMImportant Message  Patient Details  Name: Cheril Slattery MRN: [id number] Date of Birth: 03/01/1946   Medicare Important Message Given:  Yes    Deseri, Loss 08/13/2015, 11:41 AM

## 2015-08-13 NOTE — Progress Notes (Signed)
Inpatient Diabetes Program Recommendations  AACE/ADA: New Consensus Statement on Inpatient Glycemic Control (2015)  Target Ranges:  Prepandial:   less than 140 mg/dL      Peak postprandial:   less than 180 mg/dL (1-2 hours)      Critically ill patients:  140 - 180 mg/dL   Results for TERRILYN, TYNER (MRN [id number]) as of 08/13/2015 13:14  Ref. Range 08/12/2015 08:11 08/12/2015 16:09 08/12/2015 22:33  Glucose-Capillary Latest Ref Range: 65-99 mg/dL 409 (H) 811 (H) 914 (H)   Results for RAINN, ZUPKO (MRN [id number]) as of 08/13/2015 13:14  Ref. Range 08/13/2015 07:51 08/13/2015 11:54  Glucose-Capillary Latest Ref Range: 65-99 mg/dL 086 (H) 578 (H)    Home DM Meds: Amaryl 2 mg daily  Current Insulin Orders: Lantus 20 units daily      Novolog Moderate SSI (0-15 units) TID AC + HS     -Note Lantus increased to 20 units daily yesterday.  -Patient currently receiving IV Solumedrol 40 mg Q8 hours.    MD- Please consider the following in-hospital insulin adjustments while patient getting IV Steroids:  1. Increase Lantus to 25 units daily  2. Start Novolog Meal Coverage- Novolog 3 units tid with meals     --Will follow patient during hospitalization--  Ambrose Finland RN, MSN, CDE Diabetes Coordinator Inpatient Glycemic Control Team Team Pager: [phone] (8a-5p)

## 2015-08-13 NOTE — Progress Notes (Signed)
Bipap PRN/not located in pts. Room.

## 2015-08-14 ENCOUNTER — Encounter (HOSPITAL_COMMUNITY): Payer: Self-pay | Admitting: Physician Assistant

## 2015-08-14 DIAGNOSIS — I480 Paroxysmal atrial fibrillation: Secondary | ICD-10-CM

## 2015-08-14 DIAGNOSIS — I3139 Other pericardial effusion (noninflammatory): Secondary | ICD-10-CM | POA: Diagnosis present

## 2015-08-14 DIAGNOSIS — I5032 Chronic diastolic (congestive) heart failure: Secondary | ICD-10-CM

## 2015-08-14 DIAGNOSIS — R918 Other nonspecific abnormal finding of lung field: Secondary | ICD-10-CM

## 2015-08-14 DIAGNOSIS — I5033 Acute on chronic diastolic (congestive) heart failure: Secondary | ICD-10-CM

## 2015-08-14 DIAGNOSIS — I313 Pericardial effusion (noninflammatory): Secondary | ICD-10-CM | POA: Diagnosis present

## 2015-08-14 LAB — CBC
HEMATOCRIT: 44 % (ref 36.0–46.0)
HEMOGLOBIN: 13.6 g/dL (ref 12.0–15.0)
MCH: 26.7 pg (ref 26.0–34.0)
MCHC: 30.9 g/dL (ref 30.0–36.0)
MCV: 86.4 fL (ref 78.0–100.0)
Platelets: 251 10*3/uL (ref 150–400)
RBC: 5.09 MIL/uL (ref 3.87–5.11)
RDW: 15.2 % (ref 11.5–15.5)
WBC: 9.5 10*3/uL (ref 4.0–10.5)

## 2015-08-14 LAB — BASIC METABOLIC PANEL
Anion gap: 11 (ref 5–15)
BUN: 15 mg/dL (ref 6–20)
CHLORIDE: 100 mmol/L — AB (ref 101–111)
CO2: 29 mmol/L (ref 22–32)
Calcium: 9.4 mg/dL (ref 8.9–10.3)
Creatinine, Ser: 0.54 mg/dL (ref 0.44–1.00)
GFR calc Af Amer: >60 mL/min (ref >60)
GFR calc non Af Amer: >60 mL/min (ref >60)
GLUCOSE: 238 mg/dL — AB (ref 65–99)
POTASSIUM: 3.6 mmol/L (ref 3.5–5.1)
Sodium: 140 mmol/L (ref 135–145)

## 2015-08-14 LAB — GLUCOSE, CAPILLARY
GLUCOSE-CAPILLARY: 191 mg/dL — AB (ref 65–99)
GLUCOSE-CAPILLARY: 241 mg/dL — AB (ref 65–99)
Glucose-Capillary: 213 mg/dL — ABNORMAL HIGH (ref 65–99)
Glucose-Capillary: 234 mg/dL — ABNORMAL HIGH (ref 65–99)

## 2015-08-14 LAB — CULTURE, BLOOD (ROUTINE X 2)
CULTURE: NO GROWTH
CULTURE: NO GROWTH

## 2015-08-14 MED ORDER — FUROSEMIDE 10 MG/ML IJ SOLN
40.0000 mg | Freq: Once | INTRAMUSCULAR | Status: AC
  Administered 2015-08-14: 40 mg via INTRAVENOUS
  Filled 2015-08-14: qty 4

## 2015-08-14 MED ORDER — POTASSIUM CHLORIDE CRYS ER 20 MEQ PO TBCR
20.0000 meq | EXTENDED_RELEASE_TABLET | Freq: Every day | ORAL | Status: DC
  Administered 2015-08-14 – 2015-08-16 (×3): 20 meq via ORAL
  Filled 2015-08-14 (×3): qty 1

## 2015-08-14 MED ORDER — POTASSIUM CHLORIDE CRYS ER 20 MEQ PO TBCR
40.0000 meq | EXTENDED_RELEASE_TABLET | Freq: Once | ORAL | Status: AC
  Administered 2015-08-14: 40 meq via ORAL
  Filled 2015-08-14: qty 2

## 2015-08-14 MED ORDER — PROPRANOLOL HCL 10 MG PO TABS
30.0000 mg | ORAL_TABLET | Freq: Three times a day (TID) | ORAL | Status: DC
  Administered 2015-08-14 – 2015-08-16 (×6): 30 mg via ORAL
  Filled 2015-08-14 (×6): qty 3

## 2015-08-14 MED ORDER — DIPHENHYDRAMINE HCL 25 MG PO CAPS
25.0000 mg | ORAL_CAPSULE | Freq: Four times a day (QID) | ORAL | Status: DC | PRN
  Administered 2015-08-14: 25 mg via ORAL
  Filled 2015-08-14: qty 1

## 2015-08-14 MED ORDER — MAGNESIUM OXIDE 400 MG PO TABS
400.0000 mg | ORAL_TABLET | Freq: Two times a day (BID) | ORAL | Status: DC
  Administered 2015-08-14 – 2015-08-16 (×5): 400 mg via ORAL
  Filled 2015-08-14 (×10): qty 1

## 2015-08-14 MED ORDER — APIXABAN 5 MG PO TABS
5.0000 mg | ORAL_TABLET | Freq: Two times a day (BID) | ORAL | Status: DC
  Administered 2015-08-14 – 2015-08-16 (×4): 5 mg via ORAL
  Filled 2015-08-14 (×5): qty 1

## 2015-08-14 MED ORDER — TORSEMIDE 20 MG PO TABS
20.0000 mg | ORAL_TABLET | Freq: Every day | ORAL | Status: DC
  Administered 2015-08-15 – 2015-08-16 (×2): 20 mg via ORAL
  Filled 2015-08-14 (×2): qty 1

## 2015-08-14 MED ORDER — POTASSIUM CHLORIDE 20 MEQ/15ML (10%) PO SOLN
40.0000 meq | Freq: Once | ORAL | Status: DC
  Filled 2015-08-14: qty 30

## 2015-08-14 MED ORDER — POTASSIUM CHLORIDE CRYS ER 20 MEQ PO TBCR: 20.0000 meq | EXTENDED_RELEASE_TABLET | Freq: Every day | ORAL | Status: DC

## 2015-08-14 NOTE — Progress Notes (Signed)
[name] [name] WUJ:[id number] DOB: 1945/12/26 DOA: 08/09/2015 PCP: [name] Keens, MD  Assessment at time of admission by Dr [name] [name] on 08/09/15 70 y.o. 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 Who came to the emergency department brought in via EMS with complaints of generalized abdominal pain, nausea, received 4 mg of Zofran by EMS, apparently has been constipated for 1 week, intermittent nausea but no vomiting. Patient son who provides most of the history states that she was on antibiotics 2 weeks ago for pneumonia, patient's oxygen saturation on room air was found to be 88%. She was also found to have a fever of 101.2, patient is somnolent and arousable to sternal lab but unable to provide any meaningful history. Upon exam the patient was found to be confused, 99% on 4 L of oxygen, ER initiated code sepsis with vancomycin, Zosyn, cultures, and small IV fluid bolus because of the patient's history of CHF. Lab work showed a lactate of 3.5, WBC 14.6, venous pH 7.35 with CO2 63, BNP 130. Chest x-ray negative acute. Based on reassuring chest x-ray and BNP, gave more fluid to total 30 mg/kg. attained CT to rule out acute intra-abdominal process. CT without acute findings to explain the patient's fever. UA indicates infection and I suspect a UTI is the source of the patient's sepsis, PCCM was called and , Dr. Kendrick [name] thought that the patient was appropriate for stepdown admission. Summary&Daily Progress Notes since admission 08/10/15: I saw and examined [name] [name] at bedside, reviewed her chart and discussed with nursing. I also called her son [name] [name] and left a message. Appreciate PCCM. Patient required BIPAP support overnight and remains confused,, baseline mentation not clear at present. There is possibility of  aspiration/CAP not evident on initial CXR because of dehydration. Will repeat chest x-ray since patient is now hydrated, obtain CBC/CMP/magnesium/phosphorus/lactic acid/abg, add Zithromax to cover atypical organisms for possible pneumonia, d/c V/Q scan, continue therapeutic doses of Lovenox for now and consider CTA chest in am depending on clinical progress. 08/11/15: Off BIPAP. White count improved to 11.200. Septic work up remains negative. Fever subsided. Has diarrhea which is probably antibiotic induced. Will obtain CTA chest, d/c Vancomycin, plan quick steroid taper, Increase Lantus gradually to optimize DM control, give Imodium, discontinue normal saline, mobilize patient and transfer to med surg when bed becomes available. 959a:CTA chest does not suggest PE, therefore will change Lovenox to therapeutic dose, "No acute chest abnormality. Negative for a pulmonary embolism. Noevidence for an aortic dissection.Small pulmonary nodules as described. No significantly changed since 04/17/2015. At least 1 of these nodules may represent a calcified granuloma. Largest nodule measures up to 5 mm. If the patient is at high risk for bronchogenic carcinoma, follow-up chest CT at 6-12 months is recommended. If the patient is at low risk for bronchogenic carcinoma, follow-up chest CT at 12 months is recommended. This recommendation follows the consensus statement: Guidelines for Management of Small Pulmonary Nodules Detected on CT Scans: A Statement from the Fleischner Society as published in Radiology 2005;237:395-400". 330p:Okay to keep patient stepdown unit today as she has dyspnea well as lactic acidosis. Will increase fluids to 100 cc/hr, and place Foley catheter as incontinent. 08/12/15: Had fever 100.25F last night. This is concerning. Will watch for now. BP/blood sugar uncontrolled. Add Norvasc, continue to gradually increase Lantus to optimize control. Will follow septic work up- ?broaden  antibiotics/consult ID if  fever recurs, otherwise continue current antibiotics for now. Patient somewhat somnolent today. Will hold Baclofen, reduce Neurontin dose for now and scaled back on narcotics until patient less tenuous respiratory wise. Will check lactic acid level(Not sure of cause of acidosis), check troponin/CPK/CK-MB/2-D echo, give dose of Lasix and reduce rate of normal saline to 50 cc/hr. Will transfer patient to telemetry when bed becomes available. 08/13/15: Lactic acidosis resolved. CPK/troponin normal. Sugars remain uncontrolled.Septic workup remains negative. White count remains normal. Patient more comfortable. She has a facial rush but tells me this is chronic.we will continue current management and follow 2-D echocardiogram results. Patient will eventually return home where she says she gets 24-hour supervision. 08/14/15:  2-D echocardiogram shows normal ejection fraction and moderate pericardial effusion. Cause of pericardiac effusion unclear. Patient occasionally tachycardic. Septic workup negative. Patient feels better. Will consult cardiology regarding further steps for the pericardia effusion. Appreciate help. Will check sedimentation rate and continue Zosyn to complete 7-10 days of antibiotics. Patient will eventually discharge home with home health services(she has 24/7 assistance at home and she is confident she can manage) Problem List Plan  Principal Problem:   Sepsis (HCC) Active Problems:   UTI (lower urinary tract infection)   Metabolic encephalopathy   Diabetes (HCC)   Chronic pain disorder   Hypertension   Quadriplegia (HCC)   Chronic diastolic (congestive) heart failure (HCC)   Lactic acidosis   Acute respiratory failure with hypoxia and hypercarbia (HCC)   Diarrhea   Pericardial effusion   Follow cardiology recommendations regarding pericardia effusion  Check sed rate  Continue Zosyn to complete 7-10 days of antibiotics.   Eventually return home   Code Status: Full Code Family  Communication: Called patient's son [name] [name] at [phone] and left message to call back on 08/11/15 Disposition Plan: Hopefully home with home health services in the next 48-72 hours if she continues to do well Consultants:  Cardiology Procedures:   Antibiotics:  Vancomycin 08/09/15>>08/11/15  Zosyn 08/09/15>>  Zithromax 08/10/15>>08/13/15  HPI/Subjective: Feels great. No complaints.  Objective: Filed Vitals:   08/14/15 0030 08/14/15 0554  BP: 152/79 178/89  Pulse:  95  Temp:  98.4 F (36.9 C)  Resp:  20    Intake/Output Summary (Last 24 hours) at 08/14/15 1219 Last data filed at 08/14/15 0900  Gross per 24 hour  Intake    460 ml  Output   2400 ml  Net  -1940 ml   Filed Weights   08/10/15 0400 08/13/15 0617 08/14/15 0554  Weight: 93.4 kg (205 lb 14.6 oz) 93.4 kg (205 lb 14.6 oz) 94.1 kg (207 lb 7.3 oz)    Exam:   General:  Comfortable at rest.  Cardiovascular: S1-S2 normal. No murmurs. Pulse regular.  Respiratory: Good air entry bilaterally. No rhonchi or rales.  Abdomen: Soft and nontender. Normal bowel sounds. No organomegaly.  Musculoskeletal: No pedal edema   Neurological: Intact  Data Reviewed: Basic Metabolic Panel:  Recent Labs Lab 08/10/15 0930 08/11/15 0402 08/12/15 0344 08/13/15 0606 08/14/15 0536  NA 139 138 138 141 140  K 4.8 4.8 3.8 3.7 3.6  CL 102 101 104 103 100*  CO2 27 27 25 29 29   GLUCOSE 310* 312* 251* 232* 238*  BUN 12 10 12 13 15   CREATININE 0.72 0.69 0.55 0.60 0.54  CALCIUM 8.2* 8.6* 8.4* 9.1 9.4  MG 2.1  --   --   --   --   PHOS 3.1  --   --   --   --  Liver Function Tests:  Recent Labs Lab 08/09/15 1050 08/09/15 1838 08/10/15 0930 08/11/15 0402 08/13/15 0606  AST 31 28 27  51* 31  ALT 32 34 31 35 45  ALKPHOS 51 50 47 44 44  BILITOT 1.0 0.9 0.9 1.0 0.5  PROT 6.5 6.5 6.3* 6.6 6.6  ALBUMIN 3.6 3.6 3.3* 3.5 3.6    Recent Labs Lab 08/09/15 1050  LIPASE 20   No results for input(s): AMMONIA in the last 168  hours. CBC:  Recent Labs Lab 08/10/15 0930 08/11/15 0402 08/12/15 0344 08/13/15 0606 08/14/15 0536  WBC 13.2* 11.3* 9.2 9.7 9.5  NEUTROABS 12.1* 9.8* 7.9* 8.5*  --   HGB 13.3 12.8 12.8 13.4 13.6  HCT 42.3 41.0 40.7 43.1 44.0  MCV 87.4 87.6 86.4 87.1 86.4  PLT 198 238 246 244 251   Cardiac Enzymes:  Recent Labs Lab 08/09/15 1838 08/09/15 2328 08/10/15 0510 08/11/15 1606 08/12/15 0924 08/12/15 1634  CKTOTAL  --   --   --  86  --  36*  CKMB  --   --   --  4.6  --  3.0  TROPONINI <0.03 <0.03 <0.03  --  <0.03  --    BNP (last 3 results)  Recent Labs  04/17/15 1043 04/30/15 1120 08/09/15 1050  BNP 77.7 46.4 130.8*    ProBNP (last 3 results) No results for input(s): PROBNP in the last 8760 hours.  CBG:  Recent Labs Lab 08/13/15 1154 08/13/15 1704 08/13/15 2217 08/14/15 0754 08/14/15 1201  GLUCAP 239* 163* 174* 213* 234*    Recent Results (from the past 240 hour(s))  Blood Culture (routine x 2)     Status: None (Preliminary result)   Collection Time: 08/09/15 10:50 AM  Result Value Ref Range Status   Specimen Description BLOOD LEFT HAND  Final   Special Requests BOTTLES DRAWN AEROBIC AND ANAEROBIC 5CC  Final   Culture   Final    NO GROWTH 4 DAYS Performed at [facility]    Report Status PENDING  Incomplete  Blood Culture (routine x 2)     Status: None (Preliminary result)   Collection Time: 08/09/15 10:50 AM  Result Value Ref Range Status   Specimen Description BLOOD LEFT ANTECUBITAL  Final   Special Requests BOTTLES DRAWN AEROBIC AND ANAEROBIC 5CC EA  Final   Culture   Final    NO GROWTH 4 DAYS Performed at [facility]    Report Status PENDING  Incomplete  Urine culture     Status: None   Collection Time: 08/09/15  2:11 PM  Result Value Ref Range Status   Specimen Description URINE, CATHETERIZED  Final   Special Requests NONE  Final   Culture   Final    NO GROWTH 1 DAY Performed at [facility]    Report Status  08/10/2015 FINAL  Final  MRSA PCR Screening     Status: None   Collection Time: 08/09/15  6:08 PM  Result Value Ref Range Status   MRSA by PCR NEGATIVE NEGATIVE Final    Comment:        The GeneXpert MRSA Assay (FDA approved for NASAL specimens only), is one component of a comprehensive MRSA colonization surveillance program. It is not intended to diagnose MRSA infection nor to guide or monitor treatment for MRSA infections.      Studies: No results found.  Scheduled Meds: . amLODipine  5 mg Oral Daily  . antiseptic oral rinse  7 mL Mouth  Rinse q12n4p  . aspirin EC  81 mg Oral q morning - 10a  . calcium-vitamin D  1 tablet Oral Q breakfast  . chlorhexidine  15 mL Mouth Rinse BID  . enoxaparin (LOVENOX) injection  40 mg Subcutaneous Q24H  . gabapentin  100 mg Oral TID  . insulin aspart  0-15 Units Subcutaneous TID WC  . insulin aspart  0-5 Units Subcutaneous QHS  . insulin glargine  24 Units Subcutaneous Daily  . ipratropium  0.5 mg Nebulization Once  . levalbuterol  1.25 mg Nebulization Once  . methylPREDNISolone (SOLU-MEDROL) injection  40 mg Intravenous 3 times per day  . morphine  15 mg Oral Q12H  . pantoprazole  40 mg Oral Daily  . piperacillin-tazobactam (ZOSYN)  IV  3.375 g Intravenous Q8H  . potassium chloride  40 mEq Oral Once  . propranolol  20 mg Oral BID  . saccharomyces boulardii  250 mg Oral BID  . sodium chloride flush  3 mL Intravenous Q12H  . tamsulosin  0.4 mg Oral QHS   Continuous Infusions:    Equilla Que  [facility] Pager [phone]. If 7PM-7AM, please contact night-coverage at [email or web address], password [facility] 08/14/2015, 12:19 PM  LOS: 5 days

## 2015-08-14 NOTE — Consult Note (Signed)
Cardiology Consultation Note    Patient ID: [name] [name], MRN: [id number], DOB/AGE: [birth year] 71 y.o. Admit date: 08/09/2015   Date of Consult: 08/14/2015 Primary Physician: [name], MD Primary Cardiologist: Seen by Dr. Royann Shivers in 2015 inpatient  Chief Complaint: fever Reason for Consultation: pericardial effusion, atrial fibrillation Requesting MD: Dr. 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 She was last evaluated by inpatient cardiology in 03/2014 for possible atrial flutter later felt due to artifact from skeletal muscle tremor, along with prolonged QT in the setting of metabolic abnormalities in the setting of AMS, FTT, and sepsis.  With regard to this admission on 08/09/2015, the patient had recent constipation, nausea, abdominal pain, fever, and was somnolent/confused on arrival. She had recently been treated for PNA as outpatient. She was admitted with the diagnosis of sepsis and acute respiratory failure requiring BiPAP support. Workup thus far has been notable for lactic acidosis, leukocytosis, BNP 130, d-dimer 0.55, negative flu screen, (+) pyuria, neg troponins x 4, procalcitonin <0.10, normal TSH, normal CK/MB. CXR and CT head nonacute. CT abdomen with diverticulosis, mild hepatomegaly with diffuse hepatic steatosis, severe aortoiliofemoral atherosclerosis without evidence of aneurysm. CT chest negative for PE/dissection, was notable for pulm nodules with recommendation for f/u CT 6-12 mo. Vitals reveal persistent hypertension with BP 150s-190s systolic. Telemetry notable for low-grade sinus tach with brief episode of what appeared to be atrial fibrillation with RVR this morning for about a half hour - this  appears real this time, with abrupt start/stop to the arrhythmia. EKG was unfortunately obtained after arrhythmia had ceased. 2D Echo 08/13/15: EF 65-70%, insufficiency to allow for diastolic evaluation, upper limits normal LA size, modserate sized circumferencial pericardial effusion, cannot exclude tamponade physiology given RVH. She has been treated with empiric abx and steroids with significant improvement. She feels much better today and says she is hoping to go home tomorrow actually. Denies SOB, chest pain, nausea, vomiting, syncope but + LEE. She is no longer febrile. She denies any symptoms at time of her atrial fib.    Past Medical History  Diagnosis Date  . GERD (gastroesophageal reflux disease)   . Quadriplegia (HCC)     a. 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  . Hypertension   . Pressure ulcer of foot   . Chronic pain disorder   . Hyperlipidemia   . Occasional tremors   . Chronic diastolic CHF (congestive heart failure) (HCC)   . Diabetes mellitus (HCC)   . History of frequent urinary tract infections   . History of ESBL E. coli infection   . Sepsis (HCC)     a. several prior adm for sepsis.      Surgical History:  Past Surgical History  Procedure Laterality Date  . Back surgery  08/2013  . Abdominal hysterectomy       Home Meds: Prior to Admission medications   Medication Sig Start Date End Date Taking? Authorizing Provider  acetaminophen (TYLENOL) 325 MG tablet Take 2 tablets (650 mg total) by mouth every 6 (six) hours as needed for mild pain (or Fever >/= 101). 05/03/15  Yes Belkys A Regalado, MD  aspirin EC 81 MG tablet Take  81 mg by mouth every morning.    Yes Historical Provider, MD  baclofen (LIORESAL) 10 MG tablet Take 1 tablet (10 mg total) by mouth 2 (two) times daily. 05/03/15  Yes Belkys A Regalado, MD  calcium-vitamin D (OSCAL WITH D) 500-200 MG-UNIT per tablet Take 1 tablet by mouth daily with  breakfast.   Yes Historical Provider, MD  gabapentin (NEURONTIN) 300 MG capsule Take 300 mg by mouth 3 (three) times daily.   Yes Historical Provider, MD  glimepiride (AMARYL) 2 MG tablet Take 1.5 tablets (3 mg total) by mouth daily with breakfast. Patient taking differently: Take 2 mg by mouth daily with breakfast.  04/27/15  Yes Vassie Loll, MD  LORazepam (ATIVAN) 1 MG tablet Take 0.5 tablets (0.5 mg total) by mouth every 12 (twelve) hours as needed for anxiety. 05/03/15  Yes Belkys A Regalado, MD  magnesium oxide (MAG-OX) 400 MG tablet Take 400 mg by mouth 2 (two) times daily.   Yes Historical Provider, MD  Multiple Vitamins-Minerals ([employer] SPECTRAVITE ADULT 50+ PO) Take 1 tablet by mouth daily.   Yes Historical Provider, MD  omeprazole (PRILOSEC) 20 MG capsule Take 2 capsules (40 mg total) by mouth daily. Patient taking differently: Take 20 mg by mouth daily.  04/27/15  Yes Vassie Loll, MD  oxymorphone (OPANA ER) 20 MG 12 hr tablet Take 1 tablet (20 mg total) by mouth every 12 (twelve) hours. 05/03/15  Yes Belkys A Regalado, MD  potassium chloride SA (K-DUR,KLOR-CON) 20 MEQ tablet Take 1 tablet (20 mEq total) by mouth daily. 05/03/15  Yes Belkys A Regalado, MD  predniSONE (DELTASONE) 5 MG tablet Take 15 mg by mouth daily with breakfast.   Yes Historical Provider, MD  propranolol (INDERAL) 20 MG tablet Take 1 tablet (20 mg total) by mouth 2 (two) times daily. 05/03/15  Yes Belkys A Regalado, MD  rOPINIRole (REQUIP) 0.5 MG tablet Take 0.5 mg by mouth 2 (two) times daily. Give 1 tablet 1-3 hours before bedtime (around 1900) and than 1 tablet at bedtime (around 2200)   Yes Historical Provider, MD  rosuvastatin (CRESTOR) 10 MG tablet Take 10 mg by mouth daily. 08/07/15  Yes Historical Provider, MD  saccharomyces boulardii (FLORASTOR) 250 MG capsule Take 1 capsule (250 mg total) by mouth 2 (two) times daily. 04/27/15  Yes Vassie Loll, MD  tamsulosin (FLOMAX) 0.4 MG CAPS capsule Take 0.4 mg by mouth  at bedtime.    Yes Historical Provider, MD  torsemide (DEMADEX) 20 MG tablet Take 1 tablet (20 mg total) by mouth daily. 05/03/15  Yes Belkys A Regalado, MD  traZODone (DESYREL) 50 MG tablet Take 50 mg by mouth at bedtime. 04/14/15  Yes Historical Provider, MD  trifluoperazine (STELAZINE) 5 MG tablet Take 5 mg by mouth 2 (two) times daily as needed (for itching).   Yes Historical Provider, MD  sulfamethoxazole-trimethoprim (BACTRIM DS,SEPTRA DS) 800-160 MG tablet Take 1 tablet by mouth every 12 (twelve) hours. Patient not taking: Reported on 08/09/2015 04/27/15   Vassie Loll, MD    Inpatient Medications:  . amLODipine  5 mg Oral Daily  . antiseptic oral rinse  7 mL Mouth Rinse q12n4p  . aspirin EC  81 mg Oral q morning - 10a  . calcium-vitamin D  1 tablet Oral Q breakfast  . chlorhexidine  15 mL Mouth Rinse BID  . enoxaparin (LOVENOX) injection  40 mg Subcutaneous Q24H  . gabapentin  100 mg Oral TID  . insulin aspart  0-15 Units Subcutaneous TID WC  .  insulin aspart  0-5 Units Subcutaneous QHS  . insulin glargine  24 Units Subcutaneous Daily  . ipratropium  0.5 mg Nebulization Once  . levalbuterol  1.25 mg Nebulization Once  . methylPREDNISolone (SOLU-MEDROL) injection  40 mg Intravenous 3 times per day  . morphine  15 mg Oral Q12H  . pantoprazole  40 mg Oral Daily  . piperacillin-tazobactam (ZOSYN)  IV  3.375 g Intravenous Q8H  . potassium chloride  40 mEq Oral Once  . propranolol  20 mg Oral BID  . saccharomyces boulardii  250 mg Oral BID  . sodium chloride flush  3 mL Intravenous Q12H  . tamsulosin  0.4 mg Oral QHS      Allergies:  Allergies  Allergen Reactions  . Decadrol [Dexamethasone] Swelling    Swelling of face and stomach   . Sodium Phosphate Swelling    Swelling of face and stomach     Social History   Social History  . Marital Status: Unknown    Spouse Name: N/A  . Number of Children: N/A  . Years of Education: N/A   Occupational History  . Not on file.     Social History Main Topics  . Smoking status: Former Smoker -- 0.25 packs/day    Types: Cigarettes  . Smokeless tobacco: Never Used  . Alcohol Use: No  . Drug Use: No  . Sexual Activity: No   Other Topics Concern  . Not on file   Social History Narrative   ** Merged History Encounter **         Family History  Problem Relation Age of Onset  . Adopted: Yes     Review of Systems: All other systems reviewed and are otherwise negative except as noted above.  Labs:  Recent Labs  08/11/15 1606 08/12/15 0924 08/12/15 1634  CKTOTAL 86  --  36*  CKMB 4.6  --  3.0  TROPONINI  --  <0.03  --    Lab Results  Component Value Date   WBC 9.5 08/14/2015   HGB 13.6 08/14/2015   HCT 44.0 08/14/2015   MCV 86.4 08/14/2015   PLT 251 08/14/2015    Recent Labs Lab 08/13/15 0606 08/14/15 0536  NA 141 140  K 3.7 3.6  CL 103 100*  CO2 29 29  BUN 13 15  CREATININE 0.60 0.54  CALCIUM 9.1 9.4  PROT 6.6  --   BILITOT 0.5  --   ALKPHOS 44  --   ALT 45  --   AST 31  --   GLUCOSE 232* 238*   Lab Results  Component Value Date   DDIMER 0.55* 08/09/2015    Radiology/Studies:  Dg Chest 2 View  08/09/2015  CLINICAL DATA:  Generalized weakness, fever EXAM: CHEST  2 VIEW COMPARISON:  04/30/2015 FINDINGS: Borderline cardiomegaly. No infiltrate or pulmonary edema. Bony thorax is unremarkable. IMPRESSION: No active cardiopulmonary disease.  Borderline cardiomegaly. Electronically Signed   By: Natasha Mead M.D.   On: 08/09/2015 11:41   Ct Head Wo Contrast  08/09/2015  CLINICAL DATA:  Altered mental status, headache, recent pneumonia, hypertension, quadriplegia, history CHF EXAM: CT HEAD WITHOUT CONTRAST TECHNIQUE: Contiguous axial images were obtained from the base of the skull through the vertex without intravenous contrast. COMPARISON:  04/17/2015 FINDINGS: Mild generalized atrophy. Normal ventricular morphology. No midline shift or mass effect. Otherwise normal appearance of brain  parenchyma. No intracranial hemorrhage, mass lesion or evidence acute infarction. No extra-axial fluid collection. Sinuses clear and bones unremarkable. Atherosclerotic  calcifications at the carotid siphons. IMPRESSION: No acute intracranial abnormalities. Electronically Signed   By: Ulyses Southward M.D.   On: 08/09/2015 17:32   Ct Angio Chest Pe W/cm &/or Wo Cm  08/11/2015  CLINICAL DATA:  Hypoxia and possible aspiration. Evaluate for pulmonary embolism. Patient is quadriplegic. EXAM: CT ANGIOGRAPHY CHEST WITH CONTRAST TECHNIQUE: Multidetector CT imaging of the chest was performed using the standard protocol during bolus administration of intravenous contrast. Multiplanar CT image reconstructions and MIPs were obtained to evaluate the vascular anatomy. CONTRAST:  OMNIPAQUE IOHEXOL 350 MG/ML SOLN COMPARISON:  04/07/2015 FINDINGS: Mediastinum/Lymph Nodes: Negative for pulmonary embolism. Normal caliber of the thoracic aorta without dissection. Mild atherosclerotic plaque at the aortic arch. Great vessels are patent. No significant chest lymphadenopathy. There are coronary artery calcifications. No significant pericardial fluid. Lungs/Pleura: Trachea and mainstem bronchi are patent. Again noted are a few dependent and basilar lung densities which are similar to the previous examination and probably represent a combination of scarring and atelectasis. There is a 5 mm subtle density in the right upper lobe. This nodular structure has a flat configuration on the coronal and sagittal reformats. In addition, there is probably a small focus of calcium within this structure on based on the soft tissue windows. This structure is present on sequence 7, image 36. There is also punctate nodular density along the right minor fissure which is stable on sequence 7, image 44. Stable 5 mm nodular density in the right lower lobe superior segment on sequence 7, image 47. There is mild pleural thickening, particularly on left side.  No significant pleural effusions. Upper abdomen: Low-attenuation of the liver is suggestive for hepatic steatosis. No acute abnormalities in the upper abdomen. Musculoskeletal: No acute bone abnormality. Review of the MIP images confirms the above findings. IMPRESSION: No acute chest abnormality. Negative for a pulmonary embolism. No evidence for an aortic dissection. Small pulmonary nodules as described. No significantly changed since 04/17/2015. At least 1 of these nodules may represent a calcified granuloma. Largest nodule measures up to 5 mm. If the patient is at high risk for bronchogenic carcinoma, follow-up chest CT at 6-12 months is recommended. If the patient is at low risk for bronchogenic carcinoma, follow-up chest CT at 12 months is recommended. This recommendation follows the consensus statement: Guidelines for Management of Small Pulmonary Nodules Detected on CT Scans: A Statement from the Fleischner Society as published in Radiology 2005;237:395-400. Electronically Signed   By: Richarda Overlie M.D.   On: 08/11/2015 09:20   Ct Abdomen Pelvis W Contrast  08/09/2015  CLINICAL DATA:  70 year old long-time paraplegic due to remote spinal cord injury, presenting with acute onset of generalized abdominal pain and nausea which began yesterday. Patient diagnosed with pneumonia approximately 2 weeks ago for which she is completing antibiotic therapy. EXAM: CT ABDOMEN AND PELVIS WITH CONTRAST TECHNIQUE: Multidetector CT imaging of the abdomen and pelvis was performed using the standard protocol following bolus administration of intravenous contrast. CONTRAST:  OMNIPAQUE IOHEXOL 300 MG/ML IV. Oral contrast was also administered. COMPARISON:  04/24/2015, 03/18/2007. FINDINGS: Respiratory motion blurs images of the lower chest and upper abdomen. The study appears diagnostic, however. Lower chest: Linear scarring in the lower lobes, unchanged. Visualized lung bases otherwise clear. The 4 mm left lower lobe nodule  identified on the October, 2016 examination was not imaged today. Heart mildly to moderately enlarged with a very large amount of epicardial fat as noted previously. Hepatobiliary: Mild hepatomegaly with diffuse hepatic steatosis with focal sparing  surrounding the gallbladder and scattered areas of focal sparing peripherally in both lobes, less severe than on the October, 2016 examination. No significant focal hepatic parenchymal abnormality. Gallbladder normal in appearance without calcified gallstones. No biliary ductal dilation. Pancreas: Normal in appearance without evidence of mass, ductal dilation, or inflammation. Spleen: Normal in size and appearance. Adrenals/Urinary Tract: Normal appearing adrenal glands. Numerous cortical cysts involving both kidneys, the largest arising from the lower pole of the left kidney measuring approximately 4.5 cm. All mild diffuse cortical thinning involving both kidneys as noted previously. Atherosclerotic calcification involving the distal right main renal artery at the right renal hilus. No hydronephrosis. No urinary tract calculi. Urinary bladder unremarkable. Stomach/Bowel: Stomach normal in appearance for the degree of distention. Normal-appearing small bowel. Descending and sigmoid colon diverticulosis without evidence of acute diverticulitis. Moderate colonic stool burden. Surgical clips at the cecum from prior appendectomy. 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 Visceral arteries patent though atherosclerotic. No pathologic lymphadenopathy. Reproductive: Uterus and ovaries surgically absent. No adnexal masses. Other: Atrophic paraspinous muscles and visualized lower extremity muscles indicating long-standing tissues. Musculoskeletal: Osseous demineralization. Degenerative disc disease at L3-4 L4-5. Degenerative retrolisthesis of L4 relative to L5. Severe diffuse facet degenerative changes involving the lumbar spine. No acute  abnormality. IMPRESSION: 1. No acute abnormalities involving the abdomen or pelvis. 2. Descending and sigmoid colon diverticulosis without evidence of acute diverticulitis. 3. Mild hepatomegaly with diffuse hepatic steatosis. Focal sparing surrounding the gallbladder and peripherally in both hepatic lobes. No significant focal hepatic parenchymal abnormality. Steatosis is less severe than on the October, 2016 CT. 4. 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 Electronically Signed   By: Hulan Saas M.D.   On: 08/09/2015 13:32   Dg Chest Port 1 View  08/10/2015  CLINICAL DATA:  Shortness of Breath EXAM: PORTABLE CHEST 1 VIEW COMPARISON:  08/09/2015 FINDINGS: Cardiac shadow is mildly enlarged. The lungs are well aerated bilaterally. No focal infiltrate or sizable effusion is seen. No bony abnormality is noted. IMPRESSION: No active disease. Electronically Signed   By: Alcide Clever M.D.   On: 08/10/2015 09:10    Wt Readings from Last 3 Encounters:  08/14/15 207 lb 7.3 oz (94.1 kg)  05/03/15 206 lb 11.2 oz (93.759 kg)  04/23/15 194 lb 8 oz (88.225 kg)    EKG:  2/2: NSR no acute ST-T changes 2/7: borderline sinus tach 100, possible LAA, no acute ST-T changes   Physical Exam: Blood pressure 178/89, pulse 95, temperature 98.4 F (36.9 C), temperature source Oral, resp. rate 20, height 5\' 2"  (1.575 m), weight 207 lb 7.3 oz (94.1 kg), SpO2 97 %. Body mass index is 37.93 kg/(m^2). General: Well developed obese chronically ill appearing WF in no acute distress. Head: Normocephalic, atraumatic, sclera non-icteric, no xanthomas, nares are without discharge.  Neck: Negative for carotid bruits. JVD not elevated. Lungs: Clear anteriorly without wheezes, rales, or rhonchi. Breathing is unlabored. Heart: RRR (borderline elevated HR) with S1 S2. No murmurs, rubs, or gallops appreciated. Abdomen: Soft, non-tender, non-distended with normoactive bowel sounds. No hepatomegaly. No  rebound/guarding. No obvious abdominal masses. Msk:  Strength and tone appear normal for age. Extremities: No clubbing or cyanosis. 2+ BLE edema.  Neuro: Alert and oriented X 3. Psych:  Responds to questions appropriately with a normal affect.    Assessment and Plan  73F with h/o paraplegia due to spinal cord injury, uncontrolled DM (A1C 9.3), chronic pain, GERD, HTN, frequent UTIs including prior ESBL, prior admissions for sepsis/encephalopathy, HLD, chronic diastolic  CHF admitted with sepsis, acute encephaloapthy, acute respiratory failure. Findings notable for lactic acidosis, pyuria, hepatic steatosis, aortoiliofemoral atherosclerosis, pulmonary nodules, persistent hypertension. Cardiology asked to see for pericardial effusion on echocardiogram; also noted to have had brief episode of atrial fib on telemetry AM of 08/14/15, asymptomatic.  1. Sepsis with acute respiratory failure/acute encephalopathy - now totally lucent. Feeling much better. Per IM.  2. Pericardial effusion - clinically does not appear to have signs of tamponade on exam. No JVD, distant heart sounds, or hypotension. She is not symptomatic with this. Will review echo with MD and follow clinically for now.  3. Paroxysmal atrial fibrillation with RVR - asymptomatic. Spontaneously converted to NSR. TSH normal. CHADSVASC is 5 for HTN, age, female, vascular disease (PVD seen on CT), DM. She would benefit from anticoagulation - do not appear to have any contraindications to anticoagulation. Per d/w MD, will start Eliquis per pharmacy (ordered this way to get education for patient as well). Increase propranolol to 30mg  TID. She may be on this specific BB due to h/o tremor.  4. Acute on chronic diastolic CHF - weight has been consistently rising since admission. Obesity makes assessment challenging but she does appear volume overloaded. Will give Lasix 40mg  x 1, resume torsemide in AM, and resume home KCl (in addition to repletion already  ordered) and MagOx.  5. HTN - follow BP with above med changes.   6. Pulm nodules - will need f/u as recommended per IM.  Signed, Laurann Montana PA-C 08/14/2015, 1:02 PM Pager: [phone] As above, patient seen and examined. Briefly she is a 70 year old female with past medical history of paraplegia from previous work accident, diabetes mellitus, hypertension, recurrent UTIs, diastolic congestive heart failure who I'm asked to evaluate for pericardial effusion and paroxysmal atrial fibrillation. Patient was admitted with sepsis likely secondary to urinary tract infection. She has improved. Echocardiogram today was interpreted as moderate pericardial effusion and Not physiology could not be excluded. Cardiology is asked to evaluate. Patient denies dyspnea, chest pain, palpitations or syncope. Note telemetry has been personally reviewed and there is evidence of paroxysmal atrial fibrillation. Electrocardiogram shows sinus rhythm with nonspecific ST changes. 1 pericardial effusion-I have reviewed the patient's echocardiogram personally. I have also reviewed the patient's recent CT scan of her chest. There does not appear to be significant pericardial effusion on her CT scan and there appears to be significant epicardial fat on her echocardiogram and not effusion. I do not feel further evaluation is indicated. 2 paroxysmal atrial fibrillation-patient is noted to have asymptomatic paroxysmal atrial fibrillation on telemetry. She is now back in sinus rhythm. Increase propranolol to 30 mg by mouth 3 times a day. Note she is on this for a tremor. She has embolic risk factors of vascular disease, age greater than 22, diabetes mellitus, hypertension and female sex. We will begin apixaban 5 mg BID. She will need follow-up hemoglobin and BUN/creatinine in 4 weeks. 3 acute on chronic diastolic congestive heart failure-the patient is volume overloaded on examination. Would give Demadex 40 mg today and then resume home  dose of 20 mg daily tomorrow. Follow renal function closely. Other issues per primary care. Olga Millers

## 2015-08-14 NOTE — Progress Notes (Signed)
ANTICOAGULATION CONSULT NOTE - Initial Consult  Pharmacy Consult for Apixaban Indication: atrial fibrillation  Allergies  Allergen Reactions  . Decadrol [Dexamethasone] Swelling    Swelling of face and stomach   . Sodium Phosphate Swelling    Swelling of face and stomach     Patient Measurements: Height:  (157.5 cm) Weight: 207 lb 7.3 oz (94.1 kg) IBW/kg (Calculated) : 50.1  Vital Signs: Temp: 98.4 F (36.9 C) (02/07 0554) Temp Source: Oral (02/07 0554) BP: 178/89 mmHg (02/07 0554) Pulse Rate: 95 (02/07 0554)  Labs:  Recent Labs  08/11/15 1606  08/12/15 0344 08/12/15 0924 08/12/15 1634 08/13/15 0606 08/14/15 0536  HGB  --   < > 12.8  --   --  13.4 13.6  HCT  --   --  40.7  --   --  43.1 44.0  PLT  --   --  246  --   --  244 251  CREATININE  --   --  0.55  --   --  0.60 0.54  CKTOTAL 86  --   --   --  36*  --   --   CKMB 4.6  --   --   --  3.0  --   --   TROPONINI  --   --   --  <0.03  --   --   --   < > = values in this interval not displayed.  Estimated Creatinine Clearance: 69.9 mL/min (by C-G formula based on Cr of 0.54).   Medical History: Past Medical History  Diagnosis Date  . GERD (gastroesophageal reflux disease)   . Quadriplegia (HCC)     a. 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  . Hypertension   . Pressure ulcer of foot   . Chronic pain disorder   . Hyperlipidemia   . Occasional tremors   . Chronic diastolic CHF (congestive heart failure) (HCC)   . Diabetes mellitus (HCC)   . History of frequent urinary tract infections   . History of ESBL E. coli infection   . Sepsis (HCC)     a. several prior adm for sepsis.  . Atrial fibrillation (HCC)     a. Observed during 08/2015 admission.  . Pericardial effusion     a. Observed during 08/2015 admission - moderate.  . Pulmonary nodules     Medications:  Scheduled:  . amLODipine  5 mg Oral Daily  . antiseptic oral rinse  7 mL Mouth  Rinse q12n4p  . aspirin EC  81 mg Oral q morning - 10a  . calcium-vitamin D  1 tablet Oral Q breakfast  . chlorhexidine  15 mL Mouth Rinse BID  . furosemide  40 mg Intravenous Once  . gabapentin  100 mg Oral TID  . insulin aspart  0-15 Units Subcutaneous TID WC  . insulin aspart  0-5 Units Subcutaneous QHS  . insulin glargine  24 Units Subcutaneous Daily  . ipratropium  0.5 mg Nebulization Once  . levalbuterol  1.25 mg Nebulization Once  . magnesium oxide  400 mg Oral BID  . methylPREDNISolone (SOLU-MEDROL) injection  40 mg Intravenous 3 times per day  . morphine  15 mg Oral Q12H  . pantoprazole  40 mg Oral Daily  . piperacillin-tazobactam (ZOSYN)  IV  3.375 g Intravenous Q8H  . potassium chloride SA  20 mEq Oral Daily  . potassium chloride  40 mEq Oral Once  .  propranolol  30 mg Oral TID  . saccharomyces boulardii  250 mg Oral BID  . sodium chloride flush  3 mL Intravenous Q12H  . tamsulosin  0.4 mg Oral QHS  . [START ON 08/15/2015] torsemide  20 mg Oral Daily   Infusions:    Assessment: [name] [name] is a 70 y.o. year-old female admitted on 08/09/2015 and is being treated with broad spectrum antibiotics for sepsis.  PMH includes quadriplegia, DM, chronic pain, GERD, HTN, UTI, HLD, and chronic diastolic CHF.  Cardiology consult notes telemetry with episode of atrial fibrillation with RVR today.  Pharmacy is now consulted to dose apixaban.  No prior to admission anticoagulation.  On admission, she was initially placed on treatment dose Lovenox (2/2-2/4) for r/o PE, but decreased to prophylaxis dosing on 2/5 when CTa was negative.   Today, 08/14/2015: CBC remains WNL. Renal: SCr 0.5 with CrCl ~ 70 ml/min  Goal of Therapy:  Appropriate apixaban dosing, VTE prevention Monitor platelets by anticoagulation protocol: Yes   Plan:   Discontinue prophylactic Lovenox  Start Apixban  PO BID.  Monitor CBC, renal function, s/s bleeding.  Pharmacy to provide education prior to  discharge.  Lynann Beaver PharmD, BCPS Pager [phone] 08/14/2015 1:57 PM

## 2015-08-15 DIAGNOSIS — I319 Disease of pericardium, unspecified: Secondary | ICD-10-CM

## 2015-08-15 DIAGNOSIS — A419 Sepsis, unspecified organism: Principal | ICD-10-CM

## 2015-08-15 DIAGNOSIS — E119 Type 2 diabetes mellitus without complications: Secondary | ICD-10-CM | POA: Insufficient documentation

## 2015-08-15 DIAGNOSIS — R918 Other nonspecific abnormal finding of lung field: Secondary | ICD-10-CM

## 2015-08-15 DIAGNOSIS — J9602 Acute respiratory failure with hypercapnia: Secondary | ICD-10-CM

## 2015-08-15 DIAGNOSIS — J9601 Acute respiratory failure with hypoxia: Secondary | ICD-10-CM

## 2015-08-15 DIAGNOSIS — I10 Essential (primary) hypertension: Secondary | ICD-10-CM

## 2015-08-15 LAB — CBC
HCT: 47.5 % — ABNORMAL HIGH (ref 36.0–46.0)
Hemoglobin: 14.6 g/dL (ref 12.0–15.0)
MCH: 26.9 pg (ref 26.0–34.0)
MCHC: 30.7 g/dL (ref 30.0–36.0)
MCV: 87.5 fL (ref 78.0–100.0)
PLATELETS: 246 10*3/uL (ref 150–400)
RBC: 5.43 MIL/uL — AB (ref 3.87–5.11)
RDW: 15.4 % (ref 11.5–15.5)
WBC: 11.5 10*3/uL — AB (ref 4.0–10.5)

## 2015-08-15 LAB — GLUCOSE, CAPILLARY
GLUCOSE-CAPILLARY: 183 mg/dL — AB (ref 65–99)
GLUCOSE-CAPILLARY: 289 mg/dL — AB (ref 65–99)
Glucose-Capillary: 173 mg/dL — ABNORMAL HIGH (ref 65–99)
Glucose-Capillary: 242 mg/dL — ABNORMAL HIGH (ref 65–99)

## 2015-08-15 LAB — BASIC METABOLIC PANEL
ANION GAP: 13 (ref 5–15)
BUN: 18 mg/dL (ref 6–20)
CALCIUM: 9.6 mg/dL (ref 8.9–10.3)
CO2: 30 mmol/L (ref 22–32)
CREATININE: 0.57 mg/dL (ref 0.44–1.00)
Chloride: 98 mmol/L — ABNORMAL LOW (ref 101–111)
Glucose, Bld: 204 mg/dL — ABNORMAL HIGH (ref 65–99)
Potassium: 4.2 mmol/L (ref 3.5–5.1)
Sodium: 141 mmol/L (ref 135–145)

## 2015-08-15 LAB — MAGNESIUM: Magnesium: 2 mg/dL (ref 1.7–2.4)

## 2015-08-15 LAB — SEDIMENTATION RATE: SED RATE: 1 mm/h (ref 0–22)

## 2015-08-15 MED ORDER — PREDNISONE 20 MG PO TABS
40.0000 mg | ORAL_TABLET | Freq: Every day | ORAL | Status: DC
  Administered 2015-08-16: 40 mg via ORAL
  Filled 2015-08-15: qty 2

## 2015-08-15 MED ORDER — INSULIN ASPART 100 UNIT/ML ~~LOC~~ SOLN
3.0000 [IU] | Freq: Three times a day (TID) | SUBCUTANEOUS | Status: DC
  Administered 2015-08-16: 3 [IU] via SUBCUTANEOUS

## 2015-08-15 MED ORDER — LORAZEPAM 0.5 MG PO TABS
0.5000 mg | ORAL_TABLET | Freq: Once | ORAL | Status: AC
  Administered 2015-08-15: 0.5 mg via ORAL
  Filled 2015-08-15: qty 1

## 2015-08-15 NOTE — Progress Notes (Signed)
Pharmacy Antibiotic Follow-up Note  [name] [name] is a 70 y.o. year-old female admitted on 08/09/2015.  The patient is currently on day 7 of Zosyn for sepsis, r/o aspiration pneumonia.  She remains afebrile, WBC slightly increased today (chronic prednisone PTA, remains on Solumedrol IV TID since 2/4), and cultures are no growth.  Assessment/Plan:  Continue Zosyn 3.375gm IV q8h (4hr extended infusions)  Follow up renal function & cultures  Recommend narrowing antibiotics  Temp (24hrs), Avg:98.6 F (37 C), Min:98.3 F (36.8 C), Max:99.1 F (37.3 C)   Recent Labs Lab 08/11/15 0402 08/12/15 0344 08/13/15 0606 08/14/15 0536 08/15/15 0542  WBC 11.3* 9.2 9.7 9.5 11.5*     Recent Labs Lab 08/11/15 0402 08/12/15 0344 08/13/15 0606 08/14/15 0536 08/15/15 0542  CREATININE 0.69 0.55 0.60 0.54 0.57   Estimated Creatinine Clearance: 69.3 mL/min (by C-G formula based on Cr of 0.57).    Allergies  Allergen Reactions  . Decadrol [Dexamethasone] Swelling    Swelling of face and stomach   . Sodium Phosphate Swelling    Swelling of face and stomach     Antimicrobials this admission: 2/2 >> Vanc >> 2/4 2/2 >> Zosyn >>  2/3 >> Azithromycin >> 2/5  Levels/dose changes this admission: None  Microbiology Results: 2/2 BCx: NGF 2/2 UCx: NGF  2/2 MRSA PCR (-) 2/2 Influenza panel (-)  Thank you for allowing pharmacy to be a part of this patient's care.  Lynann Beaver PharmD, BCPS Pager [phone] 08/15/2015 8:58 AM

## 2015-08-15 NOTE — Progress Notes (Signed)
    Subjective:  Denies CP or dyspnea   Objective:  Filed Vitals:   08/14/15 1358 08/14/15 1438 08/14/15 2051 08/15/15 0547  BP:  140/79 171/76 167/89  Pulse:  78 80 76  Temp:  99.1 F (37.3 C) 98.3 F (36.8 C) 98.5 F (36.9 C)  TempSrc:  Oral Oral Oral  Resp:  Height:      Weight: 215 lb 9.8 oz (97.8 kg)   204 lb 5.9 oz (92.7 kg)  SpO2:  100% 99% 99%    Intake/Output from previous day:  Intake/Output Summary (Last 24 hours) at 08/15/15 1048 Last data filed at 08/15/15 0839  Gross per 24 hour  Intake   1540 ml  Output   4200 ml  Net  -2660 ml    Physical Exam: Physical exam: Well-developed obese in no acute distress.  Skin is warm and dry.  HEENT is normal.  Neck is supple.  Chest is clear to auscultation with normal expansion.  Cardiovascular exam is regular rate and rhythm.  Abdominal exam nontender or distended. No masses palpated. Extremities show 2 + edema. neuro grossly intact    Lab Results: Basic Metabolic Panel:  Recent Labs  81/19/14 0536 08/15/15 0542  NA 140 141  K 3.6 4.2  CL 100* 98*  CO2 29 30  GLUCOSE 238* 204*  BUN 15 18  CREATININE 0.54 0.57  CALCIUM 9.4 9.6  MG  --  2.0   CBC:  Recent Labs  08/13/15 0606 08/14/15 0536 08/15/15 0542  WBC 9.7 9.5 11.5*  NEUTROABS 8.5*  --   --   HGB 13.4 13.6 14.6  HCT 43.1 44.0 47.5*  MCV 87.1 86.4 87.5  PLT 244 251 246   Cardiac Enzymes:  Recent Labs  08/12/15 1634  CKTOTAL 36*  CKMB 3.0     Assessment/Plan:  1 pericardial effusion-I have previously reviewed the patient's echocardiogram personally. I have also reviewed the patient's recent CT scan of her chest. There does not appear to be significant pericardial effusion on her CT scan and there appears to be significant epicardial fat on her echocardiogram and not effusion. I do not feel further evaluation is indicated. 2 paroxysmal atrial fibrillation-patient is noted to have asymptomatic paroxysmal atrial  fibrillation on telemetry. She remains in sinus rhythm this AM. Continue propranolol 30 mg by mouth 3 times a day. Note she is on this for a tremor but should also help with BP and HR is atrial fibrillation recurs. She has embolic risk factors of vascular disease, age greater than 24, diabetes mellitus, hypertension and female sex. Continue apixaban 5 mg BID. She will need follow-up hemoglobin and BUN/creatinine in 4 weeks. DC ASA given need for apixaban. 3 acute on chronic diastolic congestive heart failure-the patient remains volume overloaded on examination.  Would continue diuresis and follow renal function.  Other issues (sepsis, pulmonary nodules) per primary care. We will sign off; please call with questions; patient can FU with me in 8 weeks for cardiac issues.  Olga Millers 08/15/2015, 10:48 AM

## 2015-08-15 NOTE — Progress Notes (Signed)
Inpatient Diabetes Program Recommendations  AACE/ADA: New Consensus Statement on Inpatient Glycemic Control (2015)  Target Ranges:  Prepandial:   less than 140 mg/dL      Peak postprandial:   less than 180 mg/dL (1-2 hours)      Critically ill patients:  140 - 180 mg/dL   Review of Glycemic Control  Inpatient Diabetes Program Recommendations:    Noted lantus 24 units added to insulin regimen. Fasting glucose is improved, however glucose before lunch and supper and at HS are still high in 200's-300's. Please consider addition of low dose meal coverage of 3-4 units tidwc. (given only if patient eats at least 50% or greater).  Thank you Lenor Coffin, RN, MSN, CDE  Diabetes Inpatient Program Office: [phone] Pager: [phone] 8:00 am to 5:00 pm

## 2015-08-15 NOTE — Progress Notes (Signed)
[facility] PROGRESS NOTE  Suleima Ohlendorf ZOX:[id number] DOB: [birth year] DOA: 08/09/2015 PCP: [name], MD  Brief interval history  70 y.o. 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 Who came to the emergency department brought in via EMS with complaints of generalized abdominal pain, nausea, received 4 mg of Zofran by EMS, apparently has been constipated for 1 week, intermittent nausea but no vomiting. Patient son who provides most of the history states that she was on antibiotics 2 weeks ago for pneumonia, patient's oxygen saturation on room air was found to be 88%. She was also found to have a fever of 101.2, patient is somnolent and arousable to sternal lab but unable to provide any meaningful history. Upon exam the patient was found to be confused, 99% on 4 L of oxygen, ER initiated code sepsis with vancomycin, Zosyn, cultures, and small IV fluid bolus because of the patient's history of CHF. Lab work showed a lactate of 3.5, WBC 14.6, venous pH 7.35 with CO2 63, BNP 130. Chest x-ray negative acute. Based on reassuring chest x-ray and BNP, gave more fluid to total 30 mg/kg. attained CT to rule out acute intra-abdominal process. CT without acute findings to explain the patient's fever. UA indicates infection and I suspect a UTI is the source of the patient's sepsis, PCCM was called and , Dr. Kendrick Fries thought that the patient was appropriate for stepdown admission.     Assessment/Plan:  #1 sepsis Patient was admitted with sepsis criteria of fever, leukocytosis. Chest x-ray which was done was negative for pneumonia. Patient was also noted to have some hypoxia on admission. Patient was pancultured and placed empirically on IV antibiotics. Patient improved clinically. Patient's fever  defervesced. Urine cultures were negative. Blood cultures were negative. WBC trended down. Patient improved clinically and will finish a seven-day course of one about the treatment.  #2 pericardial effusion Cardiology was consulted. 2-D echo was done and per cardiology pericardial effusion does not appear to be significant effusion no further evaluation is needed.  #3 paroxysmal atrial fibrillation chads2vasc SCORE 5 Currently rate controlled on propranolol.  patient has been started on anticoagulation with eliquis per cardiology recommendations. Outpatient follow-up.  #4 acute on chronic diastolic CHF Likely secondary to fluid resuscitation on admission. Patient status post IV Lasix, and has been transitioned to oral torsemide per cardiology recommendations. Monitor renal function closely as well as electrolytes.  #5 hypertension Continue Norvasc, propranolol.   #6 acute hypoxia respiratory failure ?? etiology. CT angiogram chest negative for acute PE or pneumonia noted. 2-D echo had a pericardial effusion however the cardiology not significant for any further evaluation. Patient was placed empirically on IV antibiotics and will receive a seven-day course of treatment. Patient with clinical improvement. Quick steroid taper. Continue nebs. Follow.  #7 pulmonary nodules Outpatient follow-up. Patient will likely need repeat CT scan in 12 months.  #8 diabetes mellitus Hemoglobin A1c is 9.3. CBGs averaged from 173 08/09/1987. Continue current dose of Lantus. Continue sliding scale insulin. Will add milk coverage insulin 3 units 3 times a day with meals. Follow.  #9 prophylaxis Eliquis for DVT prophylaxis.   Code Status: Full Family Communication: Updated patient. No family at bedside. Disposition Plan: Home with home health services hopefully tomorrow 08/16/2015   Consultants:  Cardiology: Dr. Jens Som 08/14/2015  Procedures:  CT angiogram chest 08/11/2015  CT head  08/09/2015  CT abdomen and pelvis 08/09/2015  2-D echo 08/13/2015  Antibiotics:  IV vancomycin 08/09/2015>>>> 08/11/2015  IV Zosyn 08/09/2015  IV azithromycin 08/10/2015>>>>> 08/12/2015  HPI/Subjective: Patient denies any shortness of breath. No chest pain. Feeling better.  Objective: Filed Vitals:   08/14/15 2051 08/15/15 0547  BP: 171/76 167/89  Pulse: 80 76  Temp: 98.3 F (36.8 C) 98.5 F (36.9 C)  Resp: 20 18    Intake/Output Summary (Last 24 hours) at 08/15/15 1237 Last data filed at 08/15/15 1224  Gross per 24 hour  Intake   2000 ml  Output   6000 ml  Net  -4000 ml   Filed Weights   08/14/15 0554 08/14/15 1358 08/15/15 0547  Weight: 94.1 kg (207 lb 7.3 oz) 97.8 kg (215 lb 9.8 oz) 92.7 kg (204 lb 5.9 oz)    Exam:   General:  NAD  Cardiovascular: RRR  Respiratory: CTAB  Abdomen: Soft, nontender, nondistended, positive bowel sounds.  Musculoskeletal: No, cyanosis or edema.  Data Reviewed: Basic Metabolic Panel:  Recent Labs Lab 08/10/15 0930 08/11/15 0402 08/12/15 0344 08/13/15 0606 08/14/15 0536 08/15/15 0542  NA 139 138 138 141 140 141  K 4.8 4.8 3.8 3.7 3.6 4.2  CL 102 101 104 103 100* 98*  CO2 GLUCOSE 310* 312* 251* 232* 238* 204*  BUN CREATININE 0.72 0.69 0.55 0.60 0.54 0.57  CALCIUM 8.2* 8.6* 8.4* 9.1 9.4 9.6  MG 2.1  --   --   --   --  2.0  PHOS 3.1  --   --   --   --   --    Liver Function Tests:  Recent Labs Lab 08/09/15 1050 08/09/15 1838 08/10/15 0930 08/11/15 0402 08/13/15 0606  AST 51* 31  ALT 32 34 31 35 45  ALKPHOS 51 50 47 44 44  BILITOT 1.0 0.9 0.9 1.0 0.5  PROT 6.5 6.5 6.3* 6.6 6.6  ALBUMIN 3.6 3.6 3.3* 3.5 3.6    Recent Labs Lab 08/09/15 1050  LIPASE 20   No results for input(s): AMMONIA in the last 168 hours. CBC:  Recent Labs Lab 08/10/15 0930 08/11/15 0402 08/12/15 0344 08/13/15 0606 08/14/15 0536 08/15/15 0542  WBC 13.2* 11.3* 9.2 9.7 9.5  11.5*  NEUTROABS 12.1* 9.8* 7.9* 8.5*  --   --   HGB 13.3 12.8 12.8 13.4 13.6 14.6  HCT 42.3 41.0 40.7 43.1 44.0 47.5*  MCV 87.4 87.6 86.4 87.1 86.4 87.5  PLT 198 238 246 244 251 246   Cardiac Enzymes:  Recent Labs Lab 08/09/15 1838 08/09/15 2328 08/10/15 0510 08/11/15 1606 08/12/15 0924 08/12/15 1634  CKTOTAL  --   --   --  86  --  36*  CKMB  --   --   --  4.6  --  3.0  TROPONINI <0.03 <0.03 <0.03  --  <0.03  --    BNP (last 3 results)  Recent Labs  04/17/15 1043 04/30/15 1120 08/09/15 1050  BNP 77.7 46.4 130.8*    ProBNP (last 3 results) No results for input(s): PROBNP in the last 8760 hours.  CBG:  Recent Labs Lab 08/14/15 1201 08/14/15 1652 08/14/15 2048 08/15/15 0730 08/15/15 1139  GLUCAP 234* 191* 241* 183* 289*    Recent Results (from the past 240 hour(s))  Blood Culture (routine x 2)     Status: None   Collection Time: 08/09/15 10:50 AM  Result  Value Ref Range Status   Specimen Description BLOOD LEFT HAND  Final   Special Requests BOTTLES DRAWN AEROBIC AND ANAEROBIC 5CC  Final   Culture   Final    NO GROWTH 5 DAYS Performed at [facility]    Report Status 08/14/2015 FINAL  Final  Blood Culture (routine x 2)     Status: None   Collection Time: 08/09/15 10:50 AM  Result Value Ref Range Status   Specimen Description BLOOD LEFT ANTECUBITAL  Final   Special Requests BOTTLES DRAWN AEROBIC AND ANAEROBIC 5CC EA  Final   Culture   Final    NO GROWTH 5 DAYS Performed at [facility]    Report Status 08/14/2015 FINAL  Final  Urine culture     Status: None   Collection Time: 08/09/15  2:11 PM  Result Value Ref Range Status   Specimen Description URINE, CATHETERIZED  Final   Special Requests NONE  Final   Culture   Final    NO GROWTH 1 DAY Performed at [facility]    Report Status 08/10/2015 FINAL  Final  MRSA PCR Screening     Status: None   Collection Time: 08/09/15  6:08 PM  Result Value Ref Range Status   MRSA by  PCR NEGATIVE NEGATIVE Final    Comment:        The GeneXpert MRSA Assay (FDA approved for NASAL specimens only), is one component of a comprehensive MRSA colonization surveillance program. It is not intended to diagnose MRSA infection nor to guide or monitor treatment for MRSA infections.      Studies: No results found.  Scheduled Meds: . amLODipine  5 mg Oral Daily  . antiseptic oral rinse  7 mL Mouth Rinse q12n4p  . apixaban  5 mg Oral BID  . calcium-vitamin D  1 tablet Oral Q breakfast  . chlorhexidine  15 mL Mouth Rinse BID  . gabapentin  100 mg Oral TID  . insulin aspart  0-15 Units Subcutaneous TID WC  . insulin aspart  0-5 Units Subcutaneous QHS  . insulin glargine  24 Units Subcutaneous Daily  . ipratropium  0.5 mg Nebulization Once  . levalbuterol  1.25 mg Nebulization Once  . magnesium oxide  400 mg Oral BID  . methylPREDNISolone (SOLU-MEDROL) injection  40 mg Intravenous 3 times per day  . morphine  15 mg Oral Q12H  . pantoprazole  40 mg Oral Daily  . piperacillin-tazobactam (ZOSYN)  IV  3.375 g Intravenous Q8H  . potassium chloride SA  20 mEq Oral Daily  . propranolol  30 mg Oral TID  . saccharomyces boulardii  250 mg Oral BID  . sodium chloride flush  3 mL Intravenous Q12H  . tamsulosin  0.4 mg Oral QHS  . torsemide  20 mg Oral Daily   Continuous Infusions:   Principal Problem:   Sepsis (HCC) Active Problems:   UTI (lower urinary tract infection)   Metabolic encephalopathy   Diabetes (HCC)   Chronic pain disorder   Hypertension   Quadriplegia (HCC)   Lactic acidosis   Acute respiratory failure with hypoxia and hypercarbia (HCC)   Diarrhea   Pericardial effusion   PAF (paroxysmal atrial fibrillation) (HCC)   Acute on chronic diastolic CHF (congestive heart failure) (HCC)   Pulmonary nodules    Time spent: 35 mins    [facility] MD [facility] Pager [phone]. If 7PM-7AM, please contact night-coverage at [email or web address], password  [facility] 08/15/2015, 12:37 PM  LOS: 6 days

## 2015-08-16 LAB — BASIC METABOLIC PANEL
Anion gap: 9 (ref 5–15)
BUN: 23 mg/dL — AB (ref 6–20)
CALCIUM: 9.1 mg/dL (ref 8.9–10.3)
CHLORIDE: 93 mmol/L — AB (ref 101–111)
CO2: 37 mmol/L — ABNORMAL HIGH (ref 22–32)
CREATININE: 0.72 mg/dL (ref 0.44–1.00)
GFR calc non Af Amer: >60 mL/min (ref >60)
Glucose, Bld: 145 mg/dL — ABNORMAL HIGH (ref 65–99)
Potassium: 3.8 mmol/L (ref 3.5–5.1)
SODIUM: 139 mmol/L (ref 135–145)

## 2015-08-16 LAB — GLUCOSE, CAPILLARY
GLUCOSE-CAPILLARY: 120 mg/dL — AB (ref 65–99)
Glucose-Capillary: 174 mg/dL — ABNORMAL HIGH (ref 65–99)

## 2015-08-16 MED ORDER — PROPRANOLOL HCL 10 MG PO TABS: 30.0000 mg | ORAL_TABLET | Freq: Three times a day (TID) | ORAL | Status: AC

## 2015-08-16 MED ORDER — PREDNISONE 20 MG PO TABS: 40.0000 mg | ORAL_TABLET | Freq: Every day | ORAL | Status: DC

## 2015-08-16 MED ORDER — AMLODIPINE BESYLATE 5 MG PO TABS: 5.0000 mg | ORAL_TABLET | Freq: Every day | ORAL | Status: AC

## 2015-08-16 MED ORDER — PREDNISONE 5 MG PO TABS: 15.0000 mg | ORAL_TABLET | Freq: Every day | ORAL | Status: AC

## 2015-08-16 MED ORDER — LORAZEPAM 0.5 MG PO TABS
0.5000 mg | ORAL_TABLET | Freq: Two times a day (BID) | ORAL | Status: DC | PRN
  Administered 2015-08-16: 0.5 mg via ORAL
  Filled 2015-08-16: qty 1

## 2015-08-16 MED ORDER — APIXABAN 5 MG PO TABS: 5.0000 mg | ORAL_TABLET | Freq: Two times a day (BID) | ORAL | Status: AC

## 2015-08-16 MED ORDER — GLIMEPIRIDE 2 MG PO TABS: 2.0000 mg | ORAL_TABLET | Freq: Every day | ORAL | Status: AC

## 2015-08-16 NOTE — Discharge Summary (Signed)
Physician Discharge Summary  Any Mcneice ZOX:[id number] DOB: 22-Apr-1946 DOA: 08/09/2015  PCP: [name], MD  Admit date: 08/09/2015 Discharge date: 08/16/2015  Time spent: 65 minutes  Recommendations for Outpatient Follow-up:  1. Follow up with [name], MD in 1-2 weeks. Will need BMET on follow up. 2. Follow up with Dr Jens Som in 2 months.   Discharge Diagnoses:  Principal Problem:   Sepsis (HCC) Active Problems:   UTI (lower urinary tract infection)   Metabolic encephalopathy   Diabetes (HCC)   Chronic pain disorder   Hypertension   Quadriplegia (HCC)   Lactic acidosis   Acute respiratory failure with hypoxia and hypercarbia (HCC)   Diarrhea   Pericardial effusion   PAF (paroxysmal atrial fibrillation) (HCC)   Acute on chronic diastolic CHF (congestive heart failure) (HCC)   Pulmonary nodules   Type 2 diabetes, HbA1c goal < 7% (HCC)   Discharge Condition: stable and improved.  Diet recommendation: Carb modified diet.  Filed Weights   08/14/15 1358 08/15/15 0547 08/16/15 0435  Weight: 97.8 kg (215 lb 9.8 oz) 92.7 kg (204 lb 5.9 oz) 91.4 kg (201 lb 8 oz)    History of present illness:  Per Dr Susie Cassette 70 y.o. 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 Who came to the emergency department brought in via EMS with complaints of generalized abdominal pain, nausea, received 4 mg of Zofran by EMS, apparently has been constipated for 1 week, intermittent nausea but no vomiting. Patient son who provided most of the history stated that she was on antibiotics 2 weeks ago for pneumonia, patient's oxygen saturation on room air was found to be 88%. She was also found to have a fever of 101.2, patient is somnolent and arousable to sternal lab but unable to provide any meaningful  history. Upon exam the patient was found to be confused, 99% on 4 L of oxygen, ER initiated code sepsis with vancomycin, Zosyn, cultures, and small IV fluid bolus because of the patient's history of CHF. Lab work showed a lactate of 3.5, WBC 14.6, venous pH 7.35 with CO2 63, BNP 130. Chest x-ray negative acute. Based on reassuring chest x-ray and BNP, gave more fluid to total 30 mg/kg. attained CT to rule out acute intra-abdominal process. CT without acute findings to explain the patient's fever. UA indicates infection and I suspect a UTI is the source of the patient's sepsis, PCCM was called and , Dr. Kendrick Fries thought that the patient was appropriate for stepdown admission.  Hospital Course:  #1 sepsis Patient was admitted with sepsis criteria of fever, leukocytosis. Chest x-ray which was done was negative for pneumonia. Patient was also noted to have some hypoxia on admission. Patient was pancultured and placed empirically on IV antibiotics. Patient improved clinically. Patient's fever defervesced. Urine cultures were negative. Blood cultures were negative. WBC trended down. Patient improved clinically and will finish a seven-day course of antibiotic treatment.  #2 pericardial effusion Cardiology was consulted. 2-D echo was done and per cardiology pericardial effusion does not appear to be significant effusion no further evaluation was needed.  #3 paroxysmal atrial fibrillation chads2vasc SCORE 5 Rate controlled on propranolol. Patient has been started on anticoagulation with eliquis per cardiology recommendations. Outpatient follow-up.  #4 acute on chronic diastolic CHF Likely secondary to fluid resuscitation on admission. Patient status post IV Lasix, and has been transitioned to  oral torsemide per cardiology recommendations. Outpatient follow up.  #5 hypertension Continued on Norvasc, propranolol.   #6 acute hypoxia respiratory failure ?? etiology. CT angiogram chest negative for acute PE  or pneumonia noted. 2-D echo had a pericardial effusion however the cardiology not significant for any further evaluation. Patient was placed empirically on IV antibiotics and received a seven-day course of treatment. Patient with clinical improvement. Quick steroid taper. Continue nebs. Follow.  #7 pulmonary nodules Outpatient follow-up. Patient will likely need repeat CT scan in 12 months.  #8 diabetes mellitus Hemoglobin A1c is 9.3. Patient was maintained on Lantus and SSI.    Procedures:  CT angiogram chest 08/11/2015  CT head 08/09/2015  CT abdomen and pelvis 08/09/2015  2-D echo 08/13/2015  Consultations:  Cardiology: Dr. Jens Som 08/14/2015  Discharge Exam: Filed Vitals:   08/15/15 2058 08/16/15 0410  BP: 154/79 137/69  Pulse: 63 62  Temp: 98.7 F (37.1 C) 98 F (36.7 C)  Resp: 20 18    General: NAD Cardiovascular: RRR Respiratory: CTAB  Discharge Instructions   Discharge Instructions    Diet - low sodium heart healthy    Complete by:  As directed      Discharge instructions    Complete by:  As directed   Follow up with [name], MD in 1-2 weeks. Follow up with Dr Jens Som, cardiology in 8 weeks.     Increase activity slowly    Complete by:  As directed           Current Discharge Medication List    START taking these medications   Details  amLODipine (NORVASC) 5 MG tablet Take 1 tablet (5 mg total) by mouth daily. Qty: 30 tablet, Refills: 0    apixaban (ELIQUIS) 5 MG TABS tablet Take 1 tablet (5 mg total) by mouth 2 (two) times daily. Qty: 60 tablet, Refills: 6      CONTINUE these medications which have CHANGED   Details  glimepiride (AMARYL) 2 MG tablet Take 1 tablet (2 mg total) by mouth daily with breakfast. Qty: 45 tablet, Refills: 1    !! predniSONE (DELTASONE) 20 MG tablet Take 2 tablets (40 mg total) by mouth daily before breakfast. Take 2 tablets (40mg ) daily x 2 days, then 1 tablet (20mg ) daily x 2 days then back to home  dose of 15mg  daily. Qty: 6 tablet, Refills: 0    !! predniSONE (DELTASONE) 5 MG tablet Take 3 tablets (15 mg total) by mouth daily with breakfast. Resume after taper.    propranolol (INDERAL) 10 MG tablet Take 3 tablets (30 mg total) by mouth 3 (three) times daily. Qty: 270 tablet, Refills: 6     !! - Potential duplicate medications found. Please discuss with provider.    CONTINUE these medications which have NOT CHANGED   Details  acetaminophen (TYLENOL) 325 MG tablet Take 2 tablets (650 mg total) by mouth every 6 (six) hours as needed for mild pain (or Fever >/= 101). Qty: 30 tablet, Refills: 0    baclofen (LIORESAL) 10 MG tablet Take 1 tablet (10 mg total) by mouth 2 (two) times daily. Qty: 30 each, Refills: 0    calcium-vitamin D (OSCAL WITH D) 500-200 MG-UNIT per tablet Take 1 tablet by mouth daily with breakfast.    gabapentin (NEURONTIN) 300 MG capsule Take 300 mg by mouth 3 (three) times daily.    LORazepam (ATIVAN) 1 MG tablet Take 0.5 tablets (0.5 mg total) by mouth every 12 (twelve) hours as needed for anxiety.  Qty: 20 tablet, Refills: 0    magnesium oxide (MAG-OX) 400 MG tablet Take 400 mg by mouth 2 (two) times daily.    Multiple Vitamins-Minerals ([employer] SPECTRAVITE ADULT 50+ PO) Take 1 tablet by mouth daily.    omeprazole (PRILOSEC) 20 MG capsule Take 2 capsules (40 mg total) by mouth daily. Qty: 60 capsule, Refills: 1    oxymorphone (OPANA ER) 20 MG 12 hr tablet Take 1 tablet (20 mg total) by mouth every 12 (twelve) hours. Qty: 10 tablet, Refills: 0    potassium chloride SA (K-DUR,KLOR-CON) 20 MEQ tablet Take 1 tablet (20 mEq total) by mouth daily. Qty: 20 tablet, Refills: 0    rOPINIRole (REQUIP) 0.5 MG tablet Take 0.5 mg by mouth 2 (two) times daily. Give 1 tablet 1-3 hours before bedtime (around 1900) and than 1 tablet at bedtime (around 2200)    rosuvastatin (CRESTOR) 10 MG tablet Take 10 mg by mouth daily. Refills: 4    saccharomyces boulardii (FLORASTOR)  250 MG capsule Take 1 capsule (250 mg total) by mouth 2 (two) times daily. Qty: 60 capsule, Refills: 0    tamsulosin (FLOMAX) 0.4 MG CAPS capsule Take 0.4 mg by mouth at bedtime.     torsemide (DEMADEX) 20 MG tablet Take 1 tablet (20 mg total) by mouth daily. Qty: 30 tablet, Refills: 0    traZODone (DESYREL) 50 MG tablet Take 50 mg by mouth at bedtime. Refills: 5    trifluoperazine (STELAZINE) 5 MG tablet Take 5 mg by mouth 2 (two) times daily as needed (for itching).      STOP taking these medications     aspirin EC 81 MG tablet      sulfamethoxazole-trimethoprim (BACTRIM DS,SEPTRA DS) 800-160 MG tablet        Allergies  Allergen Reactions  . Decadrol [Dexamethasone] Swelling    Swelling of face and stomach   . Sodium Phosphate Swelling    Swelling of face and stomach    Follow-up Information    Follow up with [name], MD. Schedule an appointment as soon as possible for a visit in 1 week.   Specialty:  Family Medicine   Why:  f/u 1-2 weeks.   Contact information:   [address] [place] [address] [phone]       Follow up with Olga Millers, MD. Schedule an appointment as soon as possible for a visit in 2 months.   Specialty:  Cardiology   Contact information:   [address] [place] [address] [phone]        The results of significant diagnostics from this hospitalization (including imaging, microbiology, ancillary and laboratory) are listed below for reference.    Significant Diagnostic Studies: Dg Chest 2 View  08/09/2015  CLINICAL DATA:  Generalized weakness, fever EXAM: CHEST  2 VIEW COMPARISON:  04/30/2015 FINDINGS: Borderline cardiomegaly. No infiltrate or pulmonary edema. Bony thorax is unremarkable. IMPRESSION: No active cardiopulmonary disease.  Borderline cardiomegaly. Electronically Signed   By: Natasha Mead M.D.   On: 08/09/2015 11:41   Ct Head Wo Contrast  08/09/2015  CLINICAL DATA:  Altered mental status, headache,  recent pneumonia, hypertension, quadriplegia, history CHF EXAM: CT HEAD WITHOUT CONTRAST TECHNIQUE: Contiguous axial images were obtained from the base of the skull through the vertex without intravenous contrast. COMPARISON:  04/17/2015 FINDINGS: Mild generalized atrophy. Normal ventricular morphology. No midline shift or mass effect. Otherwise normal appearance of brain parenchyma. No intracranial hemorrhage, mass lesion or evidence acute infarction. No extra-axial fluid  collection. Sinuses clear and bones unremarkable. Atherosclerotic calcifications at the carotid siphons. IMPRESSION: No acute intracranial abnormalities. Electronically Signed   By: Ulyses Southward M.D.   On: 08/09/2015 17:32   Ct Angio Chest Pe W/cm &/or Wo Cm  08/11/2015  CLINICAL DATA:  Hypoxia and possible aspiration. Evaluate for pulmonary embolism. Patient is quadriplegic. EXAM: CT ANGIOGRAPHY CHEST WITH CONTRAST TECHNIQUE: Multidetector CT imaging of the chest was performed using the standard protocol during bolus administration of intravenous contrast. Multiplanar CT image reconstructions and MIPs were obtained to evaluate the vascular anatomy. CONTRAST:  OMNIPAQUE IOHEXOL 350 MG/ML SOLN COMPARISON:  04/07/2015 FINDINGS: Mediastinum/Lymph Nodes: Negative for pulmonary embolism. Normal caliber of the thoracic aorta without dissection. Mild atherosclerotic plaque at the aortic arch. Great vessels are patent. No significant chest lymphadenopathy. There are coronary artery calcifications. No significant pericardial fluid. Lungs/Pleura: Trachea and mainstem bronchi are patent. Again noted are a few dependent and basilar lung densities which are similar to the previous examination and probably represent a combination of scarring and atelectasis. There is a 5 mm subtle density in the right upper lobe. This nodular structure has a flat configuration on the coronal and sagittal reformats. In addition, there is probably a small focus of calcium  within this structure on based on the soft tissue windows. This structure is present on sequence 7, image 36. There is also punctate nodular density along the right minor fissure which is stable on sequence 7, image 44. Stable 5 mm nodular density in the right lower lobe superior segment on sequence 7, image 47. There is mild pleural thickening, particularly on left side. No significant pleural effusions. Upper abdomen: Low-attenuation of the liver is suggestive for hepatic steatosis. No acute abnormalities in the upper abdomen. Musculoskeletal: No acute bone abnormality. Review of the MIP images confirms the above findings. IMPRESSION: No acute chest abnormality. Negative for a pulmonary embolism. No evidence for an aortic dissection. Small pulmonary nodules as described. No significantly changed since 04/17/2015. At least 1 of these nodules may represent a calcified granuloma. Largest nodule measures up to 5 mm. If the patient is at high risk for bronchogenic carcinoma, follow-up chest CT at 6-12 months is recommended. If the patient is at low risk for bronchogenic carcinoma, follow-up chest CT at 12 months is recommended. This recommendation follows the consensus statement: Guidelines for Management of Small Pulmonary Nodules Detected on CT Scans: A Statement from the Fleischner Society as published in Radiology 2005;237:395-400. Electronically Signed   By: Richarda Overlie M.D.   On: 08/11/2015 09:20   Ct Abdomen Pelvis W Contrast  08/09/2015  CLINICAL DATA:  70 year old long-time paraplegic due to remote spinal cord injury, presenting with acute onset of generalized abdominal pain and nausea which began yesterday. Patient diagnosed with pneumonia approximately 2 weeks ago for which she is completing antibiotic therapy. EXAM: CT ABDOMEN AND PELVIS WITH CONTRAST TECHNIQUE: Multidetector CT imaging of the abdomen and pelvis was performed using the standard protocol following bolus administration of intravenous  contrast. CONTRAST:  OMNIPAQUE IOHEXOL 300 MG/ML IV. Oral contrast was also administered. COMPARISON:  04/24/2015, 03/18/2007. FINDINGS: Respiratory motion blurs images of the lower chest and upper abdomen. The study appears diagnostic, however. Lower chest: Linear scarring in the lower lobes, unchanged. Visualized lung bases otherwise clear. The 4 mm left lower lobe nodule identified on the October, 2016 examination was not imaged today. Heart mildly to moderately enlarged with a very large amount of epicardial fat as noted previously. Hepatobiliary: Mild hepatomegaly  with diffuse hepatic steatosis with focal sparing surrounding the gallbladder and scattered areas of focal sparing peripherally in both lobes, less severe than on the October, 2016 examination. No significant focal hepatic parenchymal abnormality. Gallbladder normal in appearance without calcified gallstones. No biliary ductal dilation. Pancreas: Normal in appearance without evidence of mass, ductal dilation, or inflammation. Spleen: Normal in size and appearance. Adrenals/Urinary Tract: Normal appearing adrenal glands. Numerous cortical cysts involving both kidneys, the largest arising from the lower pole of the left kidney measuring approximately 4.5 cm. All mild diffuse cortical thinning involving both kidneys as noted previously. Atherosclerotic calcification involving the distal right main renal artery at the right renal hilus. No hydronephrosis. No urinary tract calculi. Urinary bladder unremarkable. Stomach/Bowel: Stomach normal in appearance for the degree of distention. Normal-appearing small bowel. Descending and sigmoid colon diverticulosis without evidence of acute diverticulitis. Moderate colonic stool burden. Surgical clips at the cecum from prior appendectomy. 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 Visceral arteries patent though atherosclerotic. No pathologic lymphadenopathy. Reproductive:  Uterus and ovaries surgically absent. No adnexal masses. Other: Atrophic paraspinous muscles and visualized lower extremity muscles indicating long-standing tissues. Musculoskeletal: Osseous demineralization. Degenerative disc disease at L3-4 L4-5. Degenerative retrolisthesis of L4 relative to L5. Severe diffuse facet degenerative changes involving the lumbar spine. No acute abnormality. IMPRESSION: 1. No acute abnormalities involving the abdomen or pelvis. 2. Descending and sigmoid colon diverticulosis without evidence of acute diverticulitis. 3. Mild hepatomegaly with diffuse hepatic steatosis. Focal sparing surrounding the gallbladder and peripherally in both hepatic lobes. No significant focal hepatic parenchymal abnormality. Steatosis is less severe than on the October, 2016 CT. 4. 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 Electronically Signed   By: Hulan Saas M.D.   On: 08/09/2015 13:32   Dg Chest Port 1 View  08/10/2015  CLINICAL DATA:  Shortness of Breath EXAM: PORTABLE CHEST 1 VIEW COMPARISON:  08/09/2015 FINDINGS: Cardiac shadow is mildly enlarged. The lungs are well aerated bilaterally. No focal infiltrate or sizable effusion is seen. No bony abnormality is noted. IMPRESSION: No active disease. Electronically Signed   By: Alcide Clever M.D.   On: 08/10/2015 09:10    Microbiology: Recent Results (from the past 240 hour(s))  Blood Culture (routine x 2)     Status: None   Collection Time: 08/09/15 10:50 AM  Result Value Ref Range Status   Specimen Description BLOOD LEFT HAND  Final   Special Requests BOTTLES DRAWN AEROBIC AND ANAEROBIC 5CC  Final   Culture   Final    NO GROWTH 5 DAYS Performed at [facility]    Report Status 08/14/2015 FINAL  Final  Blood Culture (routine x 2)     Status: None   Collection Time: 08/09/15 10:50 AM  Result Value Ref Range Status   Specimen Description BLOOD LEFT ANTECUBITAL  Final   Special Requests BOTTLES DRAWN  AEROBIC AND ANAEROBIC 5CC EA  Final   Culture   Final    NO GROWTH 5 DAYS Performed at [facility]    Report Status 08/14/2015 FINAL  Final  Urine culture     Status: None   Collection Time: 08/09/15  2:11 PM  Result Value Ref Range Status   Specimen Description URINE, CATHETERIZED  Final   Special Requests NONE  Final   Culture   Final    NO GROWTH 1 DAY Performed at [facility]    Report Status 08/10/2015 FINAL  Final  MRSA PCR Screening     Status:  None   Collection Time: 08/09/15  6:08 PM  Result Value Ref Range Status   MRSA by PCR NEGATIVE NEGATIVE Final    Comment:        The GeneXpert MRSA Assay (FDA approved for NASAL specimens only), is one component of a comprehensive MRSA colonization surveillance program. It is not intended to diagnose MRSA infection nor to guide or monitor treatment for MRSA infections.      Labs: Basic Metabolic Panel:  Recent Labs Lab 08/10/15 0930  08/12/15 0344 08/13/15 0606 08/14/15 0536 08/15/15 0542 08/16/15 0453  NA 139  < > 138 141 140 141 139  K 4.8  < > 3.8 3.7 3.6 4.2 3.8  CL 102  < > 104 103 100* 98* 93*  CO2 27  < > 25 29 29 30  37*  GLUCOSE 310*  < > 251* 232* 238* 204* 145*  BUN 12  < > 12 13 15 18  23*  CREATININE 0.72  < > 0.55 0.60 0.54 0.57 0.72  CALCIUM 8.2*  < > 8.4* 9.1 9.4 9.6 9.1  MG 2.1  --   --   --   --  2.0  --   PHOS 3.1  --   --   --   --   --   --   < > = values in this interval not displayed. Liver Function Tests:  Recent Labs Lab 08/09/15 1838 08/10/15 0930 08/11/15 0402 08/13/15 0606  AST 28 27 51* 31  ALT 34 31 35 45  ALKPHOS 50 47 44 44  BILITOT 0.9 0.9 1.0 0.5  PROT 6.5 6.3* 6.6 6.6  ALBUMIN 3.6 3.3* 3.5 3.6   No results for input(s): LIPASE, AMYLASE in the last 168 hours. No results for input(s): AMMONIA in the last 168 hours. CBC:  Recent Labs Lab 08/10/15 0930 08/11/15 0402 08/12/15 0344 08/13/15 0606 08/14/15 0536 08/15/15 0542  WBC 13.2* 11.3* 9.2  9.7 9.5 11.5*  NEUTROABS 12.1* 9.8* 7.9* 8.5*  --   --   HGB 13.3 12.8 12.8 13.4 13.6 14.6  HCT 42.3 41.0 40.7 43.1 44.0 47.5*  MCV 87.4 87.6 86.4 87.1 86.4 87.5  PLT 198 238 246 244 251 246   Cardiac Enzymes:  Recent Labs Lab 08/09/15 1838 08/09/15 2328 08/10/15 0510 08/11/15 1606 08/12/15 0924 08/12/15 1634  CKTOTAL  --   --   --  86  --  36*  CKMB  --   --   --  4.6  --  3.0  TROPONINI <0.03 <0.03 <0.03  --  <0.03  --    BNP: BNP (last 3 results)  Recent Labs  04/17/15 1043 04/30/15 1120 08/09/15 1050  BNP 77.7 46.4 130.8*    ProBNP (last 3 results) No results for input(s): PROBNP in the last 8760 hours.  CBG:  Recent Labs Lab 08/15/15 0730 08/15/15 1139 08/15/15 1629 08/15/15 2050 08/16/15 0739  GLUCAP 183* 289* 173* 242* 120*       Signed:  Patricia Fargo MD.  [facility] 08/16/2015, 1:06 PM

## 2015-08-16 NOTE — Progress Notes (Signed)
08/16/15  1410  Reviewed discharge instructions with patient. Patient verbalized understanding of discharge instructions. Copy of discharge instructions and prescriptions given to patients son.

## 2015-08-18 ENCOUNTER — Emergency Department (HOSPITAL_COMMUNITY): Payer: Medicare Other

## 2015-08-18 ENCOUNTER — Encounter (HOSPITAL_COMMUNITY): Payer: Self-pay | Admitting: *Deleted

## 2015-08-18 ENCOUNTER — Inpatient Hospital Stay (HOSPITAL_COMMUNITY)
Admission: EM | Admit: 2015-08-18 | Discharge: 2015-08-21 | DRG: 871 | Disposition: A | Discharge status: Home / Self Care | Payer: Medicare Other | Attending: Internal Medicine | Admitting: Internal Medicine

## 2015-08-18 DIAGNOSIS — B373 Candidiasis of vulva and vagina: Secondary | ICD-10-CM | POA: Diagnosis present

## 2015-08-18 DIAGNOSIS — R4182 Altered mental status, unspecified: Secondary | ICD-10-CM | POA: Diagnosis present

## 2015-08-18 DIAGNOSIS — G8929 Other chronic pain: Secondary | ICD-10-CM | POA: Diagnosis present

## 2015-08-18 DIAGNOSIS — T380X5A Adverse effect of glucocorticoids and synthetic analogues, initial encounter: Secondary | ICD-10-CM | POA: Diagnosis present

## 2015-08-18 DIAGNOSIS — I480 Paroxysmal atrial fibrillation: Secondary | ICD-10-CM | POA: Diagnosis present

## 2015-08-18 DIAGNOSIS — E119 Type 2 diabetes mellitus without complications: Secondary | ICD-10-CM

## 2015-08-18 DIAGNOSIS — G894 Chronic pain syndrome: Secondary | ICD-10-CM | POA: Diagnosis present

## 2015-08-18 DIAGNOSIS — Z7901 Long term (current) use of anticoagulants: Secondary | ICD-10-CM

## 2015-08-18 DIAGNOSIS — J9621 Acute and chronic respiratory failure with hypoxia: Secondary | ICD-10-CM | POA: Diagnosis present

## 2015-08-18 DIAGNOSIS — I5032 Chronic diastolic (congestive) heart failure: Secondary | ICD-10-CM | POA: Diagnosis present

## 2015-08-18 DIAGNOSIS — Z87440 Personal history of urinary (tract) infections: Secondary | ICD-10-CM

## 2015-08-18 DIAGNOSIS — Z7952 Long term (current) use of systemic steroids: Secondary | ICD-10-CM

## 2015-08-18 DIAGNOSIS — K625 Hemorrhage of anus and rectum: Secondary | ICD-10-CM

## 2015-08-18 DIAGNOSIS — K649 Unspecified hemorrhoids: Secondary | ICD-10-CM | POA: Diagnosis present

## 2015-08-18 DIAGNOSIS — A419 Sepsis, unspecified organism: Principal | ICD-10-CM | POA: Diagnosis present

## 2015-08-18 DIAGNOSIS — L309 Dermatitis, unspecified: Secondary | ICD-10-CM | POA: Diagnosis present

## 2015-08-18 DIAGNOSIS — G9341 Metabolic encephalopathy: Secondary | ICD-10-CM | POA: Diagnosis present

## 2015-08-18 DIAGNOSIS — J9601 Acute respiratory failure with hypoxia: Secondary | ICD-10-CM | POA: Diagnosis present

## 2015-08-18 DIAGNOSIS — K219 Gastro-esophageal reflux disease without esophagitis: Secondary | ICD-10-CM | POA: Diagnosis present

## 2015-08-18 DIAGNOSIS — E785 Hyperlipidemia, unspecified: Secondary | ICD-10-CM | POA: Diagnosis present

## 2015-08-18 DIAGNOSIS — E1165 Type 2 diabetes mellitus with hyperglycemia: Secondary | ICD-10-CM | POA: Diagnosis present

## 2015-08-18 DIAGNOSIS — Z79891 Long term (current) use of opiate analgesic: Secondary | ICD-10-CM

## 2015-08-18 DIAGNOSIS — Z888 Allergy status to other drugs, medicaments and biological substances status: Secondary | ICD-10-CM | POA: Diagnosis not present

## 2015-08-18 DIAGNOSIS — Z79899 Other long term (current) drug therapy: Secondary | ICD-10-CM

## 2015-08-18 DIAGNOSIS — E876 Hypokalemia: Secondary | ICD-10-CM | POA: Diagnosis present

## 2015-08-18 DIAGNOSIS — Z87891 Personal history of nicotine dependence: Secondary | ICD-10-CM

## 2015-08-18 DIAGNOSIS — I4891 Unspecified atrial fibrillation: Secondary | ICD-10-CM | POA: Diagnosis present

## 2015-08-18 DIAGNOSIS — F1190 Opioid use, unspecified, uncomplicated: Secondary | ICD-10-CM | POA: Diagnosis not present

## 2015-08-18 DIAGNOSIS — G9340 Encephalopathy, unspecified: Secondary | ICD-10-CM | POA: Diagnosis present

## 2015-08-18 DIAGNOSIS — J189 Pneumonia, unspecified organism: Secondary | ICD-10-CM

## 2015-08-18 DIAGNOSIS — G8250 Quadriplegia, unspecified: Secondary | ICD-10-CM | POA: Diagnosis present

## 2015-08-18 DIAGNOSIS — E872 Acidosis: Secondary | ICD-10-CM | POA: Diagnosis present

## 2015-08-18 DIAGNOSIS — R410 Disorientation, unspecified: Secondary | ICD-10-CM | POA: Diagnosis not present

## 2015-08-18 DIAGNOSIS — I10 Essential (primary) hypertension: Secondary | ICD-10-CM | POA: Diagnosis present

## 2015-08-18 DIAGNOSIS — J9622 Acute and chronic respiratory failure with hypercapnia: Secondary | ICD-10-CM | POA: Diagnosis present

## 2015-08-18 DIAGNOSIS — T402X1S Poisoning by other opioids, accidental (unintentional), sequela: Secondary | ICD-10-CM | POA: Diagnosis not present

## 2015-08-18 LAB — I-STAT CG4 LACTIC ACID, ED
Lactic Acid, Venous: 1.59 mmol/L (ref 0.5–2.0)
Lactic Acid, Venous: 3.27 mmol/L (ref 0.5–2.0)

## 2015-08-18 LAB — CBG MONITORING, ED: GLUCOSE-CAPILLARY: 124 mg/dL — AB (ref 65–99)

## 2015-08-18 LAB — BLOOD GAS, ARTERIAL
Acid-Base Excess: 9.8 mmol/L — ABNORMAL HIGH (ref 0.0–2.0)
BICARBONATE: 40.2 meq/L — AB (ref 20.0–24.0)
Drawn by: 103701
FIO2: 1
O2 Saturation: 98.3 %
PCO2 ART: 94.6 mmHg — AB (ref 35.0–45.0)
PH ART: 7.251 — AB (ref 7.350–7.450)
Patient temperature: 98.6
TCO2: 37.5 mmol/L (ref 0–100)
pO2, Arterial: 186 mmHg — ABNORMAL HIGH (ref 80.0–100.0)

## 2015-08-18 LAB — PROTIME-INR
INR: 1.32 (ref 0.00–1.49)
Prothrombin Time: 16.5 seconds — ABNORMAL HIGH (ref 11.6–15.2)

## 2015-08-18 LAB — BLOOD GAS, VENOUS
ACID-BASE EXCESS: 12.1 mmol/L — AB (ref 0.0–2.0)
Bicarbonate: 44.2 mEq/L — ABNORMAL HIGH (ref 20.0–24.0)
O2 Saturation: 61.4 %
PCO2 VEN: 105 mmHg — AB (ref 45.0–50.0)
PH VEN: 7.249 — AB (ref 7.250–7.300)
PO2 VEN: 41.7 mmHg (ref 30.0–45.0)
Patient temperature: 98.6
TCO2: 40.8 mmol/L (ref 0–100)

## 2015-08-18 LAB — COMPREHENSIVE METABOLIC PANEL
ALBUMIN: 3.3 g/dL — AB (ref 3.5–5.0)
ALK PHOS: 56 U/L (ref 38–126)
ALT: 87 U/L — ABNORMAL HIGH (ref 14–54)
ANION GAP: 9 (ref 5–15)
AST: 96 U/L — ABNORMAL HIGH (ref 15–41)
BILIRUBIN TOTAL: 0.7 mg/dL (ref 0.3–1.2)
BUN: 23 mg/dL — AB (ref 6–20)
CALCIUM: 8.5 mg/dL — AB (ref 8.9–10.3)
CO2: 41 mmol/L — ABNORMAL HIGH (ref 22–32)
CREATININE: 0.63 mg/dL (ref 0.44–1.00)
Chloride: 92 mmol/L — ABNORMAL LOW (ref 101–111)
GFR calc Af Amer: >60 mL/min (ref >60)
GFR calc non Af Amer: >60 mL/min (ref >60)
GLUCOSE: 160 mg/dL — AB (ref 65–99)
Potassium: 4.2 mmol/L (ref 3.5–5.1)
Sodium: 142 mmol/L (ref 135–145)
TOTAL PROTEIN: 5.8 g/dL — AB (ref 6.5–8.1)

## 2015-08-18 LAB — URINALYSIS, ROUTINE W REFLEX MICROSCOPIC
BILIRUBIN URINE: NEGATIVE
Glucose, UA: NEGATIVE mg/dL
Hgb urine dipstick: NEGATIVE
KETONES UR: NEGATIVE mg/dL
LEUKOCYTES UA: NEGATIVE
NITRITE: NEGATIVE
PROTEIN: NEGATIVE mg/dL
Specific Gravity, Urine: 1.014 (ref 1.005–1.030)
pH: 7 (ref 5.0–8.0)

## 2015-08-18 LAB — ABO/RH: ABO/RH(D): A NEG

## 2015-08-18 LAB — APTT: aPTT: 29 seconds (ref 24–37)

## 2015-08-18 LAB — CBC WITH DIFFERENTIAL/PLATELET
BASOS ABS: 0 10*3/uL (ref 0.0–0.1)
BASOS PCT: 0 %
EOS ABS: 0.3 10*3/uL (ref 0.0–0.7)
EOS PCT: 2 %
HCT: 45 % (ref 36.0–46.0)
Hemoglobin: 13.4 g/dL (ref 12.0–15.0)
Lymphocytes Relative: 12 %
Lymphs Abs: 2.1 10*3/uL (ref 0.7–4.0)
MCH: 27.2 pg (ref 26.0–34.0)
MCHC: 29.8 g/dL — ABNORMAL LOW (ref 30.0–36.0)
MCV: 91.5 fL (ref 78.0–100.0)
MONO ABS: 1.1 10*3/uL — AB (ref 0.1–1.0)
Monocytes Relative: 6 %
NEUTROS ABS: 14 10*3/uL — AB (ref 1.7–7.7)
Neutrophils Relative %: 80 %
PLATELETS: 242 10*3/uL (ref 150–400)
RBC: 4.92 MIL/uL (ref 3.87–5.11)
RDW: 15.3 % (ref 11.5–15.5)
WBC: 17.5 10*3/uL — ABNORMAL HIGH (ref 4.0–10.5)

## 2015-08-18 LAB — RAPID URINE DRUG SCREEN, HOSP PERFORMED
Amphetamines: NOT DETECTED
BARBITURATES: NOT DETECTED
BENZODIAZEPINES: NOT DETECTED
COCAINE: NOT DETECTED
OPIATES: POSITIVE — AB
TETRAHYDROCANNABINOL: NOT DETECTED

## 2015-08-18 LAB — LACTIC ACID, PLASMA: Lactic Acid, Venous: 2.1 mmol/L (ref 0.5–2.0)

## 2015-08-18 LAB — SALICYLATE LEVEL

## 2015-08-18 LAB — TYPE AND SCREEN
ABO/RH(D): A NEG
ANTIBODY SCREEN: NEGATIVE

## 2015-08-18 LAB — PROCALCITONIN: Procalcitonin: <0.1 ng/mL

## 2015-08-18 LAB — POC OCCULT BLOOD, ED: Fecal Occult Bld: POSITIVE — AB

## 2015-08-18 LAB — BRAIN NATRIURETIC PEPTIDE: B NATRIURETIC PEPTIDE 5: 166.4 pg/mL — AB (ref 0.0–100.0)

## 2015-08-18 LAB — MRSA PCR SCREENING: MRSA by PCR: NEGATIVE

## 2015-08-18 LAB — ACETAMINOPHEN LEVEL: Acetaminophen (Tylenol), Serum: <10 ug/mL — ABNORMAL LOW (ref 10–30)

## 2015-08-18 LAB — ETHANOL

## 2015-08-18 MED ORDER — IPRATROPIUM BROMIDE 0.02 % IN SOLN
1.0000 mg | Freq: Once | RESPIRATORY_TRACT | Status: AC
  Administered 2015-08-18: 1 mg via RESPIRATORY_TRACT
  Filled 2015-08-18: qty 5

## 2015-08-18 MED ORDER — PIPERACILLIN-TAZOBACTAM 3.375 G IVPB
3.3750 g | Freq: Three times a day (TID) | INTRAVENOUS | Status: DC
  Administered 2015-08-18 – 2015-08-20 (×6): 3.375 g via INTRAVENOUS
  Filled 2015-08-18 (×5): qty 50

## 2015-08-18 MED ORDER — ALBUTEROL (5 MG/ML) CONTINUOUS INHALATION SOLN
15.0000 mg/h | INHALATION_SOLUTION | Freq: Once | RESPIRATORY_TRACT | Status: AC
  Administered 2015-08-18: 15 mg/h via RESPIRATORY_TRACT
  Filled 2015-08-18: qty 20

## 2015-08-18 MED ORDER — SODIUM CHLORIDE 0.9 % IV SOLN
80.0000 mg | Freq: Two times a day (BID) | INTRAVENOUS | Status: DC
  Administered 2015-08-18 – 2015-08-20 (×4): 80 mg via INTRAVENOUS
  Filled 2015-08-18 (×4): qty 80

## 2015-08-18 MED ORDER — METHYLPREDNISOLONE SODIUM SUCC 40 MG IJ SOLR
40.0000 mg | Freq: Four times a day (QID) | INTRAMUSCULAR | Status: DC
  Administered 2015-08-18 – 2015-08-20 (×8): 40 mg via INTRAVENOUS
  Filled 2015-08-18 (×12): qty 1

## 2015-08-18 MED ORDER — SODIUM CHLORIDE 0.9 % IV SOLN
80.0000 mg | Freq: Once | INTRAVENOUS | Status: AC
  Administered 2015-08-18: 80 mg via INTRAVENOUS
  Filled 2015-08-18: qty 80

## 2015-08-18 MED ORDER — NALOXONE HCL 0.4 MG/ML IJ SOLN
0.4000 mg | Freq: Once | INTRAMUSCULAR | Status: AC
  Administered 2015-08-18: 0.4 mg via INTRAVENOUS
  Filled 2015-08-18: qty 1

## 2015-08-18 MED ORDER — SODIUM CHLORIDE 0.9 % IV BOLUS (SEPSIS)
1000.0000 mL | Freq: Once | INTRAVENOUS | Status: AC
  Administered 2015-08-18: 1000 mL via INTRAVENOUS

## 2015-08-18 MED ORDER — IPRATROPIUM-ALBUTEROL 0.5-2.5 (3) MG/3ML IN SOLN
3.0000 mL | RESPIRATORY_TRACT | Status: DC
  Administered 2015-08-18 – 2015-08-20 (×11): 3 mL via RESPIRATORY_TRACT
  Filled 2015-08-18 (×12): qty 3

## 2015-08-18 MED ORDER — PANTOPRAZOLE SODIUM 40 MG IV SOLR: 40.0000 mg | Freq: Every day | INTRAVENOUS | Status: DC

## 2015-08-18 MED ORDER — VANCOMYCIN HCL IN DEXTROSE 1-5 GM/200ML-% IV SOLN
1000.0000 mg | Freq: Two times a day (BID) | INTRAVENOUS | Status: DC
  Administered 2015-08-19 – 2015-08-20 (×4): 1000 mg via INTRAVENOUS
  Filled 2015-08-18 (×3): qty 200

## 2015-08-18 MED ORDER — PIPERACILLIN-TAZOBACTAM 3.375 G IVPB 30 MIN
3.3750 g | INTRAVENOUS | Status: AC
  Administered 2015-08-18: 3.375 g via INTRAVENOUS
  Filled 2015-08-18: qty 50

## 2015-08-18 MED ORDER — INSULIN ASPART 100 UNIT/ML ~~LOC~~ SOLN
0.0000 [IU] | SUBCUTANEOUS | Status: DC
  Administered 2015-08-18 (×2): 1 [IU] via SUBCUTANEOUS
  Administered 2015-08-19 (×4): 2 [IU] via SUBCUTANEOUS
  Filled 2015-08-18: qty 1

## 2015-08-18 MED ORDER — SODIUM CHLORIDE 0.9 % IV SOLN
250.0000 mL | INTRAVENOUS | Status: DC | PRN
  Administered 2015-08-20: 250 mL via INTRAVENOUS

## 2015-08-18 MED ORDER — VANCOMYCIN HCL IN DEXTROSE 1-5 GM/200ML-% IV SOLN
1000.0000 mg | INTRAVENOUS | Status: AC
  Administered 2015-08-18: 1000 mg via INTRAVENOUS
  Filled 2015-08-18: qty 200

## 2015-08-18 NOTE — ED Notes (Signed)
Pt from home via EMS-Per EMS, pt was just d/c 2 days ago from this facility. Pt family called EMS after pt was found to be altered. Pt was recently tx for UTI. Pt is responsive only to pain. Pt O2 was found to be 76% RA, NRB 100%. EMS reports CBG 170, RR 26, BP 132/67, HR 90,unremarkable 12 lead with no ST abnormalities.

## 2015-08-18 NOTE — Progress Notes (Addendum)
CRITICAL VALUE ALERT  Critical value received:  Lactic Acid 2.1  Date of notification:  08/18/2015   Time of notification:  10:46 PM   Critical value read back:Yes.    Nurse who received alert:  Eustace Pen RN  MD notified (1st page):  MD Summer  Time of first page:  10:46 PM   MD notified (2nd page):  Time of second page:  Responding MD:    Time MD responded:

## 2015-08-18 NOTE — ED Notes (Signed)
Attempted blood draw with no success.     

## 2015-08-18 NOTE — H&P (Signed)
Name: [name] [name] MRN: [id number] DOB: [birth year]    ADMISSION DATE:  08/18/2015 CONSULTATION DATE:  08/18/2015  REFERRING MD :  Chaney Malling, M.D. / EDP  CHIEF COMPLAINT:  Respiratory Failure  BRIEF PATIENT DESCRIPTION: 70 year old female with chronic hypercarbic respiratory failure presenting with altered mental status. Recent admission with sepsis secondary to UTI.  SIGNIFICANT EVENTS  2/2 - 2/9 - Admit for Sepsis due to UTI  STUDIES:  Port CXR 2/11:  Low lung volues. Questionable silhouetting of left hemidiaphragm. CT Head w/o 2/11:  No acute abnormality.  MICROBIOLOGY: Blood Ctx x2 2/11>>> Urine Ctx 2/11>>> Respiratory Viral Panel PCR 2/11>>> Influenza PCR 2/11>>> Urine Strep Antigen 2/11>>> Urine Legionella Antigen 2/11>>> MRSA PCR 2/11>>>  ANTIBIOTICS: Vancomycin 2/11>>> Zosyn 2/11>>>  LINES/TUBES: PIV x3  HISTORY OF PRESENT ILLNESS:  History was obtained from the emergency room provider as well as the patient's electronic medical record. I attempted twice to contact the patient's son via phone but was unsuccessful. Patient presented by EMS. Patient recently discharged from hospital with a UTI. During her recent admission she also had hypoxia and altered mentation. Interestingly her urine culture was negative from recent admission. Family reports that the patient has been very lethargic since yesterday and slept through the night. She was reportedly difficult to arouse this morning. The patient was saturating 76% on room air on arrival. The patient also notably had bright red blood in her diaper. She was found to have acute on chronic hypercarbic respiratory failure that seemed to worsen somewhat with oxygen supplementation. I was consulted to assess the patient for possible admission to the ICU versus stepdown unit. Interestingly the patient did become more alert with intermittent doses of IV Narcan per emergency room report.  PAST MEDICAL HISTORY :   has a past medical  history of GERD (gastroesophageal reflux disease); Quadriplegia (HCC); Hypertension; Pressure ulcer of foot; Chronic pain disorder; Hyperlipidemia; Occasional tremors; Chronic diastolic CHF (congestive heart failure) (HCC); Diabetes mellitus (HCC); History of frequent urinary tract infections; History of ESBL E. coli infection; Sepsis (HCC); Atrial fibrillation (HCC); Pericardial effusion; and Pulmonary nodules.  has past surgical history that includes Back surgery (08/2013) and Abdominal hysterectomy. Prior to Admission medications   Medication Sig Start Date End Date Taking? Authorizing Provider  acetaminophen (TYLENOL) 325 MG tablet Take 2 tablets (650 mg total) by mouth every 6 (six) hours as needed for mild pain (or Fever >/= 101). 05/03/15  Yes Belkys A Regalado, MD  amLODipine (NORVASC) 5 MG tablet Take 1 tablet (5 mg total) by mouth daily. 08/16/15  Yes Rodolph Bong, MD  apixaban (ELIQUIS) 5 MG TABS tablet Take 1 tablet (5 mg total) by mouth 2 (two) times daily. 08/16/15  Yes Rodolph Bong, MD  baclofen (LIORESAL) 10 MG tablet Take 1 tablet (10 mg total) by mouth 2 (two) times daily. 05/03/15  Yes Belkys A Regalado, MD  calcium-vitamin D (OSCAL WITH D) 500-200 MG-UNIT per tablet Take 1 tablet by mouth daily with breakfast.   Yes Historical Provider, MD  Chlorphen-Phenyleph-Ibuprofen (ADVIL ALLERGY & CONGESTION) 4-10-200 MG TABS Take 1 tablet by mouth daily as needed (for allergy).   Yes Historical Provider, MD  gabapentin (NEURONTIN) 300 MG capsule Take 300 mg by mouth 3 (three) times daily.   Yes Historical Provider, MD  glimepiride (AMARYL) 2 MG tablet Take 1 tablet (2 mg total) by mouth daily with breakfast. 08/16/15  Yes Rodolph Bong, MD  LORazepam (ATIVAN) 1 MG tablet Take 0.5 tablets (0.5 mg  total) by mouth every 12 (twelve) hours as needed for anxiety. 05/03/15  Yes Belkys A Regalado, MD  magnesium oxide (MAG-OX) 400 MG tablet Take 400 mg by mouth 2 (two) times daily.   Yes  Historical Provider, MD  Multiple Vitamins-Minerals ([employer] SPECTRAVITE ADULT 50+ PO) Take 1 tablet by mouth daily.   Yes Historical Provider, MD  omeprazole (PRILOSEC) 20 MG capsule Take 2 capsules (40 mg total) by mouth daily. Patient taking differently: Take 20 mg by mouth daily.  04/27/15  Yes Vassie Loll, MD  oxymorphone (OPANA ER) 20 MG 12 hr tablet Take 1 tablet (20 mg total) by mouth every 12 (twelve) hours. 05/03/15  Yes Belkys A Regalado, MD  potassium chloride SA (K-DUR,KLOR-CON) 20 MEQ tablet Take 1 tablet (20 mEq total) by mouth daily. 05/03/15  Yes Belkys A Regalado, MD  predniSONE (DELTASONE) 5 MG tablet Take 3 tablets (15 mg total) by mouth daily with breakfast. Resume after taper. 08/21/15  Yes Rodolph Bong, MD  propranolol (INDERAL) 10 MG tablet Take 3 tablets (30 mg total) by mouth 3 (three) times daily. 08/16/15  Yes Rodolph Bong, MD  rOPINIRole (REQUIP) 0.5 MG tablet Take 0.5 mg by mouth 2 (two) times daily. Give 1 tablet 1-3 hours before bedtime (around 1900) and than 1 tablet at bedtime (around 2200)   Yes Historical Provider, MD  rosuvastatin (CRESTOR) 10 MG tablet Take 10 mg by mouth daily. 08/07/15  Yes Historical Provider, MD  saccharomyces boulardii (FLORASTOR) 250 MG capsule Take 1 capsule (250 mg total) by mouth 2 (two) times daily. 04/27/15  Yes Vassie Loll, MD  tamsulosin (FLOMAX) 0.4 MG CAPS capsule Take 0.4 mg by mouth at bedtime.    Yes Historical Provider, MD  torsemide (DEMADEX) 20 MG tablet Take 1 tablet (20 mg total) by mouth daily. 05/03/15  Yes Belkys A Regalado, MD  traZODone (DESYREL) 50 MG tablet Take 50 mg by mouth at bedtime. 04/14/15  Yes Historical Provider, MD  trifluoperazine (STELAZINE) 5 MG tablet Take 5 mg by mouth 2 (two) times daily as needed (for itching).   Yes Historical Provider, MD  predniSONE (DELTASONE) 20 MG tablet Take 2 tablets (40 mg total) by mouth daily before breakfast. Take 2 tablets (40mg ) daily x 2 days, then 1 tablet (20mg )  daily x 2 days then back to home dose of 15mg  daily. Patient not taking: Reported on 08/18/2015 08/16/15   Rodolph Bong, MD   Allergies  Allergen Reactions  . Decadrol [Dexamethasone] Swelling    Swelling of face and stomach   . Sodium Phosphate Swelling    Swelling of face and stomach     FAMILY HISTORY:  family history is not on file. She was adopted.   SOCIAL HISTORY:  reports that she has quit smoking. Her smoking use included Cigarettes. She smoked 0.25 packs per day. She has never used smokeless tobacco. She reports that she does not drink alcohol or use illicit drugs.  REVIEW OF SYSTEMS:  Unable to obtain secondary to altered mentation.  SUBJECTIVE:   VITAL SIGNS: Temp:  [97.9 F (36.6 C)-98.2 F (36.8 C)] 98.2 F (36.8 C) (02/11 1038) Pulse Rate:  [61-69] 63 (02/11 1236) Resp:  [12-24] 18 (02/11 1236) BP: (86-117)/(45-86) 114/86 mmHg (02/11 1236) SpO2:  [90 %-100 %] 94 % (02/11 1305) FiO2 (%):  [35 %] 35 % (02/11 1305) Weight:  [90.719 kg (200 lb)] 90.719 kg (200 lb) (02/11 1008)  PHYSICAL EXAMINATION: General:  Eyes closed. Sitting up in  bed. On BiPAP.  Integument:  Warm & dry. No rash on exposed skin. Bruising of various ages on upper extremities. Lymphatics:  No appreciated cervical or supraclavicular lymphadenoapthy. HEENT:  Moist mucus membranes. No scleral injection or icterus.  Cardiovascular:  Regular rate. Trace lower extremity edema. Normal S1 & S2.  Pulmonary:  Poor aeration bilaterally. Symmetric chest wall expansion. Normal work of breathing on BiPAP. Abdomen: Soft. Normal bowel sounds. Nondistended. Grossly nontender. Musculoskeletal:  Normal bulk and tone. No joint deformity or effusion appreciated. Neurological:  CN 2-12 grossly in tact. Opens eyes to voice intermittently. Not following commands. Psychiatric:  Unable to assess given altered mentation.   Recent Labs Lab 08/15/15 0542 08/16/15 0453 08/18/15 1035  NA 141 139 142  K 4.2 3.8  4.2  CL 98* 93* 92*  CO2 30 37* 41*  BUN 18 23* 23*  CREATININE 0.57 0.72 0.63  GLUCOSE 204* 145* 160*    Recent Labs Lab 08/14/15 0536 08/15/15 0542 08/18/15 1035  HGB 13.6 14.6 13.4  HCT 44.0 47.5* 45.0  WBC 9.5 11.5* 17.5*  PLT 251 246 242   Ct Head Wo Contrast  08/18/2015  CLINICAL DATA:  Altered mental status and sepsis EXAM: CT HEAD WITHOUT CONTRAST TECHNIQUE: Contiguous axial images were obtained from the base of the skull through the vertex without intravenous contrast. COMPARISON:  08/09/2015 FINDINGS: Skull and Sinuses:Negative for fracture or destructive process. The visualized mastoids, middle ears, and imaged paranasal sinuses are clear. Visualized orbits: Negative. Brain: Normal appearance for age. No evidence of acute infarction, hemorrhage, hydrocephalus, or mass lesion/mass effect. Calcified vertebral and carotid atherosclerosis. IMPRESSION: No acute finding.  Unremarkable head CT for age. Electronically Signed   By: Marnee Spring M.D.   On: 08/18/2015 11:04   Dg Chest Port 1 View  08/18/2015  CLINICAL DATA:  70 year old female with acute shortness of breath today. EXAM: PORTABLE CHEST 1 VIEW COMPARISON:  08/10/2015 and prior exams FINDINGS: This is a low volume film. Cardiomegaly and mild pulmonary vascular congestion noted. Mild elevation the right hemidiaphragm is again noted. Minimal bibasilar atelectasis is unchanged. There is no evidence of pneumothorax or definite pleural effusion. No acute bony abnormalities are noted. IMPRESSION: Low volume film with cardiomegaly, mild pulmonary vascular congestion and minimal bibasilar atelectasis. Electronically Signed   By: Harmon Pier M.D.   On: 08/18/2015 11:06    ASSESSMENT / PLAN: 69 year old female with chronic Respiratory failure is close chronic diastolic congestive heart failure presenting with altered mentation. Patient has a known history of ESBL Escherichia coli infection. I suspect her altered mental status is  multifactorial from her chronic narcotic use as well as possibly even her underlying acute on chronic hypercarbic respiratory failure. Currently she is reasonably easy to arouse and I do not feel in need of a Narcan infusion. She will need to be closely monitored in the stepdown unit for further decompensation in the event that she requires endotracheal intubation. Given her leukocytosis broad-spectrum MiraLAX will be continued while awaiting culture results.  1. Acute encephalopathy: Multifactorial. Likely secondary to hypercarbia & chronic narcotic use. Holding home pain medications. Monitor closely. 2. Acute on chronic hypercarbic respiratory failure: Starting the patient on Solu-Medrol 40 mg IV every 6 hours & DuoNeb every 4 hours. Continuing on BiPAP to improve ventilation while decreasing oxygen focal saturation to minimize VQ mismatching. 3. Possible sepsis: Checking Procalcitonin per algorithm. Continuing broad-spectrum antibiotic coverage with vancomycin & Zosyn. Awaiting blood & urine cultures. Also ordering influenza viral PCR as well  as respiratory viral panel PCR. 4. Possible GI bleed: Currently hemodynamically stable. Fecal occult blood test positive. Continuing Protonix IV every 12 hours. Ordering type and screen. Checking coags. Repeat hemoglobin in the morning with CBC. Holding systemic anticoagulation. 5. History of diabetes mellitus: Continuing Accu-Cheks every 4 hours with sliding scale insulin coverage. 6. History of atrial fibrillation: Continuing to monitor the patient on telemetry. Holding systemic anticoagulation. 7. Chronic narcotic & benzodiazepine use: Holding home medications at this time pending improvement in mental status. 8. History of GERD: Continuing Protonix IV twice a day. 9. History of hypertension: Holding home medications pending improvement in mental status. 10. History of diastolic congestive heart failure: Holding torsemide given absence of overt signs of volume  overload. 11. Diet: Nothing by mouth pending improvement in mental status. 12. Prophylaxis: SCDs & Protonix IV twice a day. 13. CODE STATUS: Patient is full code per my discussion with the emergency department provider. I was unable to contact son to confirm this today.  Donna Christen Jamison Neighbor, M.D. [facility] Pager:  [phone] After 3pm or if no response, call [phone] 08/18/2015, 1:25 PM

## 2015-08-18 NOTE — ED Provider Notes (Signed)
CSN: [id number]     Arrival date & time 08/18/15  9528 History   First MD Initiated Contact with Patient 08/18/15 0945     Chief Complaint  Patient presents with  . Altered Mental Status     (Consider location/radiation/quality/duration/timing/severity/associated sxs/prior Treatment) The history is provided by the EMS personnel. The history is limited by the absence of a caregiver and the condition of the patient.  [name] [name] is a 70 y.o. female history reflux, hypertension, hyperlipidemia, recent UTI here presenting with altered mental status. Patient was discharged 2 days ago from the hospital and is currently home. The most recent admission was for altered mental status, hypoxia, and possible UTI. She had extensive workup in the hospital including a normal CT head, CT and angio chest and CT abdomen pelvis. The hypoxia also further worked up And had a echo that shows small pericardial effusion and cardiology did not recommend any further workup. As per the family, patient was noted to be very lethargic since yesterday and slept all night and difficult to arouse this morning. Patient unable to give any history. EMS noticed that she was hypoxic 76% on RA and she was placed on nonrebreather. Moreover, she was noted to have bright red blood in her diaper.    Level V caveat- AMS   Past Medical History  Diagnosis Date  . GERD (gastroesophageal reflux disease)   . Quadriplegia (HCC)     a. 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  . Hypertension   . Pressure ulcer of foot   . Chronic pain disorder   . Hyperlipidemia   . Occasional tremors   . Chronic diastolic CHF (congestive heart failure) (HCC)   . Diabetes mellitus (HCC)   . History of frequent urinary tract infections   . History of ESBL E. coli infection   . Sepsis (HCC)     a. several prior adm for sepsis.  . Atrial fibrillation (HCC)     a. Observed during 08/2015 admission.   . Pericardial effusion     a. Observed during 08/2015 admission - moderate.  . Pulmonary nodules    Past Surgical History  Procedure Laterality Date  . Back surgery  08/2013  . Abdominal hysterectomy     Family History  Problem Relation Age of Onset  . Adopted: Yes   Social History  Substance Use Topics  . Smoking status: Former Smoker -- 0.25 packs/day    Types: Cigarettes  . Smokeless tobacco: Never Used  . Alcohol Use: No   OB History    No data available     Review of Systems  Unable to perform ROS: Mental status change  All other systems reviewed and are negative.     Allergies  Decadrol and Sodium phosphate  Home Medications   Prior to Admission medications   Medication Sig Start Date End Date Taking? Authorizing Provider  acetaminophen (TYLENOL) 325 MG tablet Take 2 tablets (650 mg total) by mouth every 6 (six) hours as needed for mild pain (or Fever >/= 101). 05/03/15  Yes Belkys A Regalado, MD  amLODipine (NORVASC) 5 MG tablet Take 1 tablet (5 mg total) by mouth daily. 08/16/15  Yes Rodolph Bong, MD  apixaban (ELIQUIS) 5 MG TABS tablet Take 1 tablet (5 mg total) by mouth 2 (two) times daily. 08/16/15  Yes Rodolph Bong, MD  baclofen (LIORESAL) 10 MG tablet Take 1 tablet (10 mg total) by  mouth 2 (two) times daily. 05/03/15  Yes Belkys A Regalado, MD  calcium-vitamin D (OSCAL WITH D) 500-200 MG-UNIT per tablet Take 1 tablet by mouth daily with breakfast.   Yes Historical Provider, MD  Chlorphen-Phenyleph-Ibuprofen (ADVIL ALLERGY & CONGESTION) 4-10-200 MG TABS Take 1 tablet by mouth daily as needed (for allergy).   Yes Historical Provider, MD  gabapentin (NEURONTIN) 300 MG capsule Take 300 mg by mouth 3 (three) times daily.   Yes Historical Provider, MD  glimepiride (AMARYL) 2 MG tablet Take 1 tablet (2 mg total) by mouth daily with breakfast. 08/16/15  Yes Rodolph Bong, MD  LORazepam (ATIVAN) 1 MG tablet Take 0.5 tablets (0.5 mg total) by mouth every 12  (twelve) hours as needed for anxiety. 05/03/15  Yes Belkys A Regalado, MD  magnesium oxide (MAG-OX) 400 MG tablet Take 400 mg by mouth 2 (two) times daily.   Yes Historical Provider, MD  Multiple Vitamins-Minerals ([employer] SPECTRAVITE ADULT 50+ PO) Take 1 tablet by mouth daily.   Yes Historical Provider, MD  omeprazole (PRILOSEC) 20 MG capsule Take 2 capsules (40 mg total) by mouth daily. Patient taking differently: Take 20 mg by mouth daily.  04/27/15  Yes Vassie Loll, MD  oxymorphone (OPANA ER) 20 MG 12 hr tablet Take 1 tablet (20 mg total) by mouth every 12 (twelve) hours. 05/03/15  Yes Belkys A Regalado, MD  potassium chloride SA (K-DUR,KLOR-CON) 20 MEQ tablet Take 1 tablet (20 mEq total) by mouth daily. 05/03/15  Yes Belkys A Regalado, MD  predniSONE (DELTASONE) 5 MG tablet Take 3 tablets (15 mg total) by mouth daily with breakfast. Resume after taper. 08/21/15  Yes Rodolph Bong, MD  propranolol (INDERAL) 10 MG tablet Take 3 tablets (30 mg total) by mouth 3 (three) times daily. 08/16/15  Yes Rodolph Bong, MD  rOPINIRole (REQUIP) 0.5 MG tablet Take 0.5 mg by mouth 2 (two) times daily. Give 1 tablet 1-3 hours before bedtime (around 1900) and than 1 tablet at bedtime (around 2200)   Yes Historical Provider, MD  rosuvastatin (CRESTOR) 10 MG tablet Take 10 mg by mouth daily. 08/07/15  Yes Historical Provider, MD  saccharomyces boulardii (FLORASTOR) 250 MG capsule Take 1 capsule (250 mg total) by mouth 2 (two) times daily. 04/27/15  Yes Vassie Loll, MD  tamsulosin (FLOMAX) 0.4 MG CAPS capsule Take 0.4 mg by mouth at bedtime.    Yes Historical Provider, MD  torsemide (DEMADEX) 20 MG tablet Take 1 tablet (20 mg total) by mouth daily. 05/03/15  Yes Belkys A Regalado, MD  traZODone (DESYREL) 50 MG tablet Take 50 mg by mouth at bedtime. 04/14/15  Yes Historical Provider, MD  trifluoperazine (STELAZINE) 5 MG tablet Take 5 mg by mouth 2 (two) times daily as needed (for itching).   Yes Historical  Provider, MD  predniSONE (DELTASONE) 20 MG tablet Take 2 tablets (40 mg total) by mouth daily before breakfast. Take 2 tablets ( ) daily x 2 days, then 1 tablet ( ) daily x 2 days then back to home dose of  daily. Patient not taking: Reported on 08/18/2015 08/16/15   Rodolph Bong, MD   BP 114/86 mmHg  Pulse 63  Temp(Src) 98.2 F (36.8 C) (Rectal)  Resp 18  Ht  (1.575 m)  Wt 200 lb (90.719 kg)  BMI 36.57 kg/m2  SpO2 94% Physical Exam  Constitutional:  Altered, moving all extremities   HENT:  Head: Normocephalic.  Eyes: Conjunctivae are normal. Pupils are equal, round, and reactive to  light.  Neck: Normal range of motion. Neck supple.  Cardiovascular: Normal rate, regular rhythm and normal heart sounds.   Pulmonary/Chest:  Diminished bilaterally   Abdominal: Soft. Bowel sounds are normal. She exhibits no distension. There is no tenderness. There is no rebound.  Musculoskeletal: Normal range of motion. She exhibits no edema or tenderness.  Neurological:  Altered, unable to answer questions. Moving all extremities   Skin: Skin is warm and dry.  Psychiatric:  Unable   Nursing note and vitals reviewed.   ED Course  Procedures (including critical care time)  CRITICAL CARE Performed by: Silverio Lay, DAVID   Total critical care time: 30 minutes  Critical care time was exclusive of separately billable procedures and treating other patients.  Critical care was necessary to treat or prevent imminent or life-threatening deterioration.  Critical care was time spent personally by me on the following activities: development of treatment plan with patient and/or surrogate as well as nursing, discussions with consultants, evaluation of patient's response to treatment, examination of patient, obtaining history from patient or surrogate, ordering and performing treatments and interventions, ordering and review of laboratory studies, ordering and review of radiographic studies,  pulse oximetry and re-evaluation of patient's condition.   Labs Review Labs Reviewed  COMPREHENSIVE METABOLIC PANEL - Abnormal; Notable for the following:    Chloride 92 (*)    CO2 41 (*)    Glucose, Bld 160 (*)    BUN 23 (*)    Calcium 8.5 (*)    Total Protein 5.8 (*)    Albumin 3.3 (*)    AST 96 (*)    ALT 87 (*)    All other components within normal limits  CBC WITH DIFFERENTIAL/PLATELET - Abnormal; Notable for the following:    WBC 17.5 (*)    MCHC 29.8 (*)    Neutro Abs 14.0 (*)    Monocytes Absolute 1.1 (*)    All other components within normal limits  BLOOD GAS, VENOUS - Abnormal; Notable for the following:    pH, Ven 7.249 (*)    pCO2, Ven 105 (*)    Bicarbonate 44.2 (*)    Acid-Base Excess 12.1 (*)    All other components within normal limits  BRAIN NATRIURETIC PEPTIDE - Abnormal; Notable for the following:    B Natriuretic Peptide 166.4 (*)    All other components within normal limits  BLOOD GAS, ARTERIAL - Abnormal; Notable for the following:    pH, Arterial 7.251 (*)    pCO2 arterial 94.6 (*)    pO2, Arterial 186 (*)    Bicarbonate 40.2 (*)    Acid-Base Excess 9.8 (*)    All other components within normal limits  URINE RAPID DRUG SCREEN, HOSP PERFORMED - Abnormal; Notable for the following:    Opiates POSITIVE (*)    All other components within normal limits  POC OCCULT BLOOD, ED - Abnormal; Notable for the following:    Fecal Occult Bld POSITIVE (*)    All other components within normal limits  I-STAT CG4 LACTIC ACID, ED - Abnormal; Notable for the following:    Lactic Acid, Venous 3.27 (*)    All other components within normal limits  CULTURE, BLOOD (ROUTINE X 2)  CULTURE, BLOOD (ROUTINE X 2)  URINE CULTURE  URINALYSIS, ROUTINE W REFLEX MICROSCOPIC (NOT AT [facility])  ETHANOL  SALICYLATE LEVEL  ACETAMINOPHEN LEVEL  I-STAT CG4 LACTIC ACID, ED  TYPE AND SCREEN  ABO/RH    Imaging Review Ct Head Wo Contrast  08/18/2015  CLINICAL DATA:  Altered mental  status and sepsis EXAM: CT HEAD WITHOUT CONTRAST TECHNIQUE: Contiguous axial images were obtained from the base of the skull through the vertex without intravenous contrast. COMPARISON:  08/09/2015 FINDINGS: Skull and Sinuses:Negative for fracture or destructive process. The visualized mastoids, middle ears, and imaged paranasal sinuses are clear. Visualized orbits: Negative. Brain: Normal appearance for age. No evidence of acute infarction, hemorrhage, hydrocephalus, or mass lesion/mass effect. Calcified vertebral and carotid atherosclerosis. IMPRESSION: No acute finding.  Unremarkable head CT for age. Electronically Signed   By: Marnee Spring M.D.   On: 08/18/2015 11:04   Dg Chest Port 1 View  08/18/2015  CLINICAL DATA:  70 year old female with acute shortness of breath today. EXAM: PORTABLE CHEST 1 VIEW COMPARISON:  08/10/2015 and prior exams FINDINGS: This is a low volume film. Cardiomegaly and mild pulmonary vascular congestion noted. Mild elevation the right hemidiaphragm is again noted. Minimal bibasilar atelectasis is unchanged. There is no evidence of pneumothorax or definite pleural effusion. No acute bony abnormalities are noted. IMPRESSION: Low volume film with cardiomegaly, mild pulmonary vascular congestion and minimal bibasilar atelectasis. Electronically Signed   By: Harmon Pier M.D.   On: 08/18/2015 11:06   I have personally reviewed and evaluated these images and lab results as part of my medical decision-making.   EKG Interpretation None      MDM   Final diagnoses:  None   Jamielee Mchale is a 70 y.o. female here with AMS. Consider sepsis vs stroke vs bleed vs GI bleed vs electrolyte abnormalities. Will get labs, CT head, cultures, type and screen. She has hx of CHF and is already hypoxic so will hold off aggressive resuscitation.   12 pm Patient's mental status continue to deteriorate. CT head nl. CXR showed diminished lung volume. WBC 17. Concerned for possible aspiration  pneumonia. Ordered vanc/zosyn. Will try narcan. I discussed with son, who confirmed that she is more lethargic for 2 days. He states that she is full code. Also occ positive but Hg stable. Given a dose of protonix.   12: 15pm Narcan improved mental status. Now more awake but still not making sense. BP improved to 110 from 80s. She states that she is in pain. Suspect taking too much narcotics. UDS + opiates. VBG showed pH 7.2 with CO2 105. ABG showed pH 7.25, CO2 95. Patient on bipap.   1:28 PM Still minimally responsive. Consulted ICU to admit given lethargy, acidosis, hypoxic respiratory failure on bipap. Held off intubation currently since she is very deconditioned and may not come off the vent.   2:59 PM ICU saw patient. Will admit to ICU at [facility].    Richardean Canal, MD 08/18/15 [phone]

## 2015-08-18 NOTE — Progress Notes (Signed)
Pharmacy Antibiotic Note  [name] [name] is a 70 y.o. female admitted on 08/18/2015 with AMS and possible sepsis.  Pharmacy has been consulted for vancomycin and zosyn dosing.  She was recently admitted 2/2-2/9 with sepsis, UTI.  Code Sepsis was called.  PMH: PMH: paraplegia d/t spinal cord injury, GERD, HTN, frequent UTIs with ESBL, chronic pain, HLD, dCHF, Afib on apixaban  Plan:  Vancomycin 1g IV q12h Check trough at steady state Zosyn 3.375gm IV q8h (4hr extended infusions) Follow up renal function & cultures, clinical course De-escalate as appropriate  Height:  (157.5 cm) Weight: 200 lb (90.719 kg) IBW/kg (Calculated) : 50.1  Temp (24hrs), Avg:98.1 F (36.7 C), Min:97.9 F (36.6 C), Max:98.2 F (36.8 C)   Recent Labs Lab 08/11/15 1400 08/12/15 0344 08/12/15 0924 08/13/15 0606 08/14/15 0536 08/15/15 0542 08/16/15 0453 08/18/15 1035 08/18/15 1054  WBC  --  9.2  --  9.7 9.5 11.5*  --  17.5*  --   CREATININE  --  0.55  --  0.60 0.54 0.57 0.72  --   --   LATICACIDVEN 3.9*  --  1.8  --   --   --   --   --  1.59    Estimated Creatinine Clearance: 68.5 mL/min (by C-G formula based on Cr of 0.72).    Allergies  Allergen Reactions  . Decadrol [Dexamethasone] Swelling    Swelling of face and stomach   . Sodium Phosphate Swelling    Swelling of face and stomach     Antimicrobials this admission: 2/11 >> Vanc >> 2/11 >> Zosyn >>  Levels/dose changes this admission: ---  Micro from previous admission: 2/2 BCx: NGF 2/2 UCx: NGF  2/2 MRSA PCR (-) 2/2 Influenza panel (-)  Micro from this admission: 2/11 BCx:  Thank you for allowing pharmacy to be a part of this patient's care.  Loralee Pacas, PharmD, BCPS Pager: [phone] 08/18/2015 11:11 AM

## 2015-08-18 NOTE — ED Notes (Signed)
Bed: WA06 Expected date:  Expected time:  Means of arrival:  Comments: EMS- UTI, sepsis?

## 2015-08-18 NOTE — Progress Notes (Signed)
Pt transported with BIPAP to ICU from ED

## 2015-08-19 ENCOUNTER — Encounter (HOSPITAL_COMMUNITY): Payer: Self-pay | Admitting: *Deleted

## 2015-08-19 DIAGNOSIS — I4891 Unspecified atrial fibrillation: Secondary | ICD-10-CM

## 2015-08-19 DIAGNOSIS — F1190 Opioid use, unspecified, uncomplicated: Secondary | ICD-10-CM

## 2015-08-19 DIAGNOSIS — B379 Candidiasis, unspecified: Secondary | ICD-10-CM

## 2015-08-19 LAB — GLUCOSE, CAPILLARY
GLUCOSE-CAPILLARY: 131 mg/dL — AB (ref 65–99)
GLUCOSE-CAPILLARY: 151 mg/dL — AB (ref 65–99)
GLUCOSE-CAPILLARY: 184 mg/dL — AB (ref 65–99)
GLUCOSE-CAPILLARY: 192 mg/dL — AB (ref 65–99)
GLUCOSE-CAPILLARY: 314 mg/dL — AB (ref 65–99)
Glucose-Capillary: 199 mg/dL — ABNORMAL HIGH (ref 65–99)
Glucose-Capillary: 271 mg/dL — ABNORMAL HIGH (ref 65–99)

## 2015-08-19 LAB — BASIC METABOLIC PANEL
ANION GAP: 12 (ref 5–15)
BUN: 15 mg/dL (ref 6–20)
CALCIUM: 8.5 mg/dL — AB (ref 8.9–10.3)
CHLORIDE: 98 mmol/L — AB (ref 101–111)
CO2: 32 mmol/L (ref 22–32)
Creatinine, Ser: 0.58 mg/dL (ref 0.44–1.00)
GFR calc non Af Amer: >60 mL/min (ref >60)
Glucose, Bld: 197 mg/dL — ABNORMAL HIGH (ref 65–99)
POTASSIUM: 3.7 mmol/L (ref 3.5–5.1)
Sodium: 142 mmol/L (ref 135–145)

## 2015-08-19 LAB — URINE CULTURE: Culture: NO GROWTH

## 2015-08-19 LAB — LACTIC ACID, PLASMA
LACTIC ACID, VENOUS: 1.3 mmol/L (ref 0.5–2.0)
LACTIC ACID, VENOUS: 1.3 mmol/L (ref 0.5–2.0)

## 2015-08-19 LAB — CBC
HEMATOCRIT: 41.4 % (ref 36.0–46.0)
Hemoglobin: 12.6 g/dL (ref 12.0–15.0)
MCH: 26.6 pg (ref 26.0–34.0)
MCHC: 30.4 g/dL (ref 30.0–36.0)
MCV: 87.5 fL (ref 78.0–100.0)
PLATELETS: 238 10*3/uL (ref 150–400)
RBC: 4.73 MIL/uL (ref 3.87–5.11)
RDW: 14.9 % (ref 11.5–15.5)
WBC: 13.9 10*3/uL — AB (ref 4.0–10.5)

## 2015-08-19 LAB — PROCALCITONIN: Procalcitonin: <0.1 ng/mL

## 2015-08-19 LAB — PHOSPHORUS: PHOSPHORUS: 2.5 mg/dL (ref 2.5–4.6)

## 2015-08-19 LAB — STREP PNEUMONIAE URINARY ANTIGEN: STREP PNEUMO URINARY ANTIGEN: NEGATIVE

## 2015-08-19 LAB — MAGNESIUM: Magnesium: 2 mg/dL (ref 1.7–2.4)

## 2015-08-19 MED ORDER — ROPINIROLE HCL 0.5 MG PO TABS
0.5000 mg | ORAL_TABLET | Freq: Two times a day (BID) | ORAL | Status: DC
  Administered 2015-08-19 – 2015-08-21 (×4): 0.5 mg via ORAL
  Filled 2015-08-19 (×6): qty 1

## 2015-08-19 MED ORDER — BACLOFEN 10 MG PO TABS
10.0000 mg | ORAL_TABLET | Freq: Two times a day (BID) | ORAL | Status: DC
  Administered 2015-08-19 – 2015-08-20 (×3): 10 mg via ORAL
  Filled 2015-08-19 (×4): qty 1

## 2015-08-19 MED ORDER — OXYCODONE HCL 5 MG PO TABS
5.0000 mg | ORAL_TABLET | Freq: Four times a day (QID) | ORAL | Status: DC | PRN
  Administered 2015-08-19 – 2015-08-20 (×3): 5 mg via ORAL
  Filled 2015-08-19 (×4): qty 1

## 2015-08-19 MED ORDER — ACETAMINOPHEN 325 MG PO TABS
650.0000 mg | ORAL_TABLET | Freq: Four times a day (QID) | ORAL | Status: DC | PRN
  Administered 2015-08-19 – 2015-08-20 (×3): 650 mg via ORAL
  Filled 2015-08-19 (×3): qty 2

## 2015-08-19 MED ORDER — SACCHAROMYCES BOULARDII 250 MG PO CAPS
250.0000 mg | ORAL_CAPSULE | Freq: Two times a day (BID) | ORAL | Status: DC
  Administered 2015-08-19 – 2015-08-21 (×5): 250 mg via ORAL
  Filled 2015-08-19 (×6): qty 1

## 2015-08-19 MED ORDER — TAMSULOSIN HCL 0.4 MG PO CAPS
0.4000 mg | ORAL_CAPSULE | Freq: Every day | ORAL | Status: DC
  Administered 2015-08-19 – 2015-08-20 (×2): 0.4 mg via ORAL
  Filled 2015-08-19 (×3): qty 1

## 2015-08-19 MED ORDER — ROSUVASTATIN CALCIUM 10 MG PO TABS
10.0000 mg | ORAL_TABLET | Freq: Every day | ORAL | Status: DC
  Administered 2015-08-19 – 2015-08-21 (×3): 10 mg via ORAL
  Filled 2015-08-19 (×3): qty 1

## 2015-08-19 MED ORDER — INSULIN ASPART 100 UNIT/ML ~~LOC~~ SOLN
0.0000 [IU] | Freq: Three times a day (TID) | SUBCUTANEOUS | Status: DC
Start: 2015-08-19 — End: 2015-08-21
  Administered 2015-08-19: 8 [IU] via SUBCUTANEOUS
  Administered 2015-08-20 (×2): 5 [IU] via SUBCUTANEOUS
  Administered 2015-08-20: 11 [IU] via SUBCUTANEOUS
  Administered 2015-08-21: 8 [IU] via SUBCUTANEOUS

## 2015-08-19 MED ORDER — AMLODIPINE BESYLATE 5 MG PO TABS
5.0000 mg | ORAL_TABLET | Freq: Every day | ORAL | Status: DC
  Administered 2015-08-19 – 2015-08-21 (×3): 5 mg via ORAL
  Filled 2015-08-19 (×3): qty 1

## 2015-08-19 MED ORDER — INSULIN ASPART 100 UNIT/ML ~~LOC~~ SOLN
0.0000 [IU] | Freq: Every day | SUBCUTANEOUS | Status: DC
  Administered 2015-08-19 – 2015-08-20 (×2): 4 [IU] via SUBCUTANEOUS

## 2015-08-19 MED ORDER — TRAZODONE HCL 50 MG PO TABS
50.0000 mg | ORAL_TABLET | Freq: Every day | ORAL | Status: DC
  Administered 2015-08-19 – 2015-08-20 (×2): 50 mg via ORAL
  Filled 2015-08-19 (×3): qty 1

## 2015-08-19 MED ORDER — MORPHINE SULFATE (PF) 2 MG/ML IV SOLN
2.0000 mg | Freq: Once | INTRAVENOUS | Status: AC
  Administered 2015-08-19: 1 mg via INTRAVENOUS
  Filled 2015-08-19: qty 1

## 2015-08-19 MED ORDER — ROPINIROLE HCL 0.5 MG PO TABS
0.5000 mg | ORAL_TABLET | Freq: Two times a day (BID) | ORAL | Status: DC
  Filled 2015-08-19 (×2): qty 1

## 2015-08-19 MED ORDER — GABAPENTIN 300 MG PO CAPS
300.0000 mg | ORAL_CAPSULE | Freq: Three times a day (TID) | ORAL | Status: DC
  Administered 2015-08-19 – 2015-08-21 (×7): 300 mg via ORAL
  Filled 2015-08-19 (×9): qty 1

## 2015-08-19 MED ORDER — FLUCONAZOLE 150 MG PO TABS
150.0000 mg | ORAL_TABLET | ORAL | Status: DC
  Administered 2015-08-19: 150 mg via ORAL
  Filled 2015-08-19: qty 2

## 2015-08-19 MED ORDER — LORAZEPAM 0.5 MG PO TABS
0.5000 mg | ORAL_TABLET | Freq: Two times a day (BID) | ORAL | Status: DC | PRN
  Administered 2015-08-19 – 2015-08-20 (×3): 0.5 mg via ORAL
  Filled 2015-08-19 (×4): qty 1

## 2015-08-19 NOTE — Progress Notes (Signed)
eLink Physician-Brief Progress Note Patient Name: [name] [name] DOB: 01-10-1946 MRN: [id number]   Date of Service  08/19/2015  HPI/Events of Note  High BP noted. Patient awake and appears comfortable.  eICU Interventions  Resumed home norvasc     Intervention Category Intermediate Interventions: Hypertension - evaluation and management  Dorothyann Gibbs 08/19/2015, 5:54 PM

## 2015-08-19 NOTE — Progress Notes (Signed)
Utilization review completed.  

## 2015-08-19 NOTE — Progress Notes (Signed)
RN called d/t MD wants pt off bipap.  Sat 95% on room air, no distress noted.  Pt is awake and talking denies SOB, pt states her breathing is "feeling better".

## 2015-08-19 NOTE — Progress Notes (Signed)
eLink Physician-Brief Progress Note Patient Name: [name] [name] DOB: [birth year] MRN: [id number]   Date of Service  08/19/2015  HPI/Events of Note  Camera check on patient. On BiPAP & Saturating 98% on monitor. Seems to be resting comfortably. Spoke with bedside RN who reports patient is more verbal than earlier and expressed being cold.   eICU Interventions  1. Continue to hold sedating medications 2. Decrease FiO2 to 0.21 3. Continue to monitor closely     Intervention Category Major Interventions: Respiratory failure - evaluation and management  Lawanda Cousins 08/19/2015, 2:01 AM

## 2015-08-19 NOTE — Progress Notes (Signed)
Name: [name] [name] MRN: [id number] DOB: 1946-02-01    ADMISSION DATE:  08/18/2015 CONSULTATION DATE:  08/18/2015  REFERRING MD :  Chaney Malling, M.D. / EDP  CHIEF COMPLAINT:  Respiratory Failure  BRIEF PATIENT DESCRIPTION: 70 year old female with chronic hypercarbic respiratory failure presenting with altered mental status. Recent admission with sepsis secondary to UTI.  SIGNIFICANT EVENTS  2/2 - 2/9 - Admit for Sepsis due to UTI  STUDIES:  Port CXR 2/11:  Low lung volues. Questionable silhouetting of left hemidiaphragm. CT Head w/o 2/11:  No acute abnormality.  MICROBIOLOGY: Blood Ctx x2 2/11>>> Urine Ctx 2/11>>> Respiratory Viral Panel PCR 2/12>>> Urine Strep Antigen 2/11>>> Urine Legionella Antigen 2/11>>> MRSA PCR 2/12>>>  ANTIBIOTICS: Vancomycin 2/11>>> Zosyn 2/11>>>  LINES/TUBES: PIV x3  SUBJECTIVE: No acute events overnight. Remained on BiPAP overnight. Patient denies any dyspnea or cough. She denies any chest pain or pressure. She is experiencing some back/flank pain bilaterally.  REVIEW OF SYSTEMS:  No nausea or vomiting. No subjective fever, chills, or sweats.  VITAL SIGNS: Temp:  [97.9 F (36.6 C)-100.2 F (37.9 C)] 99.2 F (37.3 C) (02/12 0000) Pulse Rate:  [58-75] 73 (02/12 0236) Resp:  [10-24] 17 (02/12 0236) BP: (80-163)/(43-93) 125/47 mmHg (02/12 0200) SpO2:  [90 %-100 %] 96 % (02/12 0238) FiO2 (%):  [21 %-35 %] 21 % (02/12 0238) Weight:  [90.719 kg (200 lb)] 90.719 kg (200 lb) (02/11 1008)  PHYSICAL EXAMINATION: General:  Awake. Alert. No distress. Currently on BiPAP. Integument:  Warm & dry. Erythematous rash on groin and inguinal areas reviewed with nurse at bedside. Bruising of various ages on upper extremities. Lymphatics:  No appreciated cervical or supraclavicular lymphadenoapthy. HEENT:  Tacky mucus membranes. No scleral injection or icterus.  Cardiovascular:  Regular rate. Trace lower extremity edema unchanged. Normal S1 & S2.  Pulmonary:   Clear bilaterally to auscultation. Normal work of breathing on BiPAP. Abdomen: Soft. Normal bowel sounds. Protuberant. Nontender. Neurological:  CN 2-12 grossly in tact. Follows commands. Oriented to year and president.   Recent Labs Lab 08/16/15 0453 08/18/15 1035 08/19/15 0212  NA 139 142 142  K 3.8 4.2 3.7  CL 93* 92* 98*  CO2 37* 41* 32  BUN 23* 23* 15  CREATININE 0.72 0.63 0.58  GLUCOSE 145* 160* 197*    Recent Labs Lab 08/15/15 0542 08/18/15 1035 08/19/15 0212  HGB 14.6 13.4 12.6  HCT 47.5* 45.0 41.4  WBC 11.5* 17.5* 13.9*  PLT 246 242 238   Ct Head Wo Contrast  08/18/2015  CLINICAL DATA:  Altered mental status and sepsis EXAM: CT HEAD WITHOUT CONTRAST TECHNIQUE: Contiguous axial images were obtained from the base of the skull through the vertex without intravenous contrast. COMPARISON:  08/09/2015 FINDINGS: Skull and Sinuses:Negative for fracture or destructive process. The visualized mastoids, middle ears, and imaged paranasal sinuses are clear. Visualized orbits: Negative. Brain: Normal appearance for age. No evidence of acute infarction, hemorrhage, hydrocephalus, or mass lesion/mass effect. Calcified vertebral and carotid atherosclerosis. IMPRESSION: No acute finding.  Unremarkable head CT for age. Electronically Signed   By: Marnee Spring M.D.   On: 08/18/2015 11:04   Dg Chest Port 1 View  08/18/2015  CLINICAL DATA:  70 year old female with acute shortness of breath today. EXAM: PORTABLE CHEST 1 VIEW COMPARISON:  08/10/2015 and prior exams FINDINGS: This is a low volume film. Cardiomegaly and mild pulmonary vascular congestion noted. Mild elevation the right hemidiaphragm is again noted. Minimal bibasilar atelectasis is unchanged. There is no evidence of pneumothorax  or definite pleural effusion. No acute bony abnormalities are noted. IMPRESSION: Low volume film with cardiomegaly, mild pulmonary vascular congestion and minimal bibasilar atelectasis. Electronically  Signed   By: Harmon Pier M.D.   On: 08/18/2015 11:06    ASSESSMENT / PLAN: 70 year old female with chronic Respiratory failure is close chronic diastolic congestive heart failure presenting with altered mentation. Patient has a known history of ESBL Escherichia coli infection. Patient's altered mental status significantly improved. I question whether or not this was due to her chronic use of pain and sedating medications. Certainly an underlying infectious process such as a urinary tract infection is possible. I am restarting some of her home medications but holding Opana given her response to Narcan yesterday. Given patient's continued clinical improvement I will transition care over to hospitalist group.  1. Acute encephalopathy: Resolving. Likely secondary to hypercarbia & chronic narcotic use. Holding home Opana. 2. Acute on chronic hypercarbic respiratory failure: Continuing Solu-Medrol 40 mg IV every 6 hours & DuoNeb every 4 hours. Discontinuing BiPAP. 3. Possible sepsis: Procalcitonin negative. Continuing broad-spectrum antibiotic coverage with vancomycin & Zosyn. Awaiting blood & urine cultures. Also awaiting respiratory viral panel PCR. 4. Possible GI bleed: Currently hemodynamically stable. Fecal occult blood test positive. Continuing Protonix IV every 12 hours. Trending Hgb daily. Holding Eliquis. 5. Lactic Acidosis:  Resolved. 6. Vulvovaginal candidiasis: Treating with Diflucan 150 mg by mouth every 72 hours 2 doses. 7. History of diabetes mellitus: BG controlled. Plan to transition to before meals & at bedtime as patient is eating. 8. History of atrial fibrillation: Continuing to monitor the patient on telemetry. Holding systemic anticoagulation. 9. Chronic narcotic & benzodiazepine use: Restarting home Requip, Ativan, baclofen, & trazodone. Holding Opana. Starting oxycodone 5 mg by mouth when necessary. 10. History of GERD: Continuing Protonix IV twice a day. 11. History of  hypertension: Holding home medications pending improvement in mental status. Hypertension seems to be related to pain. 12. History of diastolic congestive heart failure: Holding torsemide given absence of overt signs of volume overload. 13. Diet: Clear liquid diet. Plan to advance as tolerated. 14. Prophylaxis: SCDs & Protonix IV twice a day. 15. Disposition: Transitioning patient to hospitalist medicine service. PCCM will sign off 2/13.  Donna Christen Jamison Neighbor, M.D. [facility] Pulmonary [facility] Critical [facility] Pager:  [phone] After 3pm or if no response, call [phone] 08/19/2015, 5:33 AM

## 2015-08-19 NOTE — Consult Note (Signed)
WOC wound consult note Reason for Consult:INtertriginous dermatitis (ITD), a form of Moisture Associated Skin Damage (MASD).  Patient on mattress with low air loss feature, but with 5 layers of linen (disposable and reusable mix), including one disposable underpad placed between her legs.  An indwelling urinary cathter has been placed today which should assist in the resolution of this issue. Wound type: Moisture Pressure Ulcer POA: No Measurement: bilateral inguinal areas and upper medial thighs Wound bed: red, moist, peeling Drainage (amount, consistency, odor) scant serous Periwound: none in other skin folds such as panus, bilateral inframammary or axillae. Dressing procedure/placement/frequency: I have provided Nursing with orders and guidance for use of our house antimicrobial textile and have requested that patient use only one layer of linen between her and her therapeutic mattress with low air loss feature.  Patient does not like to turn and reposition off of the supine position and I stress the importance of this to patient and husband.  WOC nursing team will not follow, but will remain available to this patient, the nursing and medical teams.  Please re-consult if needed. Thanks, Ladona Mow, MSN, RN, GNP, [place]  Pager# [phone]

## 2015-08-20 DIAGNOSIS — J9621 Acute and chronic respiratory failure with hypoxia: Secondary | ICD-10-CM

## 2015-08-20 DIAGNOSIS — I5032 Chronic diastolic (congestive) heart failure: Secondary | ICD-10-CM

## 2015-08-20 DIAGNOSIS — G8250 Quadriplegia, unspecified: Secondary | ICD-10-CM

## 2015-08-20 DIAGNOSIS — T402X1S Poisoning by other opioids, accidental (unintentional), sequela: Secondary | ICD-10-CM

## 2015-08-20 DIAGNOSIS — E876 Hypokalemia: Secondary | ICD-10-CM

## 2015-08-20 DIAGNOSIS — I480 Paroxysmal atrial fibrillation: Secondary | ICD-10-CM

## 2015-08-20 LAB — GLUCOSE, CAPILLARY
GLUCOSE-CAPILLARY: 250 mg/dL — AB (ref 65–99)
GLUCOSE-CAPILLARY: 348 mg/dL — AB (ref 65–99)
Glucose-Capillary: 322 mg/dL — ABNORMAL HIGH (ref 65–99)
Glucose-Capillary: 231 mg/dL — ABNORMAL HIGH (ref 65–99)

## 2015-08-20 LAB — CBC
HCT: 41.2 % (ref 36.0–46.0)
HEMOGLOBIN: 13 g/dL (ref 12.0–15.0)
MCH: 27.6 pg (ref 26.0–34.0)
MCHC: 31.6 g/dL (ref 30.0–36.0)
MCV: 87.5 fL (ref 78.0–100.0)
Platelets: 286 10*3/uL (ref 150–400)
RBC: 4.71 MIL/uL (ref 3.87–5.11)
RDW: 15.3 % (ref 11.5–15.5)
WBC: 9.8 10*3/uL (ref 4.0–10.5)

## 2015-08-20 LAB — BASIC METABOLIC PANEL
Anion gap: 11 (ref 5–15)
BUN: 10 mg/dL (ref 6–20)
CALCIUM: 8.5 mg/dL — AB (ref 8.9–10.3)
CO2: 31 mmol/L (ref 22–32)
CREATININE: 0.56 mg/dL (ref 0.44–1.00)
Chloride: 100 mmol/L — ABNORMAL LOW (ref 101–111)
GFR calc Af Amer: >60 mL/min (ref >60)
GLUCOSE: 306 mg/dL — AB (ref 65–99)
Potassium: 2.8 mmol/L — ABNORMAL LOW (ref 3.5–5.1)
Sodium: 142 mmol/L (ref 135–145)

## 2015-08-20 LAB — LEGIONELLA ANTIGEN, URINE

## 2015-08-20 LAB — PROCALCITONIN

## 2015-08-20 LAB — MAGNESIUM: MAGNESIUM: 1.9 mg/dL (ref 1.7–2.4)

## 2015-08-20 MED ORDER — INSULIN ASPART 100 UNIT/ML ~~LOC~~ SOLN
3.0000 [IU] | Freq: Three times a day (TID) | SUBCUTANEOUS | Status: DC
  Administered 2015-08-20 – 2015-08-21 (×2): 3 [IU] via SUBCUTANEOUS

## 2015-08-20 MED ORDER — POTASSIUM CHLORIDE CRYS ER 20 MEQ PO TBCR
40.0000 meq | EXTENDED_RELEASE_TABLET | ORAL | Status: AC
  Administered 2015-08-20 (×3): 40 meq via ORAL
  Filled 2015-08-20 (×3): qty 2

## 2015-08-20 MED ORDER — IPRATROPIUM-ALBUTEROL 0.5-2.5 (3) MG/3ML IN SOLN: 3.0000 mL | RESPIRATORY_TRACT | Status: DC | PRN

## 2015-08-20 MED ORDER — PROPRANOLOL HCL 20 MG PO TABS
30.0000 mg | ORAL_TABLET | Freq: Three times a day (TID) | ORAL | Status: DC
  Administered 2015-08-20 – 2015-08-21 (×3): 30 mg via ORAL
  Filled 2015-08-20 (×6): qty 1

## 2015-08-20 MED ORDER — PREDNISONE 50 MG PO TABS
60.0000 mg | ORAL_TABLET | Freq: Every day | ORAL | Status: DC
  Administered 2015-08-21: 60 mg via ORAL
  Filled 2015-08-20 (×2): qty 1

## 2015-08-20 MED ORDER — TORSEMIDE 20 MG PO TABS
20.0000 mg | ORAL_TABLET | Freq: Every day | ORAL | Status: DC
  Administered 2015-08-20 – 2015-08-21 (×2): 20 mg via ORAL
  Filled 2015-08-20 (×2): qty 1

## 2015-08-20 MED ORDER — INSULIN DETEMIR 100 UNIT/ML ~~LOC~~ SOLN
10.0000 [IU] | Freq: Every day | SUBCUTANEOUS | Status: DC
  Administered 2015-08-20: 10 [IU] via SUBCUTANEOUS
  Filled 2015-08-20 (×2): qty 0.1

## 2015-08-20 MED ORDER — APIXABAN 5 MG PO TABS
5.0000 mg | ORAL_TABLET | Freq: Two times a day (BID) | ORAL | Status: DC
  Administered 2015-08-20 – 2015-08-21 (×3): 5 mg via ORAL
  Filled 2015-08-20 (×4): qty 1

## 2015-08-20 MED ORDER — PANTOPRAZOLE SODIUM 40 MG PO TBEC
40.0000 mg | DELAYED_RELEASE_TABLET | Freq: Two times a day (BID) | ORAL | Status: DC
  Administered 2015-08-20 – 2015-08-21 (×2): 40 mg via ORAL
  Filled 2015-08-20 (×3): qty 1

## 2015-08-20 NOTE — Progress Notes (Signed)
ANTICOAGULATION CONSULT NOTE - Initial Consult  Pharmacy Consult for Apixaban Indication: atrial fibrillation  Allergies  Allergen Reactions  . Decadrol [Dexamethasone] Swelling    Swelling of face and stomach   . Sodium Phosphate Swelling    Swelling of face and stomach    Patient Measurements: Height:  (157.5 cm) Weight: 199 lb 11.8 oz (90.6 kg) IBW/kg (Calculated) : 50.1  Vital Signs: Temp: 99.8 F (37.7 C) (02/13 1235) Temp Source: Oral (02/13 1235) BP: 124/79 mmHg (02/13 1235) Pulse Rate: 112 (02/13 1235)  Labs:  Recent Labs  08/18/15 1035 08/18/15 2035 08/19/15 0212 08/20/15 0416  HGB 13.4  --  12.6 13.0  HCT 45.0  --  41.4 41.2  PLT 242  --  238 286  APTT  --  29  --   --   LABPROT  --  16.5*  --   --   INR  --  1.32  --   --   CREATININE 0.63  --  0.58 0.56   Estimated Creatinine Clearance: 68.5 mL/min (by C-G formula based on Cr of 0.56).  Medical History: Past Medical History  Diagnosis Date  . GERD (gastroesophageal reflux disease)   . Quadriplegia (HCC)     a. 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  . Hypertension   . Pressure ulcer of foot   . Chronic pain disorder   . Hyperlipidemia   . Occasional tremors   . Chronic diastolic CHF (congestive heart failure) (HCC)   . Diabetes mellitus (HCC)   . History of frequent urinary tract infections   . History of ESBL E. coli infection   . Sepsis (HCC)     a. several prior adm for sepsis.  . Atrial fibrillation (HCC)     a. Observed during 08/2015 admission.  . Pericardial effusion     a. Observed during 08/2015 admission - moderate.  . Pulmonary nodules    Medications:  Scheduled:  . amLODipine  5 mg Oral Daily  . fluconazole  150 mg Oral Q72H  . gabapentin  300 mg Oral TID  . insulin aspart  0-15 Units Subcutaneous TID WC  . insulin aspart  0-5 Units Subcutaneous QHS  . insulin aspart  3 Units Subcutaneous TID WC  . insulin  detemir  10 Units Subcutaneous QHS  . ipratropium-albuterol  3 mL Nebulization Q4H  . pantoprazole  40 mg Oral BID  . potassium chloride  40 mEq Oral Q4H  . [START ON 08/21/2015] predniSONE  60 mg Oral Q breakfast  . propranolol  30 mg Oral TID  . rOPINIRole  0.5 mg Oral BID  . rosuvastatin  10 mg Oral Daily  . saccharomyces boulardii  250 mg Oral BID  . tamsulosin  0.4 mg Oral QHS  . torsemide  20 mg Oral Daily  . traZODone  50 mg Oral QHS   Assessment: 70 yo quadriplegic female recently admitted 2/2-2/9 with sepsis, UTI. Now presents 2/11 with AMS. Code sepsis called and pharmacy was consulted to dose vancomycin and zosyn. Prior to admission Apixaban  bid, last dose 2/11, was held for GI bleed. Plan resume Apixaban today 2/13.  CBC wnl, GI bleed resolved  Goal of Therapy:  Monitor platelets by anticoagulation protocol: Yes   Plan:   Apixaban  bid  Monitor CBC  Otho Bellows PharmD Pager [phone] 08/20/2015, 12:45 PM

## 2015-08-20 NOTE — Progress Notes (Signed)
Inpatient Diabetes Program Recommendations  AACE/ADA: New Consensus Statement on Inpatient Glycemic Control (2015)  Target Ranges:  Prepandial:   less than 140 mg/dL      Peak postprandial:   less than 180 mg/dL (1-2 hours)      Critically ill patients:  140 - 180 mg/dL   Review of Glycemic Control  Diabetes history: DM2 Outpatient Diabetes medications: Amaryl 2 mg QD Current orders for Inpatient glycemic control: Novolog moderate tidwc and hs  Results for [name] [name], [name] [name] (MRN [id number]) as of 08/20/2015 09:40  Ref. Range 08/19/2015 12:20 08/19/2015 16:27 08/19/2015 20:58 08/20/2015 07:57  Glucose-Capillary Latest Ref Range: 65-99 mg/dL 184 (H) 271 (H) 314 (H) 250 (H)  Results for [name] [name], [name] [name] (MRN [id number]) as of 08/20/2015 09:40  Ref. Range 08/09/2015 21:09  Hemoglobin A1C Latest Ref Range: 4.8-5.6 % 9.3 (H)   Would benefit from insulin adjustment. HgbA1C high - blood sugars not controlled at home.  Inpatient Diabetes Program Recommendations:    Add Lantus 12 units QHS Add Novolog 3 units tidwc for meal coverage insulin while on steroids.  If pt is to go home on insulin, will order insulin starter kit. Will follow. Thank you. Lorenda Peck, RD, LDN, CDE Inpatient Diabetes Coordinator [phone]

## 2015-08-20 NOTE — Progress Notes (Addendum)
Nutrition Brief Note  Patient identified on Low [place] list.  Pt with minimal weight loss, weight remains stable. Pt eating well.   Wt Readings from Last 15 Encounters:  08/20/15 199 lb 11.8 oz (90.6 kg)  08/16/15 201 lb 8 oz (91.4 kg)  05/03/15 206 lb 11.2 oz (93.759 kg)  04/23/15 194 lb 8 oz (88.225 kg)  04/17/15 185 lb 9.6 oz (84.188 kg)  01/28/15 188 lb 15.8 oz (85.724 kg)  01/19/15 175 lb 7.8 oz (79.6 kg)  08/27/14 168 lb 6.9 oz (76.4 kg)  04/18/14 160 lb (72.576 kg)  03/29/14 171 lb 4.8 oz (77.7 kg)  03/14/14 165 lb 2 oz (74.9 kg)  06/10/13 173 lb (78.472 kg)    Body mass index is 36.52 kg/(m^2). Patient meets criteria for obesity based on current BMI.   Current diet order is Heart Healthy/CHO modified, patient is consuming approximately 95% of meals at this time. Labs and medications reviewed.   No nutrition interventions warranted at this time. If nutrition issues arise, please consult RD.   Tilda Franco, MS, RD, LDN Pager: [phone] After Hours Pager: [phone]

## 2015-08-20 NOTE — Progress Notes (Signed)
Pt tranferred from ICU to 1521. AO x 4. Pt belongings are at bedside. Pt orientated to the unit and made aware of how to use call bell and bedside table within reach. Report received from [place], [place]. No questions or concerns at this time.  Iyanah Demont W Johnnay Pleitez, RN

## 2015-08-20 NOTE — Progress Notes (Signed)
[facility] PROGRESS NOTE  [name] [name] NWG:[id number] DOB: [birth year] DOA: 08/18/2015 PCP: [name], MD  Brief Summary  70 year old female with chronic hypercarbic respiratory failure presenting with altered mental status. Recent admission with sepsis secondary to UTI.  Assessment/Plan  70 year old female with chronic respiratory failure due to chronic diastolic congestive heart failure presenting with altered mentation possibly due to sedating medications.  She improved with several doses of narcan.   1. Acute encephalopathy: Resolved. Likely secondary to hypercarbia & chronic narcotic use. Continue prn oxycodone. 2. Acute on chronic hypercarbic respiratory failure: decrease to prednisone  daily tomorrow & DuoNeb every 4 hours.  Transfer to med-surg 3. Possible sepsis: Procalcitonin repeatedly negative.  D/c antibiotics. Blood & urine cultures NGTD.  -  F/u respiratory viral panel PCR. 4. Occult positive stool:  Hemoglobin stable.  Continue PPI.  resume eliquis 5. Lactic Acidosis: Resolved. 6. Vulvovaginal candidiasis: Treating with Diflucan 150 mg by mouth every 72 hours 2 doses. 7. Diabetes mellitus: BG elevated due to steroids.  Start levemir and add mealtime standing insulin.   8. PAF, CHADs2vasc = 4 : Continuing to monitor the patient on telemetry. Resume systemic anticoagulation. 9. Chronic narcotic & benzodiazepine use:  Continue home Requip, Ativan, baclofen, & trazodone. Continue oxycodone 5 mg by mouth when necessary. 10. GERD: Continuing Protonix twice a day. 11. HTN:  Continue norvasc.   12. Chronic diastolic congestive heart failure:  Resume daily torsemide  13. Quadriplegia  14. Hypokalemia, likely iatrogenic, start oral potassium repletion, check magnesium and repeat labs in the morning 15. Diet:  Diabetic diet 16. Prophylaxis: SCDs & Protonix 17. Disposition: Transitioning patient to hospitalist medicine service  Diet:  diabetic Access:   PIV IVF:  off Proph:  eliquis  Code Status: full Family Communication: patient alone Disposition Plan: transfer to med-surg and continue to transition to room air.  PT/OT   Consultants:  none  Procedures:  CXR  Antibiotics:  vanc and zosyn 2/11 -2/13   HPI/Subjective:  States she feels much better.  Wants to eat regular food and not liquids.  Denies chest pains, abdominal pains, wheezes and severe cough.    Objective: Filed Vitals:   08/20/15 0810 08/20/15 0830 08/20/15 1000 08/20/15 1100  BP:   165/65 162/74  Pulse:   114 111  Temp:  98.4 F (36.9 C)    TempSrc:  Oral    Resp:   10 12  Height:      Weight:      SpO2: 94%  94% 95%    Intake/Output Summary (Last 24 hours) at 08/20/15 1206 Last data filed at 08/20/15 0843  Gross per 24 hour  Intake   1580 ml  Output   1625 ml  Net    -45 ml   Filed Weights   08/18/15 1008 08/19/15 0500 08/20/15 0410  Weight: 90.719 kg (200 lb) 94.3 kg (207 lb 14.3 oz) 90.6 kg (199 lb 11.8 oz)   Body mass index is 36.52 kg/(m^2).  Exam:   General:  Obese female, No acute distress  HEENT:  NCAT, MMM  Cardiovascular:  RRR, nl S1, S2 no mrg, 2+ pulses, warm extremities  Respiratory:  Diminished bilateral breath sounds, no wheezes, rales, rhonchi, no increased WOB  Abdomen:   NABS, soft, mildly distended, nontender  MSK:   Decreased tone and bulk, 1+ pitting with some nonpitting LEE, quadriplegic with flaccid legs  Neuro:  Grossly intact, 4- out of 5 upper extremity strength  Data Reviewed: Basic Metabolic Panel:  Recent  Labs Lab 08/15/15 0542 08/16/15 0453 08/18/15 1035 08/19/15 0212 08/20/15 0416  NA 141 139 142 142 142  K 4.2 3.8 4.2 3.7 2.8*  CL 98* 93* 92* 98* 100*  CO2 30 37* 41* 32 31  GLUCOSE 204* 145* 160* 197* 306*  BUN 18 23* 23* 15 10  CREATININE 0.57 0.72 0.63 0.58 0.56  CALCIUM 9.6 9.1 8.5* 8.5* 8.5*  MG 2.0  --   --  2.0 1.9  PHOS  --   --   --  2.5  --    Liver Function  Tests:  Recent Labs Lab 08/18/15 1035  AST 96*  ALT 87*  ALKPHOS 56  BILITOT 0.7  PROT 5.8*  ALBUMIN 3.3*   No results for input(s): LIPASE, AMYLASE in the last 168 hours. No results for input(s): AMMONIA in the last 168 hours. CBC:  Recent Labs Lab 08/14/15 0536 08/15/15 0542 08/18/15 1035 08/19/15 0212 08/20/15 0416  WBC 9.5 11.5* 17.5* 13.9* 9.8  NEUTROABS  --   --  14.0*  --   --   HGB 13.6 14.6 13.4 12.6 13.0  HCT 44.0 47.5* 45.0 41.4 41.2  MCV 86.4 87.5 91.5 87.5 87.5  PLT 251 246 242 238 286    Recent Results (from the past 240 hour(s))  Urine culture     Status: None   Collection Time: 08/18/15 10:07 AM  Result Value Ref Range Status   Specimen Description URINE, CATHETERIZED  Final   Special Requests NONE  Final   Culture   Final    NO GROWTH 1 DAY Performed at [facility]    Report Status 08/19/2015 FINAL  Final  Blood Culture (routine x 2)     Status: None (Preliminary result)   Collection Time: 08/18/15 10:29 AM  Result Value Ref Range Status   Specimen Description BLOOD RIGHT WRIST  Final   Special Requests IN PEDIATRIC BOTTLE 1CC  Final   Culture   Final    NO GROWTH < 24 HOURS Performed at [facility]    Report Status PENDING  Incomplete  Blood Culture (routine x 2)     Status: None (Preliminary result)   Collection Time: 08/18/15 10:35 AM  Result Value Ref Range Status   Specimen Description BLOOD RIGHT ANTECUBITAL  Final   Special Requests BOTTLES DRAWN AEROBIC ONLY 5CC  Final   Culture   Final    NO GROWTH < 24 HOURS Performed at [facility]    Report Status PENDING  Incomplete  MRSA PCR Screening     Status: None   Collection Time: 08/18/15  7:19 PM  Result Value Ref Range Status   MRSA by PCR NEGATIVE NEGATIVE Final    Comment:        The GeneXpert MRSA Assay (FDA approved for NASAL specimens only), is one component of a comprehensive MRSA colonization surveillance program. It is not intended to  diagnose MRSA infection nor to guide or monitor treatment for MRSA infections.      Studies: No results found.  Scheduled Meds: . amLODipine  5 mg Oral Daily  . baclofen  10 mg Oral BID  . fluconazole  150 mg Oral Q72H  . gabapentin  300 mg Oral TID  . insulin aspart  0-15 Units Subcutaneous TID WC  . insulin aspart  0-5 Units Subcutaneous QHS  . ipratropium-albuterol  3 mL Nebulization Q4H  . methylPREDNISolone (SOLU-MEDROL) injection  40 mg Intravenous Q6H  . pantoprazole (PROTONIX) IV  80 mg Intravenous Q12H  . piperacillin-tazobactam (ZOSYN)  IV  3.375 g Intravenous Q8H  . potassium chloride  40 mEq Oral Q4H  . rOPINIRole  0.5 mg Oral BID  . rosuvastatin  10 mg Oral Daily  . saccharomyces boulardii  250 mg Oral BID  . tamsulosin  0.4 mg Oral QHS  . traZODone  50 mg Oral QHS  . vancomycin  1,000 mg Intravenous Q12H   Continuous Infusions:   Active Problems:   Altered mental status    Time spent: 30 min    Thurma Priego, [facility]  [facility] Pager [phone]. If 7PM-7AM, please contact night-coverage at [email or web address], password [facility] 08/20/2015, 12:06 PM  LOS: 2 days

## 2015-08-21 DIAGNOSIS — J9601 Acute respiratory failure with hypoxia: Secondary | ICD-10-CM

## 2015-08-21 DIAGNOSIS — G9341 Metabolic encephalopathy: Secondary | ICD-10-CM

## 2015-08-21 DIAGNOSIS — J9602 Acute respiratory failure with hypercapnia: Secondary | ICD-10-CM

## 2015-08-21 DIAGNOSIS — I10 Essential (primary) hypertension: Secondary | ICD-10-CM

## 2015-08-21 DIAGNOSIS — G894 Chronic pain syndrome: Secondary | ICD-10-CM

## 2015-08-21 DIAGNOSIS — K625 Hemorrhage of anus and rectum: Secondary | ICD-10-CM

## 2015-08-21 LAB — BASIC METABOLIC PANEL
Anion gap: 10 (ref 5–15)
BUN: 19 mg/dL (ref 6–20)
CALCIUM: 8.8 mg/dL — AB (ref 8.9–10.3)
CO2: 32 mmol/L (ref 22–32)
CREATININE: 0.7 mg/dL (ref 0.44–1.00)
Chloride: 100 mmol/L — ABNORMAL LOW (ref 101–111)
Glucose, Bld: 142 mg/dL — ABNORMAL HIGH (ref 65–99)
Potassium: 3.7 mmol/L (ref 3.5–5.1)
SODIUM: 142 mmol/L (ref 135–145)

## 2015-08-21 LAB — CBC
HCT: 42.1 % (ref 36.0–46.0)
Hemoglobin: 12.8 g/dL (ref 12.0–15.0)
MCH: 26.8 pg (ref 26.0–34.0)
MCHC: 30.4 g/dL (ref 30.0–36.0)
MCV: 88.3 fL (ref 78.0–100.0)
Platelets: 310 K/uL (ref 150–400)
RBC: 4.77 MIL/uL (ref 3.87–5.11)
RDW: 15.6 % — ABNORMAL HIGH (ref 11.5–15.5)
WBC: 15.4 K/uL — ABNORMAL HIGH (ref 4.0–10.5)

## 2015-08-21 LAB — GLUCOSE, CAPILLARY
GLUCOSE-CAPILLARY: 117 mg/dL — AB (ref 65–99)
Glucose-Capillary: 252 mg/dL — ABNORMAL HIGH (ref 65–99)

## 2015-08-21 LAB — MAGNESIUM: MAGNESIUM: 1.8 mg/dL (ref 1.7–2.4)

## 2015-08-21 MED ORDER — FLUCONAZOLE 150 MG PO TABS: 150.0000 mg | ORAL_TABLET | Freq: Once | ORAL | Status: DC

## 2015-08-21 MED ORDER — OMEPRAZOLE 20 MG PO CPDR: 40.0000 mg | DELAYED_RELEASE_CAPSULE | Freq: Every day | ORAL | Status: AC

## 2015-08-21 MED ORDER — OXYCODONE HCL 5 MG PO TABS: 5.0000 mg | ORAL_TABLET | Freq: Two times a day (BID) | ORAL | Status: DC | PRN

## 2015-08-21 NOTE — Care Management Note (Signed)
Case Management Note  Patient Details  Name: [name] [name] MRN: [id number] Date of Birth: Apr 21, 1946  Subjective/Objective:    Admitted with with AMS, respiratory failure. H/O quadriparesis of longstanding. Has 24/7 caregivers, Michiel Sites lift, total care.                Action/Plan: Discharge planning, no new needs identified, will resume home services already in place.  Expected Discharge Date:                  Expected Discharge Plan:  Home/Self Care  In-House Referral:  NA  Discharge planning Services  CM Consult  Post Acute Care Choice:  Resumption of Svcs/PTA Provider Choice offered to:  Patient  DME Arranged:  N/A DME Agency:  NA  HH Arranged:  PCS/Personal Care Services [facility] Agency:  Other - See comment  Status of Service:  Completed, signed off  Medicare Important Message Given:    Date Medicare IM Given:    Medicare IM give by:    Date Additional Medicare IM Given:    Additional Medicare Important Message give by:     If discussed at Long Length of Stay Meetings, dates discussed:    Additional Comments:  Alexis Goodell, RN 08/21/2015, 12:41 PM

## 2015-08-21 NOTE — Care Management Important Message (Addendum)
Important Message  Patient Details IM Letter given to Suzanne/Case Manager to present to Patient Name: [name] [name] MRN: [id number] Date of Birth: [birth year]   Medicare Important Message Given:  Yes    Cathi, [place] 08/21/2015, 12:53 PMImportant Message  Patient Details  Name: [name] [name] MRN: [id number] Date of Birth: 06-07-1946   Medicare Important Message Given:  Yes    [place], [place] 08/21/2015, 12:53 PM

## 2015-08-21 NOTE — Evaluation (Signed)
Occupational Therapy Evaluation Patient Details Name: [name] [name] MRN: [id number] DOB: Dec 17, 1945 Today's Date: 08/21/2015    History of Present Illness 70 yo female admitted 08/18/15 with AMS, respiratory failure. H/O recurrent UTI, respiratory distress. H/O quadriparesis of longstanding. Has 24/7 caregu=ivers, Hoyer lift, total care.   Clinical Impression   Pt plans to DC this day. Focus of OT session was BUE and encouraging pt to get back to performing exercises to maintain function in her BUE for self feeding, grooming and other BUe activities.  Explained pts BUE weak due to being in hospital and bed and pt would need to be diligent to regain strength.  Pt declined HHOT stating her CNA would A her with exercises.    Follow Up Recommendations  Other (comment) (declined HHOT)    Equipment Recommendations  None recommended by OT    Recommendations for Other Services       Precautions / Restrictions Precautions Precautions: Other (comment) Precaution Comments: quadriplegic Restrictions Weight Bearing Restrictions: No      Mobility Bed Mobility               General bed mobility comments: declined  Transfers Overall transfer level: Needs assistance               General transfer comment: dependent - uses hoyer         ADL Overall ADL's : At baseline                                       General ADL Comments: pt overall dependent with ADL activty - but pt is able to A with BUE- with grooming and self feeding.               Pertinent Vitals/Pain Pain Assessment: No/denies pain     Hand Dominance Right   Extremity/Trunk Assessment Upper Extremity Assessment RUE Deficits / Details: shoulder flexion AROM to 90*, B ulnar deviationi of fingers, weak but functional grip +3/5 B, elbow flexion +3/5, extension -3/5; reports tingling BUEs to light touch         Communication Communication Communication: No difficulties   Cognition  Arousal/Alertness: Awake/alert Behavior During Therapy: WFL for tasks assessed/performed Overall Cognitive Status: Within Functional Limits for tasks assessed                                Home Living Family/patient expects to be discharged to:: Private residence Living Arrangements: Children Available Help at Discharge: Available 24 hours/day;Personal care attendant Type of Home: House Home Access: Ramped entrance     Home Layout: One level     Bathroom Shower/Tub: Other (comment) (sponge bathes in bed)         Home Equipment: Wheelchair - manual;Hospital bed;Bedside commode   Additional Comments: has Product/process development scientist      Prior Functioning/Environment Level of Independence: Needs assistance  Gait / Transfers Assistance Needed: Hoyer to manual WC, unable to sit on EOB due to poor balance; aides perform LE ROM and UE AAROM exercises with pt daily ADL's / Homemaking Assistance Needed: able to feed self finger food, assist for bathing/dressing, pt can assist with UE ADLs        OT Diagnosis: Generalized weakness   OT Problem List: Decreased strength;Decreased range of motion;Decreased activity tolerance   OT Treatment/Interventions:  End of Session    Activity Tolerance: Patient tolerated treatment well Patient left: in bed;with call bell/phone within reach;with family/visitor present   Time: 1610-9604 OT Time Calculation (min): 13 min Charges:  OT General Charges $OT Visit: 1 Procedure OT Evaluation $OT Eval Low Complexity: 1 Procedure G-Codes:    Alba Cory 2015/08/24, 11:27 AM

## 2015-08-21 NOTE — Progress Notes (Signed)
Pt discharged from the unit via wheelchair. Pt transportation service provided by the family picked the pt up. Discharge instructions were reviewed with the pt and family. Pt family was informed why pt was not being started back on [facility]. No questions or concerns from the pt at this time.  Lashante Fryberger W Claudean Leavelle, RN

## 2015-08-21 NOTE — Evaluation (Signed)
Physical Therapy Evaluation Patient Details Name: [name] [name] MRN: [id number] DOB: 08-Apr-1946 Today's Date: 08/21/2015   History of Present Illness  70 yo female admitted 08/18/15 with AMS, respiratory failure. H/O recurrent UTI, respiratory distress. H/O quadriparesis of longstanding. Has 24/7 caregu=ivers, Hoyer lift, total care.  Clinical Impression  Pt is at baseline with mobility, she requires a mechanical lift to WC. She has 24* assistance at home and has no further PT nor DME needs. PT signing off.     Follow Up Recommendations No PT follow up    Equipment Recommendations  None recommended by PT    Recommendations for Other Services       Precautions / Restrictions Precautions Precautions: Other (comment) Precaution Comments: quadriplegic Restrictions Weight Bearing Restrictions: No      Mobility  Bed Mobility               General bed mobility comments: NT -pt declined bed mobility, stated she's going home soon. At baseline she is dependent on [facility] for transfers and requires total assist for sitting balance  Transfers                    Ambulation/Gait                Stairs            Wheelchair Mobility    Modified Rankin (Stroke Patients Only)       Balance                                             Pertinent Vitals/Pain Pain Assessment: No/denies pain    Home Living Family/patient expects to be discharged to:: Private residence Living Arrangements: Children Available Help at Discharge: Available 24 hours/day;Personal care attendant Type of Home: House Home Access: Ramped entrance     Home Layout: One level Home Equipment: Wheelchair - manual;Hospital bed;Bedside commode Additional Comments: has Product/process development scientist    Prior Function Level of Independence: Needs assistance   Gait / Transfers Assistance Needed: Hoyer to manual WC, unable to sit on EOB due to poor balance; aides perform LE ROM and UE AAROM  exercises with pt daily  ADL's / Homemaking Assistance Needed: able to feed self finger food, assist for bathing/dressing, pt can assist with UE ADLs        Hand Dominance   Dominant Hand: Right    Extremity/Trunk Assessment     RUE Deficits / Details: shoulder flexion AROM to 90*, B ulnar deviationi of fingers, weak but functional grip +3/5 B, elbow flexion +3/5, extension -3/5; reports tingling BUEs to light touch           RLE Deficits / Details: absent light touch B feet, intact to light touch R thigh, absent L thigh,  B 1/5 hip flexion, B knee/ankle 0/5       Communication   Communication: No difficulties  Cognition Arousal/Alertness: Awake/alert Behavior During Therapy: WFL for tasks assessed/performed Overall Cognitive Status: Within Functional Limits for tasks assessed                      General Comments      Exercises General Exercises - Lower Extremity Ankle Circles/Pumps: PROM;Both;10 reps;Supine Heel Slides: PROM;Both;10 reps;Supine Hip ABduction/ADduction: PROM;Both;10 reps;Supine      Assessment/Plan    PT Assessment Patent does not need any further PT  services  PT Diagnosis Quadraplegia   PT Problem List    PT Treatment Interventions     PT Goals (Current goals can be found in the Care Plan section) Acute Rehab PT Goals PT Goal Formulation: All assessment and education complete, DC therapy    Frequency     Barriers to discharge        Co-evaluation               End of Session   Activity Tolerance: Patient tolerated treatment well;No increased pain Patient left: in bed;with call bell/phone within reach Nurse Communication: Need for lift equipment         Time: 1610-9604 PT Time Calculation (min) (ACUTE ONLY): 15 min   Charges:   PT Evaluation $PT Eval Low Complexity: 1 Procedure     PT G Codes:        Tamala Ser 08/21/2015, 9:54 AM [phone]

## 2015-08-21 NOTE — Discharge Summary (Signed)
Physician Discharge Summary  [name] [name] ZOX:[id number] DOB: 1946/03/20 DOA: 08/18/2015  PCP: [name], MD  Admit date: 08/18/2015 Discharge date: 08/21/2015  Recommendations for Outpatient Follow-up:  1. F/u with primary care doctor in 1 week or sooner as needed 2. F/u blood cultures and respiratory panel which is pending 3. Repeat CBC and CMP in 1 week 4. Ongoing tapering of sedating medications 5. GI referral for rectal bleeding  Discharge Diagnoses:  Principal Problem:   Acute respiratory failure with hypoxia and hypercarbia (HCC) Active Problems:   Metabolic encephalopathy   Hypokalemia   Diabetes (HCC)   Chronic pain disorder   Hypertension   Altered mental status   Chronic diastolic (congestive) heart failure (HCC)   PAF (paroxysmal atrial fibrillation) (HCC)   Bright red blood per rectum   Discharge Condition: stable, improved  Diet recommendation: diabetic diet  Wt Readings from Last 3 Encounters:  08/20/15 90.6 kg (199 lb 11.8 oz)  08/16/15 91.4 kg (201 lb 8 oz)  05/03/15 93.759 kg (206 lb 11.2 oz)    History of present illness:  70 y.o. 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  She was recently admitted for ESBL E. Coli UTI with sepsis from 2/2 through 2/9.  She was discharged to home but readmitted when she developed altered mental status, lethargy and was difficult to arouse.  Initial O2 sat was 76% on RA and she had some bright red blood in her diaper.  She was admitted to the ICU and had improved alertness with intermittent doses of IV narcan.    Hospital Course:    Acute encephalopathy, likely related to chronic narcotic use and hypercapnia. She had improvement on BiPAP and with a reduction in her sedating medications. Her baclofen was discontinued  and she was changed to oxycodone 5 mg to use twice daily as needed for pain.  She was also treated for possible COPD exacerbation and was given a tapering dose of steroids. Although she was initially treated for possible sepsis, her pro-calcitonin levels were repeatedly negative. Her broad-spectrum antibiotics were discontinued after blood and urine cultures remain no growth to date for over 48 hours. General respiratory viral panel obtained which is still pending at the time of discharge. Doubtful that respiratory panel will change management. She had improvement in her mentation and at the time of discharge she was asking to go home.  Occult positive stool, hemoglobin remained stable. She was placed on a PPI and although her anticoagulation was initially held, it was resumed during hospitalization. She is advised to follow-up with her primary care doctor in 1 week for repeat CBC and to obtain a referral if felt indicated for gastroenterology assessment.  Given the small amount of bleeding she had, it is likely that she had bleeding from hemorrhoids that stopped spontaneously. She might also had some bleeding from some rather severe perineal dermatitis.  Moisture induced dermatitis plus possible candidal dermatitis of the perineal area, improved with moisture barrier and a couple of doses of Diovan.  Diabetes mellitus, elevated blood sugar secondary to steroids. She was started on a combination of long and Braedon Sjogren acting insulin which was titrated. She will receive a very fast taper of steroids at home, and recommended that she resume her home diabetes regimen.  Paroxysmal atrial fibrillation with a chads 2vasc score of 4. Although her systemic anticoagulation was  temporarily discontinued, because she did not have further bleeding and because her hemoglobin remained stable, it was resumed. The risk of having a stroke was felt to be higher than the risk of having a severe GI bleed at this time.  Chronic  narcotic and benzodiazepine use. The patient is on numerous sedating medications which were felt to have contributed to her life-threatening hypercapnia and somnolence. Her baclofen was discontinued as was her previous narcotic. She was advised to use oxycodone 5 mg up to 2 times per day as needed. Would recommend additional decreases in her sedating medications under the supervision of her primary care doctor.  GERD, stable, continue PPI  Hypertension, stable, continue Norvasc  Chronic diastolic heart failure, her torsemide was initially held due to concern for sepsis but resumed.  Quadriplegia, chronic condition.  Hypokalemia, likely iatrogenic and resolved with oral potassium repletion.  Leukocytosis, felt to be secondary to steroids. Recommend repeat CBC and proximally one week by primary care doctor to ensure improvement.  Consultants:  none  Procedures:  CXR  Antibiotics:  vanc and zosyn 2/11 -2/13  Discharge Exam: Filed Vitals:   08/21/15 0515 08/21/15 1026  BP: 148/91 136/62  Pulse: 63   Temp: 98.3 F (36.8 C)   Resp: 18    Filed Vitals:   08/20/15 1315 08/20/15 2249 08/21/15 0515 08/21/15 1026  BP: 155/69 147/64 148/91 136/62  Pulse: 104 70 63   Temp: 99.6 F (37.6 C) 99.4 F (37.4 C) 98.3 F (36.8 C)   TempSrc: Oral Oral Oral   Resp: 16 18 18    Height:      Weight:      SpO2: 95% 97% 97%      General: Obese female, No acute distress, asking to go home  HEENT: NCAT, MMM  Cardiovascular: RRR, nl S1, S2 no mrg  Respiratory: Diminished bilateral breath sounds, no wheezes, rales, rhonchi, no increased WOB  Abdomen: NABS, soft, mildly distended, nontender  MSK: Decreased tone and bulk, 1+ pitting with some nonpitting LEE  Neuro: 4- out of 5 upper extremity strength, flaccid lower extremities  Discharge Instructions      Discharge Instructions    (HEART FAILURE PATIENTS) Call MD:  Anytime you have any of the following symptoms: 1) 3  pound weight gain in 24 hours or 5 pounds in 1 week 2) shortness of breath, with or without a dry hacking cough 3) swelling in the hands, feet or stomach 4) if you have to sleep on extra pillows at night in order to breathe.    Complete by:  As directed      Call MD for:  difficulty breathing, headache or visual disturbances    Complete by:  As directed      Call MD for:  extreme fatigue    Complete by:  As directed      Call MD for:  hives    Complete by:  As directed      Call MD for:  persistant dizziness or light-headedness    Complete by:  As directed      Call MD for:  persistant nausea and vomiting    Complete by:  As directed      Call MD for:  severe uncontrolled pain    Complete by:  As directed      Call MD for:  temperature >100.4    Complete by:  As directed      Diet - low sodium heart healthy    Complete by:  As directed      Diet Carb Modified    Complete by:  As directed      Discharge instructions    Complete by:  As directed   Please stop your baclofen and your opana.  I have given you a prescription for oxycodone to use up to twice a day as needed.  Please talk to your primary care doctor about your chronic pain.  You should take one additional dose of fluconazole on Wednesday to help with your rash.  Finally, I have increased your omeprazole.  If you notice any bleeding or blood in your stools, seek immediate medical attention.  If you have blood in your stools with any additional symptoms such as lightheadedness, loss of consciousness/fainting, nausea, or pain, please call 911.     Driving Restrictions    Complete by:  As directed   No driving or operating heavy machinery            Medication List    STOP taking these medications        baclofen 10 MG tablet  Commonly known as:  LIORESAL     oxymorphone 20 MG 12 hr tablet  Commonly known as:  OPANA ER      TAKE these medications        acetaminophen 325 MG tablet  Commonly known as:  TYLENOL  Take 2  tablets (650 mg total) by mouth every 6 (six) hours as needed for mild pain (or Fever >/= 101).     ADVIL ALLERGY & CONGESTION 4-10-200 MG Tabs  Generic drug:  Chlorphen-Phenyleph-Ibuprofen  Take 1 tablet by mouth daily as needed (for allergy).     amLODipine 5 MG tablet  Commonly known as:  NORVASC  Take 1 tablet (5 mg total) by mouth daily.     apixaban 5 MG Tabs tablet  Commonly known as:  ELIQUIS  Take 1 tablet (5 mg total) by mouth 2 (two) times daily.     calcium-vitamin D 500-200 MG-UNIT tablet  Commonly known as:  OSCAL WITH D  Take 1 tablet by mouth daily with breakfast.     [employer] SPECTRAVITE ADULT 50+ PO  Take 1 tablet by mouth daily.     fluconazole 150 MG tablet  Commonly known as:  DIFLUCAN  Take 1 tablet (150 mg total) by mouth once. Please take on Wednesday.     gabapentin 300 MG capsule  Commonly known as:  NEURONTIN  Take 300 mg by mouth 3 (three) times daily.     glimepiride 2 MG tablet  Commonly known as:  AMARYL  Take 1 tablet (2 mg total) by mouth daily with breakfast.     LORazepam 1 MG tablet  Commonly known as:  ATIVAN  Take 0.5 tablets (0.5 mg total) by mouth every 12 (twelve) hours as needed for anxiety.     magnesium oxide 400 MG tablet  Commonly known as:  MAG-OX  Take 400 mg by mouth 2 (two) times daily.     omeprazole 20 MG capsule  Commonly known as:  PRILOSEC  Take 2 capsules (40 mg total) by mouth daily.     oxyCODONE 5 MG immediate release tablet  Commonly known as:  Oxy IR/ROXICODONE  Take 1 tablet (5 mg total) by mouth every 12 (twelve) hours as needed for severe pain.     potassium chloride SA 20 MEQ tablet  Commonly known as:  K-DUR,KLOR-CON  Take 1 tablet (20 mEq total) by mouth daily.  predniSONE 5 MG tablet  Commonly known as:  DELTASONE  Take 3 tablets (15 mg total) by mouth daily with breakfast. Resume after taper.     propranolol 10 MG tablet  Commonly known as:  INDERAL  Take 3 tablets (30 mg total) by mouth 3  (three) times daily.     rOPINIRole 0.5 MG tablet  Commonly known as:  REQUIP  Take 0.5 mg by mouth 2 (two) times daily. Give 1 tablet 1-3 hours before bedtime (around 1900) and than 1 tablet at bedtime (around 2200)     rosuvastatin 10 MG tablet  Commonly known as:  CRESTOR  Take 10 mg by mouth daily.     saccharomyces boulardii 250 MG capsule  Commonly known as:  FLORASTOR  Take 1 capsule (250 mg total) by mouth 2 (two) times daily.     tamsulosin 0.4 MG Caps capsule  Commonly known as:  FLOMAX  Take 0.4 mg by mouth at bedtime.     torsemide 20 MG tablet  Commonly known as:  DEMADEX  Take 1 tablet (20 mg total) by mouth daily.     traZODone 50 MG tablet  Commonly known as:  DESYREL  Take 50 mg by mouth at bedtime.     trifluoperazine 5 MG tablet  Commonly known as:  STELAZINE  Take 5 mg by mouth 2 (two) times daily as needed (for itching).       Follow-up Information    Follow up with [name], MD. Schedule an appointment as soon as possible for a visit in 1 week.   Specialty:  Family Medicine   Contact information:   [address] [place] [address] [phone]        The results of significant diagnostics from this hospitalization (including imaging, microbiology, ancillary and laboratory) are listed below for reference.    Significant Diagnostic Studies: Dg Chest 2 View  08/09/2015  CLINICAL DATA:  Generalized weakness, fever EXAM: CHEST  2 VIEW COMPARISON:  04/30/2015 FINDINGS: Borderline cardiomegaly. No infiltrate or pulmonary edema. Bony thorax is unremarkable. IMPRESSION: No active cardiopulmonary disease.  Borderline cardiomegaly. Electronically Signed   By: Natasha Mead M.D.   On: 08/09/2015 11:41   Ct Head Wo Contrast  08/18/2015  CLINICAL DATA:  Altered mental status and sepsis EXAM: CT HEAD WITHOUT CONTRAST TECHNIQUE: Contiguous axial images were obtained from the base of the skull through the vertex without intravenous contrast. COMPARISON:   08/09/2015 FINDINGS: Skull and Sinuses:Negative for fracture or destructive process. The visualized mastoids, middle ears, and imaged paranasal sinuses are clear. Visualized orbits: Negative. Brain: Normal appearance for age. No evidence of acute infarction, hemorrhage, hydrocephalus, or mass lesion/mass effect. Calcified vertebral and carotid atherosclerosis. IMPRESSION: No acute finding.  Unremarkable head CT for age. Electronically Signed   By: Marnee Spring M.D.   On: 08/18/2015 11:04   Ct Head Wo Contrast  08/09/2015  CLINICAL DATA:  Altered mental status, headache, recent pneumonia, hypertension, quadriplegia, history CHF EXAM: CT HEAD WITHOUT CONTRAST TECHNIQUE: Contiguous axial images were obtained from the base of the skull through the vertex without intravenous contrast. COMPARISON:  04/17/2015 FINDINGS: Mild generalized atrophy. Normal ventricular morphology. No midline shift or mass effect. Otherwise normal appearance of brain parenchyma. No intracranial hemorrhage, mass lesion or evidence acute infarction. No extra-axial fluid collection. Sinuses clear and bones unremarkable. Atherosclerotic calcifications at the carotid siphons. IMPRESSION: No acute intracranial abnormalities. Electronically Signed   By: Ulyses Southward M.D.   On: 08/09/2015 17:32  Ct Angio Chest Pe W/cm &/or Wo Cm  08/11/2015  CLINICAL DATA:  Hypoxia and possible aspiration. Evaluate for pulmonary embolism. Patient is quadriplegic. EXAM: CT ANGIOGRAPHY CHEST WITH CONTRAST TECHNIQUE: Multidetector CT imaging of the chest was performed using the standard protocol during bolus administration of intravenous contrast. Multiplanar CT image reconstructions and MIPs were obtained to evaluate the vascular anatomy. CONTRAST:  OMNIPAQUE IOHEXOL 350 MG/ML SOLN COMPARISON:  04/07/2015 FINDINGS: Mediastinum/Lymph Nodes: Negative for pulmonary embolism. Normal caliber of the thoracic aorta without dissection. Mild atherosclerotic plaque at  the aortic arch. Great vessels are patent. No significant chest lymphadenopathy. There are coronary artery calcifications. No significant pericardial fluid. Lungs/Pleura: Trachea and mainstem bronchi are patent. Again noted are a few dependent and basilar lung densities which are similar to the previous examination and probably represent a combination of scarring and atelectasis. There is a 5 mm subtle density in the right upper lobe. This nodular structure has a flat configuration on the coronal and sagittal reformats. In addition, there is probably a small focus of calcium within this structure on based on the soft tissue windows. This structure is present on sequence 7, image 36. There is also punctate nodular density along the right minor fissure which is stable on sequence 7, image 44. Stable 5 mm nodular density in the right lower lobe superior segment on sequence 7, image 47. There is mild pleural thickening, particularly on left side. No significant pleural effusions. Upper abdomen: Low-attenuation of the liver is suggestive for hepatic steatosis. No acute abnormalities in the upper abdomen. Musculoskeletal: No acute bone abnormality. Review of the MIP images confirms the above findings. IMPRESSION: No acute chest abnormality. Negative for a pulmonary embolism. No evidence for an aortic dissection. Small pulmonary nodules as described. No significantly changed since 04/17/2015. At least 1 of these nodules may represent a calcified granuloma. Largest nodule measures up to 5 mm. If the patient is at high risk for bronchogenic carcinoma, follow-up chest CT at 6-12 months is recommended. If the patient is at low risk for bronchogenic carcinoma, follow-up chest CT at 12 months is recommended. This recommendation follows the consensus statement: Guidelines for Management of Small Pulmonary Nodules Detected on CT Scans: A Statement from the Fleischner Society as published in Radiology 2005;237:395-400.  Electronically Signed   By: Richarda Overlie M.D.   On: 08/11/2015 09:20   Ct Abdomen Pelvis W Contrast  08/09/2015  CLINICAL DATA:  70 year old long-time paraplegic due to remote spinal cord injury, presenting with acute onset of generalized abdominal pain and nausea which began yesterday. Patient diagnosed with pneumonia approximately 2 weeks ago for which she is completing antibiotic therapy. EXAM: CT ABDOMEN AND PELVIS WITH CONTRAST TECHNIQUE: Multidetector CT imaging of the abdomen and pelvis was performed using the standard protocol following bolus administration of intravenous contrast. CONTRAST:  OMNIPAQUE IOHEXOL 300 MG/ML IV. Oral contrast was also administered. COMPARISON:  04/24/2015, 03/18/2007. FINDINGS: Respiratory motion blurs images of the lower chest and upper abdomen. The study appears diagnostic, however. Lower chest: Linear scarring in the lower lobes, unchanged. Visualized lung bases otherwise clear. The 4 mm left lower lobe nodule identified on the October, 2016 examination was not imaged today. Heart mildly to moderately enlarged with a very large amount of epicardial fat as noted previously. Hepatobiliary: Mild hepatomegaly with diffuse hepatic steatosis with focal sparing surrounding the gallbladder and scattered areas of focal sparing peripherally in both lobes, less severe than on the October, 2016 examination. No significant focal hepatic parenchymal  abnormality. Gallbladder normal in appearance without calcified gallstones. No biliary ductal dilation. Pancreas: Normal in appearance without evidence of mass, ductal dilation, or inflammation. Spleen: Normal in size and appearance. Adrenals/Urinary Tract: Normal appearing adrenal glands. Numerous cortical cysts involving both kidneys, the largest arising from the lower pole of the left kidney measuring approximately 4.5 cm. All mild diffuse cortical thinning involving both kidneys as noted previously. Atherosclerotic calcification  involving the distal right main renal artery at the right renal hilus. No hydronephrosis. No urinary tract calculi. Urinary bladder unremarkable. Stomach/Bowel: Stomach normal in appearance for the degree of distention. Normal-appearing small bowel. Descending and sigmoid colon diverticulosis without evidence of acute diverticulitis. Moderate colonic stool burden. Surgical clips at the cecum from prior appendectomy. 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 Visceral arteries patent though atherosclerotic. No pathologic lymphadenopathy. Reproductive: Uterus and ovaries surgically absent. No adnexal masses. Other: Atrophic paraspinous muscles and visualized lower extremity muscles indicating long-standing tissues. Musculoskeletal: Osseous demineralization. Degenerative disc disease at L3-4 L4-5. Degenerative retrolisthesis of L4 relative to L5. Severe diffuse facet degenerative changes involving the lumbar spine. No acute abnormality. IMPRESSION: 1. No acute abnormalities involving the abdomen or pelvis. 2. Descending and sigmoid colon diverticulosis without evidence of acute diverticulitis. 3. Mild hepatomegaly with diffuse hepatic steatosis. Focal sparing surrounding the gallbladder and peripherally in both hepatic lobes. No significant focal hepatic parenchymal abnormality. Steatosis is less severe than on the October, 2016 CT. 4. 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 Electronically Signed   By: Hulan Saas M.D.   On: 08/09/2015 13:32   Dg Chest Port 1 View  08/18/2015  CLINICAL DATA:  70 year old female with acute shortness of breath today. EXAM: PORTABLE CHEST 1 VIEW COMPARISON:  08/10/2015 and prior exams FINDINGS: This is a low volume film. Cardiomegaly and mild pulmonary vascular congestion noted. Mild elevation the right hemidiaphragm is again noted. Minimal bibasilar atelectasis is unchanged. There is no evidence of pneumothorax or  definite pleural effusion. No acute bony abnormalities are noted. IMPRESSION: Low volume film with cardiomegaly, mild pulmonary vascular congestion and minimal bibasilar atelectasis. Electronically Signed   By: Harmon Pier M.D.   On: 08/18/2015 11:06   Dg Chest Port 1 View  08/10/2015  CLINICAL DATA:  Shortness of Breath EXAM: PORTABLE CHEST 1 VIEW COMPARISON:  08/09/2015 FINDINGS: Cardiac shadow is mildly enlarged. The lungs are well aerated bilaterally. No focal infiltrate or sizable effusion is seen. No bony abnormality is noted. IMPRESSION: No active disease. Electronically Signed   By: Alcide Clever M.D.   On: 08/10/2015 09:10    Microbiology: Recent Results (from the past 240 hour(s))  Urine culture     Status: None   Collection Time: 08/18/15 10:07 AM  Result Value Ref Range Status   Specimen Description URINE, CATHETERIZED  Final   Special Requests NONE  Final   Culture   Final    NO GROWTH 1 DAY Performed at [facility]    Report Status 08/19/2015 FINAL  Final  Blood Culture (routine x 2)     Status: None (Preliminary result)   Collection Time: 08/18/15 10:29 AM  Result Value Ref Range Status   Specimen Description BLOOD RIGHT WRIST  Final   Special Requests IN PEDIATRIC BOTTLE 1CC  Final   Culture   Final    NO GROWTH 2 DAYS Performed at [facility]    Report Status PENDING  Incomplete  Blood Culture (routine x 2)     Status: None (Preliminary result)  Collection Time: 08/18/15 10:35 AM  Result Value Ref Range Status   Specimen Description BLOOD RIGHT ANTECUBITAL  Final   Special Requests BOTTLES DRAWN AEROBIC ONLY 5CC  Final   Culture   Final    NO GROWTH 2 DAYS Performed at [facility]    Report Status PENDING  Incomplete  MRSA PCR Screening     Status: None   Collection Time: 08/18/15  7:19 PM  Result Value Ref Range Status   MRSA by PCR NEGATIVE NEGATIVE Final    Comment:        The GeneXpert MRSA Assay (FDA approved for NASAL  specimens only), is one component of a comprehensive MRSA colonization surveillance program. It is not intended to diagnose MRSA infection nor to guide or monitor treatment for MRSA infections.      Labs: Basic Metabolic Panel:  Recent Labs Lab 08/15/15 0542 08/16/15 0453 08/18/15 1035 08/19/15 0212 08/20/15 0416 08/21/15 0557  NA 141 139 142 142 142 142  K 4.2 3.8 4.2 3.7 2.8* 3.7  CL 98* 93* 92* 98* 100* 100*  CO2 30 37* 41* 32 31 32  GLUCOSE 204* 145* 160* 197* 306* 142*  BUN 18 23* 23* 15 10 19   CREATININE 0.57 0.72 0.63 0.58 0.56 0.70  CALCIUM 9.6 9.1 8.5* 8.5* 8.5* 8.8*  MG 2.0  --   --  2.0 1.9 1.8  PHOS  --   --   --  2.5  --   --    Liver Function Tests:  Recent Labs Lab 08/18/15 1035  AST 96*  ALT 87*  ALKPHOS 56  BILITOT 0.7  PROT 5.8*  ALBUMIN 3.3*   No results for input(s): LIPASE, AMYLASE in the last 168 hours. No results for input(s): AMMONIA in the last 168 hours. CBC:  Recent Labs Lab 08/15/15 0542 08/18/15 1035 08/19/15 0212 08/20/15 0416 08/21/15 0557  WBC 11.5* 17.5* 13.9* 9.8 15.4*  NEUTROABS  --  14.0*  --   --   --   HGB 14.6 13.4 12.6 13.0 12.8  HCT 47.5* 45.0 41.4 41.2 42.1  MCV 87.5 91.5 87.5 87.5 88.3  PLT 246 242 238 286 310   Cardiac Enzymes: No results for input(s): CKTOTAL, CKMB, CKMBINDEX, TROPONINI in the last 168 hours. BNP: BNP (last 3 results)  Recent Labs  04/30/15 1120 08/09/15 1050 08/18/15 1035  BNP 46.4 130.8* 166.4*    ProBNP (last 3 results) No results for input(s): PROBNP in the last 8760 hours.  CBG:  Recent Labs Lab 08/20/15 1203 08/20/15 1743 08/20/15 2226 08/21/15 0742 08/21/15 1218  GLUCAP 322* 231* 348* 117* 252*    Time coordinating discharge: 35 minutes  Signed:  Ethanjames Fontenot  [facility] 08/21/2015, 12:30 PM

## 2015-08-22 LAB — RESPIRATORY VIRUS PANEL
ADENOVIRUS: NEGATIVE
Influenza A: NEGATIVE
Influenza B: NEGATIVE
METAPNEUMOVIRUS: NEGATIVE
PARAINFLUENZA 1 A: NEGATIVE
PARAINFLUENZA 3 A: NEGATIVE
Parainfluenza 2: NEGATIVE
RHINOVIRUS: NEGATIVE
Respiratory Syncytial Virus A: NEGATIVE
Respiratory Syncytial Virus B: NEGATIVE

## 2015-08-23 LAB — CULTURE, BLOOD (ROUTINE X 2)
CULTURE: NO GROWTH
Culture: NO GROWTH

## 2015-10-10 ENCOUNTER — Emergency Department (HOSPITAL_COMMUNITY): Payer: Worker's Compensation

## 2015-10-10 ENCOUNTER — Encounter (HOSPITAL_COMMUNITY): Payer: Self-pay | Admitting: Emergency Medicine

## 2015-10-10 ENCOUNTER — Inpatient Hospital Stay (HOSPITAL_COMMUNITY)
Admission: EM | Admit: 2015-10-10 | Discharge: 2015-10-12 | DRG: 189 | Disposition: A | Discharge status: Home / Self Care | Payer: Worker's Compensation | Attending: Internal Medicine | Admitting: Internal Medicine

## 2015-10-10 DIAGNOSIS — Z7901 Long term (current) use of anticoagulants: Secondary | ICD-10-CM

## 2015-10-10 DIAGNOSIS — K219 Gastro-esophageal reflux disease without esophagitis: Secondary | ICD-10-CM | POA: Diagnosis present

## 2015-10-10 DIAGNOSIS — Q828 Other specified congenital malformations of skin: Secondary | ICD-10-CM

## 2015-10-10 DIAGNOSIS — R000 Tachycardia, unspecified: Secondary | ICD-10-CM | POA: Diagnosis present

## 2015-10-10 DIAGNOSIS — I480 Paroxysmal atrial fibrillation: Secondary | ICD-10-CM | POA: Diagnosis present

## 2015-10-10 DIAGNOSIS — I472 Ventricular tachycardia, unspecified: Secondary | ICD-10-CM

## 2015-10-10 DIAGNOSIS — Z79899 Other long term (current) drug therapy: Secondary | ICD-10-CM

## 2015-10-10 DIAGNOSIS — E785 Hyperlipidemia, unspecified: Secondary | ICD-10-CM | POA: Diagnosis present

## 2015-10-10 DIAGNOSIS — G8929 Other chronic pain: Secondary | ICD-10-CM | POA: Diagnosis present

## 2015-10-10 DIAGNOSIS — Z87891 Personal history of nicotine dependence: Secondary | ICD-10-CM

## 2015-10-10 DIAGNOSIS — J9601 Acute respiratory failure with hypoxia: Principal | ICD-10-CM | POA: Diagnosis present

## 2015-10-10 DIAGNOSIS — I313 Pericardial effusion (noninflammatory): Secondary | ICD-10-CM | POA: Diagnosis present

## 2015-10-10 DIAGNOSIS — E119 Type 2 diabetes mellitus without complications: Secondary | ICD-10-CM

## 2015-10-10 DIAGNOSIS — Z6836 Body mass index (BMI) 36.0-36.9, adult: Secondary | ICD-10-CM

## 2015-10-10 DIAGNOSIS — I5032 Chronic diastolic (congestive) heart failure: Secondary | ICD-10-CM | POA: Diagnosis not present

## 2015-10-10 DIAGNOSIS — Z7952 Long term (current) use of systemic steroids: Secondary | ICD-10-CM

## 2015-10-10 DIAGNOSIS — R0600 Dyspnea, unspecified: Secondary | ICD-10-CM

## 2015-10-10 DIAGNOSIS — R0602 Shortness of breath: Secondary | ICD-10-CM | POA: Diagnosis not present

## 2015-10-10 DIAGNOSIS — N390 Urinary tract infection, site not specified: Secondary | ICD-10-CM | POA: Diagnosis present

## 2015-10-10 DIAGNOSIS — F419 Anxiety disorder, unspecified: Secondary | ICD-10-CM | POA: Diagnosis present

## 2015-10-10 DIAGNOSIS — G8220 Paraplegia, unspecified: Secondary | ICD-10-CM | POA: Diagnosis present

## 2015-10-10 DIAGNOSIS — Z79891 Long term (current) use of opiate analgesic: Secondary | ICD-10-CM

## 2015-10-10 DIAGNOSIS — R6510 Systemic inflammatory response syndrome (SIRS) of non-infectious origin without acute organ dysfunction: Secondary | ICD-10-CM | POA: Diagnosis present

## 2015-10-10 DIAGNOSIS — I110 Hypertensive heart disease with heart failure: Secondary | ICD-10-CM | POA: Diagnosis present

## 2015-10-10 DIAGNOSIS — Z87440 Personal history of urinary (tract) infections: Secondary | ICD-10-CM

## 2015-10-10 DIAGNOSIS — F329 Major depressive disorder, single episode, unspecified: Secondary | ICD-10-CM | POA: Diagnosis present

## 2015-10-10 DIAGNOSIS — R140 Abdominal distension (gaseous): Secondary | ICD-10-CM | POA: Diagnosis not present

## 2015-10-10 DIAGNOSIS — E662 Morbid (severe) obesity with alveolar hypoventilation: Secondary | ICD-10-CM

## 2015-10-10 DIAGNOSIS — G8250 Quadriplegia, unspecified: Secondary | ICD-10-CM | POA: Diagnosis present

## 2015-10-10 DIAGNOSIS — A419 Sepsis, unspecified organism: Secondary | ICD-10-CM | POA: Diagnosis present

## 2015-10-10 DIAGNOSIS — Z888 Allergy status to other drugs, medicaments and biological substances status: Secondary | ICD-10-CM

## 2015-10-10 DIAGNOSIS — R251 Tremor, unspecified: Secondary | ICD-10-CM | POA: Diagnosis present

## 2015-10-10 LAB — COMPREHENSIVE METABOLIC PANEL
ALT: 28 U/L (ref 14–54)
ANION GAP: 13 (ref 5–15)
AST: 28 U/L (ref 15–41)
Albumin: 3.7 g/dL (ref 3.5–5.0)
Alkaline Phosphatase: 63 U/L (ref 38–126)
BUN: 12 mg/dL (ref 6–20)
CHLORIDE: 96 mmol/L — AB (ref 101–111)
CO2: 32 mmol/L (ref 22–32)
Calcium: 9.2 mg/dL (ref 8.9–10.3)
Creatinine, Ser: 0.73 mg/dL (ref 0.44–1.00)
GFR calc non Af Amer: >60 mL/min (ref >60)
Glucose, Bld: 247 mg/dL — ABNORMAL HIGH (ref 65–99)
Potassium: 3.8 mmol/L (ref 3.5–5.1)
SODIUM: 141 mmol/L (ref 135–145)
Total Bilirubin: 0.6 mg/dL (ref 0.3–1.2)
Total Protein: 6.5 g/dL (ref 6.5–8.1)

## 2015-10-10 LAB — CBC
HCT: 46.6 % — ABNORMAL HIGH (ref 36.0–46.0)
HEMOGLOBIN: 14.9 g/dL (ref 12.0–15.0)
MCH: 27.3 pg (ref 26.0–34.0)
MCHC: 32 g/dL (ref 30.0–36.0)
MCV: 85.3 fL (ref 78.0–100.0)
Platelets: 241 10*3/uL (ref 150–400)
RBC: 5.46 MIL/uL — AB (ref 3.87–5.11)
RDW: 14.9 % (ref 11.5–15.5)
WBC: 16.5 10*3/uL — ABNORMAL HIGH (ref 4.0–10.5)

## 2015-10-10 LAB — URINALYSIS, ROUTINE W REFLEX MICROSCOPIC
Bilirubin Urine: NEGATIVE
GLUCOSE, UA: 500 mg/dL — AB
Ketones, ur: NEGATIVE mg/dL
Nitrite: NEGATIVE
Protein, ur: 30 mg/dL — AB
SPECIFIC GRAVITY, URINE: 1.01 (ref 1.005–1.030)
pH: 6.5 (ref 5.0–8.0)

## 2015-10-10 LAB — URINE MICROSCOPIC-ADD ON

## 2015-10-10 LAB — PROTIME-INR
INR: 1.18 (ref 0.00–1.49)
PROTHROMBIN TIME: 14.7 s (ref 11.6–15.2)

## 2015-10-10 LAB — BRAIN NATRIURETIC PEPTIDE: B NATRIURETIC PEPTIDE 5: 72.9 pg/mL (ref 0.0–100.0)

## 2015-10-10 LAB — LIPASE, BLOOD: LIPASE: 42 U/L (ref 11–51)

## 2015-10-10 MED ORDER — METHYLPREDNISOLONE SODIUM SUCC 125 MG IJ SOLR
125.0000 mg | Freq: Once | INTRAMUSCULAR | Status: AC
  Administered 2015-10-11: 125 mg via INTRAVENOUS
  Filled 2015-10-10: qty 2

## 2015-10-10 NOTE — ED Notes (Signed)
Bed: [id number] Expected date:  Expected time:  Means of arrival:  Comments: EMS 70 yo female from home, SOB and abdominal pain

## 2015-10-10 NOTE — ED Notes (Signed)
Pt BIB from home; pt awoke at 1am with SOB which has worsened throughout the day; abdominal pain and distention has also developed and worsened throughout the day; pt had SpO2 in upper 80's on RA; is currently on 4L's with SpO2 of 97%; pt is paralyzed from waist down and stays in hospital bed at home.

## 2015-10-10 NOTE — H&P (Signed)
[facility] History and Physical  [name] [name] ZOX:[id number] DOB: 11/21/1945 DOA: 10/10/2015  Referring physician: ED physician PCP: [name], MD  Specialists:   Chief Complaint: Shortness of breath and abdominal distention  HPI: [name] [name] is a 70 y.o. female with PMH of hypertension, hyperlipidemia, diabetes mellitus, GERD, depression, anxiety, quadriplegia, diastolic congestive heart failure, recurrent UTI, history of Klebsiella sepsis, hx of positive urine culture with ESBL, atrial fibrillation on Eliquis, who presents with shortness of breath.  Patient reports that she started having severe shortness of breath at 1:00 a.m. She does not have fever, chills, chest pain, cough. She reports that she has abdominal distention, but no nausea, vomiting, diarrhea or abdominal pain. She has small amount of bowel movement today, and is passing gas. Patient denies symptoms of UTI.   In ED, patient was found to have oxygen desaturated to 80% on room air, lactate 2.2, leukocytosis with WBC 16.5, temperature 99.1, tachycardia, tachypnea, INR 1.18, BNP 72.9, lipase of 42, positive urinalysis with large amount of leukocytes, negative chest x-ray for acute abnormalities, electrolytes and renal function okay. X-ray of abdomen showed normal bowel gas pattern and moderate stool burden. Patient is admitted to inpatient for further intervention treatment.  EKG: Independently reviewed. QTc 448, nonspecific T-wave change.  Where does patient live?   At home   Can patient participate in ADLs?  None  Review of Systems:   General: no fevers, chills, no changes in body weight, has poor appetite, has fatigue HEENT: no blurry vision, hearing changes or sore throat Pulm: has dyspnea, no coughing, has wheezing CV: no chest pain, no palpitations Abd: has abdominal distention, no nausea, vomiting, abdominal pain, diarrhea, constipation GU: no dysuria, burning on urination, increased urinary frequency,  hematuria  Ext: has trace leg edema Neuro: has quadriplegia, no vision change or hearing loss Skin: has porokeratosis MSK: No muscle spasm, no deformity, no limitation of range of movement in spin Heme: No easy bruising.  Travel history: No recent long distant travel.  Allergy:  Allergies  Allergen Reactions  . Decadrol [Dexamethasone] Swelling    Swelling of face and stomach   . Sodium Phosphate Swelling    Swelling of face and stomach     Past Medical History  Diagnosis Date  . GERD (gastroesophageal reflux disease)   . Quadriplegia (HCC)     a. 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  . Hypertension   . Pressure ulcer of foot   . Chronic pain disorder   . Hyperlipidemia   . Occasional tremors   . Chronic diastolic CHF (congestive heart failure) (HCC)   . Diabetes mellitus (HCC)   . History of frequent urinary tract infections   . History of ESBL E. coli infection   . Sepsis (HCC)     a. several prior adm for sepsis.  . Atrial fibrillation (HCC)     a. Observed during 08/2015 admission.  . Pericardial effusion     a. Observed during 08/2015 admission - moderate.  . Pulmonary nodules     Past Surgical History  Procedure Laterality Date  . Back surgery  08/2013  . Abdominal hysterectomy      Social History:  reports that she has quit smoking. Her smoking use included Cigarettes. She smoked 0.25 packs per day. She has never used smokeless tobacco. She reports that she does not drink alcohol or use illicit drugs.  Family History:  Family History  Problem  Relation Age of Onset  . Adopted: Yes     Prior to Admission medications   Medication Sig Start Date End Date Taking? Authorizing Provider  acetaminophen (TYLENOL) 325 MG tablet Take 2 tablets (650 mg total) by mouth every 6 (six) hours as needed for mild pain (or Fever >/= 101). 05/03/15  Yes Belkys A Regalado, MD  amLODipine (NORVASC) 5 MG tablet Take 1  tablet (5 mg total) by mouth daily. 08/16/15  Yes Rodolph Bong, MD  apixaban (ELIQUIS) 5 MG TABS tablet Take 1 tablet (5 mg total) by mouth 2 (two) times daily. 08/16/15  Yes Rodolph Bong, MD  baclofen (LIORESAL) 10 MG tablet TAKE 1 TABLET BY MOUTH BID FOR MUSCLE STRAIN 09/07/15  Yes Historical Provider, MD  calcium-vitamin D (OSCAL WITH D) 500-200 MG-UNIT per tablet Take 1 tablet by mouth daily with breakfast.   Yes Historical Provider, MD  Chlorphen-Phenyleph-Ibuprofen (ADVIL ALLERGY & CONGESTION) 4-10-200 MG TABS Take 1 tablet by mouth daily as needed (for allergy).   Yes Historical Provider, MD  gabapentin (NEURONTIN) 300 MG capsule Take 300 mg by mouth 3 (three) times daily.   Yes Historical Provider, MD  glimepiride (AMARYL) 2 MG tablet Take 1 tablet (2 mg total) by mouth daily with breakfast. 08/16/15  Yes Rodolph Bong, MD  LORazepam (ATIVAN) 1 MG tablet Take 0.5 tablets (0.5 mg total) by mouth every 12 (twelve) hours as needed for anxiety. Patient taking differently: Take 1 mg by mouth 2 (two) times daily.  05/03/15  Yes Belkys A Regalado, MD  magnesium oxide (MAG-OX) 400 MG tablet Take 400 mg by mouth 2 (two) times daily.   Yes Historical Provider, MD  Multiple Vitamins-Minerals ([employer] SPECTRAVITE ADULT 50+ PO) Take 1 tablet by mouth daily.   Yes Historical Provider, MD  omeprazole (PRILOSEC) 20 MG capsule Take 2 capsules (40 mg total) by mouth daily. 08/21/15  Yes Renae Fickle, MD  Oxycodone HCl 20 MG TABS Take 0.5 tablets by mouth 2 (two) times daily.  09/16/15  Yes Historical Provider, MD  potassium chloride SA (K-DUR,KLOR-CON) 20 MEQ tablet Take 1 tablet (20 mEq total) by mouth daily. 05/03/15  Yes Belkys A Regalado, MD  predniSONE (DELTASONE) 5 MG tablet Take 3 tablets (15 mg total) by mouth daily with breakfast. Resume after taper. 08/21/15  Yes Rodolph Bong, MD  propranolol (INDERAL) 10 MG tablet Take 3 tablets (30 mg total) by mouth 3 (three) times daily. 08/16/15  Yes Rodolph Bong, MD  rOPINIRole (REQUIP) 0.5 MG tablet Take 0.5 mg by mouth 2 (two) times daily. Give 1 tablet 1-3 hours before bedtime (around 1900) and than 1 tablet at bedtime (around 2200)   Yes Historical Provider, MD  rosuvastatin (CRESTOR) 10 MG tablet Take 10 mg by mouth daily. 08/07/15  Yes Historical Provider, MD  saccharomyces boulardii (FLORASTOR) 250 MG capsule Take 1 capsule (250 mg total) by mouth 2 (two) times daily. 04/27/15  Yes Vassie Loll, MD  tamsulosin (FLOMAX) 0.4 MG CAPS capsule Take 0.4 mg by mouth at bedtime.    Yes Historical Provider, MD  torsemide (DEMADEX) 20 MG tablet Take 1 tablet (20 mg total) by mouth daily. 05/03/15  Yes Belkys A Regalado, MD  traZODone (DESYREL) 50 MG tablet Take 50 mg by mouth at bedtime. 04/14/15  Yes Historical Provider, MD  fluconazole (DIFLUCAN) 150 MG tablet Take 1 tablet (150 mg total) by mouth once. Please take on Wednesday. Patient not taking: Reported on 10/10/2015 08/21/15  Renae FickleMackenzie Short, MD  oxyCODONE (OXY IR/ROXICODONE) 5 MG immediate release tablet Take 1 tablet (5 mg total) by mouth every 12 (twelve) hours as needed for severe pain. Patient not taking: Reported on 10/10/2015 08/21/15   Renae FickleMackenzie Short, MD  trifluoperazine (STELAZINE) 5 MG tablet Take 5 mg by mouth 2 (two) times daily as needed (for itching).    Historical Provider, MD    Physical Exam: Filed Vitals:   10/10/15 2308 10/11/15 0000 10/11/15 0129 10/11/15 0510  BP: 147/88 145/75 125/76 121/71  Pulse: 100 99 98 96  Temp:   99.1 F (37.3 C) 97.8 F (36.6 C)  TempSrc:   Oral Oral  Resp: 16 25 22 22   Weight:   85.9 kg (189 lb 6 oz)   SpO2: 98% 97% 98% 97%   General: Not in acute distress HEENT:       Eyes: PERRL, EOMI, no scleral icterus.       ENT: No discharge from the ears and nose, no pharynx injection, no tonsillar enlargement.        Neck: No JVD, no bruit, no mass felt. Heme: No neck lymph node enlargement. Cardiac: S1/S2, RRR, No murmurs, No gallops or  rubs. Pulm: has mild wheezing bilaterally, No rales or rubs. Abd: distended, nontender, no rebound pain, no organomegaly, BS present. Ext: has trace leg edema bilaterally. 2+DP/PT pulse bilaterally. Musculoskeletal: No joint deformities, No joint redness or warmth, no limitation of ROM in spin. Skin: has porokeratosis rashes in all extremities Neuro: Alert, oriented X3, cranial nerves II-XII grossly intact, has quadriplegia. Psych: Patient is not psychotic, no suicidal or hemocidal ideation.  Labs on Admission:  Basic Metabolic Panel:  Recent Labs Lab 10/10/15 2214 10/11/15 0514  NA 141 140  K 3.8 4.3  CL 96* 101  CO2 32 28  GLUCOSE 247* 346*  BUN 12 12  CREATININE 0.73 0.57  CALCIUM 9.2 8.2*   Liver Function Tests:  Recent Labs Lab 10/10/15 2214  AST 28  ALT 28  ALKPHOS 63  BILITOT 0.6  PROT 6.5  ALBUMIN 3.7    Recent Labs Lab 10/10/15 2214  LIPASE 42   No results for input(s): AMMONIA in the last 168 hours. CBC:  Recent Labs Lab 10/10/15 2214 10/11/15 0514  WBC 16.5* 15.0*  HGB 14.9 13.8  HCT 46.6* 43.8  MCV 85.3 84.2  PLT 241 215   Cardiac Enzymes: No results for input(s): CKTOTAL, CKMB, CKMBINDEX, TROPONINI in the last 168 hours.  BNP (last 3 results)  Recent Labs  08/09/15 1050 08/18/15 1035 10/10/15 2214  BNP 130.8* 166.4* 72.9    ProBNP (last 3 results) No results for input(s): PROBNP in the last 8760 hours.  CBG:  Recent Labs Lab 10/11/15 0206  GLUCAP 263*    Radiological Exams on Admission: Dg Chest 2 View  10/10/2015  CLINICAL DATA:  Dyspnea, onset early this morning and worsening throughout today. EXAM: CHEST  2 VIEW COMPARISON:  08/18/2015 FINDINGS: There is unchanged mild cardiomegaly. The lungs are clear. There is no pleural effusion. The pulmonary vasculature is normal. IMPRESSION: No active cardiopulmonary disease. Electronically Signed   By: Ellery Plunkaniel R Mitchell M.D.   On: 10/10/2015 22:03   Dg Abd 1 View  10/10/2015   CLINICAL DATA:  Diffuse abdominal pain and distention, onset today. EXAM: ABDOMEN - 1 VIEW COMPARISON:  CT 08/09/2015 FINDINGS: The bowel gas pattern is normal. Moderate stool burden. Enteric contrast within diverticula in the left colon. Oblong densities in the colon are likely related  to pills. Portions of the right abdomen excluded from the field of view. No evidence of free air on supine view. Surgical clips in the right abdomen. There is degenerative change in the spine. IMPRESSION: Normal bowel gas pattern.  Moderate stool burden. Electronically Signed   By: Rubye Oaks M.D.   On: 10/10/2015 23:14    Assessment/Plan Principal Problem:   Acute respiratory failure with hypoxia (HCC) Active Problems:   Porokeratosis   UTI (lower urinary tract infection)   Sepsis (HCC)   Quadriplegia (HCC)   PAF (paroxysmal atrial fibrillation) (HCC)   Chronic diastolic CHF (congestive heart failure) (HCC)   Type 2 diabetes, HbA1c goal < 7% (HCC)   Abdominal distension   Lower paraplegia (HCC)  Sepsis: Patient is a septic on admission with a leukocytosis, tachycardia, tachypnea and elevated lactate. Currently hemodynamically stable. The etiology is not clear, likely due to UTI and possible bronchitis. Though patient does not have symptoms of UTI, her urinalysis is positive with large amount of leukocytes. Given her history of quadriplegia, patient may not be able to feel the symptoms. She has history of Klebsiella sepsis and positive urine culture with ESBL. Patient needs antibiotics with broad coverage. Currently hemodynamically stable.  -will admit to tele bed for observation -start IV vanco and zosyn -will trend lactic acid levels per sepsis protocol. -IVF: 2.5L of NS bolus in ED, followed by 75 cc/h (patient has congestive heart failure, limiting aggressive IV fluids treatment). -f/u Bx and Ux  UTI:  -as above  Acute respiratory failure with hypoxia [facility]): Etiology is not clear. Chest x-ray is  negative for pneumonia. Differential diagnosis includes bronchitis given wheezing on auscultation, hypoventilation secondary to severely distended abdomen. Patient is on Eliquis, less likley to have a pulmonary embolism. Her BNP is 72.9, has only trace amount of leg edema, less likely to have CHF excess of patient. Patient received 1 dose of Solu-Medrol in the emergency room. -On IV vanco and zosyn as above -Duonebs, mucinex -check flu pcr and respiratory virus panel  PAF: CHA2DS2-VASc Score is 5, needs oral anticoagulation. Patient is on Eliquis at home. Heart rate is 90 to 100 which is OK.  -continue Eliquis and propranolol  Chronic diastolic CHF (congestive heart failure): 2-D echo on 08/12/68 initially of 3 to 5-70 percent. Patient has a trace amount of leg edema, sedatives compensated. -Hold Lasix due to sepsis -Continue propranolol  DM-II: Last A1c 9.3 on 08/09/15, poorly controled. Patient is taking Amaryl at home -SSI  Abdominal extension: Etiology is not clear. X-ray of abdomen showed stool burden, but cannot rule out other possibilities, such as small bowel obstruction. -CT-abd/pelvis  Porokeratosis -continue prednisone -check cortisol level   DVT ppx: on Eliquis  Code Status: Full code Family Communication: None at bed side.  Disposition Plan: Admit to inpatient   Date of Service 10/11/2015    Lorretta Harp [facility] Pager [phone]  If 7PM-7AM, please contact night-coverage [email or web address] Password [email or web address] 10/11/2015, 6:59 AM

## 2015-10-10 NOTE — ED Provider Notes (Signed)
CSN: [id number]     Arrival date & time 10/10/15  2004 History   First MD Initiated Contact with Patient 10/10/15 2102     Chief Complaint  Patient presents with  . Shortness of Breath  . Abdominal Pain     (Consider location/radiation/quality/duration/timing/severity/associated sxs/prior Treatment) HPI Shortness of breath and bloated abdomen. Patient awoke with symptoms at 1am. No chest pain, no fever or cough. No home O2 use.Reports doesn't ned CPAP (was tested for it). No GI symptoms except distention. Reports normal bowel movements, last today. No urinary symptoms. Past Medical History  Diagnosis Date  . GERD (gastroesophageal reflux disease)   . Quadriplegia (HCC)     a. 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  . Hypertension   . Pressure ulcer of foot   . Chronic pain disorder   . Hyperlipidemia   . Occasional tremors   . Chronic diastolic CHF (congestive heart failure) (HCC)   . Diabetes mellitus (HCC)   . History of frequent urinary tract infections   . History of ESBL E. coli infection   . Sepsis (HCC)     a. several prior adm for sepsis.  . Atrial fibrillation (HCC)     a. Observed during 08/2015 admission.  . Pericardial effusion     a. Observed during 08/2015 admission - moderate.  . Pulmonary nodules    Past Surgical History  Procedure Laterality Date  . Back surgery  08/2013  . Abdominal hysterectomy     Family History  Problem Relation Age of Onset  . Adopted: Yes   Social History  Substance Use Topics  . Smoking status: Former Smoker -- 0.25 packs/day    Types: Cigarettes  . Smokeless tobacco: Never Used  . Alcohol Use: No   OB History    No data available     Review of Systems 10 Systems reviewed and are negative for acute change except as noted in the HPI.   Allergies  Decadrol and Sodium phosphate  Home Medications   Prior to Admission medications   Medication Sig Start Date End  Date Taking? Authorizing Provider  acetaminophen (TYLENOL) 325 MG tablet Take 2 tablets (650 mg total) by mouth every 6 (six) hours as needed for mild pain (or Fever >/= 101). 05/03/15  Yes Belkys A Regalado, MD  amLODipine (NORVASC) 5 MG tablet Take 1 tablet (5 mg total) by mouth daily. 08/16/15  Yes Rodolph Bong, MD  apixaban (ELIQUIS) 5 MG TABS tablet Take 1 tablet (5 mg total) by mouth 2 (two) times daily. 08/16/15  Yes Rodolph Bong, MD  baclofen (LIORESAL) 10 MG tablet TAKE 1 TABLET BY MOUTH BID FOR MUSCLE STRAIN 09/07/15  Yes Historical Provider, MD  calcium-vitamin D (OSCAL WITH D) 500-200 MG-UNIT per tablet Take 1 tablet by mouth daily with breakfast.   Yes Historical Provider, MD  Chlorphen-Phenyleph-Ibuprofen (ADVIL ALLERGY & CONGESTION) 4-10-200 MG TABS Take 1 tablet by mouth daily as needed (for allergy).   Yes Historical Provider, MD  gabapentin (NEURONTIN) 300 MG capsule Take 300 mg by mouth 3 (three) times daily.   Yes Historical Provider, MD  glimepiride (AMARYL) 2 MG tablet Take 1 tablet (2 mg total) by mouth daily with breakfast. 08/16/15  Yes Rodolph Bong, MD  LORazepam (ATIVAN) 1 MG tablet Take 0.5 tablets (0.5 mg total) by mouth every 12 (twelve) hours as needed for anxiety. Patient taking differently: Take 1 mg by mouth  2 (two) times daily.  05/03/15  Yes Belkys A Regalado, MD  magnesium oxide (MAG-OX) 400 MG tablet Take 400 mg by mouth 2 (two) times daily.   Yes Historical Provider, MD  Multiple Vitamins-Minerals ([employer] SPECTRAVITE ADULT 50+ PO) Take 1 tablet by mouth daily.   Yes Historical Provider, MD  omeprazole (PRILOSEC) 20 MG capsule Take 2 capsules (40 mg total) by mouth daily. 08/21/15  Yes Renae FickleMackenzie Short, MD  Oxycodone HCl 20 MG TABS Take 0.5 tablets by mouth 2 (two) times daily.  09/16/15  Yes Historical Provider, MD  potassium chloride SA (K-DUR,KLOR-CON) 20 MEQ tablet Take 1 tablet (20 mEq total) by mouth daily. 05/03/15  Yes Belkys A Regalado, MD  predniSONE  (DELTASONE) 5 MG tablet Take 3 tablets (15 mg total) by mouth daily with breakfast. Resume after taper. 08/21/15  Yes Rodolph Bonganiel Thompson V, MD  propranolol (INDERAL) 10 MG tablet Take 3 tablets (30 mg total) by mouth 3 (three) times daily. 08/16/15  Yes Rodolph Bonganiel Thompson V, MD  rOPINIRole (REQUIP) 0.5 MG tablet Take 0.5 mg by mouth 2 (two) times daily. Give 1 tablet 1-3 hours before bedtime (around 1900) and than 1 tablet at bedtime (around 2200)   Yes Historical Provider, MD  rosuvastatin (CRESTOR) 10 MG tablet Take 10 mg by mouth daily. 08/07/15  Yes Historical Provider, MD  saccharomyces boulardii (FLORASTOR) 250 MG capsule Take 1 capsule (250 mg total) by mouth 2 (two) times daily. 04/27/15  Yes Vassie Lollarlos Madera, MD  tamsulosin (FLOMAX) 0.4 MG CAPS capsule Take 0.4 mg by mouth at bedtime.    Yes Historical Provider, MD  torsemide (DEMADEX) 20 MG tablet Take 1 tablet (20 mg total) by mouth daily. 05/03/15  Yes Belkys A Regalado, MD  traZODone (DESYREL) 50 MG tablet Take 50 mg by mouth at bedtime. 04/14/15  Yes Historical Provider, MD  fluconazole (DIFLUCAN) 150 MG tablet Take 1 tablet (150 mg total) by mouth once. Please take on Wednesday. Patient not taking: Reported on 10/10/2015 08/21/15   Renae FickleMackenzie Short, MD  oxyCODONE (OXY IR/ROXICODONE) 5 MG immediate release tablet Take 1 tablet (5 mg total) by mouth every 12 (twelve) hours as needed for severe pain. Patient not taking: Reported on 10/10/2015 08/21/15   Renae FickleMackenzie Short, MD  trifluoperazine (STELAZINE) 5 MG tablet Take 5 mg by mouth 2 (two) times daily as needed (for itching).    Historical Provider, MD   BP 147/88 mmHg  Pulse 100  Temp(Src) 99.1 F (37.3 C) (Oral)  Resp 16  SpO2 98% Physical Exam  Constitutional: She is oriented to person, place, and time.  Central obesity. Moderate increased work of breathing. Non toxic  HENT:  Right Ear: External ear normal.  Left Ear: External ear normal.  Nose: Nose normal.  Mouth/Throat: Oropharynx is clear  and moist.  Moon facies  Eyes: EOM are normal. Pupils are equal, round, and reactive to light.  Cardiovascular: Normal rate, regular rhythm, normal heart sounds and intact distal pulses.   Pulmonary/Chest: Breath sounds normal. She has no wheezes. She has no rales.  Splinting, mild/moderate increased WOB  Abdominal:  Very distended. nontender.  Musculoskeletal:  No edema lower legs. Upper legs, arms and face brawny edema  Neurological: She is alert and oriented to person, place, and time.  Lower extremity paralysis  Skin: Skin is warm and dry.  Psychiatric: She has a normal mood and affect.    ED Course  Procedures (including critical care time) Labs Review Labs Reviewed  COMPREHENSIVE METABOLIC PANEL -  Abnormal; Notable for the following:    Chloride 96 (*)    Glucose, Bld 247 (*)    All other components within normal limits  CBC - Abnormal; Notable for the following:    WBC 16.5 (*)    RBC 5.46 (*)    HCT 46.6 (*)    All other components within normal limits  URINALYSIS, ROUTINE W REFLEX MICROSCOPIC (NOT AT [facility]) - Abnormal; Notable for the following:    APPearance TURBID (*)    Glucose, UA 500 (*)    Hgb urine dipstick MODERATE (*)    Protein, ur 30 (*)    Leukocytes, UA LARGE (*)    All other components within normal limits  URINE MICROSCOPIC-ADD ON - Abnormal; Notable for the following:    Squamous Epithelial / LPF 0-5 (*)    Bacteria, UA MANY (*)    All other components within normal limits  LIPASE, BLOOD  PROTIME-INR  BRAIN NATRIURETIC PEPTIDE  BLOOD GAS, ARTERIAL  I-STAT TROPOININ, ED    Imaging Review Dg Chest 2 View  10/10/2015  CLINICAL DATA:  Dyspnea, onset early this morning and worsening throughout today. EXAM: CHEST  2 VIEW COMPARISON:  08/18/2015 FINDINGS: There is unchanged mild cardiomegaly. The lungs are clear. There is no pleural effusion. The pulmonary vasculature is normal. IMPRESSION: No active cardiopulmonary disease. Electronically Signed   By:  Ellery Plunk M.D.   On: 10/10/2015 22:03   Dg Abd 1 View  10/10/2015  CLINICAL DATA:  Diffuse abdominal pain and distention, onset today. EXAM: ABDOMEN - 1 VIEW COMPARISON:  CT 08/09/2015 FINDINGS: The bowel gas pattern is normal. Moderate stool burden. Enteric contrast within diverticula in the left colon. Oblong densities in the colon are likely related to pills. Portions of the right abdomen excluded from the field of view. No evidence of free air on supine view. Surgical clips in the right abdomen. There is degenerative change in the spine. IMPRESSION: Normal bowel gas pattern.  Moderate stool burden. Electronically Signed   By: Rubye Oaks M.D.   On: 10/10/2015 23:14   I have personally reviewed and evaluated these images and lab results as part of my medical decision-making.   EKG Interpretation   Date/Time:  Wednesday October 10 2015 20:30:06 EDT Ventricular Rate:  92 PR Interval:  157 QRS Duration: 67 QT Interval:  362 QTC Calculation: 448 R Axis:   47 Text Interpretation:  Sinus rhythm Probable left atrial enlargement  Borderline repolarization abnormality agree. no change from old Confirmed  by Donnald Garre, MD, Lebron Conners [phone]) on 10/10/2015 11:37:33 PM     Consult: Reviewed with hospitalist for admission. MDM   Final diagnoses:  Dyspnea  Hypoventilation associated with obesity (HCC)  Lower paraplegia (HCC)   Patient reports dyspnea without associated symptoms. She identifies as abdominal bloating but not pain. At this time I most suspect obesity hypoventilation syndrome. Patient has not had cough or fever and chest x-ray does not show evidence of pneumonia. She does not have associated chest pain, lower extremity swelling or calf pain. She does chronically take Eliquis I have lower suspicion for PE as a source of dyspnea. She does chronically take prednisone and has significant central obesity. Abdominal examination is nonsurgical, but she has significant abdominal girth  compressing the chest cavity. Patient will be admitted for ongoing observation and monitoring.    Arby Barrette, MD 10/11/15 [phone]

## 2015-10-10 NOTE — ED Notes (Signed)
Called respiratory to bedside for arterial blood gas.

## 2015-10-10 NOTE — ED Notes (Signed)
Ruel FavorsJim Poe (son)  [phone]

## 2015-10-10 NOTE — ED Notes (Signed)
IV team at bedside 

## 2015-10-10 NOTE — ED Notes (Signed)
Nurse will attempt to draw blood. 

## 2015-10-10 NOTE — ED Notes (Signed)
Delay in blood draw due to difficult stick

## 2015-10-10 NOTE — ED Notes (Signed)
MD at bedside. 

## 2015-10-11 DIAGNOSIS — I472 Ventricular tachycardia: Secondary | ICD-10-CM | POA: Diagnosis not present

## 2015-10-11 DIAGNOSIS — R140 Abdominal distension (gaseous): Secondary | ICD-10-CM | POA: Diagnosis not present

## 2015-10-11 DIAGNOSIS — Z87891 Personal history of nicotine dependence: Secondary | ICD-10-CM | POA: Diagnosis not present

## 2015-10-11 DIAGNOSIS — I110 Hypertensive heart disease with heart failure: Secondary | ICD-10-CM | POA: Diagnosis present

## 2015-10-11 DIAGNOSIS — Q828 Other specified congenital malformations of skin: Secondary | ICD-10-CM

## 2015-10-11 DIAGNOSIS — I480 Paroxysmal atrial fibrillation: Secondary | ICD-10-CM | POA: Diagnosis present

## 2015-10-11 DIAGNOSIS — G8220 Paraplegia, unspecified: Secondary | ICD-10-CM | POA: Diagnosis present

## 2015-10-11 DIAGNOSIS — R6510 Systemic inflammatory response syndrome (SIRS) of non-infectious origin without acute organ dysfunction: Secondary | ICD-10-CM | POA: Diagnosis present

## 2015-10-11 DIAGNOSIS — E119 Type 2 diabetes mellitus without complications: Secondary | ICD-10-CM

## 2015-10-11 DIAGNOSIS — R251 Tremor, unspecified: Secondary | ICD-10-CM | POA: Diagnosis present

## 2015-10-11 DIAGNOSIS — I5032 Chronic diastolic (congestive) heart failure: Secondary | ICD-10-CM

## 2015-10-11 DIAGNOSIS — F329 Major depressive disorder, single episode, unspecified: Secondary | ICD-10-CM | POA: Diagnosis present

## 2015-10-11 DIAGNOSIS — E662 Morbid (severe) obesity with alveolar hypoventilation: Secondary | ICD-10-CM | POA: Insufficient documentation

## 2015-10-11 DIAGNOSIS — Z87440 Personal history of urinary (tract) infections: Secondary | ICD-10-CM | POA: Diagnosis not present

## 2015-10-11 DIAGNOSIS — R000 Tachycardia, unspecified: Secondary | ICD-10-CM | POA: Diagnosis present

## 2015-10-11 DIAGNOSIS — N390 Urinary tract infection, site not specified: Secondary | ICD-10-CM

## 2015-10-11 DIAGNOSIS — G8250 Quadriplegia, unspecified: Secondary | ICD-10-CM | POA: Diagnosis not present

## 2015-10-11 DIAGNOSIS — Z79891 Long term (current) use of opiate analgesic: Secondary | ICD-10-CM | POA: Diagnosis not present

## 2015-10-11 DIAGNOSIS — K219 Gastro-esophageal reflux disease without esophagitis: Secondary | ICD-10-CM | POA: Diagnosis present

## 2015-10-11 DIAGNOSIS — A419 Sepsis, unspecified organism: Secondary | ICD-10-CM

## 2015-10-11 DIAGNOSIS — E785 Hyperlipidemia, unspecified: Secondary | ICD-10-CM | POA: Diagnosis present

## 2015-10-11 DIAGNOSIS — Z888 Allergy status to other drugs, medicaments and biological substances status: Secondary | ICD-10-CM | POA: Diagnosis not present

## 2015-10-11 DIAGNOSIS — Z6836 Body mass index (BMI) 36.0-36.9, adult: Secondary | ICD-10-CM | POA: Diagnosis not present

## 2015-10-11 DIAGNOSIS — Z7901 Long term (current) use of anticoagulants: Secondary | ICD-10-CM | POA: Diagnosis not present

## 2015-10-11 DIAGNOSIS — J9601 Acute respiratory failure with hypoxia: Secondary | ICD-10-CM | POA: Diagnosis not present

## 2015-10-11 DIAGNOSIS — Z7952 Long term (current) use of systemic steroids: Secondary | ICD-10-CM | POA: Diagnosis not present

## 2015-10-11 DIAGNOSIS — R0602 Shortness of breath: Secondary | ICD-10-CM | POA: Diagnosis present

## 2015-10-11 DIAGNOSIS — I313 Pericardial effusion (noninflammatory): Secondary | ICD-10-CM | POA: Diagnosis present

## 2015-10-11 DIAGNOSIS — G8929 Other chronic pain: Secondary | ICD-10-CM | POA: Diagnosis present

## 2015-10-11 DIAGNOSIS — Z79899 Other long term (current) drug therapy: Secondary | ICD-10-CM | POA: Diagnosis not present

## 2015-10-11 DIAGNOSIS — F419 Anxiety disorder, unspecified: Secondary | ICD-10-CM | POA: Diagnosis present

## 2015-10-11 LAB — BLOOD GAS, ARTERIAL
ACID-BASE DEFICIT: 6.7 mmol/L — AB (ref 0.0–2.0)
ACID-BASE EXCESS: 3.2 mmol/L — AB (ref 0.0–2.0)
Bicarbonate: 33.2 mEq/L — ABNORMAL HIGH (ref 20.0–24.0)
Bicarbonate: 27.2 mEq/L — ABNORMAL HIGH (ref 20.0–24.0)
DRAWN BY: 11249
Drawn by: 295031
FIO2: 0.21
O2 CONTENT: 4 L/min
O2 SAT: 96.3 %
O2 SAT: 92.8 %
PATIENT TEMPERATURE: 98.6
PH ART: 7.382 (ref 7.350–7.450)
Patient temperature: 98.9
TCO2: 29.1 mmol/L (ref 0–100)
TCO2: 23.8 mmol/L (ref 0–100)
pCO2 arterial: 57.4 mmHg (ref 35.0–45.0)
pCO2 arterial: 41.2 mmHg (ref 35.0–45.0)
pH, Arterial: 7.435 (ref 7.350–7.450)
pO2, Arterial: 92 mmHg (ref 80.0–100.0)
pO2, Arterial: 65.1 mmHg — ABNORMAL LOW (ref 80.0–100.0)

## 2015-10-11 LAB — BASIC METABOLIC PANEL
Anion gap: 11 (ref 5–15)
Anion gap: 11 (ref 5–15)
BUN: 12 mg/dL (ref 6–20)
BUN: 13 mg/dL (ref 6–20)
CALCIUM: 9 mg/dL (ref 8.9–10.3)
CHLORIDE: 101 mmol/L (ref 101–111)
CO2: 28 mmol/L (ref 22–32)
CO2: 28 mmol/L (ref 22–32)
Calcium: 8.2 mg/dL — ABNORMAL LOW (ref 8.9–10.3)
Chloride: 99 mmol/L — ABNORMAL LOW (ref 101–111)
Creatinine, Ser: 0.57 mg/dL (ref 0.44–1.00)
Creatinine, Ser: 0.59 mg/dL (ref 0.44–1.00)
GLUCOSE: 334 mg/dL — AB (ref 65–99)
Glucose, Bld: 346 mg/dL — ABNORMAL HIGH (ref 65–99)
POTASSIUM: 4.3 mmol/L (ref 3.5–5.1)
Potassium: 4.5 mmol/L (ref 3.5–5.1)
SODIUM: 140 mmol/L (ref 135–145)
Sodium: 138 mmol/L (ref 135–145)

## 2015-10-11 LAB — GLUCOSE, CAPILLARY
GLUCOSE-CAPILLARY: 337 mg/dL — AB (ref 65–99)
GLUCOSE-CAPILLARY: 329 mg/dL — AB (ref 65–99)
GLUCOSE-CAPILLARY: 272 mg/dL — AB (ref 65–99)
Glucose-Capillary: 263 mg/dL — ABNORMAL HIGH (ref 65–99)
Glucose-Capillary: 310 mg/dL — ABNORMAL HIGH (ref 65–99)

## 2015-10-11 LAB — CBC
HCT: 43.2 % (ref 36.0–46.0)
HEMATOCRIT: 43.8 % (ref 36.0–46.0)
Hemoglobin: 13.8 g/dL (ref 12.0–15.0)
Hemoglobin: 14.1 g/dL (ref 12.0–15.0)
MCH: 26.5 pg (ref 26.0–34.0)
MCH: 27.2 pg (ref 26.0–34.0)
MCHC: 31.5 g/dL (ref 30.0–36.0)
MCHC: 32.6 g/dL (ref 30.0–36.0)
MCV: 84.2 fL (ref 78.0–100.0)
MCV: 83.4 fL (ref 78.0–100.0)
PLATELETS: 237 10*3/uL (ref 150–400)
Platelets: 215 10*3/uL (ref 150–400)
RBC: 5.2 MIL/uL — ABNORMAL HIGH (ref 3.87–5.11)
RBC: 5.18 MIL/uL — ABNORMAL HIGH (ref 3.87–5.11)
RDW: 14.8 % (ref 11.5–15.5)
RDW: 14.5 % (ref 11.5–15.5)
WBC: 15 10*3/uL — AB (ref 4.0–10.5)
WBC: 12.4 10*3/uL — AB (ref 4.0–10.5)

## 2015-10-11 LAB — INFLUENZA PANEL BY PCR (TYPE A & B)
H1N1 flu by pcr: NOT DETECTED
INFLAPCR: NEGATIVE
Influenza B By PCR: NEGATIVE

## 2015-10-11 LAB — LACTIC ACID, PLASMA
Lactic Acid, Venous: 2.2 mmol/L (ref 0.5–2.0)
Lactic Acid, Venous: 1.6 mmol/L (ref 0.5–2.0)

## 2015-10-11 LAB — POCT I-STAT TROPONIN I: Troponin i, poc: 0.01 ng/mL (ref 0.00–0.08)

## 2015-10-11 LAB — MAGNESIUM: MAGNESIUM: 2.1 mg/dL (ref 1.7–2.4)

## 2015-10-11 LAB — PROCALCITONIN: Procalcitonin: <0.1 ng/mL

## 2015-10-11 LAB — CORTISOL: CORTISOL PLASMA: 6.6 ug/dL

## 2015-10-11 LAB — APTT: APTT: 32 s (ref 24–37)

## 2015-10-11 MED ORDER — INSULIN ASPART 100 UNIT/ML ~~LOC~~ SOLN
0.0000 [IU] | Freq: Three times a day (TID) | SUBCUTANEOUS | Status: DC
  Administered 2015-10-11 (×3): 7 [IU] via SUBCUTANEOUS
  Administered 2015-10-12: 9 [IU] via SUBCUTANEOUS
  Administered 2015-10-12: 3 [IU] via SUBCUTANEOUS

## 2015-10-11 MED ORDER — ROSUVASTATIN CALCIUM 10 MG PO TABS
10.0000 mg | ORAL_TABLET | Freq: Every day | ORAL | Status: DC
  Filled 2015-10-11 (×2): qty 1

## 2015-10-11 MED ORDER — MAGNESIUM OXIDE 400 (241.3 MG) MG PO TABS
400.0000 mg | ORAL_TABLET | Freq: Two times a day (BID) | ORAL | Status: DC
  Administered 2015-10-11 – 2015-10-12 (×3): 400 mg via ORAL
  Filled 2015-10-11 (×5): qty 1

## 2015-10-11 MED ORDER — DM-GUAIFENESIN ER 30-600 MG PO TB12: 1.0000 | ORAL_TABLET | Freq: Two times a day (BID) | ORAL | Status: DC | PRN

## 2015-10-11 MED ORDER — SACCHAROMYCES BOULARDII 250 MG PO CAPS
250.0000 mg | ORAL_CAPSULE | Freq: Two times a day (BID) | ORAL | Status: DC
  Administered 2015-10-11 – 2015-10-12 (×3): 250 mg via ORAL
  Filled 2015-10-11 (×5): qty 1

## 2015-10-11 MED ORDER — SODIUM CHLORIDE 0.9 % IV SOLN
500.0000 mg | Freq: Three times a day (TID) | INTRAVENOUS | Status: DC
  Administered 2015-10-11 – 2015-10-12 (×3): 500 mg via INTRAVENOUS
  Filled 2015-10-11 (×5): qty 500

## 2015-10-11 MED ORDER — TORSEMIDE 20 MG PO TABS
20.0000 mg | ORAL_TABLET | Freq: Every day | ORAL | Status: DC
  Filled 2015-10-11: qty 1

## 2015-10-11 MED ORDER — PREDNISONE 5 MG PO TABS
15.0000 mg | ORAL_TABLET | Freq: Every day | ORAL | Status: DC
  Administered 2015-10-11 – 2015-10-12 (×2): 15 mg via ORAL
  Filled 2015-10-11 (×3): qty 1

## 2015-10-11 MED ORDER — SODIUM CHLORIDE 0.9 % IV BOLUS (SEPSIS)
2500.0000 mL | Freq: Once | INTRAVENOUS | Status: AC
  Administered 2015-10-11: 2500 mL via INTRAVENOUS

## 2015-10-11 MED ORDER — ROPINIROLE HCL 0.5 MG PO TABS
0.5000 mg | ORAL_TABLET | ORAL | Status: DC
Start: 2015-10-11 — End: 2015-10-12
  Filled 2015-10-11 (×4): qty 1

## 2015-10-11 MED ORDER — PROPRANOLOL HCL 20 MG PO TABS
30.0000 mg | ORAL_TABLET | Freq: Three times a day (TID) | ORAL | Status: DC
  Administered 2015-10-11 (×2): 30 mg via ORAL
  Filled 2015-10-11 (×3): qty 1

## 2015-10-11 MED ORDER — ONDANSETRON HCL 4 MG/2ML IJ SOLN: 4.0000 mg | Freq: Four times a day (QID) | INTRAMUSCULAR | Status: DC | PRN

## 2015-10-11 MED ORDER — DIPHENHYDRAMINE HCL 25 MG PO CAPS
25.0000 mg | ORAL_CAPSULE | Freq: Every day | ORAL | Status: DC | PRN
  Administered 2015-10-11: 25 mg via ORAL
  Filled 2015-10-11: qty 1

## 2015-10-11 MED ORDER — NALOXONE HCL 0.4 MG/ML IJ SOLN
0.2000 mg | Freq: Once | INTRAMUSCULAR | Status: AC
  Administered 2015-10-11: 0.2 mg via INTRAVENOUS

## 2015-10-11 MED ORDER — PANTOPRAZOLE SODIUM 40 MG PO TBEC
40.0000 mg | DELAYED_RELEASE_TABLET | Freq: Every day | ORAL | Status: DC
  Administered 2015-10-11 – 2015-10-12 (×2): 40 mg via ORAL
  Filled 2015-10-11 (×2): qty 1

## 2015-10-11 MED ORDER — TRAZODONE HCL 50 MG PO TABS
50.0000 mg | ORAL_TABLET | Freq: Every day | ORAL | Status: DC
  Administered 2015-10-11: 50 mg via ORAL
  Filled 2015-10-11 (×2): qty 1

## 2015-10-11 MED ORDER — IPRATROPIUM-ALBUTEROL 0.5-2.5 (3) MG/3ML IN SOLN
3.0000 mL | Freq: Two times a day (BID) | RESPIRATORY_TRACT | Status: DC
Start: 2015-10-11 — End: 2015-10-12
  Administered 2015-10-11 – 2015-10-12 (×2): 3 mL via RESPIRATORY_TRACT
  Filled 2015-10-11 (×2): qty 3

## 2015-10-11 MED ORDER — IPRATROPIUM-ALBUTEROL 0.5-2.5 (3) MG/3ML IN SOLN
3.0000 mL | RESPIRATORY_TRACT | Status: DC
  Administered 2015-10-11: 3 mL via RESPIRATORY_TRACT
  Filled 2015-10-11: qty 3

## 2015-10-11 MED ORDER — SODIUM CHLORIDE 0.9% FLUSH
3.0000 mL | Freq: Two times a day (BID) | INTRAVENOUS | Status: DC
  Administered 2015-10-11 – 2015-10-12 (×2): 3 mL via INTRAVENOUS

## 2015-10-11 MED ORDER — PSEUDOEPHEDRINE HCL 30 MG PO TABS
30.0000 mg | ORAL_TABLET | Freq: Every day | ORAL | Status: DC | PRN
  Filled 2015-10-11: qty 1

## 2015-10-11 MED ORDER — NALOXONE HCL 0.4 MG/ML IJ SOLN
INTRAMUSCULAR | Status: AC
  Administered 2015-10-11: 0.2 mg
  Filled 2015-10-11: qty 1

## 2015-10-11 MED ORDER — VANCOMYCIN HCL IN DEXTROSE 750-5 MG/150ML-% IV SOLN
750.0000 mg | Freq: Two times a day (BID) | INTRAVENOUS | Status: DC
  Administered 2015-10-11: 750 mg via INTRAVENOUS
  Filled 2015-10-11 (×2): qty 150

## 2015-10-11 MED ORDER — APIXABAN 5 MG PO TABS
5.0000 mg | ORAL_TABLET | Freq: Two times a day (BID) | ORAL | Status: DC
  Administered 2015-10-11 – 2015-10-12 (×3): 5 mg via ORAL
  Filled 2015-10-11 (×5): qty 1

## 2015-10-11 MED ORDER — ACETAMINOPHEN 325 MG PO TABS
650.0000 mg | ORAL_TABLET | Freq: Four times a day (QID) | ORAL | Status: DC | PRN
  Administered 2015-10-11: 650 mg via ORAL

## 2015-10-11 MED ORDER — AMLODIPINE BESYLATE 5 MG PO TABS
5.0000 mg | ORAL_TABLET | Freq: Every day | ORAL | Status: DC
  Filled 2015-10-11: qty 1

## 2015-10-11 MED ORDER — ONDANSETRON HCL 4 MG PO TABS: 4.0000 mg | ORAL_TABLET | Freq: Four times a day (QID) | ORAL | Status: DC | PRN

## 2015-10-11 MED ORDER — INSULIN GLARGINE 100 UNIT/ML ~~LOC~~ SOLN
10.0000 [IU] | Freq: Once | SUBCUTANEOUS | Status: AC
  Administered 2015-10-11: 10 [IU] via SUBCUTANEOUS
  Filled 2015-10-11: qty 0.1

## 2015-10-11 MED ORDER — INSULIN ASPART 100 UNIT/ML ~~LOC~~ SOLN
0.0000 [IU] | Freq: Every day | SUBCUTANEOUS | Status: DC
  Administered 2015-10-11 – 2015-10-12 (×2): 3 [IU] via SUBCUTANEOUS

## 2015-10-11 MED ORDER — OXYCODONE HCL 5 MG PO TABS
10.0000 mg | ORAL_TABLET | Freq: Two times a day (BID) | ORAL | Status: DC
  Administered 2015-10-11 – 2015-10-12 (×3): 10 mg via ORAL
  Filled 2015-10-11 (×3): qty 2

## 2015-10-11 MED ORDER — BISACODYL 10 MG RE SUPP
10.0000 mg | Freq: Once | RECTAL | Status: AC
  Administered 2015-10-11: 10 mg via RECTAL
  Filled 2015-10-11: qty 1

## 2015-10-11 MED ORDER — PIPERACILLIN-TAZOBACTAM 3.375 G IVPB
3.3750 g | Freq: Once | INTRAVENOUS | Status: AC
  Administered 2015-10-11: 3.375 g via INTRAVENOUS
  Filled 2015-10-11: qty 50

## 2015-10-11 MED ORDER — METOPROLOL TARTRATE 1 MG/ML IV SOLN
5.0000 mg | Freq: Four times a day (QID) | INTRAVENOUS | Status: DC
  Administered 2015-10-11 – 2015-10-12 (×4): 5 mg via INTRAVENOUS
  Filled 2015-10-11 (×7): qty 5

## 2015-10-11 MED ORDER — IOHEXOL 300 MG/ML  SOLN
50.0000 mL | INTRAMUSCULAR | Status: DC
Start: 2015-10-11 — End: 2015-10-11

## 2015-10-11 MED ORDER — IBUPROFEN 200 MG PO TABS: 200.0000 mg | ORAL_TABLET | Freq: Every day | ORAL | Status: DC | PRN

## 2015-10-11 MED ORDER — GLIMEPIRIDE 2 MG PO TABS
2.0000 mg | ORAL_TABLET | Freq: Every day | ORAL | Status: DC
  Administered 2015-10-12: 2 mg via ORAL
  Filled 2015-10-11 (×2): qty 1

## 2015-10-11 MED ORDER — CALCIUM CARBONATE-VITAMIN D 500-200 MG-UNIT PO TABS
1.0000 | ORAL_TABLET | Freq: Every day | ORAL | Status: DC
  Administered 2015-10-11 – 2015-10-12 (×2): 1 via ORAL
  Filled 2015-10-11 (×3): qty 1

## 2015-10-11 MED ORDER — MAGNESIUM SULFATE 2 GM/50ML IV SOLN
2.0000 g | Freq: Once | INTRAVENOUS | Status: AC
  Administered 2015-10-11: 2 g via INTRAVENOUS
  Filled 2015-10-11: qty 50

## 2015-10-11 MED ORDER — ADULT MULTIVITAMIN W/MINERALS CH
1.0000 | ORAL_TABLET | Freq: Every day | ORAL | Status: DC
  Administered 2015-10-11 – 2015-10-12 (×2): 1 via ORAL
  Filled 2015-10-11 (×2): qty 1

## 2015-10-11 MED ORDER — CHLORPHEN-PHENYLEPH-IBUPROFEN 4-10-200 MG PO TABS: 1.0000 | ORAL_TABLET | Freq: Every day | ORAL | Status: DC | PRN

## 2015-10-11 MED ORDER — TRIFLUOPERAZINE HCL 5 MG PO TABS
5.0000 mg | ORAL_TABLET | Freq: Two times a day (BID) | ORAL | Status: DC | PRN
  Filled 2015-10-11: qty 1

## 2015-10-11 MED ORDER — BACLOFEN 10 MG PO TABS
10.0000 mg | ORAL_TABLET | Freq: Two times a day (BID) | ORAL | Status: DC
  Administered 2015-10-11 – 2015-10-12 (×3): 10 mg via ORAL
  Filled 2015-10-11 (×5): qty 1

## 2015-10-11 MED ORDER — ACETAMINOPHEN 325 MG PO TABS
650.0000 mg | ORAL_TABLET | Freq: Four times a day (QID) | ORAL | Status: DC | PRN
  Administered 2015-10-11: 650 mg via ORAL
  Filled 2015-10-11 (×2): qty 2

## 2015-10-11 MED ORDER — PIPERACILLIN-TAZOBACTAM 3.375 G IVPB
3.3750 g | Freq: Three times a day (TID) | INTRAVENOUS | Status: DC
  Administered 2015-10-11: 3.375 g via INTRAVENOUS
  Filled 2015-10-11 (×2): qty 50

## 2015-10-11 MED ORDER — AMLODIPINE BESYLATE 5 MG PO TABS
5.0000 mg | ORAL_TABLET | Freq: Every day | ORAL | Status: DC
  Administered 2015-10-11 – 2015-10-12 (×2): 5 mg via ORAL
  Filled 2015-10-11 (×2): qty 1

## 2015-10-11 MED ORDER — GABAPENTIN 300 MG PO CAPS
300.0000 mg | ORAL_CAPSULE | Freq: Three times a day (TID) | ORAL | Status: DC
  Administered 2015-10-11 (×2): 300 mg via ORAL
  Filled 2015-10-11 (×3): qty 1

## 2015-10-11 MED ORDER — LORAZEPAM 1 MG PO TABS
1.0000 mg | ORAL_TABLET | Freq: Two times a day (BID) | ORAL | Status: DC
  Administered 2015-10-11 (×2): 1 mg via ORAL
  Filled 2015-10-11 (×2): qty 1

## 2015-10-11 MED ORDER — IOHEXOL 300 MG/ML  SOLN
25.0000 mL | INTRAMUSCULAR | Status: AC
  Administered 2015-10-11 (×2): 25 mL via ORAL

## 2015-10-11 MED ORDER — SODIUM CHLORIDE 0.9 % IV SOLN
INTRAVENOUS | Status: DC
  Administered 2015-10-11 (×2): via INTRAVENOUS

## 2015-10-11 MED ORDER — TAMSULOSIN HCL 0.4 MG PO CAPS
0.4000 mg | ORAL_CAPSULE | Freq: Every day | ORAL | Status: DC
Start: 2015-10-11 — End: 2015-10-12
  Administered 2015-10-11: 0.4 mg via ORAL
  Filled 2015-10-11 (×3): qty 1

## 2015-10-11 NOTE — Significant Event (Signed)
Rapid Response Event Note  Overview:      Initial Focused Assessment:   Interventions:   Event Summary: RRT called to 1510 by bedside RN. Pt "not herself", had run of V TACH and not staying awake. RRT arrived. Pt supine in bed. A/O to self and place. Able to converse in sentences. Quickly falls in deep sleep. VS are BP 108/55, HR 86, RR 22, Sats 94% on RA.  Dr Butler Denmarkizwan aware and order for Narcan 0.2 mg given. Repeated 0.2 mg more per RRT protocol. Able to arouse pt with sternal rub to "yeah" but falls quickly back to sleep. Dr Butler Denmarkizwan aware.  VSS during RRT consult at BP 109-150/50-70, HR 80's NSR, RR 18-38, Sats 94% on RA.  RRT feels this is a sedation issue? Pt remains stable on monitor. Call if further needed. Thank you   at      at          [place], [place]

## 2015-10-11 NOTE — Progress Notes (Signed)
PT Cancellation Note  Patient Details Name: [name] MeekerLinda [name] MRN: [id number] DOB: 11/30/1945   Cancelled Treatment:    Reason Eval/Treat Not Completed: PT screened, no needs identified, will sign off. Pt is total assist and requires hoyer lift for OOB to chair at baseline.  Recommend OOB to chair with lift equipment with nursing if/when tolerated. Will sign off. Thanks.    Rebeca AlertJannie Verner Kopischke, MPT Pager: [phone]

## 2015-10-11 NOTE — Progress Notes (Signed)
RT obtained an ABG. Results are not in the system due to crossing over difficulty, ABG results are: pH7.38, pCO2 57.4, pO2 92, Bicarb 33.2 on 4 liters oxygen. Results given to [facility]cy Pfriffer,MD.

## 2015-10-11 NOTE — Care Management Note (Signed)
Case Management Note  Patient Details  Name: [name] MeekerLinda [name] MRN: [id number] Date of Birth: 18-Dec-1945  Subjective/Objective:  70 y.o. F admitted 10/10/2015 with ARF with Hypoxia. Pt reported that she is a Careers adviser"workmans Comp" recipient but was unable to give me a phone number or name. CM located Case Manager [facility] Veronda Rubright [phone]. CM made her aware of pts admission and diagnosis.   She will have RNCM call back if more information needed.                Action/Plan:CM will continue to follow for discharge needs. Pt has DME and is total assist at home pta where she lives with family.    Expected Discharge Date:                  Expected Discharge Plan:  Home/Self Care  In-House Referral:     Discharge planning Services  CM Consult  Post Acute Care Choice:    Choice offered to:     DME Arranged:    DME Agency:     HH Arranged:    HH Agency:     Status of Service:  In process, will continue to follow  Medicare Important Message Given:    Date Medicare IM Given:    Medicare IM give by:    Date Additional Medicare IM Given:    Additional Medicare Important Message give by:     If discussed at Long Length of Stay Meetings, dates discussed:    Additional Comments:  Yvone NeuCrutchfield, Lisa Milian M, RN 10/11/2015, 2:20 PM

## 2015-10-11 NOTE — Progress Notes (Signed)
Critical Lactic Acid 2.2; MD paged; new orders placed.

## 2015-10-11 NOTE — Progress Notes (Addendum)
[facility] Progress Note   [name] MeekerLinda Belen  [id number]  DOB: 01-Jan-1946  DOA: 10/10/2015 PCP: [name], Samuel, MD  Brief narrative: [name] [name] is a 70 y.o. female with PMH of hypertension, hyperlipidemia, diabetes mellitus, GERD, depression, anxiety, quadriplegia, diastolic congestive heart failure, recurrent UTIs, atrial fibrillation on Eliquis, who presents with shortness of breath. Last admission 2/11- 2/14 for resp failure suspected to be due to excess narcotics.    Subjective: Very sleepy but arouses an is oriented. No dyspnea or abdominal distension currently. No cough. Has lower back pain.   Assessment/Plan: Principal Problem:   Acute respiratory failure with hypoxia - appears to have resolved- pulse ox checked in front of me after I removed O2 is 94% on room air- will monitor with continuous pulse ox  Active Problems:   Complicated UTI / leukocytosis, tachycardia, tachypnea- sepsis - started on Vanc and Zosyn- grew out e coli ESBL in 10/16- switch to Imipenem & follow - on Flomax    Abdominal distension - no significant distension noted- does not seem to be bothered by it at this time- has a moderate amount of stool in abdomen on Xray- non-tender on exam- dulcolax suppository ordered - has had 3 BMs from this    Quadriplegia  - lives at home with family    PAF (paroxysmal atrial fibrillation) - cont Eliquis and Propranolol    Chronic diastolic CHF (congestive heart failure) - Torsemide held- cont Propranolol    Type 2 diabetes, HbA1c goal < 7%  - A1c 9/3 on 2/17- sugars quite high today after Solumedrol - resume Amaryl in AM- cont ISS    Porokeratosis - cont steroids  ADDENDUM:  - very sleepy this afternoon- has not resolved with Narcan-ABG unrevealing-  stop Ativan, Depakote, Neurontin for tonight- re-address doses with her tomorrow - v- tach episodes noted- asymptomatic- stop Propranolol - start IV lopressor- recent ECHO noted- obtain Bmet and  Mg+  Antibiotics: Anti-infectives    Start     Dose/Rate Route Frequency Ordered Stop   10/11/15 1200  piperacillin-tazobactam (ZOSYN) IVPB 3.375 g     3.375 g 12.5 mL/hr over 240 Minutes Intravenous Every 8 hours 10/11/15 0321     10/11/15 0400  vancomycin (VANCOCIN) IVPB 750 mg/150 ml premix     750 mg 150 mL/hr over 60 Minutes Intravenous Every 12 hours 10/11/15 0316     10/11/15 0315  piperacillin-tazobactam (ZOSYN) IVPB 3.375 g     3.375 g 100 mL/hr over 30 Minutes Intravenous  Once 10/11/15 0315 10/11/15 0445     Code Status:     Code Status Orders        Start     Ordered   10/11/15 0155  Full code   Continuous     10/11/15 0155    Code Status History    Date Active Date Inactive Code Status Order ID Comments User Context   08/18/2015  3:04 PM 08/21/2015  5:04 PM Full Code [id number]  Roslynn AmbleJennings E Nestor, MD ED   08/09/2015  5:02 PM 08/16/2015  6:04 PM Full Code [id number]  Richarda OverlieNayana Abrol, MD ED   04/30/2015  8:17 PM 05/03/2015  5:10 PM Full Code [id number]  Vassie Lollarlos Madera, MD Inpatient   04/23/2015 10:02 PM 04/27/2015  5:49 PM Full Code [id number]  Ozella Rocksavid J Merrell, MD ED   04/17/2015  5:26 PM 04/20/2015  4:57 PM Full Code [id number]  Esperanza SheetsUlugbek N Buriev, MD Inpatient   01/21/2015  6:34 PM 01/29/2015  5:37 PM Full Code [id number]  Coralyn Helling, MD ED   01/17/2015  8:59 AM 01/19/2015  5:35 PM Full Code [id number]  Dorothea Ogle, MD Inpatient   08/26/2014 12:08 AM 08/29/2014  4:59 PM Full Code [id number]  Ron Parker, MD ED   04/16/2014  5:22 AM 04/18/2014  5:04 PM Full Code [id number]  Dorothea Ogle, MD ED   03/26/2014  5:24 PM 03/31/2014  4:23 PM Full Code [id number]  Dorothea Ogle, MD Inpatient   03/14/2014  2:06 AM 03/17/2014  5:51 PM Full Code [id number]  Therisa Doyne, MD Inpatient   12/20/2013  1:46 PM 12/22/2013  7:04 PM Full Code [id number]  Penny Pia, MD Inpatient     Family Communication:  Disposition Plan: undetermined DVT prophylaxis: Eliquis Consultants:  Procedures:      Objective: Filed Weights   10/11/15 0129  Weight: 85.9 kg (189 lb 6 oz)   No intake or output data in the 24 hours ending 10/11/15 1443   Vitals Filed Vitals:   10/11/15 0510 10/11/15 0800 10/11/15 1017 10/11/15 1400  BP: 121/71 136/75  138/55  Pulse: 96 105  78  Temp: 97.8 F (36.6 C) 97 F (36.1 C)  97.6 F (36.4 C)  TempSrc: Oral Axillary  Oral  Resp: Weight:      SpO2: 97% 99% 97% 95%    Exam:  General:  Pt is sleepy but awakens, not in acute distress  HEENT: No icterus, No thrush, oral mucosa moist  Cardiovascular: regular rate and rhythm, S1/S2 No murmur  Respiratory: clear to auscultation bilaterally   Abdomen: Soft, +Bowel sounds, non tender, non distended, no guarding  MSK: No cyanosis or clubbing- no pedal edema   Data Reviewed: Basic Metabolic Panel:  Recent Labs Lab 10/10/15 2214 10/11/15 0514  NA 141 140  K 3.8 4.3  CL 96* 101  CO2 32 28  GLUCOSE 247* 346*  BUN 12 12  CREATININE 0.73 0.57  CALCIUM 9.2 8.2*   Liver Function Tests:  Recent Labs Lab 10/10/15 2214  AST 28  ALT 28  ALKPHOS 63  BILITOT 0.6  PROT 6.5  ALBUMIN 3.7    Recent Labs Lab 10/10/15 2214  LIPASE 42   No results for input(s): AMMONIA in the last 168 hours. CBC:  Recent Labs Lab 10/10/15 2214 10/11/15 0514  WBC 16.5* 15.0*  HGB 14.9 13.8  HCT 46.6* 43.8  MCV 85.3 84.2  PLT 241 215   Cardiac Enzymes: No results for input(s): CKTOTAL, CKMB, CKMBINDEX, TROPONINI in the last 168 hours. BNP (last 3 results)  Recent Labs  08/09/15 1050 08/18/15 1035 10/10/15 2214  BNP 130.8* 166.4* 72.9    ProBNP (last 3 results) No results for input(s): PROBNP in the last 8760 hours.  CBG:  Recent Labs Lab 10/11/15 0206 10/11/15 0749 10/11/15 1137  GLUCAP 263* 337* 310*    No results found for this or any previous visit (from the past 240 hour(s)).   Studies: Dg Chest 2 View  10/10/2015  CLINICAL DATA:  Dyspnea, onset early this  morning and worsening throughout today. EXAM: CHEST  2 VIEW COMPARISON:  08/18/2015 FINDINGS: There is unchanged mild cardiomegaly. The lungs are clear. There is no pleural effusion. The pulmonary vasculature is normal. IMPRESSION: No active cardiopulmonary disease. Electronically Signed   By: Ellery Plunk M.D.   On: 10/10/2015 22:03   Dg Abd 1 View  10/10/2015  CLINICAL DATA:  Diffuse abdominal pain and distention, onset today. EXAM: ABDOMEN - 1  VIEW COMPARISON:  CT 08/09/2015 FINDINGS: The bowel gas pattern is normal. Moderate stool burden. Enteric contrast within diverticula in the left colon. Oblong densities in the colon are likely related to pills. Portions of the right abdomen excluded from the field of view. No evidence of free air on supine view. Surgical clips in the right abdomen. There is degenerative change in the spine. IMPRESSION: Normal bowel gas pattern.  Moderate stool burden. Electronically Signed   By: Rubye Oaks M.D.   On: 10/10/2015 23:14    Scheduled Meds:  Scheduled Meds: . amLODipine  5 mg Oral Daily  . apixaban  5 mg Oral BID  . baclofen  10 mg Oral BID  . calcium-vitamin D  1 tablet Oral Q breakfast  . gabapentin  300 mg Oral TID  . insulin aspart  0-5 Units Subcutaneous QHS  . insulin aspart  0-9 Units Subcutaneous TID WC  . ipratropium-albuterol  3 mL Nebulization BID  . LORazepam  1 mg Oral BID  . magnesium oxide  400 mg Oral BID  . multivitamin with minerals  1 tablet Oral Daily  . oxyCODONE  10 mg Oral BID  . pantoprazole  40 mg Oral Daily  . piperacillin-tazobactam (ZOSYN)  IV  3.375 g Intravenous Q8H  . predniSONE  15 mg Oral Q breakfast  . propranolol  30 mg Oral TID  . rOPINIRole  0.5 mg Oral 2 times per day  . rosuvastatin  10 mg Oral Daily  . saccharomyces boulardii  250 mg Oral BID  . sodium chloride flush  3 mL Intravenous Q12H  . tamsulosin  0.4 mg Oral QHS  . traZODone  50 mg Oral QHS  . vancomycin  750 mg Intravenous Q12H    Continuous Infusions:   Time spent on care of this patient: 35 min   Kenedee Molesky, MD 10/11/2015, 2:43 PM    [facility] Office  [phone] Pager - Text Page per [email or web address] If 7PM-7AM, please contact night-coverage [email or web address]

## 2015-10-11 NOTE — Progress Notes (Signed)
Holding PO meds this evening. Patient arouses to voice/touch but quickly falls back to sleep. O2 sats and vitals WNL. Patient has 7 seconds tachycardia. On call MD made aware of all of this info.

## 2015-10-11 NOTE — Progress Notes (Signed)
Pharmacy Antibiotic Note  [name] MeekerLinda [name] is a 70 y.o. female admitted on 10/10/2015 with sepsis.  Pharmacy has been consulted for zosyn/vancomycin dosing.  Plan: Zosyn 3.375 Gm IV q8h EI Vancomycin 750mg  IV q12h (VT=15-20 mg/L)  Weight: 189 lb 6 oz (85.9 kg)  Temp (24hrs), Avg:99.1 F (37.3 C), Min:99.1 F (37.3 C), Max:99.1 F (37.3 C)   Recent Labs Lab 10/10/15 2214 10/11/15 0207  WBC 16.5*  --   CREATININE 0.73  --   LATICACIDVEN  --  2.2*    Estimated Creatinine Clearance: 65.6 mL/min (by C-G formula based on Cr of 0.73).    Allergies  Allergen Reactions  . Decadrol [Dexamethasone] Swelling    Swelling of face and stomach   . Sodium Phosphate Swelling    Swelling of face and stomach     Antimicrobials this admission: 4/6 zosyn >>  4/6 vancomycin >>   Dose adjustments this admission:   Microbiology results:  BCx:   UCx:    Sputum:    MRSA PCR:   Thank you for allowing pharmacy to be a part of this patient's care.  [name] [name], [name] [name] 10/11/2015 3:18 AM

## 2015-10-11 NOTE — Progress Notes (Signed)
Pharmacy Antibiotic Note  [name] MeekerLinda Holwerda is a 70 y.o. female admitted on 10/10/2015 with sepsis, suspected UTI.  Pharmacy was consulted for zosyn/vancomycin dosing. Due to history of ESBL E.coli, antibiotics are switched to Primaxin per pharmacy.   Plan: Primaxin 500mg  IV q8h. Follow up renal fxn, culture results, and clinical course.  Weight: 189 lb 6 oz (85.9 kg)  Temp (24hrs), Avg:98.1 F (36.7 C), Min:97 F (36.1 C), Max:99.1 F (37.3 C)   Recent Labs Lab 10/10/15 2214 10/11/15 0207 10/11/15 0514  WBC 16.5*  --  15.0*  CREATININE 0.73  --  0.57  LATICACIDVEN  --  2.2* 1.6    Estimated Creatinine Clearance: 65.6 mL/min (by C-G formula based on Cr of 0.57).    Allergies  Allergen Reactions  . Decadrol [Dexamethasone] Swelling    Swelling of face and stomach   . Sodium Phosphate Swelling    Swelling of face and stomach     Antimicrobials this admission: 4/6 zosyn >> 4/6 4/6 vancomycin >> 4/6 4/6 Primaxin >>   Dose adjustments this admission:   Microbiology results:  BCx:   UCx:    Sputum:    MRSA PCR:   Thank you for allowing pharmacy to be a part of this patient's care.  Charolotte Ekeom Ramyah Pankowski, PharmD, pager [phone]. 10/11/2015,3:16 PM.

## 2015-10-11 NOTE — Progress Notes (Addendum)
Patient has 5 beat run of vtach. She stated that she was having "black outs". She has no c/o pain or SOB. Paged MD and RR RN called to assess patient. New orders placed. Continue to monitor.

## 2015-10-11 NOTE — Progress Notes (Signed)
OT Cancellation Note  Patient Details Name: [name] MeekerLinda Millea MRN: [id number] DOB: [birth year]   Cancelled Treatment:    Reason Eval/Treat Not Completed: OT screened, no needs identified, will sign off.  Noted pt needs total A and hoyer lift at baseline. Will sign off.  Timia Casselman 10/11/2015, 10:20 AM  Marica OtterMaryellen Fabrice Dyal, OTR/L [phone] 10/11/2015

## 2015-10-11 NOTE — Care Management Obs Status (Signed)
MEDICARE OBSERVATION STATUS NOTIFICATION   Patient Details  Name: Ninfa MeekerLinda Matassa MRN: [id number] Date of Birth: February 26, 1946   Medicare Observation Status Notification Given:  Yes    CrutchfieldDerrill Memo, Lenisha Lacap M, RN 10/11/2015, 9:51 AM

## 2015-10-11 NOTE — Progress Notes (Signed)
Patient having trouble waking up. Paged MD. New orders placed.

## 2015-10-12 DIAGNOSIS — I472 Ventricular tachycardia, unspecified: Secondary | ICD-10-CM

## 2015-10-12 DIAGNOSIS — G8220 Paraplegia, unspecified: Secondary | ICD-10-CM

## 2015-10-12 LAB — CBC
HEMATOCRIT: 41.5 % (ref 36.0–46.0)
Hemoglobin: 13.5 g/dL (ref 12.0–15.0)
MCH: 27.6 pg (ref 26.0–34.0)
MCHC: 32.5 g/dL (ref 30.0–36.0)
MCV: 84.9 fL (ref 78.0–100.0)
Platelets: 280 10*3/uL (ref 150–400)
RBC: 4.89 MIL/uL (ref 3.87–5.11)
RDW: 14.9 % (ref 11.5–15.5)
WBC: 13.6 10*3/uL — AB (ref 4.0–10.5)

## 2015-10-12 LAB — BASIC METABOLIC PANEL
Anion gap: 9 (ref 5–15)
BUN: 13 mg/dL (ref 6–20)
CHLORIDE: 100 mmol/L — AB (ref 101–111)
CO2: 30 mmol/L (ref 22–32)
Calcium: 9 mg/dL (ref 8.9–10.3)
Creatinine, Ser: 0.68 mg/dL (ref 0.44–1.00)
GFR calc Af Amer: >60 mL/min (ref >60)
GFR calc non Af Amer: >60 mL/min (ref >60)
Glucose, Bld: 266 mg/dL — ABNORMAL HIGH (ref 65–99)
POTASSIUM: 5.1 mmol/L (ref 3.5–5.1)
SODIUM: 139 mmol/L (ref 135–145)

## 2015-10-12 LAB — GLUCOSE, CAPILLARY
GLUCOSE-CAPILLARY: 224 mg/dL — AB (ref 65–99)
GLUCOSE-CAPILLARY: 386 mg/dL — AB (ref 65–99)
Glucose-Capillary: 295 mg/dL — ABNORMAL HIGH (ref 65–99)

## 2015-10-12 LAB — URINE CULTURE: Culture: NO GROWTH

## 2015-10-12 MED ORDER — LORAZEPAM 1 MG PO TABS: 1.0000 mg | ORAL_TABLET | Freq: Two times a day (BID) | ORAL | Status: DC | PRN

## 2015-10-12 MED ORDER — SODIUM CHLORIDE 0.9 % IV SOLN
500.0000 mg | Freq: Four times a day (QID) | INTRAVENOUS | Status: DC
  Administered 2015-10-12: 500 mg via INTRAVENOUS
  Filled 2015-10-12 (×2): qty 500

## 2015-10-12 MED ORDER — PROPRANOLOL HCL 20 MG PO TABS
30.0000 mg | ORAL_TABLET | Freq: Three times a day (TID) | ORAL | Status: DC
  Filled 2015-10-12 (×2): qty 1

## 2015-10-12 MED ORDER — LORAZEPAM 1 MG PO TABS
1.0000 mg | ORAL_TABLET | Freq: Once | ORAL | Status: AC
  Administered 2015-10-12: 1 mg via ORAL
  Filled 2015-10-12: qty 1

## 2015-10-12 NOTE — Progress Notes (Signed)
MD notified of about 1 minute run of Vtach/torsades.  Pt converted back to SR rate of 81. Patient awake and alert during epidsode- RN spoke with patient. Patient does have notable tremor. Pt requests ativan. MD notified.

## 2015-10-12 NOTE — Progress Notes (Signed)
Pt discharged from the unit via [facility]. Discharge instructions were reviewed with the pt. Pt belongings sent home with the pt. Report given to [facility]. No questions or concerns at this time.  Marketia Stallsmith W Trajon Rosete, RN

## 2015-10-12 NOTE — Consult Note (Signed)
CARDIOLOGY CONSULT NOTE       Patient ID: [name] [name] MRN: [id number] DOB/AGE: August 24, 1945 70 y.o.  Admit date: 10/10/2015 Referring Physician:  Butler Denmarkizwan Primary Physician: [name], Samuel, MD Primary Cardiologist:  New Reason for Consultation:  VT  Principal Problem:   Acute respiratory failure with hypoxia [facility](HCC) Active Problems:   Porokeratosis   UTI (lower urinary tract infection)   Sepsis (HCC)   Quadriplegia (HCC)   PAF (paroxysmal atrial fibrillation) (HCC)   Chronic diastolic CHF (congestive heart failure) (HCC)   Type 2 diabetes, HbA1c goal < 7% (HCC)   Abdominal distension   Lower paraplegia (HCC)   HPI:  70 y.o. unfortunate with quadriplegia 17 years after traumatic fall does had limited motion of UE;s.  Admitted with abdominal distension and dyspnea.  She was checked for OSA Last admission and negative Does not wear CPAP.  Does appear to have obesity hypoventilation though. She has bad UE tremors. Reviewed her monitor strips and she is in NSR With no torsades or VT This is all related to artifact from her tremors. She has no palpitations. QT is normal   Echo 08/13/15 with chronic pericardial effuson normal EF 65-70% and  RVH.  She has no chest pain. Dyspnea improved Worried about how she is going to get home. She indicates she is not getting her medicine for tremors in hospital Son lays her Pills out for her. Needs [place] lift at home to get in wheel chair  ROS All other systems reviewed and negative except as noted above  Past Medical History  Diagnosis Date  . GERD (gastroesophageal reflux disease)   . Quadriplegia (HCC)     a. due to spinal cord injury - fell at work onto cement ~2000 while working in a [employer].  . Hypertension   . Pressure ulcer of foot   . Chronic pain disorder   . Hyperlipidemia   . Occasional tremors   . Chronic diastolic CHF (congestive heart failure) (HCC)   . Diabetes mellitus (HCC)   . History of frequent urinary tract  infections   . History of ESBL E. coli infection   . Sepsis (HCC)     a. several prior adm for sepsis.  . Atrial fibrillation (HCC)     a. Observed during 08/2015 admission.  . Pericardial effusion     a. Observed during 08/2015 admission - moderate.  . Pulmonary nodules     Family History  Problem Relation Age of Onset  . Adopted: Yes    Social History   Social History  . Marital Status: Unknown    Spouse Name: N/A  . Number of Children: N/A  . Years of Education: N/A   Occupational History  . Not on file.   Social History Main Topics  . Smoking status: Former Smoker -- 0.25 packs/day    Types: Cigarettes  . Smokeless tobacco: Never Used  . Alcohol Use: No  . Drug Use: No  . Sexual Activity: No   Other Topics Concern  . Not on file   Social History Narrative   ** Merged History Encounter **        Past Surgical History  Procedure Laterality Date  . Back surgery  08/2013  . Abdominal hysterectomy       . amLODipine  5 mg Oral Daily  . apixaban  5 mg Oral BID  . baclofen  10 mg Oral BID  . calcium-vitamin D  1 tablet Oral Q breakfast  . glimepiride  2  mg Oral Q breakfast  . imipenem-cilastatin  500 mg Intravenous 4 times per day  . insulin aspart  0-5 Units Subcutaneous QHS  . insulin aspart  0-9 Units Subcutaneous TID WC  . ipratropium-albuterol  3 mL Nebulization BID  . magnesium oxide  400 mg Oral BID  . metoprolol  5 mg Intravenous 4 times per day  . multivitamin with minerals  1 tablet Oral Daily  . oxyCODONE  10 mg Oral BID  . pantoprazole  40 mg Oral Daily  . predniSONE  15 mg Oral Q breakfast  . rOPINIRole  0.5 mg Oral 2 times per day  . rosuvastatin  10 mg Oral Daily  . saccharomyces boulardii  250 mg Oral BID  . sodium chloride flush  3 mL Intravenous Q12H  . tamsulosin  0.4 mg Oral QHS  . traZODone  50 mg Oral QHS      Physical Exam: Blood pressure 105/67, pulse 79, temperature 98.4 F (36.9 C), temperature source Oral, resp. rate 18,  weight 90.7 kg (199 lb 15.3 oz), SpO2 98 %.   Affect appropriate Obese white female  HEENT: normal Neck supple with no adenopathy JVP normal no bruits no thyromegaly Lungs clear with no wheezing and good diaphragmatic motion Heart:  S1/S2 no murmur, no rub, gallop or click PMI normal Abdomen: benighn, BS positve, no tenderness, no AAA no bruit.  No HSM or HJR Distal pulses intact with no bruits Quadriplegia with limited motion UE;s and marked tremor    Labs:   Lab Results  Component Value Date   WBC 13.6* 10/12/2015   HGB 13.5 10/12/2015   HCT 41.5 10/12/2015   MCV 84.9 10/12/2015   PLT 280 10/12/2015    Recent Labs Lab 10/10/15 2214  10/12/15 0518  NA 141  < > 139  K 3.8  < > 5.1  CL 96*  < > 100*  CO2 32  < > 30  BUN 12  < > 13  CREATININE 0.73  < > 0.68  CALCIUM 9.2  < > 9.0  PROT 6.5  --   --   BILITOT 0.6  --   --   ALKPHOS 63  --   --   ALT 28  --   --   AST 28  --   --   GLUCOSE 247*  < > 266*  < > = values in this interval not displayed. Lab Results  Component Value Date   CKTOTAL 36* 08/12/2015   CKMB 3.0 08/12/2015   TROPONINI <0.03 08/12/2015    Lab Results  Component Value Date   CHOL  03/14/2007    186        ATP III CLASSIFICATION:  <200     mg/dL   Desirable  161-096  mg/dL   Borderline High  >=045    mg/dL   High   Lab Results  Component Value Date   HDL 32* 03/14/2007   Lab Results  Component Value Date   LDLCALC * 03/14/2007    124        Total Cholesterol/HDL:CHD Risk Coronary Heart Disease Risk Table                     Men   Women  1/2 Average Risk   3.4   3.3   Lab Results  Component Value Date   TRIG 152* 03/14/2007   Lab Results  Component Value Date   CHOLHDL 5.8 03/14/2007   No results  found for: LDLDIRECT    Radiology: Dg Chest 2 View  10/10/2015  CLINICAL DATA:  Dyspnea, onset early this morning and worsening throughout today. EXAM: CHEST  2 VIEW COMPARISON:  08/18/2015 FINDINGS: There is unchanged mild  cardiomegaly. The lungs are clear. There is no pleural effusion. The pulmonary vasculature is normal. IMPRESSION: No active cardiopulmonary disease. Electronically Signed   By: Ellery Plunk M.D.   On: 10/10/2015 22:03   Dg Abd 1 View  10/10/2015  CLINICAL DATA:  Diffuse abdominal pain and distention, onset today. EXAM: ABDOMEN - 1 VIEW COMPARISON:  CT 08/09/2015 FINDINGS: The bowel gas pattern is normal. Moderate stool burden. Enteric contrast within diverticula in the left colon. Oblong densities in the colon are likely related to pills. Portions of the right abdomen excluded from the field of view. No evidence of free air on supine view. Surgical clips in the right abdomen. There is degenerative change in the spine. IMPRESSION: Normal bowel gas pattern.  Moderate stool burden. Electronically Signed   By: Rubye Oaks M.D.   On: 10/10/2015 23:14    EKG: SR RAE normal othewise including QT   ASSESSMENT AND PLAN:  Rhythm:  Artifact from UE tremors patient has no VT.  EF is normal and QT is normal no Rx indicated Pericardial Effusion: chronic can consider f/u echo but not necessary now Dyspnea:  She likely has some mechanical issues with breathing and dependant atelectasis from being bed bound to explain RVH Still wonder if positive pressure breathing would be helpful to her Abdomen:  ? Constipation less distended no pain follow Tremor:  Consider adding mysoline or inderal for tremor Ask son to bring home meds in   Will sign off   Signed: Charlton Haws 10/12/2015, 9:54 AM

## 2015-10-12 NOTE — Progress Notes (Signed)
This CM was contacted by MD for assistance with transportation home.  [facility] pt [facility]  Medical Necessity form filled out and printed as well as face sheet.  Both given to the RN with [facility] number.  RN to arrange with pt son when to call [facility] as he needs to be home with her house key.  This CM also called pt's worker's comp rep to alert her that pt was going home today.  Pt son to call pt's personal care aide to come to house today as well.  No other CM needs communicated. Sandford Crazeora Ketrina Boateng RN,BSN,NCM [phone]

## 2015-10-12 NOTE — Progress Notes (Addendum)
Pharmacy Antibiotic Note  [name] MeekerLinda [name] is a 70 y.o. female admitted on 10/10/2015 with sepsis, suspected UTI.  Pharmacy was initially consulted for zosyn/vancomycin dosing. Due to history of ESBL E.coli, antibiotics are switched to Primaxin per pharmacy.   Plan:  Increase Primaxin 500mg  IV q6h.  Follow up renal fxn, culture results, and clinical course.  De-escalate as appropriate  Weight: 199 lb 15.3 oz (90.7 kg)  Temp (24hrs), Avg:98.1 F (36.7 C), Min:97.6 F (36.4 C), Max:98.4 F (36.9 C)   Recent Labs Lab 10/10/15 2214 10/11/15 0207 10/11/15 0514 10/11/15 1816 10/12/15 0518  WBC 16.5*  --  15.0* 12.4* 13.6*  CREATININE 0.73  --  0.57 0.59 0.68  LATICACIDVEN  --  2.2* 1.6  --   --     Estimated Creatinine Clearance: 67.5 mL/min (by C-G formula based on Cr of 0.68).  CrCl = 73 ml/min/1.3873m2 (normalized)    Allergies  Allergen Reactions  . Decadrol [Dexamethasone] Swelling    Swelling of face and stomach   . Sodium Phosphate Swelling    Swelling of face and stomach     Antimicrobials this admission: 4/6 >> Zosyn >> 4/6 4/6 >> Vanc >> 4/6 4/6 >> Primaxin >>   Dose adjustments this admission: 4/7: Increase Primaxin to 500mg  q6h for CrCl >70 normalized  Microbiology results: 4/6 BCx: sent 4/5 UCx: sent 4/6 resp virus panel: sent  4/6 influenza panel: neg  Thank you for allowing pharmacy to be a part of this patient's care.  Loralee PacasErin Demir Titsworth, PharmD, BCPS Pager: [phone] 10/12/2015,9:22 AM.

## 2015-10-12 NOTE — Discharge Summary (Signed)
Physician Discharge Summary  [name] MeekerLinda Caldeira RUE:[id number]:[id number] DOB: 07/06/1946 DOA: 10/10/2015  PCP: [name], Samuel, MD  Admit date: 10/10/2015 Discharge date: 10/12/2015  Time spent: 50 minutes  Recommendations for Outpatient Follow-up:  1. Continue to reduce narcotics and benzodiazepines as you are.    Discharge Condition: stable    Discharge Diagnoses:  Principal Problem:   Acute respiratory failure with hypoxia (HCC) Active Problems:   Abdominal distension   Porokeratosis   PAF (paroxysmal atrial fibrillation) (HCC)   Chronic diastolic CHF (congestive heart failure) (HCC)   Type 2 diabetes, HbA1c goal < 7% (HCC)   Lower paraplegia (HCC)   History of present illness:  [name] [name] is a 70 y.o. female with PMH of hypertension, hyperlipidemia, diabetes mellitus, GERD, depression, anxiety, paraplegia due to fall at work in 2000, diastolic congestive heart failure, recurrent UTIs, atrial fibrillation on Eliquis, who presents with shortness of breath and abdominal distension. Hypoxic with pulse ox in 80s on room air in ER.  Last admission 2/11- 2/14 for resp failure suspected to be due to excess narcotics.   Hospital Course:  Principal Problem:  Acute respiratory failure with hypoxia - by the following morning O 2 was 94% on room air, Abdominal distension had resolved on its own. Pulse ox remains normal today. Gave Dulcolax suppository - had 3-4 BMs. Abdomen soft. She has no complaints of dyspnea or abdominal distension.   Active Problems:  Leukocytosis, tachycardia, tachypnea- SIRS -thought initially to be due to a UTI however, culture is still negative- she has had numerous positive UAs in the past with negative cultures- I will stop antibiotics today -continue on Flomax  Somnolence on 4/6 -chronically on narcotics and Benzodiazepines prescribed by her PCP - became very somnolent yesterday after giving home doses of medications- meds held in the evening and now awake and alert.  She states that she does not have this problem at home. Her CNA administers medications that he son puts in a pill box for her  Paraplegia - lives at home, has hoyer lift   PAF (paroxysmal atrial fibrillation) - cont Eliquis and Propranolol   Chronic diastolic CHF (congestive heart failure) - resume Toresemide- cont Propranolol which she takes for her tremors as well   Type 2 diabetes, HbA1c goal < 7%  - A1c 9/3 on 2/17  - resume Amaryl    Porokeratosis - cont steroids  Question of V tach - cardiology feels this was artifact due to tremor of her arms    Consultations:  cardiology  Discharge Exam: Filed Weights   10/11/15 0129 10/12/15 0515  Weight: 85.9 kg (189 lb 6 oz) 90.7 kg (199 lb 15.3 oz)   Filed Vitals:   10/11/15 2300 10/12/15 0515  BP: 141/77 105/67  Pulse: 81 79  Temp: 98.2 F (36.8 C) 98.4 F (36.9 C)  Resp: 20 18    General: AAO x 3, no distress Cardiovascular: RRR, no murmurs  Respiratory: clear to auscultation bilaterally GI: soft, non-tender, non-distended, bowel sound positive  Discharge Instructions You were cared for by a hospitalist during your hospital stay. If you have any questions about your discharge medications or the care you received while you were in the hospital after you are discharged, you can call the unit and asked to speak with the hospitalist on call if the hospitalist that took care of you is not available. Once you are discharged, your primary care physician will handle any further medical issues. Please note that NO REFILLS for any discharge medications will  be authorized once you are discharged, as it is imperative that you return to your primary care physician (or establish a relationship with a primary care physician if you do not have one) for your aftercare needs so that they can reassess your need for medications and monitor your lab values.      Discharge Instructions    Diet - low sodium heart healthy    Complete  by:  As directed      Increase activity slowly    Complete by:  As directed             Medication List    STOP taking these medications        ADVIL ALLERGY & CONGESTION 4-10-200 MG Tabs  Generic drug:  Chlorphen-Phenyleph-Ibuprofen      TAKE these medications        acetaminophen 325 MG tablet  Commonly known as:  TYLENOL  Take 2 tablets (650 mg total) by mouth every 6 (six) hours as needed for mild pain (or Fever >/= 101).     amLODipine 5 MG tablet  Commonly known as:  NORVASC  Take 1 tablet (5 mg total) by mouth daily.     apixaban 5 MG Tabs tablet  Commonly known as:  ELIQUIS  Take 1 tablet (5 mg total) by mouth 2 (two) times daily.     baclofen 10 MG tablet  Commonly known as:  LIORESAL  TAKE 1 TABLET BY MOUTH BID FOR MUSCLE STRAIN     calcium-vitamin D 500-200 MG-UNIT tablet  Commonly known as:  OSCAL WITH D  Take 1 tablet by mouth daily with breakfast.     [employer] SPECTRAVITE ADULT 50+ PO  Take 1 tablet by mouth daily.     gabapentin 300 MG capsule  Commonly known as:  NEURONTIN  Take 300 mg by mouth 3 (three) times daily.     glimepiride 2 MG tablet  Commonly known as:  AMARYL  Take 1 tablet (2 mg total) by mouth daily with breakfast.     LORazepam 1 MG tablet  Commonly known as:  ATIVAN  Take 0.5 tablets (0.5 mg total) by mouth every 12 (twelve) hours as needed for anxiety.     magnesium oxide 400 MG tablet  Commonly known as:  MAG-OX  Take 400 mg by mouth 2 (two) times daily.     omeprazole 20 MG capsule  Commonly known as:  PRILOSEC  Take 2 capsules (40 mg total) by mouth daily.     Oxycodone HCl 20 MG Tabs  Take 0.5 tablets by mouth 2 (two) times daily.     potassium chloride SA 20 MEQ tablet  Commonly known as:  K-DUR,KLOR-CON  Take 1 tablet (20 mEq total) by mouth daily.     predniSONE 5 MG tablet  Commonly known as:  DELTASONE  Take 3 tablets (15 mg total) by mouth daily with breakfast. Resume after taper.     propranolol 10 MG  tablet  Commonly known as:  INDERAL  Take 3 tablets (30 mg total) by mouth 3 (three) times daily.     rOPINIRole 0.5 MG tablet  Commonly known as:  REQUIP  Take 0.5 mg by mouth 2 (two) times daily. Give 1 tablet 1-3 hours before bedtime (around 1900) and than 1 tablet at bedtime (around 2200)     rosuvastatin 10 MG tablet  Commonly known as:  CRESTOR  Take 10 mg by mouth daily.     saccharomyces boulardii 250 MG capsule  Commonly known as:  FLORASTOR  Take 1 capsule (250 mg total) by mouth 2 (two) times daily.     tamsulosin 0.4 MG Caps capsule  Commonly known as:  FLOMAX  Take 0.4 mg by mouth at bedtime.     torsemide 20 MG tablet  Commonly known as:  DEMADEX  Take 1 tablet (20 mg total) by mouth daily.     traZODone 50 MG tablet  Commonly known as:  DESYREL  Take 50 mg by mouth at bedtime.     trifluoperazine 5 MG tablet  Commonly known as:  STELAZINE  Take 5 mg by mouth 2 (two) times daily as needed (for itching).       Allergies  Allergen Reactions  . Decadrol [Dexamethasone] Swelling    Swelling of face and stomach   . Sodium Phosphate Swelling    Swelling of face and stomach       The results of significant diagnostics from this hospitalization (including imaging, microbiology, ancillary and laboratory) are listed below for reference.    Significant Diagnostic Studies: Dg Chest 2 View  10/10/2015  CLINICAL DATA:  Dyspnea, onset early this morning and worsening throughout today. EXAM: CHEST  2 VIEW COMPARISON:  08/18/2015 FINDINGS: There is unchanged mild cardiomegaly. The lungs are clear. There is no pleural effusion. The pulmonary vasculature is normal. IMPRESSION: No active cardiopulmonary disease. Electronically Signed   By: Ellery Plunk M.D.   On: 10/10/2015 22:03   Dg Abd 1 View  10/10/2015  CLINICAL DATA:  Diffuse abdominal pain and distention, onset today. EXAM: ABDOMEN - 1 VIEW COMPARISON:  CT 08/09/2015 FINDINGS: The bowel gas pattern is normal.  Moderate stool burden. Enteric contrast within diverticula in the left colon. Oblong densities in the colon are likely related to pills. Portions of the right abdomen excluded from the field of view. No evidence of free air on supine view. Surgical clips in the right abdomen. There is degenerative change in the spine. IMPRESSION: Normal bowel gas pattern.  Moderate stool burden. Electronically Signed   By: Rubye Oaks M.D.   On: 10/10/2015 23:14    Microbiology: Recent Results (from the past 240 hour(s))  Urine culture     Status: None   Collection Time: 10/10/15  9:53 PM  Result Value Ref Range Status   Specimen Description URINE, CATHETERIZED  Final   Special Requests NONE  Final   Culture   Final    NO GROWTH 1 DAY Performed at [facility]    Report Status 10/12/2015 FINAL  Final     Labs: Basic Metabolic Panel:  Recent Labs Lab 10/10/15 2214 10/11/15 0514 10/11/15 1816 10/12/15 0518  NA 141 140 138 139  K 3.8 4.3 4.5 5.1  CL 96* 101 99* 100*  CO2 32 GLUCOSE 247* 346* 334* 266*  BUN CREATININE 0.73 0.57 0.59 0.68  CALCIUM 9.2 8.2* 9.0 9.0  MG  --   --  2.1  --    Liver Function Tests:  Recent Labs Lab 10/10/15 2214  AST 28  ALT 28  ALKPHOS 63  BILITOT 0.6  PROT 6.5  ALBUMIN 3.7    Recent Labs Lab 10/10/15 2214  LIPASE 42   No results for input(s): AMMONIA in the last 168 hours. CBC:  Recent Labs Lab 10/10/15 2214 10/11/15 0514 10/11/15 1816 10/12/15 0518  WBC 16.5* 15.0* 12.4* 13.6*  HGB 14.9 13.8 14.1 13.5  HCT 46.6* 43.8 43.2 41.5  MCV 85.3 84.2 83.4 84.9  PLT 241 215 237 280   Cardiac Enzymes: No results for input(s): CKTOTAL, CKMB, CKMBINDEX, TROPONINI in the last 168 hours. BNP: BNP (last 3 results)  Recent Labs  08/09/15 1050 08/18/15 1035 10/10/15 2214  BNP 130.8* 166.4* 72.9    ProBNP (last 3 results) No results for input(s): PROBNP in the last 8760 hours.  CBG:  Recent Labs Lab  10/11/15 1640 10/11/15 2304 10/12/15 0111 10/12/15 0739 10/12/15 1128  GLUCAP 329* 272* 295* 224* 386*       SignedCalvert Cantor, MD [facility] 10/12/2015, 12:23 PM

## 2015-10-12 NOTE — Progress Notes (Signed)
Brief Nutrition Note  Pt seen for low braden. No nutritional interventions are warranted at this time. Pt consuming 75-100% of meals. Skin was reviewed, no issues were found. Pt has not experienced any significant weight loss. Per chart, weight history is insignificant. Pt has weighed around 200 lbs for the past 7+ months. Weight history as follows:  10/12/15 199 lb 15.3 oz (90.7 kg)  08/20/15 199 lb 11.8 oz (90.6 kg)  08/16/15 201 lb 8 oz (91.4 kg)  05/03/15 206 lb 11.2 oz (93.759 kg)  If further nutritional needs develop, consult dietitian.   [name] QuantMeredith Jeren Dufrane, MS [facility] Dietetic Intern Pager [phone]

## 2015-10-13 LAB — RESPIRATORY VIRUS PANEL
ADENOVIRUS: NEGATIVE
INFLUENZA B 1: NEGATIVE
Influenza A: NEGATIVE
METAPNEUMOVIRUS: NEGATIVE
PARAINFLUENZA 3 A: NEGATIVE
Parainfluenza 1: NEGATIVE
Parainfluenza 2: NEGATIVE
RESPIRATORY SYNCYTIAL VIRUS A: NEGATIVE
RHINOVIRUS: NEGATIVE
Respiratory Syncytial Virus B: NEGATIVE

## 2015-10-16 LAB — CULTURE, BLOOD (ROUTINE X 2): Culture: NO GROWTH

## 2015-10-18 ENCOUNTER — Encounter (HOSPITAL_COMMUNITY): Payer: Self-pay | Admitting: *Deleted

## 2015-10-18 ENCOUNTER — Emergency Department (HOSPITAL_COMMUNITY): Payer: Medicare Other

## 2015-10-18 ENCOUNTER — Inpatient Hospital Stay (HOSPITAL_COMMUNITY): Payer: Medicare Other

## 2015-10-18 ENCOUNTER — Inpatient Hospital Stay (HOSPITAL_COMMUNITY)
Admission: EM | Admit: 2015-10-18 | Discharge: 2015-10-20 | DRG: 871 | Disposition: A | Discharge status: Home Health | Payer: Medicare Other | Attending: Internal Medicine | Admitting: Internal Medicine

## 2015-10-18 DIAGNOSIS — Z79899 Other long term (current) drug therapy: Secondary | ICD-10-CM | POA: Diagnosis not present

## 2015-10-18 DIAGNOSIS — I110 Hypertensive heart disease with heart failure: Secondary | ICD-10-CM | POA: Diagnosis present

## 2015-10-18 DIAGNOSIS — A419 Sepsis, unspecified organism: Principal | ICD-10-CM | POA: Diagnosis present

## 2015-10-18 DIAGNOSIS — Z87891 Personal history of nicotine dependence: Secondary | ICD-10-CM | POA: Diagnosis not present

## 2015-10-18 DIAGNOSIS — Z981 Arthrodesis status: Secondary | ICD-10-CM | POA: Diagnosis not present

## 2015-10-18 DIAGNOSIS — R000 Tachycardia, unspecified: Secondary | ICD-10-CM | POA: Diagnosis present

## 2015-10-18 DIAGNOSIS — N390 Urinary tract infection, site not specified: Secondary | ICD-10-CM | POA: Diagnosis present

## 2015-10-18 DIAGNOSIS — I5032 Chronic diastolic (congestive) heart failure: Secondary | ICD-10-CM | POA: Diagnosis present

## 2015-10-18 DIAGNOSIS — G8250 Quadriplegia, unspecified: Secondary | ICD-10-CM | POA: Diagnosis present

## 2015-10-18 DIAGNOSIS — Z6837 Body mass index (BMI) 37.0-37.9, adult: Secondary | ICD-10-CM | POA: Diagnosis not present

## 2015-10-18 DIAGNOSIS — E872 Acidosis: Secondary | ICD-10-CM | POA: Diagnosis present

## 2015-10-18 DIAGNOSIS — R319 Hematuria, unspecified: Secondary | ICD-10-CM

## 2015-10-18 DIAGNOSIS — Z794 Long term (current) use of insulin: Secondary | ICD-10-CM

## 2015-10-18 DIAGNOSIS — G4733 Obstructive sleep apnea (adult) (pediatric): Secondary | ICD-10-CM | POA: Diagnosis present

## 2015-10-18 DIAGNOSIS — G9340 Encephalopathy, unspecified: Secondary | ICD-10-CM | POA: Diagnosis present

## 2015-10-18 DIAGNOSIS — J9601 Acute respiratory failure with hypoxia: Secondary | ICD-10-CM | POA: Diagnosis present

## 2015-10-18 DIAGNOSIS — E662 Morbid (severe) obesity with alveolar hypoventilation: Secondary | ICD-10-CM

## 2015-10-18 DIAGNOSIS — E119 Type 2 diabetes mellitus without complications: Secondary | ICD-10-CM | POA: Diagnosis present

## 2015-10-18 DIAGNOSIS — Z87440 Personal history of urinary (tract) infections: Secondary | ICD-10-CM | POA: Diagnosis not present

## 2015-10-18 DIAGNOSIS — R4182 Altered mental status, unspecified: Secondary | ICD-10-CM | POA: Diagnosis not present

## 2015-10-18 DIAGNOSIS — R140 Abdominal distension (gaseous): Secondary | ICD-10-CM

## 2015-10-18 DIAGNOSIS — I482 Chronic atrial fibrillation: Secondary | ICD-10-CM | POA: Diagnosis present

## 2015-10-18 LAB — COMPREHENSIVE METABOLIC PANEL
ALBUMIN: 3 g/dL — AB (ref 3.5–5.0)
ALK PHOS: 61 U/L (ref 38–126)
ALK PHOS: 50 U/L (ref 38–126)
ALT: 29 U/L (ref 14–54)
ALT: 24 U/L (ref 14–54)
ANION GAP: 12 (ref 5–15)
AST: 22 U/L (ref 15–41)
AST: 19 U/L (ref 15–41)
Albumin: 3.5 g/dL (ref 3.5–5.0)
Anion gap: 7 (ref 5–15)
BILIRUBIN TOTAL: 0.6 mg/dL (ref 0.3–1.2)
BUN: 10 mg/dL (ref 6–20)
BUN: 9 mg/dL (ref 6–20)
CALCIUM: 8.9 mg/dL (ref 8.9–10.3)
CALCIUM: 8.1 mg/dL — AB (ref 8.9–10.3)
CO2: 33 mmol/L — ABNORMAL HIGH (ref 22–32)
CO2: 31 mmol/L (ref 22–32)
CREATININE: 0.56 mg/dL (ref 0.44–1.00)
Chloride: 91 mmol/L — ABNORMAL LOW (ref 101–111)
Chloride: 103 mmol/L (ref 101–111)
Creatinine, Ser: 0.46 mg/dL (ref 0.44–1.00)
GFR calc Af Amer: >60 mL/min (ref >60)
GFR calc non Af Amer: >60 mL/min (ref >60)
GLUCOSE: 216 mg/dL — AB (ref 65–99)
Glucose, Bld: 187 mg/dL — ABNORMAL HIGH (ref 65–99)
POTASSIUM: 3.8 mmol/L (ref 3.5–5.1)
Potassium: 3.9 mmol/L (ref 3.5–5.1)
Sodium: 136 mmol/L (ref 135–145)
Sodium: 141 mmol/L (ref 135–145)
TOTAL PROTEIN: 6.3 g/dL — AB (ref 6.5–8.1)
TOTAL PROTEIN: 5.5 g/dL — AB (ref 6.5–8.1)
Total Bilirubin: 0.7 mg/dL (ref 0.3–1.2)

## 2015-10-18 LAB — CBC WITH DIFFERENTIAL/PLATELET
BASOS ABS: 0.1 10*3/uL (ref 0.0–0.1)
BASOS PCT: 1 %
EOS ABS: 0.2 10*3/uL (ref 0.0–0.7)
Eosinophils Relative: 2 %
HEMATOCRIT: 44.9 % (ref 36.0–46.0)
HEMOGLOBIN: 14.4 g/dL (ref 12.0–15.0)
Lymphocytes Relative: 22 %
Lymphs Abs: 3.1 10*3/uL (ref 0.7–4.0)
MCH: 27.2 pg (ref 26.0–34.0)
MCHC: 32.1 g/dL (ref 30.0–36.0)
MCV: 84.7 fL (ref 78.0–100.0)
Monocytes Absolute: 0.9 10*3/uL (ref 0.1–1.0)
Monocytes Relative: 6 %
NEUTROS ABS: 9.7 10*3/uL — AB (ref 1.7–7.7)
NEUTROS PCT: 69 %
Platelets: 272 10*3/uL (ref 150–400)
RBC: 5.3 MIL/uL — AB (ref 3.87–5.11)
RDW: 14.9 % (ref 11.5–15.5)
WBC: 13.9 10*3/uL — AB (ref 4.0–10.5)

## 2015-10-18 LAB — BLOOD GAS, ARTERIAL
Acid-Base Excess: 6.6 mmol/L — ABNORMAL HIGH (ref 0.0–2.0)
Bicarbonate: 33.5 mEq/L — ABNORMAL HIGH (ref 20.0–24.0)
Drawn by: 257701
O2 CONTENT: 2 L/min
O2 Saturation: 90.6 %
PCO2 ART: 66.2 mmHg — AB (ref 35.0–45.0)
PH ART: 7.334 — AB (ref 7.350–7.450)
PO2 ART: 72.1 mmHg — AB (ref 80.0–100.0)
Patient temperature: 101.7
TCO2: 30.1 mmol/L (ref 0–100)

## 2015-10-18 LAB — GLUCOSE, CAPILLARY
Glucose-Capillary: 208 mg/dL — ABNORMAL HIGH (ref 65–99)
Glucose-Capillary: 263 mg/dL — ABNORMAL HIGH (ref 65–99)

## 2015-10-18 LAB — PROTIME-INR
INR: 1.72 — ABNORMAL HIGH (ref 0.00–1.49)
Prothrombin Time: 20.1 seconds — ABNORMAL HIGH (ref 11.6–15.2)

## 2015-10-18 LAB — URINALYSIS, ROUTINE W REFLEX MICROSCOPIC
BILIRUBIN URINE: NEGATIVE
Glucose, UA: NEGATIVE mg/dL
KETONES UR: NEGATIVE mg/dL
Nitrite: NEGATIVE
Protein, ur: 100 mg/dL — AB
SPECIFIC GRAVITY, URINE: 1.023 (ref 1.005–1.030)
pH: 7 (ref 5.0–8.0)

## 2015-10-18 LAB — URINE MICROSCOPIC-ADD ON

## 2015-10-18 LAB — I-STAT CG4 LACTIC ACID, ED: Lactic Acid, Venous: 2.37 mmol/L (ref 0.5–2.0)

## 2015-10-18 LAB — APTT: APTT: 33 s (ref 24–37)

## 2015-10-18 LAB — LACTIC ACID, PLASMA
LACTIC ACID, VENOUS: 1 mmol/L (ref 0.5–2.0)
LACTIC ACID, VENOUS: 0.9 mmol/L (ref 0.5–2.0)

## 2015-10-18 MED ORDER — ROSUVASTATIN CALCIUM 10 MG PO TABS
10.0000 mg | ORAL_TABLET | Freq: Every day | ORAL | Status: DC
  Administered 2015-10-18 – 2015-10-20 (×3): 10 mg via ORAL
  Filled 2015-10-18 (×3): qty 1

## 2015-10-18 MED ORDER — HEPARIN SODIUM (PORCINE) 5000 UNIT/ML IJ SOLN: 5000.0000 [IU] | Freq: Three times a day (TID) | INTRAMUSCULAR | Status: DC

## 2015-10-18 MED ORDER — ONDANSETRON HCL 4 MG PO TABS: 4.0000 mg | ORAL_TABLET | Freq: Four times a day (QID) | ORAL | Status: DC | PRN

## 2015-10-18 MED ORDER — SODIUM CHLORIDE 0.9 % IV BOLUS (SEPSIS)
1000.0000 mL | INTRAVENOUS | Status: AC
  Administered 2015-10-18 (×3): 1000 mL via INTRAVENOUS

## 2015-10-18 MED ORDER — PIPERACILLIN-TAZOBACTAM 3.375 G IVPB
3.3750 g | Freq: Once | INTRAVENOUS | Status: AC
  Administered 2015-10-18: 3.375 g via INTRAVENOUS
  Filled 2015-10-18: qty 50

## 2015-10-18 MED ORDER — SODIUM CHLORIDE 0.9 % IV BOLUS (SEPSIS)
1000.0000 mL | Freq: Once | INTRAVENOUS | Status: AC
  Administered 2015-10-18: 1000 mL via INTRAVENOUS

## 2015-10-18 MED ORDER — INSULIN ASPART 100 UNIT/ML ~~LOC~~ SOLN
0.0000 [IU] | SUBCUTANEOUS | Status: DC
  Administered 2015-10-18: 5 [IU] via SUBCUTANEOUS
  Administered 2015-10-19: 2 [IU] via SUBCUTANEOUS
  Administered 2015-10-19 (×2): 3 [IU] via SUBCUTANEOUS
  Administered 2015-10-19: 5 [IU] via SUBCUTANEOUS
  Administered 2015-10-19 (×2): 3 [IU] via SUBCUTANEOUS
  Administered 2015-10-20 (×3): 2 [IU] via SUBCUTANEOUS
  Administered 2015-10-20: 5 [IU] via SUBCUTANEOUS

## 2015-10-18 MED ORDER — FLUTICASONE PROPIONATE 50 MCG/ACT NA SUSP
1.0000 | Freq: Every day | NASAL | Status: DC
  Administered 2015-10-18 – 2015-10-20 (×3): 1 via NASAL
  Filled 2015-10-18: qty 16

## 2015-10-18 MED ORDER — ACETAMINOPHEN 650 MG RE SUPP: 650.0000 mg | Freq: Four times a day (QID) | RECTAL | Status: DC | PRN

## 2015-10-18 MED ORDER — SACCHAROMYCES BOULARDII 250 MG PO CAPS
250.0000 mg | ORAL_CAPSULE | Freq: Two times a day (BID) | ORAL | Status: DC
  Administered 2015-10-18 – 2015-10-20 (×4): 250 mg via ORAL
  Filled 2015-10-18 (×5): qty 1

## 2015-10-18 MED ORDER — APIXABAN 5 MG PO TABS
5.0000 mg | ORAL_TABLET | Freq: Two times a day (BID) | ORAL | Status: DC
  Administered 2015-10-18 – 2015-10-20 (×5): 5 mg via ORAL
  Filled 2015-10-18 (×6): qty 1

## 2015-10-18 MED ORDER — POLYETHYLENE GLYCOL 3350 17 G PO PACK: 17.0000 g | PACK | Freq: Every day | ORAL | Status: DC | PRN

## 2015-10-18 MED ORDER — ACETAMINOPHEN 325 MG PO TABS
650.0000 mg | ORAL_TABLET | Freq: Four times a day (QID) | ORAL | Status: DC | PRN
  Administered 2015-10-19: 650 mg via ORAL
  Filled 2015-10-18: qty 2

## 2015-10-18 MED ORDER — PROPRANOLOL HCL 10 MG PO TABS
10.0000 mg | ORAL_TABLET | Freq: Three times a day (TID) | ORAL | Status: DC
  Administered 2015-10-18 – 2015-10-20 (×5): 10 mg via ORAL
  Filled 2015-10-18 (×8): qty 1

## 2015-10-18 MED ORDER — ONDANSETRON HCL 4 MG/2ML IJ SOLN: 4.0000 mg | Freq: Four times a day (QID) | INTRAMUSCULAR | Status: DC | PRN

## 2015-10-18 MED ORDER — PIPERACILLIN-TAZOBACTAM 3.375 G IVPB
3.3750 g | Freq: Three times a day (TID) | INTRAVENOUS | Status: DC
  Administered 2015-10-18 – 2015-10-20 (×6): 3.375 g via INTRAVENOUS
  Filled 2015-10-18 (×7): qty 50

## 2015-10-18 MED ORDER — NALOXONE HCL 0.4 MG/ML IJ SOLN
0.4000 mg | Freq: Once | INTRAMUSCULAR | Status: AC
  Administered 2015-10-18: 0.4 mg via INTRAVENOUS
  Filled 2015-10-18: qty 1

## 2015-10-18 NOTE — Progress Notes (Signed)
Pharmacy Antibiotic Follow-up Note  [name] MeekerLinda [name] is a 70 y.o. year-old female admitted on 10/18/2015.  The patient is currently on day 1 of Zosyn for presumed sepsis.  Assessment/Plan: Zosyn 3.375g IV q8h (4 hour infusion).  Temp (24hrs), Avg:100.1 F (37.8 C), Min:99 F (37.2 C), Max:101.7 F (38.7 C)   Recent Labs Lab 10/11/15 1816 10/12/15 0518 10/18/15 1038  WBC 12.4* 13.6* 13.9*    Recent Labs Lab 10/11/15 1816 10/12/15 0518 10/18/15 1038  CREATININE 0.59 0.68 0.56   Estimated Creatinine Clearance: 66 mL/min (by C-G formula based on Cr of 0.56).    Allergies  Allergen Reactions  . Decadrol [Dexamethasone] Swelling    Swelling of face and stomach   . Sodium Phosphate Swelling    Swelling of face and stomach    Antimicrobials this admission: 4/13 Zosyn >> Levels/dose changes this admission:  Microbiology results: 4/13 BCx: sent 4/13 UCx: sent  4/6 Resp virus panel: neg  4/6 BCx: 1/2 GPR, diphtheroids 4/5 UCx: no growth-final  Thank you for allowing pharmacy to be a part of this patient's care.  [name] BellowsGreen, [name] [name] L PharmD 10/18/2015 1:31 PM

## 2015-10-18 NOTE — ED Notes (Signed)
Unable to obtain a second set of blood cultures before time to start antibiotics.

## 2015-10-18 NOTE — ED Provider Notes (Signed)
CSN: [id number]     Arrival date & time 10/18/15  4098 History   First MD Initiated Contact with Patient 10/18/15 [phone]     Chief Complaint  Patient presents with  . Altered Mental Status     (Consider location/radiation/quality/duration/timing/severity/associated sxs/prior Treatment) Patient is a 70 y.o. female presenting with altered mental status. The history is provided by the patient.  Altered Mental Status Presenting symptoms: behavior changes, confusion, disorientation and lethargy   Severity:  Moderate Most recent episode:  Today Episode history:  Continuous Duration:  1 day Timing:  Constant Progression:  Worsening Chronicity:  New Context: recent infection   Associated symptoms: bladder incontinence, fever and slurred speech   Associated symptoms: no vomiting     Past Medical History  Diagnosis Date  . GERD (gastroesophageal reflux disease)   . Quadriplegia (HCC)     a. 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  . Hypertension   . Pressure ulcer of foot   . Chronic pain disorder   . Hyperlipidemia   . Occasional tremors   . Chronic diastolic CHF (congestive heart failure) (HCC)   . Diabetes mellitus (HCC)   . History of frequent urinary tract infections   . History of ESBL E. coli infection   . Sepsis (HCC)     a. several prior adm for sepsis.  . Atrial fibrillation (HCC)     a. Observed during 08/2015 admission.  . Pericardial effusion     a. Observed during 08/2015 admission - moderate.  . Pulmonary nodules    Past Surgical History  Procedure Laterality Date  . Back surgery  08/2013  . Abdominal hysterectomy     Family History  Problem Relation Age of Onset  . Adopted: Yes   Social History  Substance Use Topics  . Smoking status: Former Smoker -- 0.25 packs/day    Types: Cigarettes  . Smokeless tobacco: Never Used  . Alcohol Use: No   OB History    No data available     Review of Systems   Constitutional: Positive for fever.  Gastrointestinal: Negative for vomiting.  Genitourinary: Positive for bladder incontinence.  Psychiatric/Behavioral: Positive for confusion.  All other systems reviewed and are negative.     Allergies  Decadrol and Sodium phosphate  Home Medications   Prior to Admission medications   Medication Sig Start Date End Date Taking? Authorizing Provider  acetaminophen (TYLENOL) 325 MG tablet Take 2 tablets (650 mg total) by mouth every 6 (six) hours as needed for mild pain (or Fever >/= 101). 05/03/15   Belkys A Regalado, MD  amLODipine (NORVASC) 5 MG tablet Take 1 tablet (5 mg total) by mouth daily. 08/16/15   Rodolph Bong, MD  apixaban (ELIQUIS) 5 MG TABS tablet Take 1 tablet (5 mg total) by mouth 2 (two) times daily. 08/16/15   Rodolph Bong, MD  baclofen (LIORESAL) 10 MG tablet TAKE 1 TABLET BY MOUTH BID FOR MUSCLE STRAIN 09/07/15   Historical Provider, MD  calcium-vitamin D (OSCAL WITH D) 500-200 MG-UNIT per tablet Take 1 tablet by mouth daily with breakfast.    Historical Provider, MD  gabapentin (NEURONTIN) 300 MG capsule Take 300 mg by mouth 3 (three) times daily.    Historical Provider, MD  glimepiride (AMARYL) 2 MG tablet Take 1 tablet (2 mg total) by mouth daily with breakfast. 08/16/15   Rodolph Bong, MD  LORazepam (ATIVAN) 1 MG tablet Take 0.5  tablets (0.5 mg total) by mouth every 12 (twelve) hours as needed for anxiety. Patient taking differently: Take 1 mg by mouth 2 (two) times daily.  05/03/15   Belkys A Regalado, MD  magnesium oxide (MAG-OX) 400 MG tablet Take 400 mg by mouth 2 (two) times daily.    Historical Provider, MD  Multiple Vitamins-Minerals ([employer]S SPECTRAVITE ADULT 50+ PO) Take 1 tablet by mouth daily.    Historical Provider, MD  omeprazole (PRILOSEC) 20 MG capsule Take 2 capsules (40 mg total) by mouth daily. 08/21/15   Renae Fickle, MD  Oxycodone HCl 20 MG TABS Take 0.5 tablets by mouth 2 (two) times daily.  09/16/15    Historical Provider, MD  potassium chloride SA (K-DUR,KLOR-CON) 20 MEQ tablet Take 1 tablet (20 mEq total) by mouth daily. 05/03/15   Belkys A Regalado, MD  predniSONE (DELTASONE) 5 MG tablet Take 3 tablets (15 mg total) by mouth daily with breakfast. Resume after taper. 08/21/15   Rodolph Bong, MD  propranolol (INDERAL) 10 MG tablet Take 3 tablets (30 mg total) by mouth 3 (three) times daily. 08/16/15   Rodolph Bong, MD  rOPINIRole (REQUIP) 0.5 MG tablet Take 0.5 mg by mouth 2 (two) times daily. Give 1 tablet 1-3 hours before bedtime (around 1900) and than 1 tablet at bedtime (around 2200)    Historical Provider, MD  rosuvastatin (CRESTOR) 10 MG tablet Take 10 mg by mouth daily. 08/07/15   Historical Provider, MD  saccharomyces boulardii (FLORASTOR) 250 MG capsule Take 1 capsule (250 mg total) by mouth 2 (two) times daily. 04/27/15   Vassie Loll, MD  tamsulosin (FLOMAX) 0.4 MG CAPS capsule Take 0.4 mg by mouth at bedtime.     Historical Provider, MD  torsemide (DEMADEX) 20 MG tablet Take 1 tablet (20 mg total) by mouth daily. 05/03/15   Belkys A Regalado, MD  traZODone (DESYREL) 50 MG tablet Take 50 mg by mouth at bedtime. 04/14/15   Historical Provider, MD  trifluoperazine (STELAZINE) 5 MG tablet Take 5 mg by mouth 2 (two) times daily as needed (for itching).    Historical Provider, MD   BP 129/66 mmHg  Pulse 103  Temp(Src) 99.7 F (37.6 C) (Oral)  Resp 18  SpO2 89% Physical Exam  Constitutional: She appears well-developed and well-nourished. She appears listless. No distress.  Morbidly obese  HENT:  Head: Normocephalic.  Eyes: Conjunctivae are normal.  Bilateral constricted pupils  Neck: Neck supple. No tracheal deviation present.  Cardiovascular: Normal rate and regular rhythm.   Pulmonary/Chest: Effort normal. No respiratory distress. She has no wheezes. She has no rales.  Low tidal volumes  Abdominal: Soft. She exhibits no distension.  Neurological: She appears listless.  She is disoriented. No cranial nerve deficit. GCS eye subscore is 4. GCS verbal subscore is 4. GCS motor subscore is 6.  Skin: Skin is warm and dry.  Psychiatric: She has a normal mood and affect.    ED Course  Procedures (including critical care time)  CRITICAL CARE Performed by: Lyndal Pulley Total critical care time: 30 minutes Critical care time was exclusive of separately billable procedures and treating other patients. Critical care was necessary to treat or prevent imminent or life-threatening deterioration. Critical care was time spent personally by me on the following activities: development of treatment plan with patient and/or surrogate as well as nursing, discussions with consultants, evaluation of patient's response to treatment, examination of patient, obtaining history from patient or surrogate, ordering and performing treatments and interventions, ordering  and review of laboratory studies, ordering and review of radiographic studies, pulse oximetry and re-evaluation of patient's condition.  Procedure note: Ultrasound Guided Peripheral IV Ultrasound guided peripheral 1.88 inch angiocath IV placement performed by me. Indications: Nursing unable to place IV. Details: The antecubital fossa and upper arm were evaluated with a multifrequency linear probe. Patent brachial veins were noted. 1 attempt was made to cannulate a vein under realtime US guidance with successful cannulation of the vein and catheter placement. There is return of non-pulsatile dark red blood. The patient tolerated the procedure well without complications. Images archived electronically.  CPT codes: 1610976937 and 36410   Labs Review Labs Reviewed  COMPREHENSIVE METABOLIC PANEL - Abnormal; Notable for the following:    Chloride 91 (*)    CO2 33 (*)    Glucose, Bld 187 (*)    Total Protein 6.3 (*)    All other components within normal limits  CBC WITH DIFFERENTIAL/PLATELET - Abnormal; Notable for the following:     WBC 13.9 (*)    RBC 5.30 (*)    Neutro Abs 9.7 (*)    All other components within normal limits  URINALYSIS, ROUTINE W REFLEX MICROSCOPIC (NOT AT [facility]) - Abnormal; Notable for the following:    Color, Urine AMBER (*)    APPearance TURBID (*)    Hgb urine dipstick LARGE (*)    Protein, ur 100 (*)    Leukocytes, UA LARGE (*)    All other components within normal limits  BLOOD GAS, ARTERIAL - Abnormal; Notable for the following:    pH, Arterial 7.334 (*)    pCO2 arterial 66.2 (*)    pO2, Arterial 72.1 (*)    Bicarbonate 33.5 (*)    Acid-Base Excess 6.6 (*)    All other components within normal limits  URINE MICROSCOPIC-ADD ON - Abnormal; Notable for the following:    Squamous Epithelial / LPF 6-30 (*)    Bacteria, UA MANY (*)    All other components within normal limits  I-STAT CG4 LACTIC ACID, ED - Abnormal; Notable for the following:    Lactic Acid, Venous 2.37 (*)    All other components within normal limits  CULTURE, BLOOD (ROUTINE X 2)  CULTURE, BLOOD (ROUTINE X 2)  URINE CULTURE    Imaging Review Dg Chest Port 1 View  10/18/2015  CLINICAL DATA:  Fever and confusion. EXAM: PORTABLE CHEST 1 VIEW COMPARISON:  10/10/2015 FINDINGS: The patient is slightly rotated to the right. The cardiac silhouette remains mildly enlarged. No airspace consolidation, edema, pleural effusion, or pneumothorax is identified. No acute osseous abnormality is seen. Prior cervical spine fusion is partially visualized. IMPRESSION: No active disease. Electronically Signed   By: Sebastian AcheAllen  Grady M.D.   On: 10/18/2015 10:35   I have personally reviewed and evaluated these images and lab results as part of my medical decision-making.   EKG Interpretation None      MDM   Final diagnoses:  Sepsis, due to unspecified organism [facility](HCC)  Urinary tract infection with hematuria, site unspecified  Hypoventilation associated with obesity (HCC)  Acute respiratory failure with hypoxemia (HCC)    70 y.o. female  presents with altered mental status starting today. She has a history of obstructive sleep apnea on CPAP intermittently at home. On arrival here she is desaturating to 89% with shallow respirations, is disoriented with some tachycardia and low-grade oral fever. I am concerned for sepsis presentation given her recent hospitalization and clinical appearance. She was given Zosyn empirically as she  has had Escherichia coli cephalosporin resistant urine cultures previously. She was fluid resuscitated with multiple boluses. Considered obesity hypoventilation syndrome in conjunction with narcotic pain control causing hypercapnia which could result in this clinical appearance so blood gas was drawn for screening purposes.  Urine appears infected, lactate mildly elevated. Patient meets clinical criteria for sepsis with at least 2 SIRS and suspected source of urinary tract infection.     Hospitalist was consulted for admission and will see the patient in the emergency department.   Lyndal Pulley, MD 10/18/15 [phone]

## 2015-10-18 NOTE — H&P (Signed)
[facility] History and Physical  [name] ZOX:[id number] DOB: 1946/07/06 DOA: 10/18/2015  Referring physician: Dr. Clydene Pugh PCP: [name], MD   Chief Complaint: confusion and abd pain  HPI: [name] [name] is a 70 y.o. female past medical history of hypertension, diabetes mellitus last hemoglobin A1c of 9.3, obstructive sleep apnea chronic diastolic heart failure atrial fibrillation on Eliquis presents to the ED with abdominal pain and confusion started on the day of admission. She had a mild temperature of 101 in the ED. He cannot provide a lot of history as she is mildly confused. And tangential on her answers. She denies any dysuria burning when she urinates, fever or cough. In the ED: Her temperature was 101, she has mild leukocytosis with mild elevation of lactic acid and she has been fluid resuscitated in the ED.   Review of Systems:  Constitutional:  No weight loss, night sweats, Fevers, chills, fatigue.  HEENT:  No headaches, Difficulty swallowing,Tooth/dental problems,Sore throat,  No sneezing, itching, ear ache, nasal congestion, post nasal drip,  Cardio-vascular:  No chest pain, Orthopnea, PND, swelling in lower extremities, anasarca, dizziness, palpitations  GI:  No heartburn, indigestion, abdominal pain, nausea, vomiting, diarrhea, change in bowel habits, loss of appetite  Resp:  No shortness of breath with exertion or at rest. No excess mucus, no productive cough, No non-productive cough, No coughing up of blood.No change in color of mucus.No wheezing.No chest wall deformity  Skin:  no rash or lesions.  GU:  no dysuria, change in color of urine, no urgency or frequency. No flank pain.  Musculoskeletal:  No joint pain or swelling. No decreased range of motion. No back pain.  Psych:  No change in mood or affect. No depression or anxiety. No memory loss.   Past Medical History  Diagnosis Date  . GERD (gastroesophageal reflux disease)   . Quadriplegia  (HCC)     a. 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  . Hypertension   . Pressure ulcer of foot   . Chronic pain disorder   . Hyperlipidemia   . Occasional tremors   . Chronic diastolic CHF (congestive heart failure) (HCC)   . Diabetes mellitus (HCC)   . History of frequent urinary tract infections   . History of ESBL E. coli infection   . Sepsis (HCC)     a. several prior adm for sepsis.  . Atrial fibrillation (HCC)     a. Observed during 08/2015 admission.  . Pericardial effusion     a. Observed during 08/2015 admission - moderate.  . Pulmonary nodules    Past Surgical History  Procedure Laterality Date  . Back surgery  08/2013  . Abdominal hysterectomy     Social History:  reports that she has quit smoking. Her smoking use included Cigarettes. She smoked 0.25 packs per day. She has never used smokeless tobacco. She reports that she does not drink alcohol or use illicit drugs.  Allergies  Allergen Reactions  . Decadrol [Dexamethasone] Swelling    Swelling of face and stomach   . Sodium Phosphate Swelling    Swelling of face and stomach     Family History  Problem Relation Age of Onset  . Adopted: Yes     Prior to Admission medications   Medication Sig Start Date End Date Taking? Authorizing Provider  acetaminophen (TYLENOL) 325 MG tablet Take 2 tablets (650 mg total) by mouth every 6 (six) hours as needed  for mild pain (or Fever >/= 101). 05/03/15  Yes Belkys A Regalado, MD  amLODipine (NORVASC) 5 MG tablet Take 1 tablet (5 mg total) by mouth daily. 08/16/15  Yes Rodolph Bonganiel Thompson V, MD  apixaban (ELIQUIS) 5 MG TABS tablet Take 1 tablet (5 mg total) by mouth 2 (two) times daily. 08/16/15  Yes Rodolph Bonganiel Thompson V, MD  baclofen (LIORESAL) 10 MG tablet TAKE 1 TABLET BY MOUTH BID FOR MUSCLE STRAIN 09/07/15  Yes Historical Provider, MD  calcium-vitamin D (OSCAL WITH D) 500-200 MG-UNIT per tablet Take 1 tablet by mouth daily with  breakfast.   Yes Historical Provider, MD  gabapentin (NEURONTIN) 300 MG capsule Take 300 mg by mouth 3 (three) times daily.   Yes Historical Provider, MD  glimepiride (AMARYL) 2 MG tablet Take 1 tablet (2 mg total) by mouth daily with breakfast. 08/16/15  Yes Rodolph Bonganiel Thompson V, MD  LORazepam (ATIVAN) 1 MG tablet Take 0.5 tablets (0.5 mg total) by mouth every 12 (twelve) hours as needed for anxiety. Patient taking differently: Take 1 mg by mouth 2 (two) times daily.  05/03/15  Yes Belkys A Regalado, MD  magnesium oxide (MAG-OX) 400 MG tablet Take 400 mg by mouth 2 (two) times daily.   Yes Historical Provider, MD  Multiple Vitamins-Minerals ([employer] SPECTRAVITE ADULT 50+ PO) Take 1 tablet by mouth daily.   Yes Historical Provider, MD  omeprazole (PRILOSEC) 20 MG capsule Take 2 capsules (40 mg total) by mouth daily. 08/21/15  Yes Renae FickleMackenzie Short, MD  Oxycodone HCl 20 MG TABS Take 0.5 tablets by mouth 2 (two) times daily.  09/16/15  Yes Historical Provider, MD  potassium chloride SA (K-DUR,KLOR-CON) 20 MEQ tablet Take 1 tablet (20 mEq total) by mouth daily. 05/03/15  Yes Belkys A Regalado, MD  predniSONE (DELTASONE) 5 MG tablet Take 3 tablets (15 mg total) by mouth daily with breakfast. Resume after taper. Patient taking differently: Take 15 mg by mouth daily with breakfast.  08/21/15  Yes Rodolph Bonganiel Thompson V, MD  Pseudoephedrine-Ibuprofen (ADVIL COLD/SINUS PO) Take 1 tablet by mouth 2 (two) times daily as needed (cold symptoms).   Yes Historical Provider, MD  rOPINIRole (REQUIP) 0.5 MG tablet Take 0.5 mg by mouth See admin instructions. Give 1 tablet 1-3 hours before bedtime (around 1900) and than 1 tablet at bedtime (around 2200)   Yes Historical Provider, MD  rosuvastatin (CRESTOR) 10 MG tablet Take 10 mg by mouth daily. 08/07/15  Yes Historical Provider, MD  saccharomyces boulardii (FLORASTOR) 250 MG capsule Take 1 capsule (250 mg total) by mouth 2 (two) times daily. 04/27/15  Yes Vassie Lollarlos Madera, MD  tamsulosin  (FLOMAX) 0.4 MG CAPS capsule Take 0.4 mg by mouth at bedtime.    Yes Historical Provider, MD  torsemide (DEMADEX) 20 MG tablet Take 1 tablet (20 mg total) by mouth daily. 05/03/15  Yes Belkys A Regalado, MD  traZODone (DESYREL) 50 MG tablet Take 50 mg by mouth at bedtime. 04/14/15  Yes Historical Provider, MD  trifluoperazine (STELAZINE) 5 MG tablet Take 5 mg by mouth 2 (two) times daily as needed (for itching).   Yes Historical Provider, MD  propranolol (INDERAL) 10 MG tablet Take 3 tablets (30 mg total) by mouth 3 (three) times daily. 08/16/15   Rodolph Bonganiel Thompson V, MD   Physical Exam: Filed Vitals:   10/18/15 1100 10/18/15 1105 10/18/15 1115 10/18/15 1130  BP:   136/100 143/51  Pulse:   102 100  Temp: 101.7 F (38.7 C)     TempSrc: Rectal  Resp:   19 24  Height:   (1.549 m)    Weight:  90.266 kg (199 lb)    SpO2:   96% 95%    Wt Readings from Last 3 Encounters:  10/18/15 90.266 kg (199 lb)  10/12/15 90.7 kg (199 lb 15.3 oz)  08/20/15 90.6 kg (199 lb 11.8 oz)    General:  Appears calm and comfortable Eyes: PERRL, normal lids, irises & conjunctiva ENT: grossly normal hearing, lips & tongue Neck: no LAD, masses or thyromegaly Cardiovascular: RRR, no m/r/g. No LE edema. Telemetry: SR, no arrhythmias  Respiratory: CTA bilaterally, no w/r/r. Normal respiratory effort. Abdomen: soft, with suprapubic tenderness no rebound or guarding abdomen is distended with positive bowel sounds Skin: no rash or induration seen on limited exam Musculoskeletal: grossly normal tone BUE/BLE Psychiatric: grossly normal mood and affect, speech fluent and appropriate Neurologic: grossly non-focal.          Labs on Admission:  Basic Metabolic Panel:  Recent Labs Lab 10/11/15 1816 10/12/15 0518 10/18/15 1038  NA 138 139 136  K 4.5 5.1 3.9  CL 99* 100* 91*  CO2 28 30 33*  GLUCOSE 334* 266* 187*  BUN CREATININE 0.59 0.68 0.56  CALCIUM 9.0 9.0 8.9  MG 2.1  --   --    Liver  Function Tests:  Recent Labs Lab 10/18/15 1038  AST 22  ALT 29  ALKPHOS 61  BILITOT 0.7  PROT 6.3*  ALBUMIN 3.5   No results for input(s): LIPASE, AMYLASE in the last 168 hours. No results for input(s): AMMONIA in the last 168 hours. CBC:  Recent Labs Lab 10/11/15 1816 10/12/15 0518 10/18/15 1038  WBC 12.4* 13.6* 13.9*  NEUTROABS  --   --  9.7*  HGB 14.1 13.5 14.4  HCT 43.2 41.5 44.9  MCV 83.4 84.9 84.7  PLT 237 280 272   Cardiac Enzymes: No results for input(s): CKTOTAL, CKMB, CKMBINDEX, TROPONINI in the last 168 hours.  BNP (last 3 results)  Recent Labs  08/09/15 1050 08/18/15 1035 10/10/15 2214  BNP 130.8* 166.4* 72.9    ProBNP (last 3 results) No results for input(s): PROBNP in the last 8760 hours.  CBG:  Recent Labs Lab 10/11/15 1640 10/11/15 2304 10/12/15 0111 10/12/15 0739 10/12/15 1128  GLUCAP 329* 272* 295* 224* 386*    Radiological Exams on Admission: Dg Chest Port 1 View  10/18/2015  CLINICAL DATA:  Fever and confusion. EXAM: PORTABLE CHEST 1 VIEW COMPARISON:  10/10/2015 FINDINGS: The patient is slightly rotated to the right. The cardiac silhouette remains mildly enlarged. No airspace consolidation, edema, pleural effusion, or pneumothorax is identified. No acute osseous abnormality is seen. Prior cervical spine fusion is partially visualized. IMPRESSION: No active disease. Electronically Signed   By: Sebastian Ache M.D.   On: 10/18/2015 10:35    EKG: Independently reviewed. none  Assessment/Plan Acute encephalopathy/ Sepsis (HCC) due to UTI (lower urinary tract infection) Acute encephalopathy is likely due to her urinary tract infection. She has been started empirically on Zosyn in the ED as she has in the past had persistent Escherichia coli to cephalosporins. Blood cultures and urine cultures were drawn, in the past her urinary tract infections have been sensitive to Zosyn. She has a mild lactic acidosis and she has been fluid  resuscitated in the ED. At this time we'll hold all sedatives like narcotics and benzodiazepines as this may cloud the picture of her encephalopathy. - Hold her torsemide, neurontin,  trazadone and Requip. - Place her nothing by mouth as this puts her at risk of aspiration. - His x-ray was done and appears to have no acute findings.  Chronic atrial fibrillation: Rate control continue beta blockers and Eliquis.  Chronic diastolic (congestive) heart failure (HCC) Seems to be euvolemic. Hold diuretics continue beta blockers.  Abdominal distension: Only 2 WBC of point relation syndrome, get an abdominal x-ray.    Type 2 diabetes, HbA1c goal < 7% (HCC) We'll hold all of her oral hypoglycemic agents, as she is encephalopathic, will place her nothing by mouth continue sliding scale insulin. Can be reevaluated in the morning. Hemoglobin A1c.    Code Status: full DVT Prophylaxis:heparin Family Communication: none, I have tried called her son [name] but have been unsuccessful. Disposition Plan: inpatient  Time spent: 70 min  Marinda Elk [facility] Pager [phone]

## 2015-10-18 NOTE — ED Notes (Signed)
Per EMS report: pt coming from home and presents to the ED with altered mental status.  Pt is oriented to self and location at this time. Pt hx of sepsis and was recently dx with a UTI but was not prescribed antibiotics.  EMS reports pt had a fever of 101.62F, RR of 30, CBG of 160, HR 95 and 87% on RA.  EMS's 12-lead was unremarkable.  On arrival to ED, pt is verbal but slow to respond to questions asked by staff.

## 2015-10-18 NOTE — Progress Notes (Signed)
Lab called and reported that labs that were resulted were clearly contaminated/diluted, due to multiple boluses over afternoon.  Labs will be rescheduled for 9 pm, to attempt to get a more accurate specimen.

## 2015-10-19 DIAGNOSIS — G9340 Encephalopathy, unspecified: Secondary | ICD-10-CM

## 2015-10-19 DIAGNOSIS — R140 Abdominal distension (gaseous): Secondary | ICD-10-CM

## 2015-10-19 DIAGNOSIS — N390 Urinary tract infection, site not specified: Secondary | ICD-10-CM

## 2015-10-19 DIAGNOSIS — A419 Sepsis, unspecified organism: Principal | ICD-10-CM

## 2015-10-19 DIAGNOSIS — I5032 Chronic diastolic (congestive) heart failure: Secondary | ICD-10-CM

## 2015-10-19 DIAGNOSIS — E119 Type 2 diabetes mellitus without complications: Secondary | ICD-10-CM

## 2015-10-19 LAB — CBC WITH DIFFERENTIAL/PLATELET

## 2015-10-19 LAB — CBC
HEMATOCRIT: 47.1 % — AB (ref 36.0–46.0)
HEMOGLOBIN: 14.6 g/dL (ref 12.0–15.0)
MCH: 27.4 pg (ref 26.0–34.0)
MCHC: 31 g/dL (ref 30.0–36.0)
MCV: 88.5 fL (ref 78.0–100.0)
Platelets: 192 10*3/uL (ref 150–400)
RBC: 5.32 MIL/uL — AB (ref 3.87–5.11)
RDW: 15.5 % (ref 11.5–15.5)
WBC: 11.7 10*3/uL — AB (ref 4.0–10.5)

## 2015-10-19 LAB — BASIC METABOLIC PANEL
ANION GAP: 8 (ref 5–15)
BUN: 9 mg/dL (ref 6–20)
CHLORIDE: 102 mmol/L (ref 101–111)
CO2: 29 mmol/L (ref 22–32)
Calcium: 8.3 mg/dL — ABNORMAL LOW (ref 8.9–10.3)
Creatinine, Ser: 0.43 mg/dL — ABNORMAL LOW (ref 0.44–1.00)
GFR calc non Af Amer: >60 mL/min (ref >60)
Glucose, Bld: 184 mg/dL — ABNORMAL HIGH (ref 65–99)
POTASSIUM: 5.1 mmol/L (ref 3.5–5.1)
Sodium: 139 mmol/L (ref 135–145)

## 2015-10-19 LAB — APTT

## 2015-10-19 LAB — GLUCOSE, CAPILLARY
GLUCOSE-CAPILLARY: 175 mg/dL — AB (ref 65–99)
Glucose-Capillary: 162 mg/dL — ABNORMAL HIGH (ref 65–99)
Glucose-Capillary: 174 mg/dL — ABNORMAL HIGH (ref 65–99)
Glucose-Capillary: 159 mg/dL — ABNORMAL HIGH (ref 65–99)
Glucose-Capillary: 129 mg/dL — ABNORMAL HIGH (ref 65–99)
Glucose-Capillary: 219 mg/dL — ABNORMAL HIGH (ref 65–99)

## 2015-10-19 LAB — URINE CULTURE

## 2015-10-19 LAB — HEMOGLOBIN A1C
HEMOGLOBIN A1C: 9.7 % — AB (ref 4.8–5.6)
MEAN PLASMA GLUCOSE: 232 mg/dL

## 2015-10-19 LAB — PROTIME-INR

## 2015-10-19 LAB — PROCALCITONIN

## 2015-10-19 MED ORDER — LORAZEPAM 1 MG PO TABS
1.0000 mg | ORAL_TABLET | Freq: Two times a day (BID) | ORAL | Status: DC
  Administered 2015-10-19: 1 mg via ORAL
  Filled 2015-10-19: qty 1

## 2015-10-19 MED ORDER — LORAZEPAM 0.5 MG PO TABS
0.5000 mg | ORAL_TABLET | Freq: Two times a day (BID) | ORAL | Status: DC
  Administered 2015-10-20: 0.5 mg via ORAL
  Filled 2015-10-19 (×2): qty 1

## 2015-10-19 MED ORDER — GABAPENTIN 300 MG PO CAPS
300.0000 mg | ORAL_CAPSULE | Freq: Three times a day (TID) | ORAL | Status: DC
  Administered 2015-10-19: 300 mg via ORAL
  Filled 2015-10-19 (×3): qty 1

## 2015-10-19 MED ORDER — NALOXONE HCL 0.4 MG/ML IJ SOLN
0.4000 mg | INTRAMUSCULAR | Status: DC | PRN
  Administered 2015-10-19: 0.4 mg via INTRAVENOUS
  Filled 2015-10-19: qty 1

## 2015-10-19 MED ORDER — BACLOFEN 10 MG PO TABS
10.0000 mg | ORAL_TABLET | Freq: Two times a day (BID) | ORAL | Status: DC
  Administered 2015-10-19: 10 mg via ORAL
  Filled 2015-10-19 (×2): qty 1

## 2015-10-19 MED ORDER — TAMSULOSIN HCL 0.4 MG PO CAPS
0.4000 mg | ORAL_CAPSULE | Freq: Every day | ORAL | Status: DC
  Administered 2015-10-19: 0.4 mg via ORAL
  Filled 2015-10-19 (×2): qty 1

## 2015-10-19 NOTE — Progress Notes (Signed)
Ativan held due to patient being too sleepy

## 2015-10-19 NOTE — Progress Notes (Signed)
[facility]                                                                              Patient Demographics  [name] [name], is a 70 y.o. female, DOB - [birth year], ZOX:[id number]  Admit date - 10/18/2015   Admitting Physician Marinda Elk, MD  Outpatient Primary MD for the patient is [name], MD  LOS - 1   Chief Complaint  Patient presents with  . Altered Mental Status      HPI on 10/18/2015 by Dr. Rosine Beat [name] [name] is a 70 y.o. female past medical history of hypertension, diabetes mellitus last hemoglobin A1c of 9.3, obstructive sleep apnea chronic diastolic heart failure atrial fibrillation on Eliquis presents to the ED with abdominal pain and confusion started on the day of admission. She had a mild temperature of 101 in the ED. He cannot provide a lot of history as she is mildly confused. And tangential on her answers. She denies any dysuria burning when she urinates, fever or cough. In the ED: Her temperature was 101, she has mild leukocytosis with mild elevation of lactic acid and she has been fluid resuscitated in the ED.   Assessment & Plan   Sepsis secondary to UTI -Upon admission, patient was tachycardic, febrile with leukocytosis -UA showed TNTC WBC, large leukocytes, many bacteria -Continue Zosyn -Urine culture and blood cultures pending  Acute encephalopathy -Secondary to sepsis versus oversedation with medications -Resolving -Gabapentin, within held. Lorazepam made when necessary  Chronic Atrial fibrillation -CHADSVASC 5 -Continue Eliquis and propranolol  Chronic diastolic heart failure -Echocardiogram 08/13/2015 showed EF of 65-70%  Abdominal distention -Abdominal x-ray showed no acute finding, no evidence of bowel obstruction, moderate colonic stool burden  Diabetes mellitus, type II -Continue insulin sliding scale CBG monitoring  Code Status: Full  Family Communication: None at bedside  Disposition Plan:  Admitted  Time Spent in minutes   30 minutes  Procedures  None  Consults   None   DVT Prophylaxis  Eliquis  Lab Results  Component Value Date   PLT 192 10/19/2015    Medications  Scheduled Meds: . apixaban  5 mg Oral BID  . fluticasone  1 spray Each Nare Daily  . insulin aspart  0-15 Units Subcutaneous 6 times per day  . LORazepam  0.5 mg Oral BID  . piperacillin-tazobactam (ZOSYN)  IV  3.375 g Intravenous Q8H  . propranolol  10 mg Oral TID  . rosuvastatin  10 mg Oral Daily  . saccharomyces boulardii  250 mg Oral BID  . tamsulosin  0.4 mg Oral QHS   Continuous Infusions:  PRN Meds:.acetaminophen **OR** acetaminophen, naLOXone (NARCAN)  injection, ondansetron **OR** ondansetron (ZOFRAN) IV, polyethylene glycol  Antibiotics    Anti-infectives    Start     Dose/Rate Route Frequency Ordered Stop   10/18/15 1800  piperacillin-tazobactam (ZOSYN) IVPB 3.375 g     3.375 g 12.5 mL/hr over 240 Minutes Intravenous Every 8 hours 10/18/15 1331     10/18/15 1030  piperacillin-tazobactam (ZOSYN) IVPB 3.375 g     3.375 g 12.5 mL/hr over 240 Minutes Intravenous  Once 10/18/15 1028 10/18/15 1454     Subjective:  [name] [name] seen and examined today.  Patient states she needs her Ativan for her nerves. She feels some pain as well and her legs. Patient denies any chest pain, shortness of breath, abdominal pain. Understands she is in the hospital  Objective:   Filed Vitals:   10/18/15 1426 10/18/15 2012 10/19/15 0033 10/19/15 0432  BP: 132/63 116/58 114/54 110/45  Pulse: 91 83 71 77  Temp: 98.1 F (36.7 C) 98.7 F (37.1 C) 98.3 F (36.8 C) 98.4 F (36.9 C)  TempSrc: Oral Oral Axillary Oral  Resp: 18 18 18 19   Height:      Weight:      SpO2: 95% 99% 100% 99%    Wt Readings from Last 3 Encounters:  10/18/15 90.266 kg (199 lb)  10/12/15 90.7 kg (199 lb 15.3 oz)  08/20/15 90.6 kg (199 lb 11.8 oz)     Intake/Output Summary (Last 24 hours) at 10/19/15 1346 Last data  filed at 10/19/15 0830  Gross per 24 hour  Intake    240 ml  Output      0 ml  Net    240 ml    Exam  General: Well developed, well nourished, NAD, appears stated age  HEENT: NCAT, mucous membranes moist.   Cardiovascular: S1 S2 auscultated, RRR  Respiratory: Clear to auscultation bilaterally   Abdomen: Soft, nontender, nondistended, + bowel sounds  Extremities: warm dry without cyanosis clubbing or edema  Neuro: AAOx3, nonfocal, tremor  Psych: Anxious  Data Review   Micro Results Recent Results (from the past 240 hour(s))  Urine culture     Status: None   Collection Time: 10/10/15  9:53 PM  Result Value Ref Range Status   Specimen Description URINE, CATHETERIZED  Final   Special Requests NONE  Final   Culture   Final    NO GROWTH 1 DAY Performed at [facility]    Report Status 10/12/2015 FINAL  Final  Culture, blood (x 2)     Status: None   Collection Time: 10/11/15  2:07 AM  Result Value Ref Range Status   Specimen Description BLOOD LEFT ANTECUBITAL  Final   Special Requests BOTTLES DRAWN AEROBIC AND ANAEROBIC 5CC  Final   Culture   Final    NO GROWTH 5 DAYS Performed at [facility]    Report Status 10/16/2015 FINAL  Final  Culture, blood (x 2)     Status: Abnormal   Collection Time: 10/11/15  2:09 AM  Result Value Ref Range Status   Specimen Description BLOOD RIGHT ANTECUBITAL  Final   Special Requests BOTTLES DRAWN AEROBIC AND ANAEROBIC 5CC  Final   Culture  Setup Time   Final    GRAM POSITIVE RODS AEROBIC BOTTLE ONLY CRITICAL RESULT CALLED TO, READ BACK BY AND VERIFIED WITH: S RIZWAN,MD AT 1053 10/15/15 BY L BENFIELD    Culture (A)  Final    DIPHTHEROIDS(CORYNEBACTERIUM SPECIES) Standardized susceptibility testing for this organism is not available. Performed at [facility]    Report Status 10/16/2015 FINAL  Final  Respiratory virus panel     Status: None   Collection Time: 10/11/15  2:54 AM  Result Value Ref Range Status    Respiratory Syncytial Virus A Negative Negative Final   Respiratory Syncytial Virus B Negative Negative Final   Influenza A Negative Negative Final   Influenza B Negative Negative Final   Parainfluenza 1 Negative Negative Final   Parainfluenza 2 Negative Negative Final   Parainfluenza 3 Negative Negative  Final   Metapneumovirus Negative Negative Final   Rhinovirus Negative Negative Final   Adenovirus Negative Negative Final    Comment: (NOTE) Performed At: [facility] [address] [place], [place] [id number] Mila Homer MD JY:[id number]   Urine culture     Status: Abnormal   Collection Time: 10/18/15 11:00 AM  Result Value Ref Range Status   Specimen Description URINE, CATHETERIZED  Final   Special Requests NONE  Final   Culture (A)  Final    3,000 COLONIES/mL INSIGNIFICANT GROWTH Performed at [facility]    Report Status 10/19/2015 FINAL  Final  Blood Culture (routine x 2)     Status: None (Preliminary result)   Collection Time: 10/18/15  2:04 PM  Result Value Ref Range Status   Specimen Description BLOOD LEFT HAND  Final   Special Requests   Final    IN PEDIATRIC BOTTLE 3 CC Performed at [facility]    Culture PENDING  Incomplete   Report Status PENDING  Incomplete    Radiology Reports Dg Chest 2 View  10/10/2015  CLINICAL DATA:  Dyspnea, onset early this morning and worsening throughout today. EXAM: CHEST  2 VIEW COMPARISON:  08/18/2015 FINDINGS: There is unchanged mild cardiomegaly. The lungs are clear. There is no pleural effusion. The pulmonary vasculature is normal. IMPRESSION: No active cardiopulmonary disease. Electronically Signed   By: Ellery Plunk M.D.   On: 10/10/2015 22:03   Dg Abd 1 View  10/10/2015  CLINICAL DATA:  Diffuse abdominal pain and distention, onset today. EXAM: ABDOMEN - 1 VIEW COMPARISON:  CT 08/09/2015 FINDINGS: The bowel gas pattern is normal. Moderate stool burden. Enteric contrast within diverticula in the  left colon. Oblong densities in the colon are likely related to pills. Portions of the right abdomen excluded from the field of view. No evidence of free air on supine view. Surgical clips in the right abdomen. There is degenerative change in the spine. IMPRESSION: Normal bowel gas pattern.  Moderate stool burden. Electronically Signed   By: Rubye Oaks M.D.   On: 10/10/2015 23:14   Dg Chest Port 1 View  10/18/2015  CLINICAL DATA:  Fever and confusion. EXAM: PORTABLE CHEST 1 VIEW COMPARISON:  10/10/2015 FINDINGS: The patient is slightly rotated to the right. The cardiac silhouette remains mildly enlarged. No airspace consolidation, edema, pleural effusion, or pneumothorax is identified. No acute osseous abnormality is seen. Prior cervical spine fusion is partially visualized. IMPRESSION: No active disease. Electronically Signed   By: Sebastian Ache M.D.   On: 10/18/2015 10:35   Dg Abd 2 Views  10/18/2015  CLINICAL DATA:  Abdominal pain and distension today. EXAM: ABDOMEN - 2 VIEW COMPARISON:  10/10/2015 FINDINGS: Normal bowel gas pattern. No free air. Moderate colonic stool burden, somewhat increased when compared to the recent prior exam. Surgical vascular clips in right lower quadrant are stable. No convincing renal or ureteral stones. IMPRESSION: No acute finding. No evidence of bowel obstruction. Moderate colonic stool burden mildly increased from the prior study. Electronically Signed   By: Amie Portland M.D.   On: 10/18/2015 13:50    CBC  Recent Labs Lab 10/18/15 1038 10/18/15 1636 10/19/15 0514  WBC 13.9* QUESTIONABLE RESULTS, RECOMMEND RECOLLECT TO VERIFY 11.7*  HGB 14.4 QUESTIONABLE RESULTS, RECOMMEND RECOLLECT TO VERIFY 14.6  HCT 44.9 QUESTIONABLE RESULTS, RECOMMEND RECOLLECT TO VERIFY 47.1*  PLT 272 QUESTIONABLE RESULTS, RECOMMEND RECOLLECT TO VERIFY 192  MCV 84.7 QUESTIONABLE RESULTS, RECOMMEND RECOLLECT TO VERIFY 88.5  MCH 27.2  QUESTIONABLE RESULTS, RECOMMEND RECOLLECT TO VERIFY  27.4  MCHC 32.1 QUESTIONABLE RESULTS, RECOMMEND RECOLLECT TO VERIFY 31.0  RDW 14.9 QUESTIONABLE RESULTS, RECOMMEND RECOLLECT TO VERIFY 15.5  LYMPHSABS 3.1 QUESTIONABLE RESULTS, RECOMMEND RECOLLECT TO VERIFY  --   MONOABS 0.9 QUESTIONABLE RESULTS, RECOMMEND RECOLLECT TO VERIFY  --   EOSABS 0.2 QUESTIONABLE RESULTS, RECOMMEND RECOLLECT TO VERIFY  --   BASOSABS 0.1 QUESTIONABLE RESULTS, RECOMMEND RECOLLECT TO VERIFY  --     Chemistries   Recent Labs Lab 10/18/15 1038 10/18/15 2237 10/19/15 0514  NA 136 141 139  K 3.9 3.8 5.1  CL 91* 103 102  CO2 33* 31 29  GLUCOSE 187* 216* 184*  BUN CREATININE 0.56 0.46 0.43*  CALCIUM 8.9 8.1* 8.3*  AST 22 19  --   ALT 29 24  --   ALKPHOS 61 50  --   BILITOT 0.7 0.6  --    ------------------------------------------------------------------------------------------------------------------ estimated creatinine clearance is 66 mL/min (by C-G formula based on Cr of 0.43). ------------------------------------------------------------------------------------------------------------------  Recent Labs  10/18/15 1406  HGBA1C 9.7*   ------------------------------------------------------------------------------------------------------------------ No results for input(s): CHOL, HDL, LDLCALC, TRIG, CHOLHDL, LDLDIRECT in the last 72 hours. ------------------------------------------------------------------------------------------------------------------ No results for input(s): TSH, T4TOTAL, T3FREE, THYROIDAB in the last 72 hours.  Invalid input(s): FREET3 ------------------------------------------------------------------------------------------------------------------ No results for input(s): VITAMINB12, FOLATE, FERRITIN, TIBC, IRON, RETICCTPCT in the last 72 hours.  Coagulation profile  Recent Labs Lab 10/18/15 1404 10/18/15 1636  INR 1.72* QUESTIONABLE RESULTS, RECOMMEND RECOLLECT TO VERIFY    No results for input(s): DDIMER in the  last 72 hours.  Cardiac Enzymes No results for input(s): CKMB, TROPONINI, MYOGLOBIN in the last 168 hours.  Invalid input(s): CK ------------------------------------------------------------------------------------------------------------------ Invalid input(s): POCBNP    [name] [name] D.O. on 10/19/2015 at 1:46 PM  Between 7am to 7pm - Pager - [phone]  After 7pm go to [email or web address] - password [email or web address]  And look for the night coverage person covering for me after hours  [facility] Office  [phone]

## 2015-10-20 DIAGNOSIS — E662 Morbid (severe) obesity with alveolar hypoventilation: Secondary | ICD-10-CM

## 2015-10-20 LAB — GLUCOSE, CAPILLARY
GLUCOSE-CAPILLARY: 133 mg/dL — AB (ref 65–99)
GLUCOSE-CAPILLARY: 143 mg/dL — AB (ref 65–99)
GLUCOSE-CAPILLARY: 234 mg/dL — AB (ref 65–99)
Glucose-Capillary: 133 mg/dL — ABNORMAL HIGH (ref 65–99)

## 2015-10-20 LAB — BASIC METABOLIC PANEL
Anion gap: 12 (ref 5–15)
BUN: 7 mg/dL (ref 6–20)
CHLORIDE: 100 mmol/L — AB (ref 101–111)
CO2: 28 mmol/L (ref 22–32)
CREATININE: 0.52 mg/dL (ref 0.44–1.00)
Calcium: 8.5 mg/dL — ABNORMAL LOW (ref 8.9–10.3)
GFR calc Af Amer: >60 mL/min (ref >60)
Glucose, Bld: 150 mg/dL — ABNORMAL HIGH (ref 65–99)
POTASSIUM: 4.1 mmol/L (ref 3.5–5.1)
Sodium: 140 mmol/L (ref 135–145)

## 2015-10-20 LAB — CBC
HEMATOCRIT: 41.7 % (ref 36.0–46.0)
HEMOGLOBIN: 13 g/dL (ref 12.0–15.0)
MCH: 27.6 pg (ref 26.0–34.0)
MCHC: 31.2 g/dL (ref 30.0–36.0)
MCV: 88.5 fL (ref 78.0–100.0)
Platelets: 222 10*3/uL (ref 150–400)
RBC: 4.71 MIL/uL (ref 3.87–5.11)
RDW: 15.4 % (ref 11.5–15.5)
WBC: 9.1 10*3/uL (ref 4.0–10.5)

## 2015-10-20 MED ORDER — LORAZEPAM 1 MG PO TABS: 0.5000 mg | ORAL_TABLET | Freq: Two times a day (BID) | ORAL | Status: AC | PRN

## 2015-10-20 MED ORDER — OXYCODONE HCL 20 MG PO TABS: 0.5000 | ORAL_TABLET | Freq: Two times a day (BID) | ORAL | Status: AC | PRN

## 2015-10-20 MED ORDER — FOSFOMYCIN TROMETHAMINE 3 G PO PACK
3.0000 g | PACK | Freq: Once | ORAL | Status: AC
  Administered 2015-10-20: 3 g via ORAL
  Filled 2015-10-20: qty 3

## 2015-10-20 NOTE — Progress Notes (Signed)
PT Cancellation Note / Screen  Patient Details Name: Ninfa MeekerLinda Lapidus MRN: [id number] DOB: 03-May-1946   Cancelled Treatment:    Reason Eval/Treat Not Completed: PT screened, no needs identified, will sign off Pt is total assist with 24/7 care and requires hoyer lift for OOB to chair at baseline. Recommend OOB to chair with lift equipment with nursing if/when tolerated. PT to sign off.    Hesham Womac,KATHrine E 10/20/2015, 11:55 AM Zenovia JarredKati Kilynn Fitzsimmons, PT, DPT 10/20/2015 Pager: [phone]

## 2015-10-20 NOTE — Care Management Important Message (Signed)
Important Message  Patient Details  Name: Ninfa MeekerLinda Barge MRN: [id number] Date of Birth: [birth year]   Medicare Important Message Given:  Yes    Elliot CousinShavis, Emmy Keng Ellen, RN 10/20/2015, 1:18 PM

## 2015-10-20 NOTE — Evaluation (Signed)
Occupational Therapy Evaluation Patient Details Name: [name] [name] MRN: [id number] DOB: [birth year] Today's Date: 10/20/2015    History of Present Illness 70 yo female admitted with AMS, respiratory failure. H/O recurrent UTI, respiratory distress. H/O quadriparesis of longstanding. Has 24/7 caregu=ivers, Hoyer lift, total care.   Clinical Impression   Patient evaluated by Occupational Therapy with no further acute OT needs identified. All education has been completed and the patient has no further questions. Pt is at her baseline level of functioning.  She requires total A for ADLs and has 24 hour PCAs.  She has all needed DME.  See below for any follow-up Occupational Therapy or equipment needs. OT is signing off. Thank you for this referral.      Follow Up Recommendations  No OT follow up;Supervision/Assistance - 24 hour    Equipment Recommendations  None recommended by OT    Recommendations for Other Services       Precautions / Restrictions        Mobility Bed Mobility Overal bed mobility: Needs Assistance;+2 for physical assistance Bed Mobility: Rolling Rolling: Total assist         General bed mobility comments: Able to assist minimally with by grabbing rail and turning UB with assist   Transfers                 General transfer comment: Pt requires hoyer transfer     Balance                                            ADL                                         General ADL Comments: Pt incontinent of large stool. Assisted with peri care.  She requires total A for rolling (is able to assist some), and total assist for peri care.  She is at baseline with ADLs      Vision     Perception     Praxis      Pertinent Vitals/Pain       Hand Dominance Right   Extremity/Trunk Assessment Upper Extremity Assessment Upper Extremity Assessment: Generalized weakness;LUE deficits/detail;RUE deficits/detail RUE  Deficits / Details: long standing weakneess.  Arthritic deformity bil. hands  LUE Deficits / Details: long standing weakneess.  Arthritic deformity bil. hands   Lower Extremity Assessment Lower Extremity Assessment: LLE deficits/detail;RLE deficits/detail RLE Deficits / Details: Pt with quadraplegia  LLE Deficits / Details: Pt with quadraplegia        Communication Communication Communication: No difficulties   Cognition                           General Comments       Exercises       Shoulder Instructions      Home Living Family/patient expects to be discharged to:: Private residence Living Arrangements: Children Available Help at Discharge: Available 24 hours/day;Personal care attendant Type of Home: House Home Access: Ramped entrance     Home Layout: One level     Bathroom Shower/Tub: Other (comment)         Home Equipment: Wheelchair - manual;Hospital bed;Bedside commode   Additional Comments: has Product/process development scientist      Prior Functioning/Environment  Level of Independence: Needs assistance  Gait / Transfers Assistance Needed: Michiel SitesHoyer to manual WC, unable to sit on EOB due to poor balance; aides perform LE ROM and UE AAROM exercises with pt daily ADL's / Homemaking Assistance Needed: able to feed self finger food, assist for bathing/dressing, pt can assist with UE ADLs        OT Diagnosis: Paresis;Generalized weakness   OT Problem List: Decreased strength;Decreased activity tolerance;Impaired balance (sitting and/or standing);Decreased coordination;Impaired sensation;Impaired UE functional use;Obesity   OT Treatment/Interventions:      OT Goals(Current goals can be found in the care plan section) Acute Rehab OT Goals Patient Stated Goal: to go home  OT Goal Formulation: All assessment and education complete, DC therapy  OT Frequency:     Barriers to D/C:            Co-evaluation              End of Session Nurse Communication: Mobility  status  Activity Tolerance: Patient tolerated treatment well Patient left: in bed;with call bell/phone within reach   Time: 1037-1056 OT Time Calculation (min): 19 min Charges:  OT General Charges $OT Visit: 1 Procedure OT Evaluation $OT Eval Moderate Complexity: 1 Procedure G-Codes:    Jeani HawkingConarpe, [name] 10/20/2015, 5:41 PM

## 2015-10-20 NOTE — Discharge Instructions (Signed)

## 2015-10-20 NOTE — Progress Notes (Signed)
Went over d/c instructions with patient and her son.  Verbalized understanding.  Patient left hospital via EMS with all personal belongings.

## 2015-10-20 NOTE — Care Management Note (Addendum)
Case Management Note  Patient Details  Name: [name] MeekerLinda [name] MRN: [id number] Date of Birth: 07-Jan-1946  Subjective/Objective:   Acute encephalopathy, UTI                 Action/Plan: Discharge Planning:   NCM spoke to pt at bedside. Contacted son, [name] FavorsJim [name]. States pt has [facility] 24 hour caregiver, [facility]. States HH RN will have to come and do an assessment today when pt arrives. Pt lives at home with son. Son states he wants PTAR to transport home. He will contact [facility] to ensure aide can come today. Wants Unit RN to call him with dc instructions. Contacted [facility] Liaison, [name] RuskJeremy # [phone] or [phone]. States he is working on Investment banker, corporatearranging HH RN assessment and aide to come out today. Will fax HH orders and dc summary to [facility]. [facility] Pt has hospital bed at home.   1400 NCM spoke to son, and [name] ShoreMaxim will come out at 3:30 pm today. Message from [place] Springs[place]remy to fax orders to [facility], # [phone]. Faxed orders, dc summary and HP. Unit RN will access pt's need for oxygen at home. NCM arranged PTAR.   Expected Discharge Date:     10/20/2015       Expected Discharge Plan:  Home w Home Health Services  In-House Referral:  NA  Discharge planning Services  CM Consult  Post Acute Care Choice:  Home Health, Resumption of Svcs/PTA Provider Choice offered to:  Patient, Adult Children  DME Arranged:  N/A DME Agency:  NA  HH Arranged:  RN, Nurse's Aide HH Agency:  [facility]  Status of Service:  Completed, signed off  Medicare Important Message Given:  yes Date Medicare IM Given:    Medicare IM give by:    Date Additional Medicare IM Given:    Additional Medicare Important Message give by:     If discussed at Long Length of Stay Meetings, dates discussed:    Additional Comments:  [name] CousinShavis, [name] Fatheree Ellen, RN 10/20/2015, 1:00 PM

## 2015-10-20 NOTE — Discharge Summary (Signed)
Physician Discharge Summary  [name] [name] ZOX:[id number] DOB: [birth year] DOA: 10/18/2015  PCP: [name], MD  Admit date: 10/18/2015 Discharge date: 10/20/2015  Time spent: 45 minutes  Recommendations for Outpatient Follow-up:  Patient will be discharged to home with home health RN.  Patient will need to follow up with primary care provider within one week of discharge.  Patient should continue medications as prescribed.  Patient should follow a heart health/carb modified diet.   Discharge Diagnoses:  Sepsis secondary to UTI Acute encephalopathy Chronic atrial fibrillation Chronic diastolic heart failure Abdominal distention Diabetes mellitus, type II  Discharge Condition: Stable  Diet recommendation: Heart healthy/carb modified  Filed Weights   10/18/15 1105  Weight: 90.266 kg (199 lb)    History of present illness:  on 10/18/2015 by Dr. Rosine Beat [name] [name] is a 70 y.o. female past medical history of hypertension, diabetes mellitus last hemoglobin A1c of 9.3, obstructive sleep apnea chronic diastolic heart failure atrial fibrillation on Eliquis presents to the ED with abdominal pain and confusion started on the day of admission. She had a mild temperature of 101 in the ED. He cannot provide a lot of history as she is mildly confused. And tangential on her answers. She denies any dysuria burning when she urinates, fever or cough. In the ED: Her temperature was 101, she has mild leukocytosis with mild elevation of lactic acid and she has been fluid resuscitated in the ED.  Hospital Course:  Sepsis secondary to UTI -Upon admission, patient was tachycardic, febrile with leukocytosis -Leukocytosis resolved, VSS resolved -UA showed TNTC WBC, large leukocytes, many bacteria -Was placed on Zosyn -Given dose a fosphomycin as patient has had ESBL Ecoli in the past -Urine culture: 3000 colonies insignificant growth -blood cultures show no growth to date  Acute  encephalopathy -Secondary to sepsis versus oversedation with medications -Patient does have multiple medications, patient's follow-up with primary care physician to review all the medication she is currently taking. -Resolved -Gabapentin, baclofen held. Lorazepam made when necessary  Chronic Atrial fibrillation -CHADSVASC 5 -Continue Eliquis and propranolol  Chronic diastolic heart failure -Echocardiogram 08/13/2015 showed EF of 65-70% -Continue Demadex, potassium supplementation  Abdominal distention -Abdominal x-ray showed no acute finding, no evidence of bowel obstruction, moderate colonic stool burden  Diabetes mellitus, type II -Continue insulin sliding scale CBG monitoring  Procedures  None  Consults  None   Discharge Exam: Filed Vitals:   10/19/15 2342 10/20/15 0620  BP: 100/44 123/63  Pulse:  85  Temp: 98.5 F (36.9 C) 99.3 F (37.4 C)  Resp: 18 20   Exam  General: Well developed, well nourished, NAD  HEENT: NCAT, mucous membranes moist.   Cardiovascular: S1 S2 auscultated, RRR  Respiratory: Clear to auscultation bilaterally  Abdomen: Soft, nontender, nondistended, + bowel sounds  Extremities: warm dry without cyanosis clubbing or edema  Neuro: AAOx3, nonfocal  Psych: Appropriate mood and affect  Discharge Instructions      Discharge Instructions    Discharge instructions    Complete by:  As directed   Patient will be discharged to home with home health RN.  Patient will need to follow up with primary care provider within one week of discharge.  Patient should continue medications as prescribed.  Patient should follow a heart health/carb modified die            Medication List    STOP taking these medications        baclofen 10 MG tablet  Commonly known as:  LIORESAL  gabapentin 300 MG capsule  Commonly known as:  NEURONTIN     rOPINIRole 0.5 MG tablet  Commonly known as:  REQUIP      TAKE these medications         acetaminophen 325 MG tablet  Commonly known as:  TYLENOL  Take 2 tablets (650 mg total) by mouth every 6 (six) hours as needed for mild pain (or Fever >/= 101).     ADVIL COLD/SINUS PO  Take 1 tablet by mouth 2 (two) times daily as needed (cold symptoms).     amLODipine 5 MG tablet  Commonly known as:  NORVASC  Take 1 tablet (5 mg total) by mouth daily.     apixaban 5 MG Tabs tablet  Commonly known as:  ELIQUIS  Take 1 tablet (5 mg total) by mouth 2 (two) times daily.     calcium-vitamin D 500-200 MG-UNIT tablet  Commonly known as:  OSCAL WITH D  Take 1 tablet by mouth daily with breakfast.     [employer] SPECTRAVITE ADULT 50+ PO  Take 1 tablet by mouth daily.     glimepiride 2 MG tablet  Commonly known as:  AMARYL  Take 1 tablet (2 mg total) by mouth daily with breakfast.     LORazepam 1 MG tablet  Commonly known as:  ATIVAN  Take 0.5 tablets (0.5 mg total) by mouth every 12 (twelve) hours as needed for anxiety.     magnesium oxide 400 MG tablet  Commonly known as:  MAG-OX  Take 400 mg by mouth 2 (two) times daily.     omeprazole 20 MG capsule  Commonly known as:  PRILOSEC  Take 2 capsules (40 mg total) by mouth daily.     Oxycodone HCl 20 MG Tabs  Take 0.5 tablets (10 mg total) by mouth 2 (two) times daily as needed.     potassium chloride SA 20 MEQ tablet  Commonly known as:  K-DUR,KLOR-CON  Take 1 tablet (20 mEq total) by mouth daily.     predniSONE 5 MG tablet  Commonly known as:  DELTASONE  Take 3 tablets (15 mg total) by mouth daily with breakfast. Resume after taper.     propranolol 10 MG tablet  Commonly known as:  INDERAL  Take 3 tablets (30 mg total) by mouth 3 (three) times daily.     rosuvastatin 10 MG tablet  Commonly known as:  CRESTOR  Take 10 mg by mouth daily.     saccharomyces boulardii 250 MG capsule  Commonly known as:  FLORASTOR  Take 1 capsule (250 mg total) by mouth 2 (two) times daily.     tamsulosin 0.4 MG Caps capsule  Commonly known  as:  FLOMAX  Take 0.4 mg by mouth at bedtime.     torsemide 20 MG tablet  Commonly known as:  DEMADEX  Take 1 tablet (20 mg total) by mouth daily.     traZODone 50 MG tablet  Commonly known as:  DESYREL  Take 50 mg by mouth at bedtime.     trifluoperazine 5 MG tablet  Commonly known as:  STELAZINE  Take 5 mg by mouth 2 (two) times daily as needed (for itching).       Allergies  Allergen Reactions  . Decadrol [Dexamethasone] Swelling    Swelling of face and stomach   . Sodium Phosphate Swelling    Swelling of face and stomach    Follow-up Information    Follow up with [name], Samuel, MD. Schedule an appointment  as soon as possible for a visit in 1 week.   Specialty:  Family Medicine   Why:  Hospital follow-up   Contact information:   [address] [place] [address] [phone]        The results of significant diagnostics from this hospitalization (including imaging, microbiology, ancillary and laboratory) are listed below for reference.    Significant Diagnostic Studies: Dg Chest 2 View  10/10/2015  CLINICAL DATA:  Dyspnea, onset early this morning and worsening throughout today. EXAM: CHEST  2 VIEW COMPARISON:  08/18/2015 FINDINGS: There is unchanged mild cardiomegaly. The lungs are clear. There is no pleural effusion. The pulmonary vasculature is normal. IMPRESSION: No active cardiopulmonary disease. Electronically Signed   By: Ellery Plunk M.D.   On: 10/10/2015 22:03   Dg Abd 1 View  10/10/2015  CLINICAL DATA:  Diffuse abdominal pain and distention, onset today. EXAM: ABDOMEN - 1 VIEW COMPARISON:  CT 08/09/2015 FINDINGS: The bowel gas pattern is normal. Moderate stool burden. Enteric contrast within diverticula in the left colon. Oblong densities in the colon are likely related to pills. Portions of the right abdomen excluded from the field of view. No evidence of free air on supine view. Surgical clips in the right abdomen. There is degenerative change in the spine.  IMPRESSION: Normal bowel gas pattern.  Moderate stool burden. Electronically Signed   By: Rubye Oaks M.D.   On: 10/10/2015 23:14   Dg Chest Port 1 View  10/18/2015  CLINICAL DATA:  Fever and confusion. EXAM: PORTABLE CHEST 1 VIEW COMPARISON:  10/10/2015 FINDINGS: The patient is slightly rotated to the right. The cardiac silhouette remains mildly enlarged. No airspace consolidation, edema, pleural effusion, or pneumothorax is identified. No acute osseous abnormality is seen. Prior cervical spine fusion is partially visualized. IMPRESSION: No active disease. Electronically Signed   By: Sebastian Ache M.D.   On: 10/18/2015 10:35   Dg Abd 2 Views  10/18/2015  CLINICAL DATA:  Abdominal pain and distension today. EXAM: ABDOMEN - 2 VIEW COMPARISON:  10/10/2015 FINDINGS: Normal bowel gas pattern. No free air. Moderate colonic stool burden, somewhat increased when compared to the recent prior exam. Surgical vascular clips in right lower quadrant are stable. No convincing renal or ureteral stones. IMPRESSION: No acute finding. No evidence of bowel obstruction. Moderate colonic stool burden mildly increased from the prior study. Electronically Signed   By: Amie Portland M.D.   On: 10/18/2015 13:50    Microbiology: Recent Results (from the past 240 hour(s))  Urine culture     Status: None   Collection Time: 10/10/15  9:53 PM  Result Value Ref Range Status   Specimen Description URINE, CATHETERIZED  Final   Special Requests NONE  Final   Culture   Final    NO GROWTH 1 DAY Performed at [facility]    Report Status 10/12/2015 FINAL  Final  Culture, blood (x 2)     Status: None   Collection Time: 10/11/15  2:07 AM  Result Value Ref Range Status   Specimen Description BLOOD LEFT ANTECUBITAL  Final   Special Requests BOTTLES DRAWN AEROBIC AND ANAEROBIC 5CC  Final   Culture   Final    NO GROWTH 5 DAYS Performed at [facility]    Report Status 10/16/2015 FINAL  Final  Culture, blood (x  2)     Status: Abnormal   Collection Time: 10/11/15  2:09 AM  Result Value Ref Range Status   Specimen Description BLOOD RIGHT  ANTECUBITAL  Final   Special Requests BOTTLES DRAWN AEROBIC AND ANAEROBIC 5CC  Final   Culture  Setup Time   Final    GRAM POSITIVE RODS AEROBIC BOTTLE ONLY CRITICAL RESULT CALLED TO, READ BACK BY AND VERIFIED WITH: S RIZWAN,MD AT 1053 10/15/15 BY L BENFIELD    Culture (A)  Final    DIPHTHEROIDS(CORYNEBACTERIUM SPECIES) Standardized susceptibility testing for this organism is not available. Performed at [facility]    Report Status 10/16/2015 FINAL  Final  Respiratory virus panel     Status: None   Collection Time: 10/11/15  2:54 AM  Result Value Ref Range Status   Respiratory Syncytial Virus A Negative Negative Final   Respiratory Syncytial Virus B Negative Negative Final   Influenza A Negative Negative Final   Influenza B Negative Negative Final   Parainfluenza 1 Negative Negative Final   Parainfluenza 2 Negative Negative Final   Parainfluenza 3 Negative Negative Final   Metapneumovirus Negative Negative Final   Rhinovirus Negative Negative Final   Adenovirus Negative Negative Final    Comment: (NOTE) Performed At: [facility] [address] [place], [place] [id number] Mila Homer MD WU:[id number]   Blood Culture (routine x 2)     Status: None (Preliminary result)   Collection Time: 10/18/15 10:37 AM  Result Value Ref Range Status   Specimen Description BLOOD RIGHT ANTECUBITAL  Final   Special Requests BOTTLES DRAWN AEROBIC AND ANAEROBIC  Final   Culture   Final    NO GROWTH 1 DAY Performed at [facility]    Report Status PENDING  Incomplete  Urine culture     Status: Abnormal   Collection Time: 10/18/15 11:00 AM  Result Value Ref Range Status   Specimen Description URINE, CATHETERIZED  Final   Special Requests NONE  Final   Culture (A)  Final    3,000 COLONIES/mL INSIGNIFICANT GROWTH Performed at [facility]    Report Status 10/19/2015 FINAL  Final  Blood Culture (routine x 2)     Status: None (Preliminary result)   Collection Time: 10/18/15  2:04 PM  Result Value Ref Range Status   Specimen Description BLOOD LEFT HAND  Final   Special Requests IN PEDIATRIC BOTTLE 3 CC  Final   Culture   Final    NO GROWTH < 24 HOURS Performed at [facility]    Report Status PENDING  Incomplete  Culture, blood (x 2)     Status: None (Preliminary result)   Collection Time: 10/18/15  2:05 PM  Result Value Ref Range Status   Specimen Description BLOOD RIGHT ARM  Final   Special Requests BOTTLES DRAWN AEROBIC ONLY 5 CC BLUE  Final   Culture   Final    NO GROWTH < 24 HOURS Performed at [facility]    Report Status PENDING  Incomplete     Labs: Basic Metabolic Panel:  Recent Labs Lab 10/18/15 1038 10/18/15 2237 10/19/15 0514 10/20/15 0536  NA 136 141 139 140  K 3.9 3.8 5.1 4.1  CL 91* 103 102 100*  CO2 33* 31 29 28   GLUCOSE 187* 216* 184* 150*  BUN 10 9 9 7   CREATININE 0.56 0.46 0.43* 0.52  CALCIUM 8.9 8.1* 8.3* 8.5*   Liver Function Tests:  Recent Labs Lab 10/18/15 1038 10/18/15 2237  AST 22 19  ALT 29 24  ALKPHOS 61 50  BILITOT 0.7 0.6  PROT 6.3* 5.5*  ALBUMIN 3.5 3.0*  No results for input(s): LIPASE, AMYLASE in the last 168 hours. No results for input(s): AMMONIA in the last 168 hours. CBC:  Recent Labs Lab 10/18/15 1038 10/18/15 1636 10/19/15 0514 10/20/15 0536  WBC 13.9* QUESTIONABLE RESULTS, RECOMMEND RECOLLECT TO VERIFY 11.7* 9.1  NEUTROABS 9.7* QUESTIONABLE RESULTS, RECOMMEND RECOLLECT TO VERIFY  --   --   HGB 14.4 QUESTIONABLE RESULTS, RECOMMEND RECOLLECT TO VERIFY 14.6 13.0  HCT 44.9 QUESTIONABLE RESULTS, RECOMMEND RECOLLECT TO VERIFY 47.1* 41.7  MCV 84.7 QUESTIONABLE RESULTS, RECOMMEND RECOLLECT TO VERIFY 88.5 88.5  PLT 272 QUESTIONABLE RESULTS, RECOMMEND RECOLLECT TO VERIFY 192 222   Cardiac Enzymes: No results for input(s):  CKTOTAL, CKMB, CKMBINDEX, TROPONINI in the last 168 hours. BNP: BNP (last 3 results)  Recent Labs  08/09/15 1050 08/18/15 1035 10/10/15 2214  BNP 130.8* 166.4* 72.9    ProBNP (last 3 results) No results for input(s): PROBNP in the last 8760 hours.  CBG:  Recent Labs Lab 10/19/15 2004 10/20/15 0005 10/20/15 0400 10/20/15 0753 10/20/15 1130  GLUCAP 219* 143* 133* 133* 234*       Signed:  Andera Cranmer  [facility] 10/20/2015, 1:14 PM

## 2015-10-23 LAB — CULTURE, BLOOD (ROUTINE X 2)
CULTURE: NO GROWTH
CULTURE: NO GROWTH
Culture: NO GROWTH

## 2015-12-06 DEATH — deceased

## 2018-01-11 IMAGING — CT CT ANGIO CHEST
1 of 2 series · 18 of 32 positions shown · IV contrast (OMNIPAQUE)
Comparison: 04/07/2015

CLINICAL DATA: Hypoxia and possible aspiration. Evaluate for
pulmonary embolism. Patient is quadriplegic.

EXAM:
CT ANGIOGRAPHY CHEST WITH CONTRAST
TECHNIQUE: Multidetector CT imaging of the chest was performed using the
standard protocol during bolus administration of intravenous
contrast. Multiplanar CT image reconstructions and MIPs were
obtained to evaluate the vascular anatomy.
CONTRAST:  100mL OMNIPAQUE IOHEXOL 350 MG/ML SOLN

[Series 6: thins · axial · 0.79mm/px · z∈[-324,-52]mm · 18 of 303 slices shown]
[im 16/303  lung]
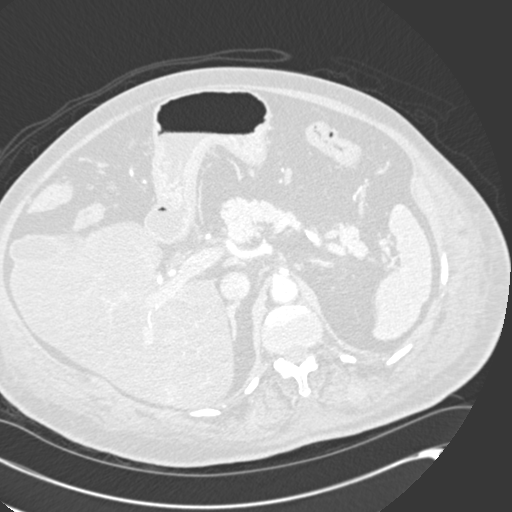
[im 31/303  mediastinal]
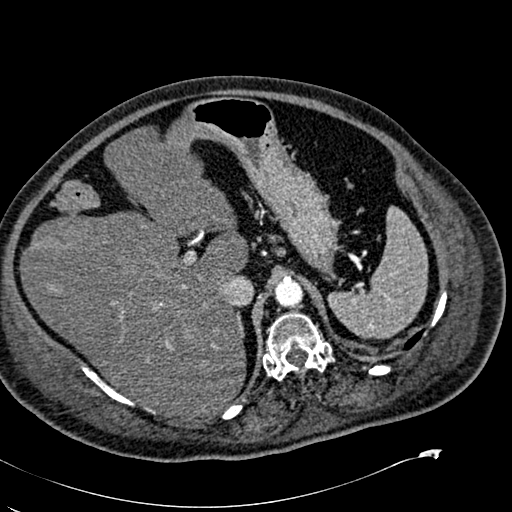
[im 61/303  lung]
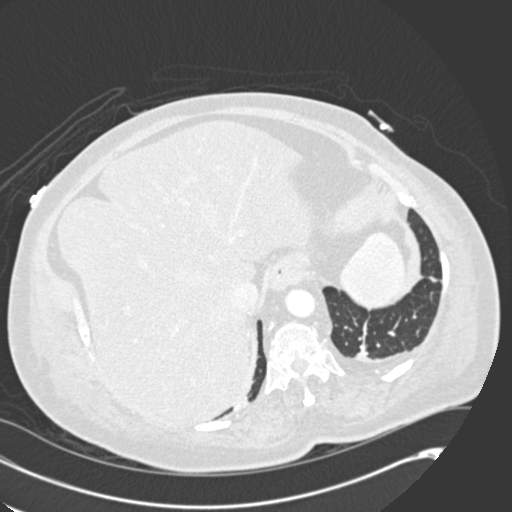
[im 76/303  mediastinal]
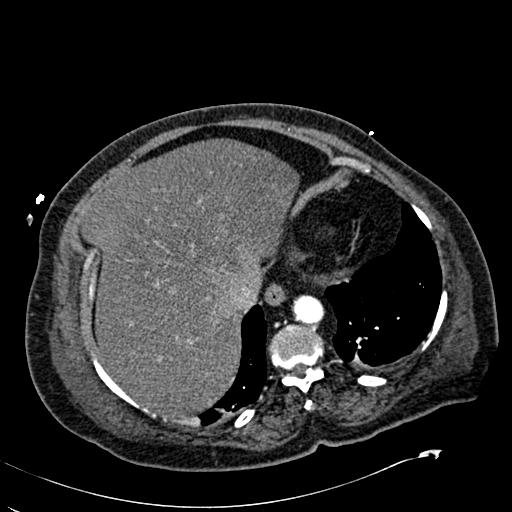
[im 91/303  lung]
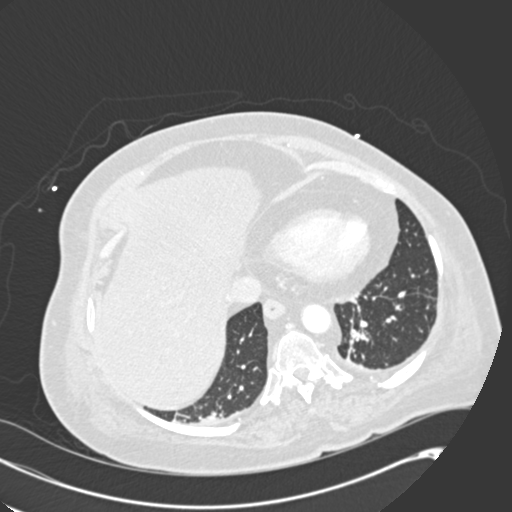
[im 101/303  mediastinal]
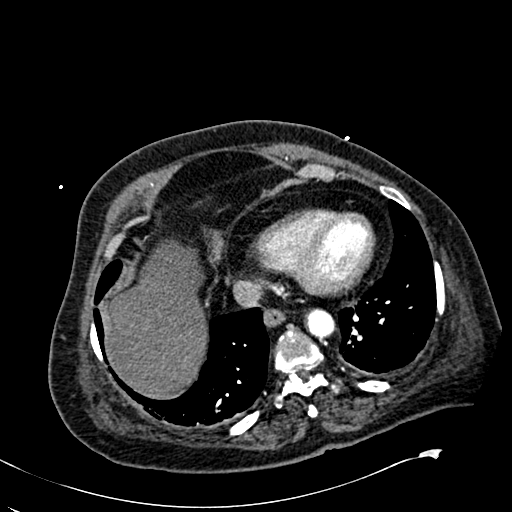
[im 106/303  lung]
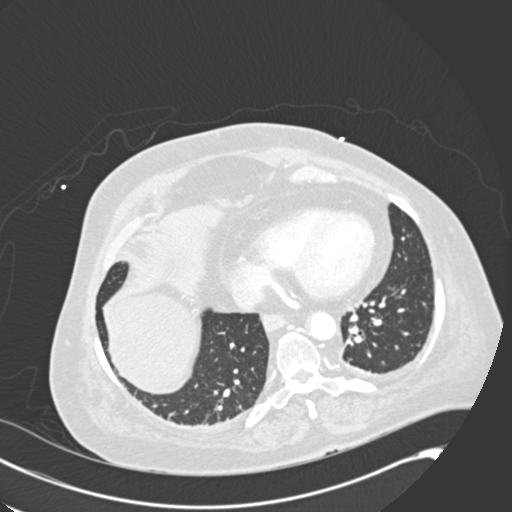
[im 136/303  mediastinal]
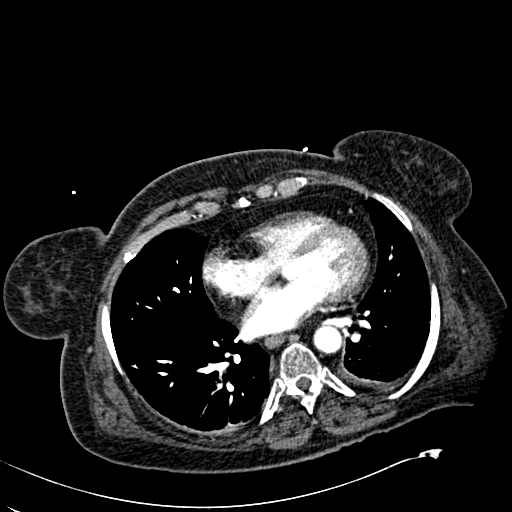
[im 142/303  lung]
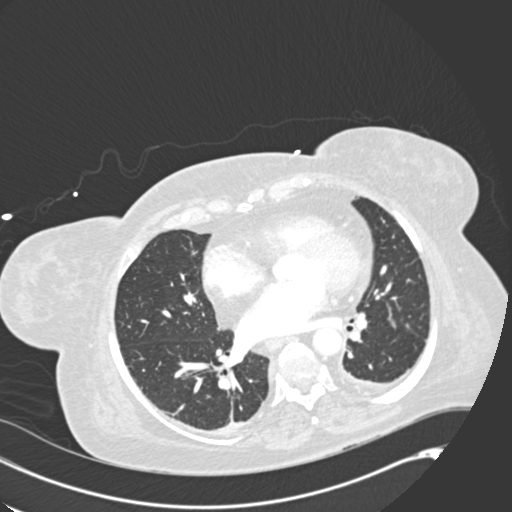
[im 152/303  mediastinal]
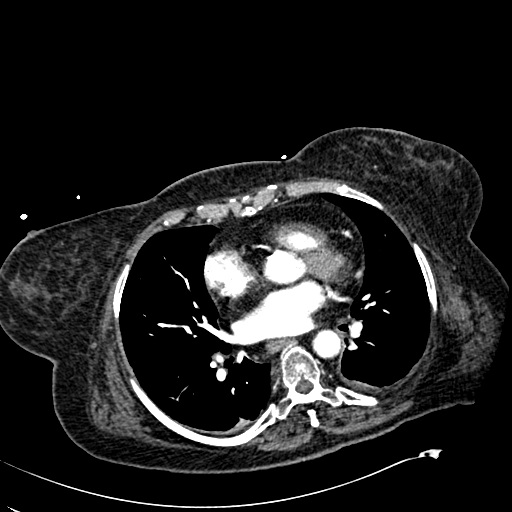
[im 167/303  lung]
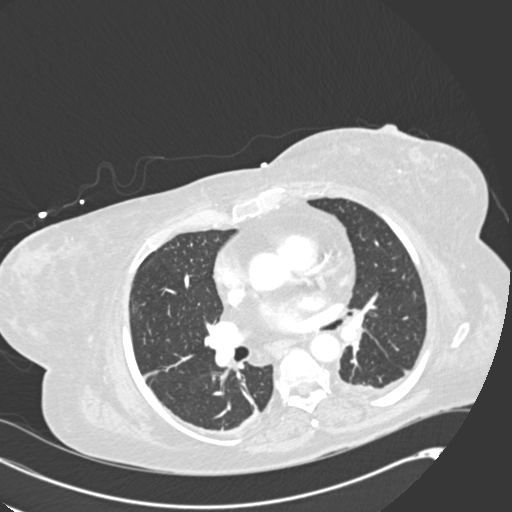
[im 197/303  mediastinal]
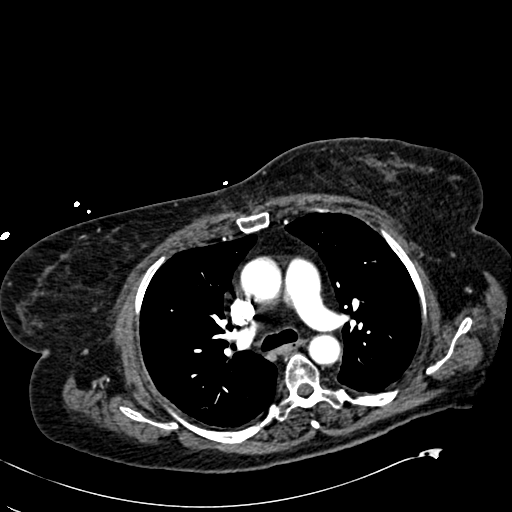
[im 202/303  lung]
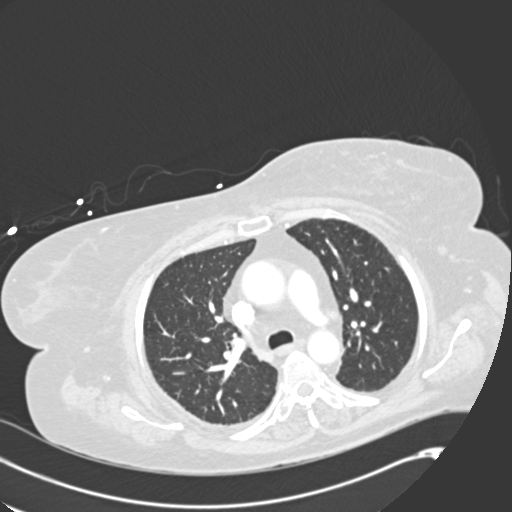
[im 212/303  mediastinal]
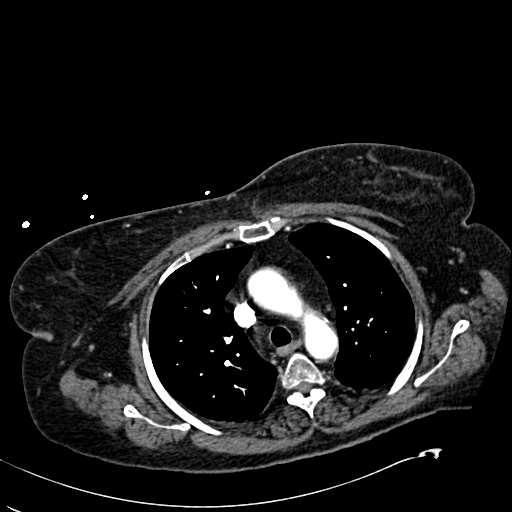
[im 227/303  lung]
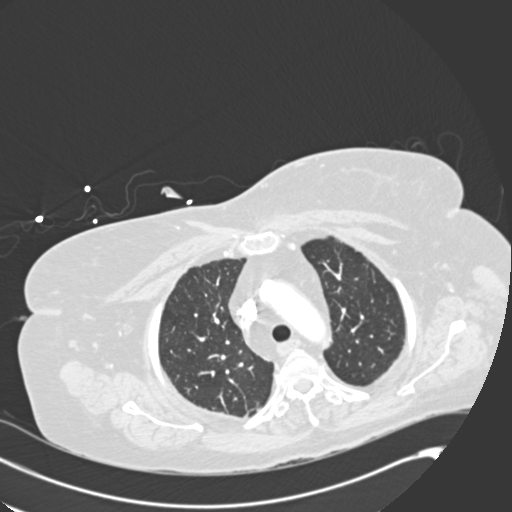
[im 242/303  mediastinal]
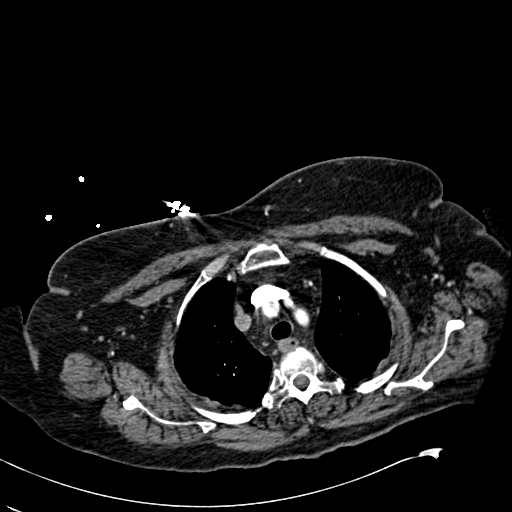
[im 272/303  lung]
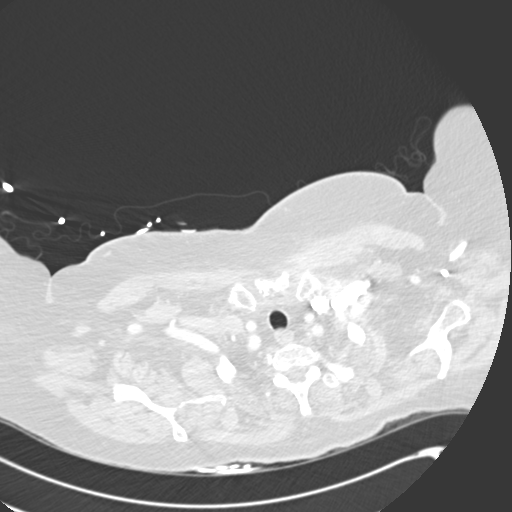
[im 287/303  mediastinal]
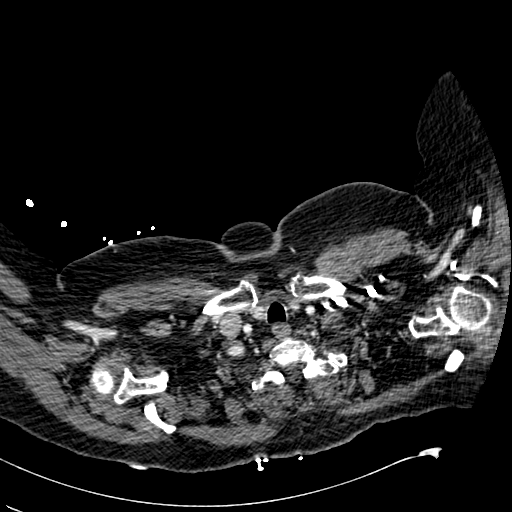

[18 of 32 positions shown; findings below may reference images not displayed]

FINDINGS: Mediastinum/Lymph Nodes: Negative for pulmonary embolism. Normal
caliber of the thoracic aorta without dissection. Mild
atherosclerotic plaque at the aortic arch. Great vessels are patent.
No significant chest lymphadenopathy. There are coronary artery
calcifications. No significant pericardial fluid.

Lungs/Pleura: Trachea and mainstem bronchi are patent. Again noted
are a few dependent and basilar lung densities which are similar to
the previous examination and probably represent a combination of
scarring and atelectasis. There is a 5 mm subtle density in the
right upper lobe. This nodular structure has a flat configuration on
the coronal and sagittal reformats. In addition, there is probably a
small focus of calcium within this structure on based on the soft
tissue windows. This structure is present on sequence 7, image 36.
There is also punctate nodular density along the right minor fissure
which is stable on sequence 7, image 44. Stable 5 mm nodular density
in the right lower lobe superior segment on sequence 7, image 47.
There is mild pleural thickening, particularly on left side. No
significant pleural effusions.

Upper abdomen: Low-attenuation of the liver is suggestive for
hepatic steatosis. No acute abnormalities in the upper abdomen.

Musculoskeletal: No acute bone abnormality.

Review of the MIP images confirms the above findings.
IMPRESSION: No acute chest abnormality. Negative for a pulmonary embolism. No
evidence for an aortic dissection.

Small pulmonary nodules as described. No significantly changed since
04/17/2015. At least 1 of these nodules may represent a calcified
granuloma. Largest nodule measures up to 5 mm. If the patient is at
high risk for bronchogenic carcinoma, follow-up chest CT at 6-12
months is recommended. If the patient is at low risk for
bronchogenic carcinoma, follow-up chest CT at 12 months is
recommended. This recommendation follows the consensus statement:
Guidelines for Management of Small Pulmonary Nodules Detected on CT
Scans: A Statement from the [HOSPITAL] as published in

## 2018-01-18 IMAGING — CR DG CHEST 1V PORT
1 series · 1 of 1 positions shown · non-contrast
Comparison: 08/10/2015 and prior exams

CLINICAL DATA: 69-year-old female with acute shortness of breath
today.

EXAM:
PORTABLE CHEST 1 VIEW

[x chest ap]
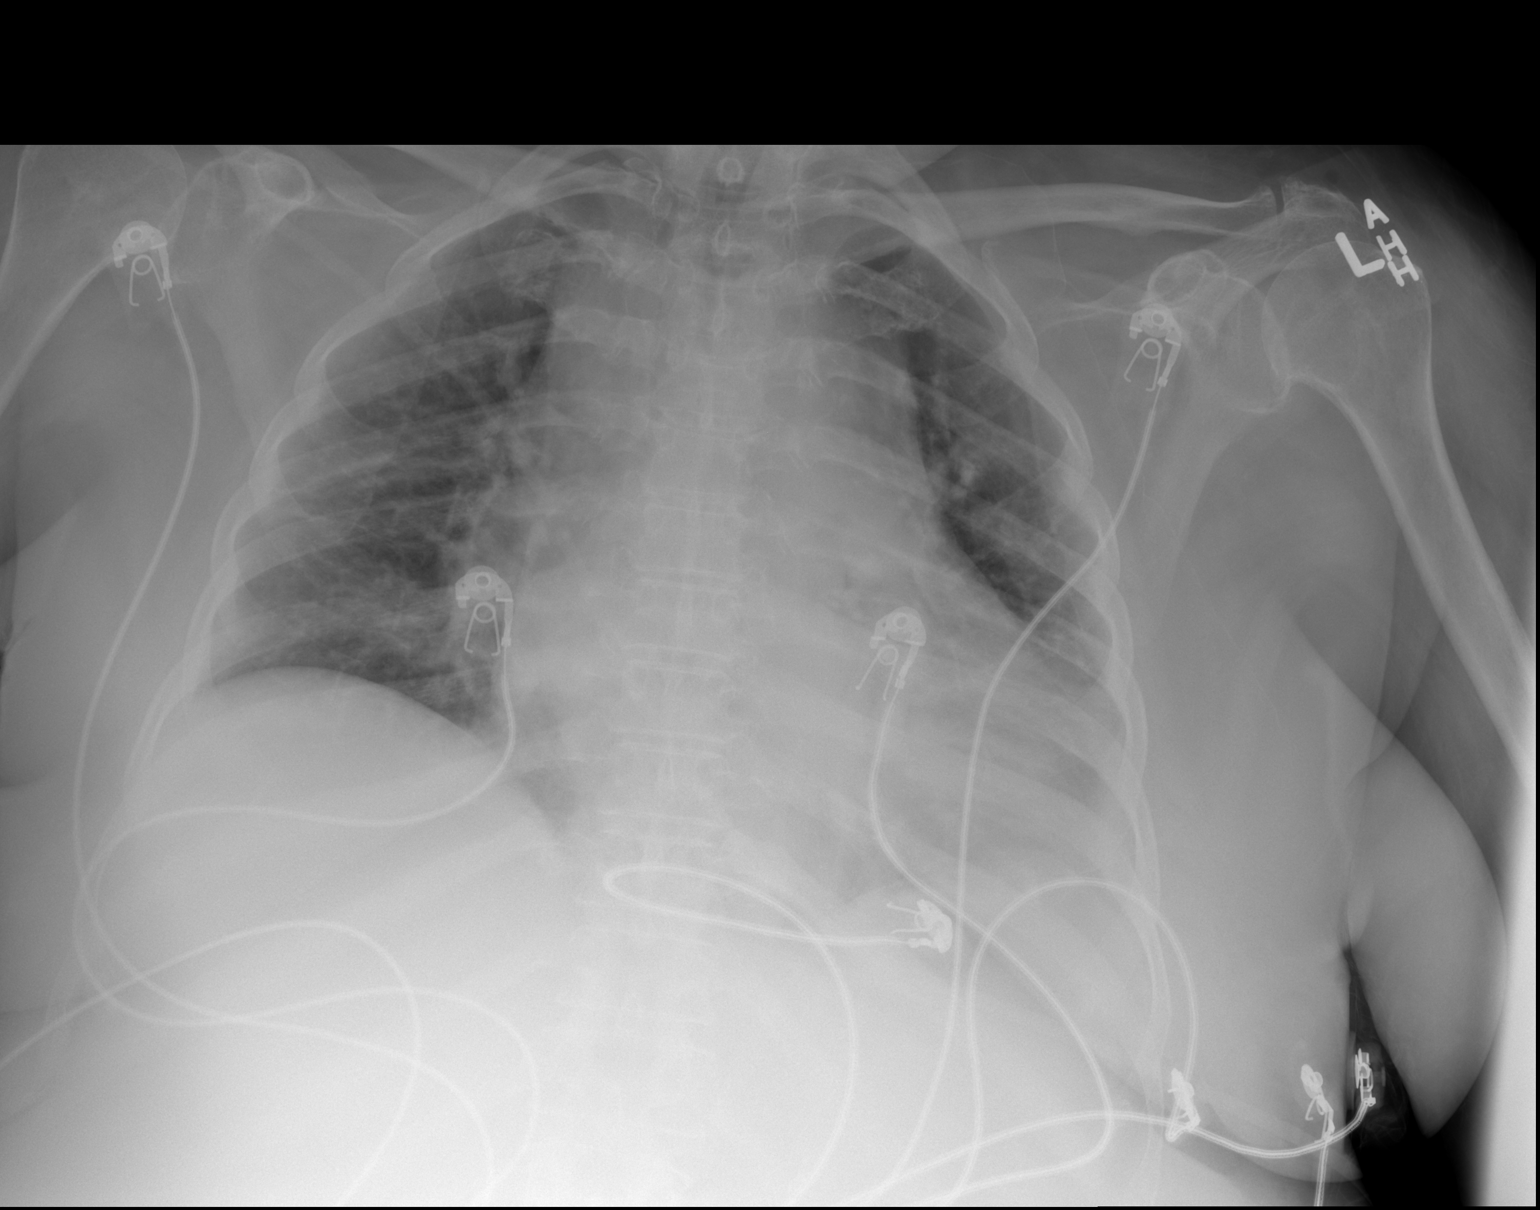

[1 of 1 positions shown; findings below may reference images not displayed]

FINDINGS: This is a low volume film.

Cardiomegaly and mild pulmonary vascular congestion noted.

Mild elevation the right hemidiaphragm is again noted.

Minimal bibasilar atelectasis is unchanged.

There is no evidence of pneumothorax or definite pleural effusion.

No acute bony abnormalities are noted.
IMPRESSION: Low volume film with cardiomegaly, mild pulmonary vascular
congestion and minimal bibasilar atelectasis.

## 2018-03-12 IMAGING — CR DG ABDOMEN 1V
2 series · 2 of 2 positions shown · non-contrast
Comparison: CT 08/09/2015

CLINICAL DATA: Diffuse abdominal pain and distention, onset today.

EXAM:
ABDOMEN - 1 VIEW

[t abdomen supine (1 of 2)]
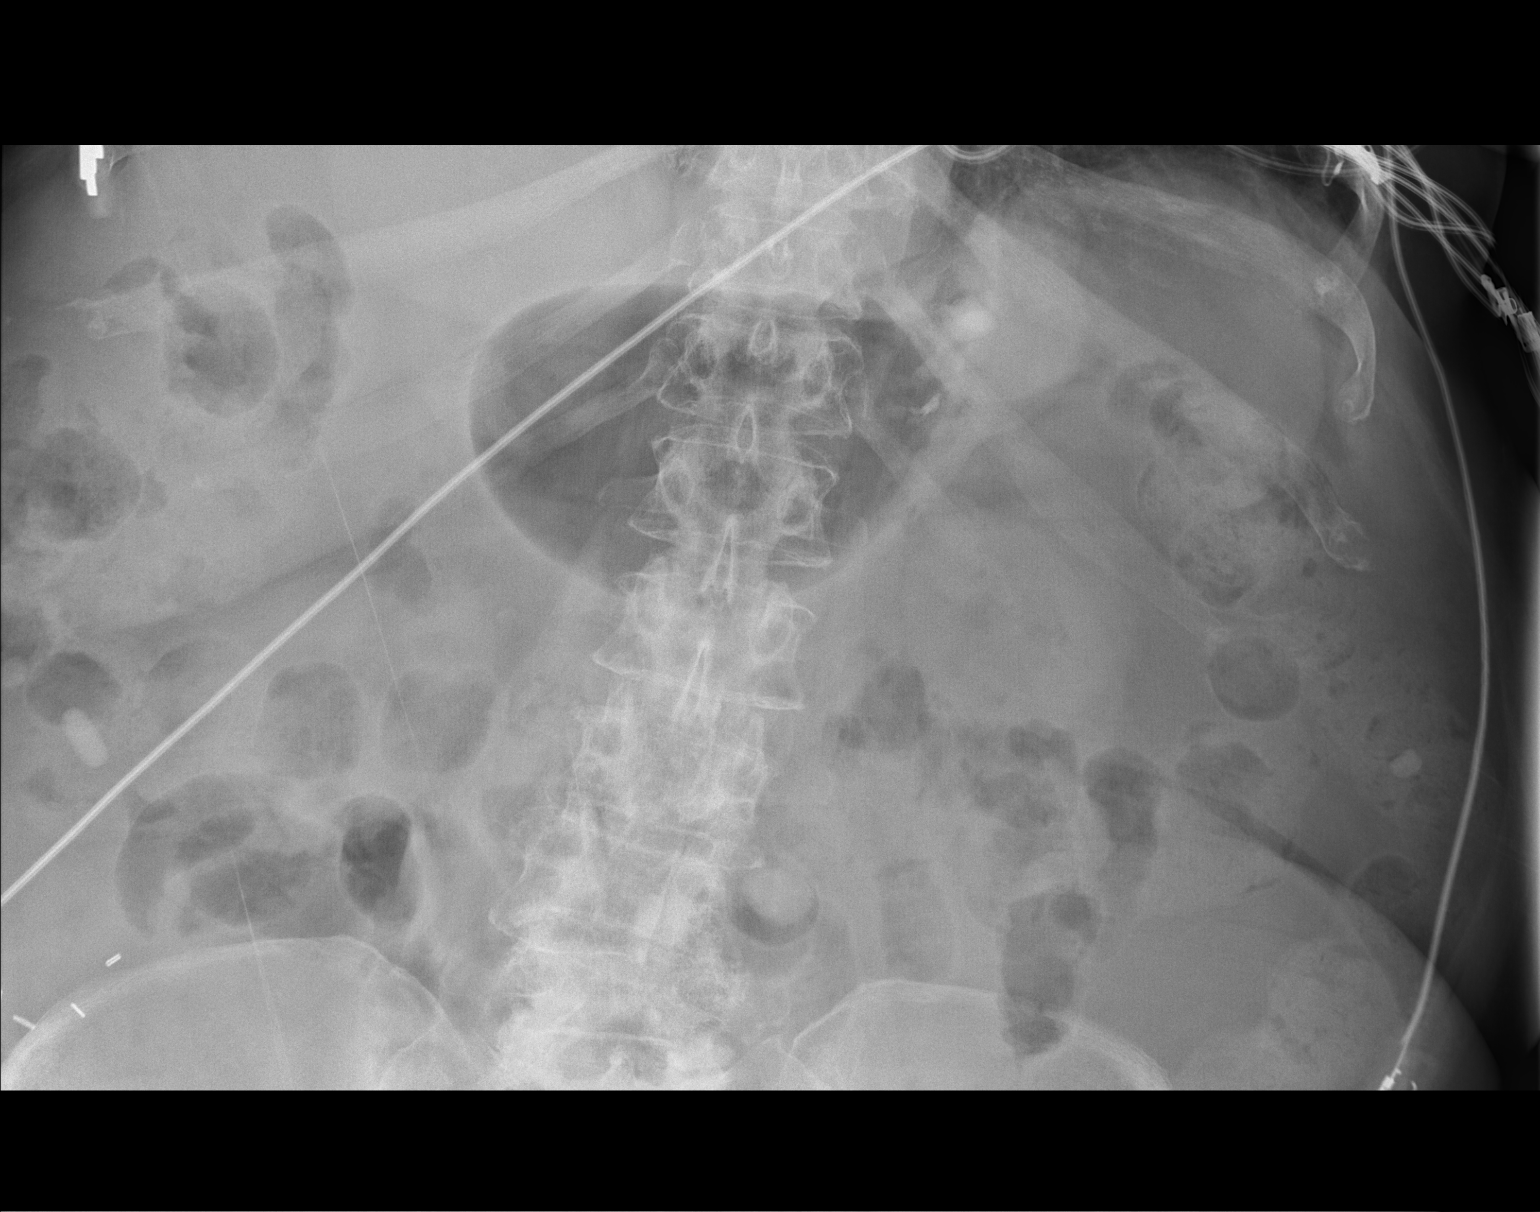

[t abdomen supine (2 of 2)]
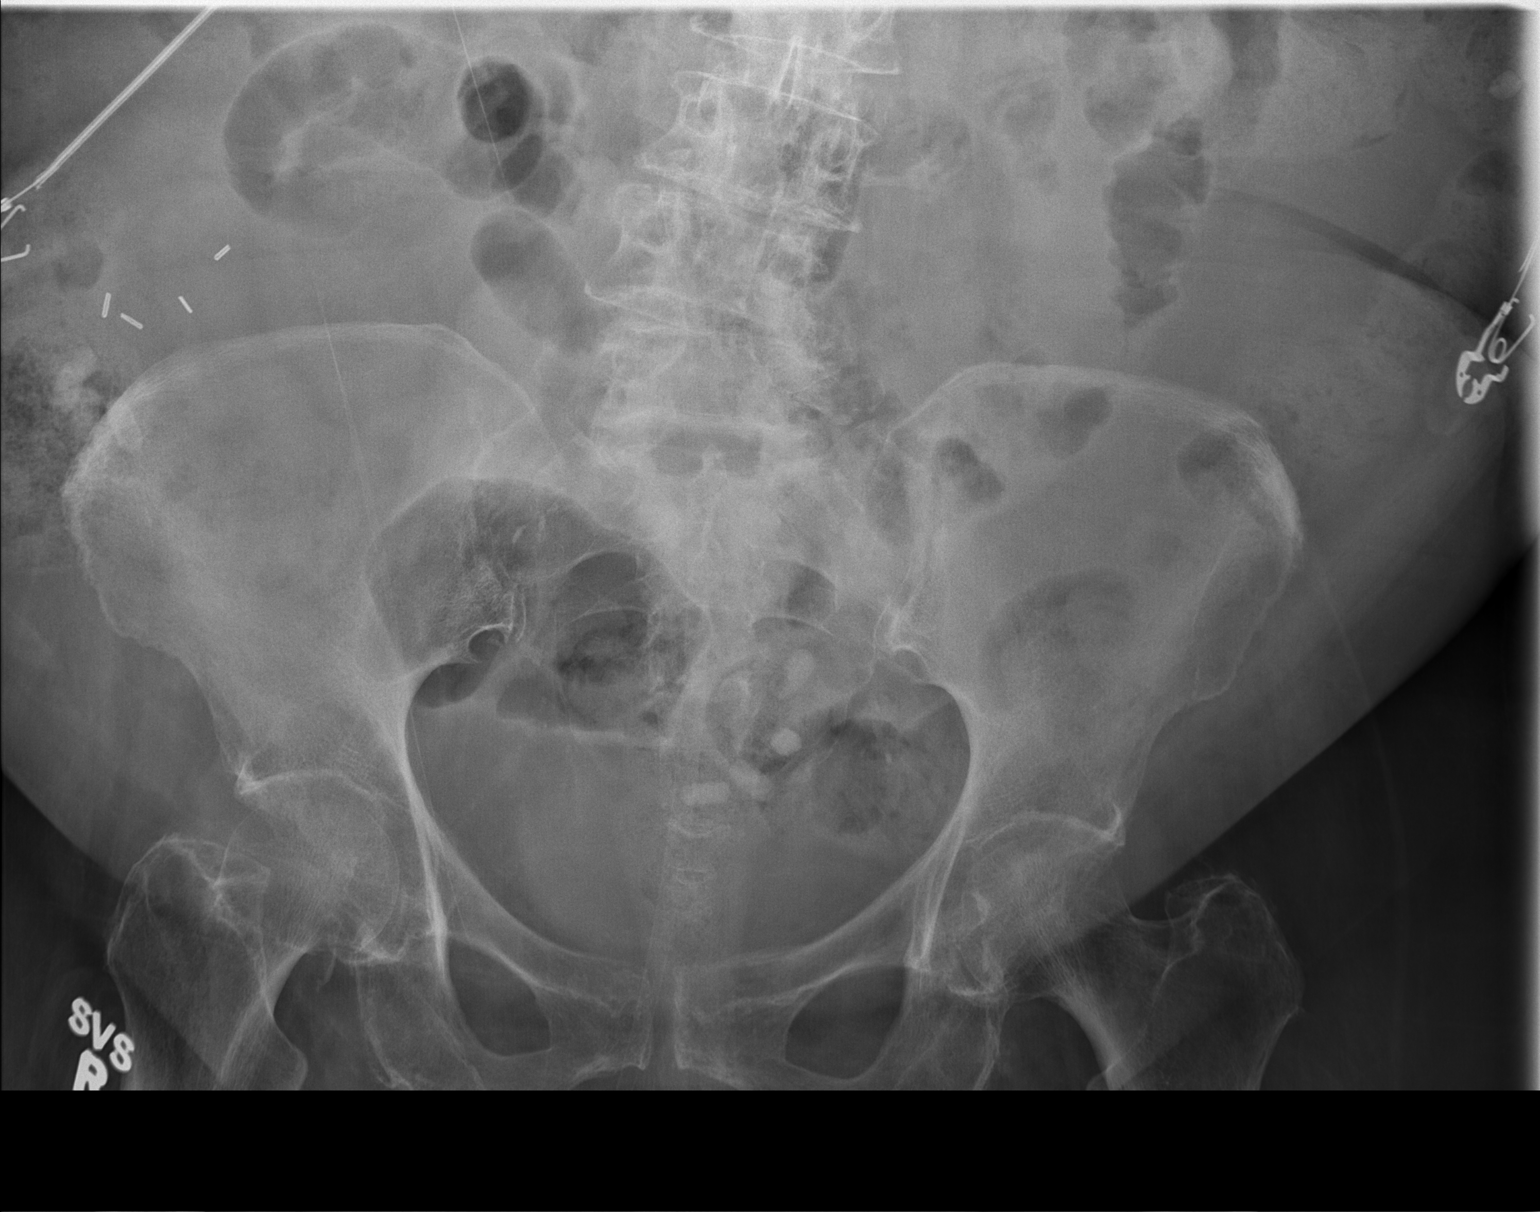

[2 of 2 positions shown; findings below may reference images not displayed]

FINDINGS: The bowel gas pattern is normal. Moderate stool burden. Enteric
contrast within diverticula in the left colon. Oblong densities in
the colon are likely related to pills. Portions of the right abdomen
excluded from the field of view. No evidence of free air on supine
view. Surgical clips in the right abdomen. There is degenerative
change in the spine.
IMPRESSION: Normal bowel gas pattern.  Moderate stool burden.
